# Patient Record
Sex: Male | Born: 1945 | Race: White | Hispanic: No | Smoking: Former smoker
Health system: Southern US, Community
[De-identification: ages and names within clinical notes are randomized; demographics above are authoritative.]

## PROBLEM LIST (undated history)

## (undated) DIAGNOSIS — E785 Hyperlipidemia, unspecified: Secondary | ICD-10-CM

## (undated) DIAGNOSIS — J309 Allergic rhinitis, unspecified: Secondary | ICD-10-CM

## (undated) DIAGNOSIS — Z9981 Dependence on supplemental oxygen: Secondary | ICD-10-CM

## (undated) DIAGNOSIS — M159 Polyosteoarthritis, unspecified: Secondary | ICD-10-CM

## (undated) DIAGNOSIS — I739 Peripheral vascular disease, unspecified: Secondary | ICD-10-CM

## (undated) DIAGNOSIS — J449 Chronic obstructive pulmonary disease, unspecified: Secondary | ICD-10-CM

## (undated) DIAGNOSIS — I10 Essential (primary) hypertension: Secondary | ICD-10-CM

## (undated) DIAGNOSIS — I2510 Atherosclerotic heart disease of native coronary artery without angina pectoris: Secondary | ICD-10-CM

## (undated) DIAGNOSIS — I509 Heart failure, unspecified: Secondary | ICD-10-CM

## (undated) HISTORY — DX: Chronic obstructive pulmonary disease, unspecified: J44.9

## (undated) HISTORY — DX: Polyosteoarthritis, unspecified: M15.9

## (undated) HISTORY — DX: Hyperlipidemia, unspecified: E78.5

## (undated) HISTORY — DX: Atherosclerotic heart disease of native coronary artery without angina pectoris: I25.10

## (undated) HISTORY — DX: Peripheral vascular disease, unspecified: I73.9

## (undated) HISTORY — PX: TONSILLECTOMY AND ADENOIDECTOMY: SUR1326

## (undated) HISTORY — DX: Allergic rhinitis, unspecified: J30.9

## (undated) HISTORY — DX: Essential (primary) hypertension: I10

---

## 1998-04-02 ENCOUNTER — Other Ambulatory Visit: Admission: RE | Admit: 1998-04-02 | Discharge: 1998-04-02 | Payer: Self-pay | Admitting: Internal Medicine

## 1998-07-09 ENCOUNTER — Other Ambulatory Visit: Admission: RE | Admit: 1998-07-09 | Discharge: 1998-07-09 | Payer: Self-pay | Admitting: Family Medicine

## 1998-08-06 ENCOUNTER — Ambulatory Visit (HOSPITAL_COMMUNITY): Admission: RE | Admit: 1998-08-06 | Discharge: 1998-08-06 | Payer: Self-pay | Admitting: Family Medicine

## 2002-04-08 ENCOUNTER — Emergency Department (HOSPITAL_COMMUNITY): Admission: EM | Admit: 2002-04-08 | Discharge: 2002-04-08 | Payer: Self-pay | Admitting: Emergency Medicine

## 2002-05-07 ENCOUNTER — Ambulatory Visit (HOSPITAL_COMMUNITY): Admission: RE | Admit: 2002-05-07 | Discharge: 2002-05-07 | Payer: Self-pay | Admitting: Family Medicine

## 2004-09-07 ENCOUNTER — Ambulatory Visit: Payer: Self-pay | Admitting: *Deleted

## 2004-09-09 ENCOUNTER — Ambulatory Visit: Payer: Self-pay | Admitting: Family Medicine

## 2004-09-09 ENCOUNTER — Ambulatory Visit: Payer: Self-pay | Admitting: *Deleted

## 2004-10-05 ENCOUNTER — Ambulatory Visit: Payer: Self-pay | Admitting: Family Medicine

## 2004-11-03 ENCOUNTER — Ambulatory Visit: Payer: Self-pay | Admitting: Family Medicine

## 2009-10-01 ENCOUNTER — Ambulatory Visit: Payer: Self-pay | Admitting: Family Medicine

## 2009-10-01 DIAGNOSIS — J449 Chronic obstructive pulmonary disease, unspecified: Secondary | ICD-10-CM

## 2009-10-01 DIAGNOSIS — E785 Hyperlipidemia, unspecified: Secondary | ICD-10-CM

## 2009-10-01 DIAGNOSIS — I2510 Atherosclerotic heart disease of native coronary artery without angina pectoris: Secondary | ICD-10-CM

## 2009-10-01 DIAGNOSIS — I779 Disorder of arteries and arterioles, unspecified: Secondary | ICD-10-CM

## 2009-10-01 DIAGNOSIS — J4489 Other specified chronic obstructive pulmonary disease: Secondary | ICD-10-CM

## 2009-10-01 DIAGNOSIS — I10 Essential (primary) hypertension: Secondary | ICD-10-CM

## 2009-10-01 DIAGNOSIS — I739 Peripheral vascular disease, unspecified: Secondary | ICD-10-CM

## 2009-10-01 HISTORY — DX: Chronic obstructive pulmonary disease, unspecified: J44.9

## 2009-10-01 HISTORY — DX: Essential (primary) hypertension: I10

## 2009-10-01 HISTORY — DX: Hyperlipidemia, unspecified: E78.5

## 2009-10-01 HISTORY — DX: Peripheral vascular disease, unspecified: I73.9

## 2009-10-01 HISTORY — DX: Other specified chronic obstructive pulmonary disease: J44.89

## 2009-10-01 HISTORY — DX: Atherosclerotic heart disease of native coronary artery without angina pectoris: I25.10

## 2009-12-24 ENCOUNTER — Ambulatory Visit: Payer: Self-pay | Admitting: Family Medicine

## 2009-12-24 DIAGNOSIS — M159 Polyosteoarthritis, unspecified: Secondary | ICD-10-CM

## 2009-12-24 DIAGNOSIS — J309 Allergic rhinitis, unspecified: Secondary | ICD-10-CM

## 2009-12-24 HISTORY — DX: Polyosteoarthritis, unspecified: M15.9

## 2009-12-24 HISTORY — DX: Allergic rhinitis, unspecified: J30.9

## 2009-12-25 LAB — CONVERTED CEMR LAB
ALT: 22 units/L (ref 0–53)
BUN: 6 mg/dL (ref 6–23)
Bilirubin, Direct: 0.1 mg/dL (ref 0.0–0.3)
CO2: 29 meq/L (ref 19–32)
Chloride: 102 meq/L (ref 96–112)
GFR calc non Af Amer: 89.77 mL/min (ref >60)
Glucose, Bld: 98 mg/dL (ref 70–99)
HDL: 41.6 mg/dL (ref >39.00)
Potassium: 4.1 meq/L (ref 3.5–5.1)
Sodium: 137 meq/L (ref 135–145)
Total Bilirubin: 1 mg/dL (ref 0.3–1.2)
Total CHOL/HDL Ratio: 4
VLDL: 30.6 mg/dL (ref 0.0–40.0)

## 2010-01-28 ENCOUNTER — Ambulatory Visit: Payer: Self-pay | Admitting: Family Medicine

## 2010-01-28 DIAGNOSIS — R05 Cough: Secondary | ICD-10-CM

## 2010-01-30 ENCOUNTER — Ambulatory Visit: Payer: Self-pay | Admitting: Family Medicine

## 2010-02-20 ENCOUNTER — Ambulatory Visit: Payer: Self-pay | Admitting: Family Medicine

## 2010-08-12 ENCOUNTER — Telehealth: Payer: Self-pay | Admitting: Family Medicine

## 2010-12-20 HISTORY — PX: FRACTURE SURGERY: SHX138

## 2010-12-23 ENCOUNTER — Other Ambulatory Visit: Payer: Self-pay | Admitting: Family Medicine

## 2010-12-23 ENCOUNTER — Ambulatory Visit
Admission: RE | Admit: 2010-12-23 | Discharge: 2010-12-23 | Payer: Self-pay | Source: Home / Self Care | Attending: Family Medicine | Admitting: Family Medicine

## 2010-12-23 DIAGNOSIS — R634 Abnormal weight loss: Secondary | ICD-10-CM | POA: Insufficient documentation

## 2010-12-23 LAB — CBC WITH DIFFERENTIAL/PLATELET
Basophils Absolute: 0 10*3/uL (ref 0.0–0.1)
Basophils Relative: 0.5 % (ref 0.0–3.0)
Eosinophils Absolute: 0.3 10*3/uL (ref 0.0–0.7)
Eosinophils Relative: 3.3 % (ref 0.0–5.0)
HCT: 45.6 % (ref 39.0–52.0)
Hemoglobin: 15.7 g/dL (ref 13.0–17.0)
Lymphocytes Relative: 21.8 % (ref 12.0–46.0)
Lymphs Abs: 2.2 10*3/uL (ref 0.7–4.0)
MCHC: 34.3 g/dL (ref 30.0–36.0)
MCV: 97.3 fl (ref 78.0–100.0)
Monocytes Absolute: 1 10*3/uL (ref 0.1–1.0)
Monocytes Relative: 9.8 % (ref 3.0–12.0)
Neutro Abs: 6.5 10*3/uL (ref 1.4–7.7)
Neutrophils Relative %: 64.6 % (ref 43.0–77.0)
Platelets: 185 10*3/uL (ref 150.0–400.0)
RBC: 4.69 Mil/uL (ref 4.22–5.81)
RDW: 13.8 % (ref 11.5–14.6)
WBC: 10.1 10*3/uL (ref 4.5–10.5)

## 2010-12-23 LAB — HEPATIC FUNCTION PANEL
ALT: 16 U/L (ref 0–53)
AST: 24 U/L (ref 0–37)
Albumin: 3.9 g/dL (ref 3.5–5.2)
Alkaline Phosphatase: 75 U/L (ref 39–117)
Bilirubin, Direct: 0.1 mg/dL (ref 0.0–0.3)
Total Bilirubin: 0.9 mg/dL (ref 0.3–1.2)
Total Protein: 7.3 g/dL (ref 6.0–8.3)

## 2010-12-23 LAB — LIPID PANEL
Cholesterol: 138 mg/dL (ref 0–200)
HDL: 49.2 mg/dL (ref >39.00)
LDL Cholesterol: 75 mg/dL (ref 0–99)
Total CHOL/HDL Ratio: 3
Triglycerides: 68 mg/dL (ref 0.0–149.0)
VLDL: 13.6 mg/dL (ref 0.0–40.0)

## 2010-12-23 LAB — CONVERTED CEMR LAB
Blood in Urine, dipstick: NEGATIVE
Cholesterol, target level: 200 mg/dL
HDL goal, serum: 40 mg/dL
Protein, U semiquant: NEGATIVE
Urobilinogen, UA: 0.2
WBC Urine, dipstick: NEGATIVE

## 2010-12-23 LAB — BASIC METABOLIC PANEL
BUN: 8 mg/dL (ref 6–23)
CO2: 29 mEq/L (ref 19–32)
Calcium: 9.3 mg/dL (ref 8.4–10.5)
Chloride: 92 mEq/L — ABNORMAL LOW (ref 96–112)
Creatinine, Ser: 0.8 mg/dL (ref 0.4–1.5)
GFR: 105.57 mL/min (ref >60.00)
Glucose, Bld: 93 mg/dL (ref 70–99)
Potassium: 4.5 mEq/L (ref 3.5–5.1)
Sodium: 130 mEq/L — ABNORMAL LOW (ref 135–145)

## 2010-12-23 LAB — SEDIMENTATION RATE: Sed Rate: 8 mm/hr (ref 0–22)

## 2010-12-23 LAB — TSH: TSH: 1.77 u[IU]/mL (ref 0.35–5.50)

## 2011-01-11 ENCOUNTER — Encounter: Payer: Self-pay | Admitting: Family Medicine

## 2011-01-16 ENCOUNTER — Encounter: Payer: Self-pay | Admitting: Family Medicine

## 2011-01-21 NOTE — Assessment & Plan Note (Signed)
Summary: still coughing/PT RESCD//CCM   Vital Signs:  Patient profile:   65 year old male Temp:     98.6 degrees F oral BP sitting:   138 / 80  (left arm) Cuff size:   regular  Vitals Entered By: Sid Falcon LPN (February 20, 1609 1:38 PM) CC: Bil ears ringing, ongoing cough   History of Present Illness: Patient seen with some persistent cough though moderately improved since last visit. Recent chest x-ray showed COPD changes but no acute changes. There is some clear sinus drainage not improved with Zyrtec. Occasional sneezing. No eye symptoms. No recent nose bleeds. Has used Flonase in the past without improvement. Still smokes. Takes Advair regularly. Has some persistent wheezing and dyspnea with activity which is chronic.  Allergies (verified): No Known Drug Allergies  Past History:  Past Medical History: Last updated: 12/24/2009 Heart disease COPD Hyperlipidemia Hypertension Osteoarthritis multiple joints PMH reviewed for relevance  Review of Systems  The patient denies anorexia, fever, weight loss, chest pain, syncope, peripheral edema, hemoptysis, melena, hematochezia, and severe indigestion/heartburn.    Physical Exam  General:  Well-developed,well-nourished,in no acute distress; alert,appropriate and cooperative throughout examination Ears:  External ear exam shows no significant lesions or deformities.  Otoscopic examination reveals clear canals, tympanic membranes are intact bilaterally without bulging, retraction, inflammation or discharge. Hearing is grossly normal bilaterally. Nose:  clear rhinorrhea noted Mouth:  Oral mucosa and oropharynx without lesions or exudates.  Teeth in good repair. Neck:  No deformities, masses, or tenderness noted. Lungs:  diminished breath sounds throughout. He has some diffuse wheezes. No rales. No retractions. Heart:  normal rate and regular rhythm.   Extremities:  no edema   Impression & Recommendations:  Problem # 1:  COPD  (ICD-496) add Spiriva one inhalation daily-samples given and instructed in use. Again discussed discontinuation of lisinopril but patient is convinced cough is significantly improved. Continue Advair. Reassess one month. His updated medication list for this problem includes:    Ventolin Hfa 108 (90 Base) Mcg/act Aers (Albuterol sulfate) .Marland Kitchen... 1-2 puffs every 4 hours as needed    Advair Diskus 500-50 Mcg/dose Aepb (Fluticasone-salmeterol) ..... One dose by mouth every 12 hours  Complete Medication List: 1)  Ventolin Hfa 108 (90 Base) Mcg/act Aers (Albuterol sulfate) .Marland Kitchen.. 1-2 puffs every 4 hours as needed 2)  Advair Diskus 500-50 Mcg/dose Aepb (Fluticasone-salmeterol) .... One dose by mouth every 12 hours 3)  Simvastatin 80 Mg Tabs (Simvastatin) .... Once daily 4)  Prazosin Hcl 1 Mg Caps (Prazosin hcl) .... Two times a day 5)  Atenolol 50 Mg Tabs (Atenolol) .... Two times a day 6)  Hydrocodone-acetaminophen 7.5-500 Mg Tabs (Hydrocodone-acetaminophen) .... One by mouth q 6 hours as needed severe pain 7)  Fish Oil 300 Mg Caps (Omega-3 fatty acids) .... 2 caps daily 8)  Aspirin 81 Mg Tabs (Aspirin) .... Once daily 9)  Lisinopril-hydrochlorothiazide 10-12.5 Mg Tabs (Lisinopril-hydrochlorothiazide) .... One by mouth once daily 10)  Zyrtec Allergy 10 Mg Tabs (Cetirizine hcl) .... One by mouth at bedtime  Patient Instructions: 1)  takes Spiriva one inhalation daily 2)  Please schedule a follow-up appointment in 1 month.

## 2011-01-21 NOTE — Assessment & Plan Note (Signed)
Summary: FU ON MEDS/NJR   Vital Signs:  Patient profile:   65 year old male Weight:      133 pounds BMI:     21.22 Temp:     98.2 degrees F oral BP sitting:   120 / 70  (left arm) Cuff size:   regular  Vitals Entered By: Sid Falcon LPN (December 23, 2010 11:16 AM)  History of Present Illness: here for medical follow up.  Some weight loss with loss of appetite since last visit. Chronic cough.  No hempoptysis.  Known COPD and chronic dyspnea with exertion.   No pain.  Low grade nausea.  No change in bowel habits. Denies depression. Compliant with all meds.    New problem of rash on penis.  Present for several weeks   Nontender.  Hypertension History:      He denies headache, chest pain, palpitations, orthopnea, PND, peripheral edema, neurologic problems, syncope, and side effects from treatment.  He notes no problems with any antihypertensive medication side effects.        Positive major cardiovascular risk factors include male age 66 years old or older, hyperlipidemia, hypertension, and current tobacco user.  Negative major cardiovascular risk factors include no history of diabetes.        Positive history for target organ damage include ASHD (either angina/prior MI/prior CABG) and peripheral vascular disease.  Further assessment for target organ damage reveals no history of stroke/TIA.    Lipid Management History:      Positive NCEP/ATP III risk factors include male age 40 years old or older, current tobacco user, hypertension, ASHD (either angina/prior MI/prior CABG), and peripheral vascular disease.  Negative NCEP/ATP III risk factors include non-diabetic, no prior stroke/TIA, and no history of aortic aneurysm.      Allergies (verified): No Known Drug Allergies  Past History:  Past Medical History: Last updated: 12/24/2009 Heart disease COPD Hyperlipidemia Hypertension Osteoarthritis multiple joints  Family History: Last updated: 10/01/2009 Family history heart  disease  Social History: Last updated: 10/01/2009 Retired Divorced Current Smoker, one pk/day Alcohol use-yes  Risk Factors: Smoking Status: current (01/28/2010) PMH-FH-SH reviewed for relevance  Review of Systems       The patient complains of anorexia and weight loss.  The patient denies fever, hoarseness, chest pain, syncope, dyspnea on exertion, peripheral edema, prolonged cough, headaches, hemoptysis, abdominal pain, melena, hematochezia, severe indigestion/heartburn, hematuria, depression, and enlarged lymph nodes.    Physical Exam  General:  alert thin cooperatve Head:  normocephalic and atraumatic.   Eyes:  pupils equal, pupils round, and pupils reactive to light.   Ears:  External ear exam shows no significant lesions or deformities.  Otoscopic examination reveals clear canals, tympanic membranes are intact bilaterally without bulging, retraction, inflammation or discharge. Hearing is grossly normal bilaterally. Mouth:  Oral mucosa and oropharynx without lesions or exudates.  Teeth in good repair. Neck:  No deformities, masses, or tenderness noted. Lungs:  decreased breath sounds but no asymmetry.  No rales. Heart:  normal rate and regular rhythm.   Abdomen:  soft, non-tender, normal bowel sounds, no distention, no masses, no hepatomegaly, and no splenomegaly.   Extremities:  no edema. Neurologic:  alert & oriented X3 and cranial nerves II-XII intact.   Cervical Nodes:  No lymphadenopathy noted Psych:  normally interactive, good eye contact, not anxious appearing, and not depressed appearing.     Impression & Recommendations:  Problem # 1:  WEIGHT LOSS (ICD-783.21) ?related to his COPD.  Needs further eval  with labs and CXR. Orders: UA Dipstick w/o Micro (automated)  (81003) T-2 View CXR (71020TC) TLB-BMP (Basic Metabolic Panel-BMET) (80048-METABOL) TLB-CBC Platelet - w/Differential (85025-CBCD) TLB-TSH (Thyroid Stimulating Hormone) (84443-TSH) TLB-Sedimentation  Rate (ESR) (85652-ESR)  Problem # 2:  CAD (ICD-414.00)  His updated medication list for this problem includes:    Prazosin Hcl 1 Mg Caps (Prazosin hcl) .Marland Kitchen..Marland Kitchen Two times a day    Atenolol 50 Mg Tabs (Atenolol) .Marland Kitchen..Marland Kitchen Two times a day    Aspirin 81 Mg Tabs (Aspirin) ..... Once daily    Lisinopril-hydrochlorothiazide 10-12.5 Mg Tabs (Lisinopril-hydrochlorothiazide) ..... One by mouth once daily  Problem # 3:  HYPERTENSION (ICD-401.9)  His updated medication list for this problem includes:    Prazosin Hcl 1 Mg Caps (Prazosin hcl) .Marland Kitchen..Marland Kitchen Two times a day    Atenolol 50 Mg Tabs (Atenolol) .Marland Kitchen..Marland Kitchen Two times a day    Lisinopril-hydrochlorothiazide 10-12.5 Mg Tabs (Lisinopril-hydrochlorothiazide) ..... One by mouth once daily  Problem # 4:  HYPERLIPIDEMIA (ICD-272.4)  His updated medication list for this problem includes:    Simvastatin 80 Mg Tabs (Simvastatin) ..... Once daily  Orders: TLB-Lipid Panel (80061-LIPID) TLB-Hepatic/Liver Function Pnl (80076-HEPATIC)  Problem # 5:  COPD (ICD-496)  His updated medication list for this problem includes:    Proair Hfa 108 (90 Base) Mcg/act Aers (Albuterol sulfate) .Marland Kitchen... 2 puffs q 4 hours as needed cough and wheeze    Advair Diskus 500-50 Mcg/dose Aepb (Fluticasone-salmeterol) ..... One dose by mouth every 12 hours  Complete Medication List: 1)  Proair Hfa 108 (90 Base) Mcg/act Aers (Albuterol sulfate) .... 2 puffs q 4 hours as needed cough and wheeze 2)  Advair Diskus 500-50 Mcg/dose Aepb (Fluticasone-salmeterol) .... One dose by mouth every 12 hours 3)  Simvastatin 80 Mg Tabs (Simvastatin) .... Once daily 4)  Prazosin Hcl 1 Mg Caps (Prazosin hcl) .... Two times a day 5)  Atenolol 50 Mg Tabs (Atenolol) .... Two times a day 6)  Hydrocodone-acetaminophen 7.5-500 Mg Tabs (Hydrocodone-acetaminophen) .... One by mouth q 6 hours as needed severe pain 7)  Fish Oil 300 Mg Caps (Omega-3 fatty acids) .... 2 caps daily 8)  Aspirin 81 Mg Tabs (Aspirin) .... Once  daily 9)  Lisinopril-hydrochlorothiazide 10-12.5 Mg Tabs (Lisinopril-hydrochlorothiazide) .... One by mouth once daily 10)  Zyrtec Allergy 10 Mg Tabs (Cetirizine hcl) .... One by mouth at bedtime 11)  Triamcinolone Acetonide 0.1 % Crea (Triamcinolone acetonide) .... Apply to affected rash two times a day for 2 weeks.  Hypertension Assessment/Plan:      The patient's hypertensive risk group is category C: Target organ damage and/or diabetes.  Today's blood pressure is 120/70.    Lipid Assessment/Plan:      Based on NCEP/ATP III, the patient's risk factor category is "history of coronary disease, peripheral vascular disease, cerebrovascular disease, or aortic aneurysm along with either diabetes, current smoker, or LDL > 130 plus HDL < 40 plus triglycerides > 200".  The patient's lipid goals are as follows: Total cholesterol goal is 200; LDL cholesterol goal is 70; HDL cholesterol goal is 40; Triglyceride goal is 150.    Patient Instructions: 1)  Please schedule a follow-up appointment in 1 month.  Prescriptions: LISINOPRIL-HYDROCHLOROTHIAZIDE 10-12.5 MG TABS (LISINOPRIL-HYDROCHLOROTHIAZIDE) one by mouth once daily  #90 x 3   Entered and Authorized by:   Evelena Peat MD   Signed by:   Evelena Peat MD on 12/23/2010   Method used:   Electronically to        [facility]  [address] [phone]* (retail)       [phone]       [place], [place]  [address]       Ph: [phone]       Fax: [phone]   RxID:   [id number] ATENOLOL 50 MG TABS (ATENOLOL) two times a day  #180 x 3   Entered and Authorized by:   Evelena Peat MD   Signed by:   Evelena Peat MD on 12/23/2010   Method used:   Electronically to        [facility]. [phone]* (retail)       [phone] [address]       [place], [place]  [address]       Ph: [phone]       Fax: [phone]   RxID:   [id number] PRAZOSIN HCL 1 MG CAPS (PRAZOSIN HCL) two times a  day  #180 x 3   Entered and Authorized by:   Evelena Peat MD   Signed by:   Evelena Peat MD on 12/23/2010   Method used:   Electronically to        [facility]. [phone]* (retail)       [phone] [address]       [place], [place]  [address]       Ph: [phone]       Fax: [phone]   RxID:   [id number] SIMVASTATIN 80 MG TABS (SIMVASTATIN) once daily  #90 x 3   Entered and Authorized by:   Evelena Peat MD   Signed by:   Evelena Peat MD on 12/23/2010   Method used:   Electronically to        [facility]. [phone]* (retail)       [phone] [address]       [place], [place]  [address]       Ph: [phone]       Fax: [phone]   RxID:   [id number] ADVAIR DISKUS 500-50 MCG/DOSE AEPB (FLUTICASONE-SALMETEROL) one dose by mouth every 12 hours  #3 x 3   Entered and Authorized by:   Evelena Peat MD   Signed by:   Evelena Peat MD on 12/23/2010   Method used:   Electronically to        [facility]. [phone]* (retail)       [phone]       [place], [place]  [address]       Ph: [phone]       Fax: [phone]   RxID:   [id number] PROAIR HFA 108 (90 BASE) MCG/ACT AERS (ALBUTEROL SULFATE) 2 puffs q 4 hours as needed cough and wheeze  #1 x 11   Entered and Authorized by:   Evelena Peat MD   Signed by:   Evelena Peat MD on 12/23/2010   Method used:   Electronically to        [facility]. [phone]* (retail)       [phone] [address]       [place], [place]  [address]       Ph: [phone]       Fax: [phone]   RxID:   [id number] HYDROCODONE-ACETAMINOPHEN 7.5-500  MG TABS (HYDROCODONE-ACETAMINOPHEN) one by mouth q 6 hours as needed severe pain  #60 x 0   Entered and Authorized by:   Evelena Peat MD   Signed by:   Evelena Peat MD on 12/23/2010   Method used:   Print then Give to Patient   RxID:    [id number] TRIAMCINOLONE ACETONIDE 0.1 % CREA (TRIAMCINOLONE ACETONIDE) apply to affected rash two times a day for 2 weeks.  #15 gm x 1   Entered and Authorized by:   Evelena Peat MD   Signed by:   Evelena Peat MD on 12/23/2010   Method used:   Print then Give to Patient   RxID:   [id number]    Orders Added: 1)  UA Dipstick w/o Micro (automated)  [81003] 2)  T-2 View CXR [71020TC] 3)  TLB-Lipid Panel [80061-LIPID] 4)  TLB-Hepatic/Liver Function Pnl [80076-HEPATIC] 5)  TLB-BMP (Basic Metabolic Panel-BMET) [80048-METABOL] 6)  TLB-CBC Platelet - w/Differential [85025-CBCD] 7)  TLB-TSH (Thyroid Stimulating Hormone) [84443-TSH] 8)  TLB-Sedimentation Rate (ESR) [85652-ESR] 9)  Est. Patient Level IV [95[address]]    Laboratory Results   Urine Tests    Routine Urinalysis   Color: yellow Appearance: Clear Glucose: negative   (Normal Range: Negative) Bilirubin: negative   (Normal Range: Negative) Ketone: negative   (Normal Range: Negative) Spec. Gravity: 1.015   (Normal Range: 1.003-1.035) Blood: negative   (Normal Range: Negative) pH: 7.5   (Normal Range: 5.0-8.0) Protein: negative   (Normal Range: Negative) Urobilinogen: 0.2   (Normal Range: 0-1) Nitrite: negative   (Normal Range: Negative) Leukocyte Esterace: negative   (Normal Range: Negative)    Comments: Rita Ohara  December 23, 2010 3:35 PM

## 2011-01-21 NOTE — Assessment & Plan Note (Signed)
Summary: ROA/FUP/RCD   Vital Signs:  Patient profile:   65 year old male Weight:      149 pounds O2 Sat:      94 % on Room air Temp:     98.7 degrees F oral Pulse rate:   73 / minute Pulse rhythm:   regular BP sitting:   150 / 88  (left arm) Cuff size:   regular  Vitals Entered By: Sid Falcon LPN (January 28, 2010 1:13 PM)  O2 Flow:  Room air CC: ROV follow-up, coughing   History of Present Illness: Patient seen today for the following items.  Follow up hypertension. Recent poor control. Add low-dose lisinopril. Patient has a chronic cough which is unchanged. Was coughing well before lisinopril added. No orthostatic symptoms. Not checking blood pressure at home.  Hyperlipidemia. Recent lipids reveal fairly good control. These are reviewed with patient today. No side effect from simvastatin.  Persistent cough for several months. History of COPD. Cough productive of clear sputum. Denies any pleuritic pain, hemoptysis, appetite changes, or weight changes. Chronic dyspnea on exertion unchanged. Is aware of some wheezing off and on. Using Advair regularly  Preventive Screening-Counseling & Management  Alcohol-Tobacco     Smoking Status: current  Allergies (verified): No Known Drug Allergies  Past History:  Past Medical History: Last updated: 12/24/2009 Heart disease COPD Hyperlipidemia Hypertension Osteoarthritis multiple joints  Social History: Last updated: 10/01/2009 Retired Divorced Current Smoker, one pk/day Alcohol use-yes PMH-FH-SH reviewed for relevance  Review of Systems       The patient complains of dyspnea on exertion and prolonged cough.  The patient denies anorexia, fever, weight loss, hoarseness, chest pain, syncope, peripheral edema, headaches, hemoptysis, abdominal pain, melena, hematochezia, and severe indigestion/heartburn.    Physical Exam  General:  Well-developed,well-nourished,in no acute distress; alert,appropriate and cooperative  throughout examination Ears:  External ear exam shows no significant lesions or deformities.  Otoscopic examination reveals clear canals, tympanic membranes are intact bilaterally without bulging, retraction, inflammation or discharge. Hearing is grossly normal bilaterally. Mouth:  Oral mucosa and oropharynx without lesions or exudates.  Teeth in good repair. Neck:  No deformities, masses, or tenderness noted. Lungs:  somewhat diminishedd breath sounds throughout. Faint wheezes. No rales. Symmetric breath sounds. Heart:  normal rate and regular rhythm.   Extremities:  no edema   Impression & Recommendations:  Problem # 1:  HYPERTENSION (ICD-401.9) Assessment Improved improved but not quite to goal. Change to lisinopril HCTZ His updated medication list for this problem includes:    Prazosin Hcl 1 Mg Caps (Prazosin hcl) .Marland Kitchen..Marland Kitchen Two times a day    Atenolol 50 Mg Tabs (Atenolol) .Marland Kitchen..Marland Kitchen Two times a day    Lisinopril-hydrochlorothiazide 10-12.5 Mg Tabs (Lisinopril-hydrochlorothiazide) ..... One by mouth once daily  Problem # 2:  COUGH, CHRONIC (ICD-786.2) Assessment: Deteriorated  Probably related to chronic bronchitis. Obtain chest x-ray with his smoking status.start Avelox 400 mg daily for 7 days.  Consider d/c ACE if cough worsens/persists.  Orders: T-2 View CXR (71020TC)  Problem # 3:  HYPERLIPIDEMIA (ICD-272.4) Assessment: Unchanged  His updated medication list for this problem includes:    Simvastatin 80 Mg Tabs (Simvastatin) ..... Once daily  Complete Medication List: 1)  Ventolin Hfa 108 (90 Base) Mcg/act Aers (Albuterol sulfate) .Marland Kitchen.. 1-2 puffs every 4 hours as needed 2)  Advair Diskus 500-50 Mcg/dose Aepb (Fluticasone-salmeterol) .... One dose by mouth every 12 hours 3)  Simvastatin 80 Mg Tabs (Simvastatin) .... Once daily 4)  Prazosin Hcl 1  Mg Caps (Prazosin hcl) .... Two times a day 5)  Atenolol 50 Mg Tabs (Atenolol) .... Two times a day 6)  Hydrocodone-acetaminophen 7.5-500  Mg Tabs (Hydrocodone-acetaminophen) .... One by mouth q 6 hours as needed severe pain 7)  Fish Oil 300 Mg Caps (Omega-3 fatty acids) .... 2 caps daily 8)  Aspirin 81 Mg Tabs (Aspirin) .... Once daily 9)  Lisinopril-hydrochlorothiazide 10-12.5 Mg Tabs (Lisinopril-hydrochlorothiazide) .... One by mouth once daily 10)  Zyrtec Allergy 10 Mg Tabs (Cetirizine hcl) .... One by mouth at bedtime  Patient Instructions: 1)  Please schedule a follow-up appointment in 2 months.  2)  call or be in touch in 2 weeks if cough is not improving following antibiotics Prescriptions: LISINOPRIL-HYDROCHLOROTHIAZIDE 10-12.5 MG TABS (LISINOPRIL-HYDROCHLOROTHIAZIDE) one by mouth once daily  #30 x 11   Entered and Authorized by:   Evelena Peat MD   Signed by:   Evelena Peat MD on 01/28/2010   Method used:   Electronically to        [facility]. [phone]* (retail)       [phone]       [place], [place]  [address]       Ph: [phone]       Fax: [phone]   RxID:   [id number]

## 2011-01-21 NOTE — Assessment & Plan Note (Signed)
Summary: 3 mo rov/mm   Vital Signs:  Patient profile:   65 year old male Weight:      150 pounds Temp:     98.7 degrees F oral BP sitting:   160 / 80  (left arm) Cuff size:   regular  Vitals Entered By: Sid Falcon LPN (December 24, 2009 1:21 PM)  History of Present Illness: Patient here for several following items.  He relates postnasal drip and clear nasal mucus discharge for several months. Occasional sneezing. No eye symptoms. No clear precipitants. Has not tried any antihistamines.  Ongoing cigarette use. Has almost daily cough which is unchanged. Frequent clear mucous early mornings. He uses Advair regularly. Still smoking over half-pack cigarettes per day. No interest in quitting. P.r.n. use of Ventolin. Denies any appetite or weight changes no hemoptysis.  Patient hyperlipidemia and due for lipids. Compliant with medications. Poor compliance with lifestyle. He has refused cardiac workup in the past for exertional shortness of breath and occasional chest symptoms.  Patient request increase milligrams a pain medication which takes infrequently for flareups of severe osteoarthritis.  Allergies (verified): No Known Drug Allergies  Past History:  Social History: Last updated: 10/01/2009 Retired Divorced Current Smoker, one pk/day Alcohol use-yes  Past Medical History: Heart disease COPD Hyperlipidemia Hypertension Osteoarthritis multiple joints  Review of Systems       The patient complains of dyspnea on exertion.  The patient denies anorexia, fever, weight loss, chest pain, syncope, peripheral edema, headaches, hemoptysis, abdominal pain, melena, and hematochezia.    Physical Exam  General:  Well-developed,well-nourished,in no acute distress; alert,appropriate and cooperative throughout examination Eyes:  No corneal or conjunctival inflammation noted. EOMI. Perrla. Funduscopic exam benign, without hemorrhages, exudates or papilledema. Vision grossly  normal. Ears:  External ear exam shows no significant lesions or deformities.  Otoscopic examination reveals clear canals, tympanic membranes are intact bilaterally without bulging, retraction, inflammation or discharge. Hearing is grossly normal bilaterally. Nose:  clear mucus otherwise clear Mouth:  Oral mucosa and oropharynx without lesions or exudates.  Teeth in good repair. Neck:  No deformities, masses, or tenderness noted. Lungs:  diminished breath sounds throughout with some scattered wheezes. No rales Heart:  normal rate, regular rhythm, and no murmur.   Extremities:  No clubbing, cyanosis, edema, or deformity noted with normal full range of motion of all joints.     Impression & Recommendations:  Problem # 1:  ALLERGIC RHINITIS (ICD-477.9) try over-the-counter Claritin or Zyrtec.  Problem # 2:  COPD (ICD-496) Assessment: New low motivation to quit smoking. Encouraged to quit. May benefit from spiRiva addition to medications if cost not an issue. His updated medication list for this problem includes:    Ventolin Hfa 108 (90 Base) Mcg/act Aers (Albuterol sulfate) .Marland Kitchen... 1-2 puffs every 4 hours as needed    Advair Diskus 500-50 Mcg/dose Aepb (Fluticasone-salmeterol) ..... One dose by mouth every 12 hours  Problem # 3:  HYPERTENSION (ICD-401.9)  poorly controlled. Add low-dose ACE inhibitor His updated medication list for this problem includes:    Prazosin Hcl 1 Mg Caps (Prazosin hcl) .Marland Kitchen..Marland Kitchen Two times a day    Atenolol 50 Mg Tabs (Atenolol) .Marland Kitchen..Marland Kitchen Two times a day    Lisinopril 10 Mg Tabs (Lisinopril) ..... One by mouth once daily  Orders: Venipuncture (16109) TLB-BMP (Basic Metabolic Panel-BMET) (80048-METABOL)  Problem # 4:  HYPERLIPIDEMIA (ICD-272.4)  His updated medication list for this problem includes:    Simvastatin 80 Mg Tabs (Simvastatin) ..... 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 One by mouth q 6 hours as needed severe pain    Aspirin 81 Mg Tabs (Aspirin) ..... Once daily  Complete Medication List: 1)  Ventolin Hfa 108 (90 Base) Mcg/act Aers (Albuterol sulfate) .Marland Kitchen.. 1-2 puffs every 4 hours as needed 2)  Advair Diskus 500-50 Mcg/dose Aepb (Fluticasone-salmeterol) .... One dose by mouth every 12 hours 3)  Simvastatin 80 Mg Tabs (Simvastatin) .... Once daily 4)  Prazosin Hcl 1 Mg Caps (Prazosin hcl) .... Two times a day 5)  Atenolol 50 Mg Tabs (Atenolol) .... Two times a day 6)  Hydrocodone-acetaminophen 7.5-500 Mg Tabs (Hydrocodone-acetaminophen) .... One by mouth q 6 hours as needed severe pain 7)  Fish Oil 300 Mg Caps (Omega-3 fatty acids) .... 2 caps daily 8)  Aspirin 81 Mg Tabs (Aspirin) .... Once daily 9)  Lisinopril 10 Mg Tabs (Lisinopril) .... One by mouth once daily  Patient Instructions: 1)  Try over-the-counter Claritin or Zyrtec for symptomatic relief of nasal congestion 2)  Please schedule a follow-up appointment in 1 month.  Prescriptions: VENTOLIN HFA 108 (90 BASE) MCG/ACT AERS (ALBUTEROL SULFATE) 1-2 puffs every 4 hours as needed  #1 x 3   Entered and Authorized by:   Evelena Peat MD   Signed by:   Evelena Peat MD on 12/24/2009   Method used:   Electronically to        [facility]. [phone]* (retail)       [phone]       [place], [place]  [address]       Ph: [phone]       Fax: [phone]   RxID:   [id number] ATENOLOL 50 MG TABS (ATENOLOL) two times a day  #180 x 3   Entered and Authorized by:   Evelena Peat MD   Signed by:   Evelena Peat MD on 12/24/2009   Method used:   Electronically to        [facility]. [phone]* (retail)       [phone] [address]       [place], [place]  [address]       Ph: [phone]       Fax: [phone]   RxID:   [id number] PRAZOSIN HCL 1 MG CAPS (PRAZOSIN HCL) two times a day  #180 x 3   Entered and Authorized by:   Evelena Peat MD   Signed by:   Evelena Peat MD on 12/24/2009   Method used:   Electronically to        [facility]. [phone]* (retail)       [phone] [address]       [place], [place]  [address]       Ph: [phone]       Fax: [phone]   RxID:   [id number] SIMVASTATIN 80 MG TABS (SIMVASTATIN) once daily  #90 x 3   Entered and Authorized by:   Evelena Peat MD   Signed by:   Evelena Peat MD on 12/24/2009   Method used:   Electronically to  [facility]. [phone]* (retail)       [phone] [address]       [place], [place]  [address]       Ph: [phone]       Fax: [phone]   RxID:   [id number] ADVAIR DISKUS 500-50 MCG/DOSE AEPB (FLUTICASONE-SALMETEROL) one dose by mouth every 12 hours  #3 x 3   Entered and Authorized by:   Evelena Peat MD   Signed by:   Evelena Peat MD on 12/24/2009   Method used:   Electronically to        [facility]. [phone]* (retail)       [phone] [address]       [place], [place]  [address]       Ph: [phone]       Fax: [phone]   RxID:   [id number] HYDROCODONE-ACETAMINOPHEN 7.5-500 MG TABS (HYDROCODONE-ACETAMINOPHEN) one by mouth q 6 hours as needed severe pain  #60 x 0   Entered and Authorized by:   Evelena Peat MD   Signed by:   Evelena Peat MD on 12/24/2009   Method used:   Print then Give to Patient   RxID:   [id number] LISINOPRIL 10 MG TABS (LISINOPRIL) one by mouth once daily  #30 x 5   Entered and Authorized by:   Evelena Peat MD   Signed by:   Evelena Peat MD on 12/24/2009   Method used:   Electronically to         [facility]. [phone]* (retail)       [phone] [address]       [place], [place]  [address]       Ph: [phone]       Fax: [phone]   RxID:   [id number]

## 2011-01-21 NOTE — Progress Notes (Signed)
Summary: rx  [employer] patient assistance program  Phone Note Call from Patient Call back at Home Phone [phone]   Caller: Patient Call For: Douglas Peat MD Summary of Call: pt needs needs advair 500-50 micrograms #3 with 3 refills pt will pick up rx please call ot. Initial call taken by: Heron Sabins,  August 12, 2010 2:48 PM  Follow-up for Phone Call        doesn't he want [place] to send in to pharmacy? Follow-up by: Douglas Peat MD,  August 12, 2010 4:08 PM  Additional Follow-up for Phone Call Additional follow up Details #1::        No he will pick up rx to seen to pt assistant program       Additional Follow-up by: Heron Sabins,  August 12, 2010 4:34 PM    Additional Follow-up for Phone Call Additional follow up Details #2::    will refill Follow-up by: Douglas Peat MD,  August 13, 2010 8:09 AM  Additional Follow-up for Phone Call Additional follow up Details #3:: Details for Additional Follow-up Action Taken: Pt informed ready for pick-up on VM Additional Follow-up by: Sid Falcon LPN,  August 13, 2010 8:51 AM  Prescriptions: ADVAIR DISKUS 500-50 MCG/DOSE AEPB (FLUTICASONE-SALMETEROL) one dose by mouth every 12 hours  #3 x 3   Entered and Authorized by:   Douglas Peat MD   Signed by:   Douglas Peat MD on 08/13/2010   Method used:   Print then Give to Patient   RxID:   [id number]

## 2011-01-25 ENCOUNTER — Encounter: Payer: Self-pay | Admitting: Family Medicine

## 2011-01-25 ENCOUNTER — Ambulatory Visit (INDEPENDENT_AMBULATORY_CARE_PROVIDER_SITE_OTHER): Payer: PRIVATE HEALTH INSURANCE | Admitting: Family Medicine

## 2011-01-25 DIAGNOSIS — E871 Hypo-osmolality and hyponatremia: Secondary | ICD-10-CM

## 2011-01-25 DIAGNOSIS — R634 Abnormal weight loss: Secondary | ICD-10-CM

## 2011-01-25 DIAGNOSIS — I10 Essential (primary) hypertension: Secondary | ICD-10-CM

## 2011-01-25 LAB — BASIC METABOLIC PANEL
CO2: 32 mEq/L (ref 19–32)
GFR: 110.43 mL/min (ref >60.00)
Glucose, Bld: 95 mg/dL (ref 70–99)
Potassium: 4.1 mEq/L (ref 3.5–5.1)
Sodium: 129 mEq/L — ABNORMAL LOW (ref 135–145)

## 2011-01-25 NOTE — Progress Notes (Signed)
  Subjective:    Patient ID: Douglas Mann, male    DOB: 1946-01-11, 64 y.o.   MRN: [id number]  HPI Patient seen for medical followup. Refer to prior note which was reviewed in detail. Patient had presented with weight loss. Recent studies included chest x-ray, sedimentation rate, thyroid studies, chemistries, Hepatic panel, CBC, and lipid panel. Labs were significant for sodium 130. Patient takes combination ACE inhibitor diuretic for hypertension.  Patient's weight has stabilized. Appetite is slightly improved since last visit. He is reluctant to do further studies at this time. Denies any depressive symptoms.  Hypertension treated with atenolol and lisinopril hydrochlorothiazide. Also takes Minipress 1 mg daily Compliant with all blood pressure medications.  Ongoing nicotine use. No motivation to quit smoking. Denies hemoptysis. COPD treated with Advair and albuterol as needed. He was previously on Spiriva but did not see improvement stopped this on his own   Review of Systems Patient denies a headaches, dysphagia, change in pulmonary status, hemoptysis, chest pain, abdominal pain, skin rash, change in stool habits, or , Change in urine habits.     Objective:   Physical Exam Patient is alert in no distress. Oropharynx no lesions  Eardrums normal  Pupils equal reactive to light  Neck no mass  Chest diminished breath sounds throughout. No rales or wheezes  Heart regular rhythm rate  Abdomen soft nontender without mass. No organomegaly  Extremities no edema  Neuro alert oriented x3. Cranial nerves all intact. Gait normal        Assessment & Plan:  #1  weight loss. This appears to stabilized. Recent studies unremarkable. Possibly related to advanced COPD. Patient reluctant to engage in further studies #2  recent hyponatremia probably related to diuretic use. Reassess basic metabolic panel #3 hypertension. Stable. No change in meds at this time

## 2011-01-25 NOTE — Patient Instructions (Signed)
Keep an eye on your weight. Follow up if still losing any weight over the next few weeks or months.

## 2011-01-26 ENCOUNTER — Telehealth: Payer: Self-pay | Admitting: *Deleted

## 2011-01-26 DIAGNOSIS — I10 Essential (primary) hypertension: Secondary | ICD-10-CM

## 2011-01-26 NOTE — Telephone Encounter (Signed)
Message copied by Sid Falcon on Tue Jan 26, 2011  4:23 PM ------      Message from: Trinna Post      Created: Tue Jan 26, 2011 12:39 PM       Sodium is low but stable. I would stop his lisinopril HCTZ and start lisinopril 20 mg daily and reassess BMP in 2 months.

## 2011-01-26 NOTE — Telephone Encounter (Signed)
LMTCB, needs med change and return labs

## 2011-01-27 MED ORDER — LISINOPRIL 20 MG PO TABS: 20.0000 mg | ORAL_TABLET | Freq: Every day | ORAL | Status: DC

## 2011-01-27 NOTE — Telephone Encounter (Signed)
Pt informed, he will D/C Lisinopril - HCTZ, new Lisinopril 20 sent to [facility]

## 2011-01-28 ENCOUNTER — Telehealth: Payer: Self-pay | Admitting: *Deleted

## 2011-01-28 NOTE — Telephone Encounter (Signed)
Pt informed, new Rx sent to pt pharmacy, labs scheduled

## 2011-01-28 NOTE — Telephone Encounter (Signed)
Message copied by Sid Falcon on Thu Jan 28, 2011  6:12 PM ------      Message from: Trinna Post      Created: Tue Jan 26, 2011 12:39 PM       Sodium is low but stable. I would stop his lisinopril HCTZ and start lisinopril 20 mg daily and reassess BMP in 2 months.

## 2011-07-26 ENCOUNTER — Ambulatory Visit (INDEPENDENT_AMBULATORY_CARE_PROVIDER_SITE_OTHER): Payer: PRIVATE HEALTH INSURANCE | Admitting: Family Medicine

## 2011-07-26 ENCOUNTER — Encounter: Payer: Self-pay | Admitting: Family Medicine

## 2011-07-26 VITALS — BP 150/80 | Temp 98.7°F | Wt 127.0 lb

## 2011-07-26 DIAGNOSIS — J449 Chronic obstructive pulmonary disease, unspecified: Secondary | ICD-10-CM

## 2011-07-26 DIAGNOSIS — I10 Essential (primary) hypertension: Secondary | ICD-10-CM

## 2011-07-26 MED ORDER — PREDNISONE 10 MG PO TABS: ORAL_TABLET | ORAL | Status: AC

## 2011-07-26 MED ORDER — ALBUTEROL SULFATE HFA 108 (90 BASE) MCG/ACT IN AERS: 2.0000 | INHALATION_SPRAY | Freq: Four times a day (QID) | RESPIRATORY_TRACT | Status: DC | PRN

## 2011-07-26 NOTE — Progress Notes (Signed)
  Subjective:    Patient ID: Douglas Mann, male    DOB: [birth year], 65 y.o.   MRN: [id number]  HPI Patient seen with progressive dyspnea over many months. Ongoing nicotine use. Known emphysema. No dyspnea at rest. More with ambulation. Takes Advair twice daily and Ventolin as needed. Previously used Spiriva but did not think this helped. He's had some gradual weight loss for several years.  Denies any productive cough, hemoptysis, abdominal pain, dysuria, or any stool changes.  chest x-ray last January showed emphysema changes. Several labs including hepatic function, chemistries, CBC, sedimentation rate, TSH which were normal. Pneumovax 2010.  Continue to smoke one pack cigarettes per day.  Hypertension and hyperlipidemia.  Compliant with all meds.  Past Medical History  Diagnosis Date  . HYPERLIPIDEMIA 10/01/2009  . HYPERTENSION 10/01/2009  . CAD 10/01/2009  . PVD 10/01/2009  . ALLERGIC RHINITIS 12/24/2009  . COPD 10/01/2009  . 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 COUGH, CHRONIC 01/28/2010   No past surgical history on file.  reports that he has been smoking Cigarettes.  He has a 1 pack-year smoking history. He does not have any smokeless tobacco history on file. He reports that he drinks alcohol. His drug history not on file. family history includes Heart disease in his other. No Known Allergies    Review of Systems  Constitutional: Positive for appetite change, fatigue and unexpected weight change. Negative for fever and chills.  HENT: Negative for sore throat and trouble swallowing.   Respiratory: Positive for cough, shortness of breath and wheezing. Negative for chest tightness.   Cardiovascular: Negative for chest pain, palpitations and leg swelling.  Gastrointestinal: Negative for vomiting, abdominal pain and diarrhea.  Genitourinary: Negative for dysuria.  Neurological: Negative for dizziness.  Hematological: Negative for adenopathy.       Objective:   Physical Exam  Constitutional: He is oriented to person, place, and time.  HENT:  Head: Normocephalic and atraumatic.  Right Ear: External ear normal.  Left Ear: External ear normal.  Mouth/Throat: Oropharynx is clear and moist.  Eyes: Pupils are equal, round, and reactive to light.  Neck: Neck supple. No thyromegaly present.  Cardiovascular: Normal rate and regular rhythm.   Pulmonary/Chest:       Somewhat diminished breath sounds throughout. He had diffuse wheezes. No rales  Musculoskeletal: He exhibits no edema.  Neurological: He is alert and oriented to person, place, and time.          Assessment & Plan:  #1 chronic obstructive pulmonary disease. Patient is again encouraged to stop smoking. He has scaled back but refuses to quit. Check home overnight pulse oximetry. Suspect he'll qualify for home oxygen. Refill Ventolin. Continue Advair. Discussed Spiriva which he did not see help with previously.  Recommend pulmonary referral and he wishes to wait at this time. Prednisone taper #2 weight loss. Possibly related to #1. Recent labs as above unremarkable.

## 2011-09-02 ENCOUNTER — Other Ambulatory Visit: Payer: Self-pay | Admitting: Family Medicine

## 2011-09-06 ENCOUNTER — Inpatient Hospital Stay (HOSPITAL_COMMUNITY)
Admission: EM | Admit: 2011-09-06 | Discharge: 2011-09-15 | DRG: 492 | Disposition: A | Discharge status: Home Health | Payer: No Typology Code available for payment source | Attending: General Surgery | Admitting: General Surgery

## 2011-09-06 ENCOUNTER — Emergency Department (HOSPITAL_COMMUNITY): Payer: No Typology Code available for payment source

## 2011-09-06 ENCOUNTER — Inpatient Hospital Stay (HOSPITAL_COMMUNITY): Payer: No Typology Code available for payment source

## 2011-09-06 DIAGNOSIS — F17200 Nicotine dependence, unspecified, uncomplicated: Secondary | ICD-10-CM | POA: Diagnosis present

## 2011-09-06 DIAGNOSIS — F1010 Alcohol abuse, uncomplicated: Secondary | ICD-10-CM | POA: Diagnosis present

## 2011-09-06 DIAGNOSIS — J4489 Other specified chronic obstructive pulmonary disease: Secondary | ICD-10-CM | POA: Diagnosis present

## 2011-09-06 DIAGNOSIS — I2510 Atherosclerotic heart disease of native coronary artery without angina pectoris: Secondary | ICD-10-CM | POA: Diagnosis present

## 2011-09-06 DIAGNOSIS — S82209B Unspecified fracture of shaft of unspecified tibia, initial encounter for open fracture type I or II: Secondary | ICD-10-CM

## 2011-09-06 DIAGNOSIS — I252 Old myocardial infarction: Secondary | ICD-10-CM

## 2011-09-06 DIAGNOSIS — Y92410 Unspecified street and highway as the place of occurrence of the external cause: Secondary | ICD-10-CM

## 2011-09-06 DIAGNOSIS — S82839B Other fracture of upper and lower end of unspecified fibula, initial encounter for open fracture type I or II: Secondary | ICD-10-CM | POA: Diagnosis present

## 2011-09-06 DIAGNOSIS — D62 Acute posthemorrhagic anemia: Secondary | ICD-10-CM | POA: Diagnosis not present

## 2011-09-06 DIAGNOSIS — Z9861 Coronary angioplasty status: Secondary | ICD-10-CM

## 2011-09-06 DIAGNOSIS — S82409B Unspecified fracture of shaft of unspecified fibula, initial encounter for open fracture type I or II: Secondary | ICD-10-CM

## 2011-09-06 DIAGNOSIS — Z7982 Long term (current) use of aspirin: Secondary | ICD-10-CM

## 2011-09-06 DIAGNOSIS — J449 Chronic obstructive pulmonary disease, unspecified: Secondary | ICD-10-CM | POA: Diagnosis present

## 2011-09-06 DIAGNOSIS — Z711 Person with feared health complaint in whom no diagnosis is made: Secondary | ICD-10-CM

## 2011-09-06 DIAGNOSIS — T07XXXA Unspecified multiple injuries, initial encounter: Secondary | ICD-10-CM

## 2011-09-06 DIAGNOSIS — Z79899 Other long term (current) drug therapy: Secondary | ICD-10-CM

## 2011-09-06 DIAGNOSIS — K219 Gastro-esophageal reflux disease without esophagitis: Secondary | ICD-10-CM | POA: Diagnosis present

## 2011-09-06 DIAGNOSIS — E785 Hyperlipidemia, unspecified: Secondary | ICD-10-CM | POA: Diagnosis present

## 2011-09-06 DIAGNOSIS — IMO0002 Reserved for concepts with insufficient information to code with codable children: Secondary | ICD-10-CM | POA: Diagnosis present

## 2011-09-06 DIAGNOSIS — Z7902 Long term (current) use of antithrombotics/antiplatelets: Secondary | ICD-10-CM

## 2011-09-06 DIAGNOSIS — I10 Essential (primary) hypertension: Secondary | ICD-10-CM | POA: Diagnosis present

## 2011-09-06 DIAGNOSIS — E871 Hypo-osmolality and hyponatremia: Secondary | ICD-10-CM | POA: Diagnosis not present

## 2011-09-06 DIAGNOSIS — I214 Non-ST elevation (NSTEMI) myocardial infarction: Secondary | ICD-10-CM | POA: Diagnosis not present

## 2011-09-06 DIAGNOSIS — Z96649 Presence of unspecified artificial hip joint: Secondary | ICD-10-CM

## 2011-09-06 LAB — COMPREHENSIVE METABOLIC PANEL
ALT: 13 U/L (ref 0–53)
AST: 18 U/L (ref 0–37)
Albumin: 3.5 g/dL (ref 3.5–5.2)
Alkaline Phosphatase: 86 U/L (ref 39–117)
CO2: 30 mEq/L (ref 19–32)
Chloride: 93 mEq/L — ABNORMAL LOW (ref 96–112)
GFR calc non Af Amer: >60 mL/min (ref >60)
Potassium: 4.3 mEq/L (ref 3.5–5.1)
Sodium: 130 mEq/L — ABNORMAL LOW (ref 135–145)
Total Bilirubin: 0.3 mg/dL (ref 0.3–1.2)

## 2011-09-06 LAB — DIFFERENTIAL
Basophils Absolute: 0 10*3/uL (ref 0.0–0.1)
Eosinophils Relative: 3 % (ref 0–5)
Lymphocytes Relative: 25 % (ref 12–46)
Lymphs Abs: 2.3 10*3/uL (ref 0.7–4.0)
Neutro Abs: 6 10*3/uL (ref 1.7–7.7)

## 2011-09-06 LAB — CBC
HCT: 41.3 % (ref 39.0–52.0)
Hemoglobin: 14.5 g/dL (ref 13.0–17.0)
MCV: 92.4 fL (ref 78.0–100.0)
RBC: 4.47 MIL/uL (ref 4.22–5.81)
RDW: 13.1 % (ref 11.5–15.5)
WBC: 9.5 10*3/uL (ref 4.0–10.5)

## 2011-09-06 LAB — TYPE AND SCREEN: Antibody Screen: NEGATIVE

## 2011-09-06 LAB — PROTIME-INR: INR: 0.91 (ref 0.00–1.49)

## 2011-09-06 LAB — APTT: aPTT: 29 seconds (ref 24–37)

## 2011-09-07 LAB — CBC
HCT: 32.8 % — ABNORMAL LOW (ref 39.0–52.0)
Hemoglobin: 11.6 g/dL — ABNORMAL LOW (ref 13.0–17.0)
MCH: 32.3 pg (ref 26.0–34.0)
MCH: 32.3 pg (ref 26.0–34.0)
MCV: 92.8 fL (ref 78.0–100.0)
MCV: 93.7 fL (ref 78.0–100.0)
Platelets: 148 10*3/uL — ABNORMAL LOW (ref 150–400)
RBC: 3.59 MIL/uL — ABNORMAL LOW (ref 4.22–5.81)
RBC: 3.5 MIL/uL — ABNORMAL LOW (ref 4.22–5.81)
RDW: 13.4 % (ref 11.5–15.5)
WBC: 15.3 10*3/uL — ABNORMAL HIGH (ref 4.0–10.5)
WBC: 16.1 10*3/uL — ABNORMAL HIGH (ref 4.0–10.5)

## 2011-09-07 LAB — BASIC METABOLIC PANEL
CO2: 24 mEq/L (ref 19–32)
Calcium: 8.4 mg/dL (ref 8.4–10.5)
Chloride: 98 mEq/L (ref 96–112)
Creatinine, Ser: 0.62 mg/dL (ref 0.50–1.35)
Glucose, Bld: 123 mg/dL — ABNORMAL HIGH (ref 70–99)

## 2011-09-08 LAB — CBC
HCT: 31 % — ABNORMAL LOW (ref 39.0–52.0)
Hemoglobin: 10.6 g/dL — ABNORMAL LOW (ref 13.0–17.0)
MCH: 31.7 pg (ref 26.0–34.0)
MCHC: 34.2 g/dL (ref 30.0–36.0)
RDW: 13 % (ref 11.5–15.5)

## 2011-09-09 LAB — CBC
HCT: 26.8 % — ABNORMAL LOW (ref 39.0–52.0)
Hemoglobin: 9.3 g/dL — ABNORMAL LOW (ref 13.0–17.0)
MCH: 31.8 pg (ref 26.0–34.0)
MCHC: 34.7 g/dL (ref 30.0–36.0)
MCV: 91.8 fL (ref 78.0–100.0)

## 2011-09-09 LAB — DIFFERENTIAL
Lymphocytes Relative: 7 % — ABNORMAL LOW (ref 12–46)
Lymphs Abs: 1 10*3/uL (ref 0.7–4.0)
Monocytes Absolute: 1.8 10*3/uL — ABNORMAL HIGH (ref 0.1–1.0)
Monocytes Relative: 13 % — ABNORMAL HIGH (ref 3–12)
Neutro Abs: 11.7 10*3/uL — ABNORMAL HIGH (ref 1.7–7.7)

## 2011-09-10 LAB — COMPREHENSIVE METABOLIC PANEL
ALT: 17 U/L (ref 0–53)
AST: 44 U/L — ABNORMAL HIGH (ref 0–37)
CO2: 31 mEq/L (ref 19–32)
Calcium: 9.4 mg/dL (ref 8.4–10.5)
GFR calc non Af Amer: >60 mL/min (ref >60)
Sodium: 129 mEq/L — ABNORMAL LOW (ref 135–145)
Total Protein: 6.1 g/dL (ref 6.0–8.3)

## 2011-09-10 LAB — CBC
Hemoglobin: 9.3 g/dL — ABNORMAL LOW (ref 13.0–17.0)
MCH: 31.4 pg (ref 26.0–34.0)
MCHC: 34.1 g/dL (ref 30.0–36.0)
MCHC: 35.1 g/dL (ref 30.0–36.0)
MCV: 92 fL (ref 78.0–100.0)
Platelets: 147 10*3/uL — ABNORMAL LOW (ref 150–400)
RBC: 2.74 MIL/uL — ABNORMAL LOW (ref 4.22–5.81)
RDW: 12.8 % (ref 11.5–15.5)
WBC: 11.7 10*3/uL — ABNORMAL HIGH (ref 4.0–10.5)

## 2011-09-10 LAB — CARDIAC PANEL(CRET KIN+CKTOT+MB+TROPI)
Relative Index: 1.6 (ref 0.0–2.5)
Relative Index: 2.5 (ref 0.0–2.5)
Relative Index: 2.7 — ABNORMAL HIGH (ref 0.0–2.5)
Troponin I: 1.75 ng/mL (ref <0.30)
Troponin I: 2.47 ng/mL (ref <0.30)

## 2011-09-10 LAB — D-DIMER, QUANTITATIVE: D-Dimer, Quant: 3 ug/mL-FEU — ABNORMAL HIGH (ref 0.00–0.48)

## 2011-09-11 LAB — HEPARIN LEVEL (UNFRACTIONATED): Heparin Unfractionated: 0.17 IU/mL — ABNORMAL LOW (ref 0.30–0.70)

## 2011-09-11 LAB — CBC
HCT: 23.3 % — ABNORMAL LOW (ref 39.0–52.0)
MCHC: 35.6 g/dL (ref 30.0–36.0)
MCV: 91.4 fL (ref 78.0–100.0)
RDW: 12.8 % (ref 11.5–15.5)

## 2011-09-11 LAB — BASIC METABOLIC PANEL
BUN: 11 mg/dL (ref 6–23)
Creatinine, Ser: 0.71 mg/dL (ref 0.50–1.35)
GFR calc Af Amer: >60 mL/min (ref >60)
GFR calc non Af Amer: >60 mL/min (ref >60)

## 2011-09-11 LAB — IRON AND TIBC: Iron: <10 ug/dL — ABNORMAL LOW (ref 42–135)

## 2011-09-11 LAB — VITAMIN B12: Vitamin B-12: 654 pg/mL (ref 211–911)

## 2011-09-11 LAB — CARDIAC PANEL(CRET KIN+CKTOT+MB+TROPI)
CK, MB: 7 ng/mL (ref 0.3–4.0)
Total CK: 257 U/L — ABNORMAL HIGH (ref 7–232)

## 2011-09-12 LAB — HEPARIN LEVEL (UNFRACTIONATED)
Heparin Unfractionated: 0.18 IU/mL — ABNORMAL LOW (ref 0.30–0.70)
Heparin Unfractionated: 0.41 IU/mL (ref 0.30–0.70)

## 2011-09-12 LAB — CROSSMATCH
Antibody Screen: NEGATIVE
Unit division: 0

## 2011-09-12 LAB — CBC
HCT: 31.9 % — ABNORMAL LOW (ref 39.0–52.0)
Hemoglobin: 11.1 g/dL — ABNORMAL LOW (ref 13.0–17.0)
MCHC: 34.8 g/dL (ref 30.0–36.0)
RBC: 3.53 MIL/uL — ABNORMAL LOW (ref 4.22–5.81)

## 2011-09-13 LAB — BASIC METABOLIC PANEL
BUN: 10 mg/dL (ref 6–23)
Chloride: 94 mEq/L — ABNORMAL LOW (ref 96–112)
GFR calc Af Amer: >60 mL/min (ref >60)
GFR calc non Af Amer: >60 mL/min (ref >60)
Potassium: 4.3 mEq/L (ref 3.5–5.1)
Sodium: 131 mEq/L — ABNORMAL LOW (ref 135–145)

## 2011-09-13 LAB — CBC
HCT: 30.6 % — ABNORMAL LOW (ref 39.0–52.0)
Hemoglobin: 10.6 g/dL — ABNORMAL LOW (ref 13.0–17.0)
MCHC: 34.6 g/dL (ref 30.0–36.0)
RDW: 13.6 % (ref 11.5–15.5)
WBC: 10.3 10*3/uL (ref 4.0–10.5)

## 2011-09-14 LAB — CBC
HCT: 33.8 % — ABNORMAL LOW (ref 39.0–52.0)
Hemoglobin: 11.7 g/dL — ABNORMAL LOW (ref 13.0–17.0)
RBC: 3.68 MIL/uL — ABNORMAL LOW (ref 4.22–5.81)

## 2011-09-15 LAB — CBC
HCT: 32.2 % — ABNORMAL LOW (ref 39.0–52.0)
Hemoglobin: 11 g/dL — ABNORMAL LOW (ref 13.0–17.0)
MCH: 31.3 pg (ref 26.0–34.0)
MCV: 91.7 fL (ref 78.0–100.0)
RBC: 3.51 MIL/uL — ABNORMAL LOW (ref 4.22–5.81)
WBC: 12 10*3/uL — ABNORMAL HIGH (ref 4.0–10.5)
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
## 2011-09-17 ENCOUNTER — Other Ambulatory Visit: Payer: Self-pay | Admitting: *Deleted

## 2011-09-17 MED ORDER — ALBUTEROL SULFATE HFA 108 (90 BASE) MCG/ACT IN AERS: 2.0000 | INHALATION_SPRAY | Freq: Four times a day (QID) | RESPIRATORY_TRACT | Status: DC | PRN

## 2011-09-17 MED ORDER — FLUTICASONE-SALMETEROL 500-50 MCG/DOSE IN AEPB: 1.0000 | INHALATION_SPRAY | Freq: Two times a day (BID) | RESPIRATORY_TRACT | Status: DC

## 2011-09-17 NOTE — Telephone Encounter (Signed)
Daughter called requesting refill of 2 meds, will print, sign and fax to [phone]

## 2011-09-23 ENCOUNTER — Telehealth (INDEPENDENT_AMBULATORY_CARE_PROVIDER_SITE_OTHER): Payer: Self-pay | Admitting: Orthopedic Surgery

## 2011-09-23 ENCOUNTER — Telehealth: Payer: Self-pay | Admitting: Family Medicine

## 2011-09-23 NOTE — Telephone Encounter (Signed)
Pt should be getting from orthopedics (I assume he is still under their care). If not, needs office follow up.
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
## 2011-09-23 NOTE — Telephone Encounter (Signed)
Pt informed, he is not aware of an ortho physician, he gave all his D/C instructions to his lawyer, so is not sure what the instructions said.

## 2011-09-23 NOTE — Telephone Encounter (Signed)
Patient called for refill on pain medication. I redirected him to Dr. Laverta [place] office and provided the telephone number.
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
## 2011-09-28 ENCOUNTER — Other Ambulatory Visit: Payer: Self-pay | Admitting: Family Medicine
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
## 2011-10-25 ENCOUNTER — Ambulatory Visit (INDEPENDENT_AMBULATORY_CARE_PROVIDER_SITE_OTHER): Payer: PRIVATE HEALTH INSURANCE | Admitting: Family Medicine

## 2011-10-25 ENCOUNTER — Encounter: Payer: Self-pay | Admitting: Family Medicine

## 2011-10-25 DIAGNOSIS — J449 Chronic obstructive pulmonary disease, unspecified: Secondary | ICD-10-CM

## 2011-10-25 DIAGNOSIS — I10 Essential (primary) hypertension: Secondary | ICD-10-CM

## 2011-10-25 MED ORDER — LISINOPRIL 20 MG PO TABS: 20.0000 mg | ORAL_TABLET | Freq: Every day | ORAL | Status: DC
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
## 2011-12-08 ENCOUNTER — Ambulatory Visit (INDEPENDENT_AMBULATORY_CARE_PROVIDER_SITE_OTHER): Payer: No Typology Code available for payment source | Admitting: Family Medicine

## 2011-12-08 ENCOUNTER — Encounter: Payer: Self-pay | Admitting: Family Medicine

## 2011-12-08 DIAGNOSIS — I10 Essential (primary) hypertension: Secondary | ICD-10-CM

## 2011-12-08 DIAGNOSIS — M159 Polyosteoarthritis, unspecified: Secondary | ICD-10-CM

## 2011-12-08 DIAGNOSIS — J449 Chronic obstructive pulmonary disease, unspecified: Secondary | ICD-10-CM

## 2011-12-08 DIAGNOSIS — E785 Hyperlipidemia, unspecified: Secondary | ICD-10-CM

## 2011-12-08 MED ORDER — HYDROCODONE-ACETAMINOPHEN 7.5-500 MG PO TABS: 1.0000 | ORAL_TABLET | Freq: Four times a day (QID) | ORAL | Status: DC | PRN

## 2011-12-08 NOTE — Progress Notes (Signed)
  Subjective:    Patient ID: Douglas Mann, male    DOB: [birth year], 65 y.o.   MRN: [id number]  HPI  Patient continues recuperation from complicated lower extremity fracture. No significant pain at this time. Very limited ambulation. He has COPD on oxygen. COPD symptoms stable. Remains on Advair and uses Ventolin as needed. No recent respiratory infections.  Hypertension treated with lisinopril, prazosin, and atenolol. Blood pressure stable. No orthostasis. Hyperlipidemia treated with simvastatin. Needs repeat labs but not fasting today. No myalgias. Does have history of CAD and peripheral vascular disease. Unfortunately still smokes occasionally. He knows not to smoke with oxygen.  Past Medical History  Diagnosis Date  . HYPERLIPIDEMIA 10/01/2009  . HYPERTENSION 10/01/2009  . CAD 10/01/2009  . PVD 10/01/2009  . ALLERGIC RHINITIS 12/24/2009  . COPD 10/01/2009  . 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 COUGH, CHRONIC 01/28/2010   No past surgical history on file.  reports that he has been smoking Cigarettes.  He has a 1 pack-year smoking history. He does not have any smokeless tobacco history on file. He reports that he drinks alcohol. His drug history not on file. family history includes Heart disease in his other. No Known Allergies     Review of Systems  Constitutional: Negative for fever and chills.  HENT: Negative for sore throat and trouble swallowing.   Respiratory: Positive for shortness of breath. Negative for wheezing.   Cardiovascular: Negative for chest pain, palpitations and leg swelling.  Genitourinary: Negative for dysuria.  Neurological: Negative for dizziness and headaches.       Objective:   Physical Exam  Constitutional: He is oriented to person, place, and time. He appears well-developed and well-nourished.  HENT:       Minimal posterior pharynx erythema. No thrush or exudate.  Neck: Neck supple. No thyromegaly present.  Cardiovascular: Normal  rate and regular rhythm.   Pulmonary/Chest:       Patient has diminished breath sounds throughout. Faint diffuse wheezes. No rales  Musculoskeletal: He exhibits no edema.  Lymphadenopathy:    He has no cervical adenopathy.  Neurological: He is alert and oriented to person, place, and time.          Assessment & Plan:  #1 COPD. Flu vaccine up to date. Pneumovax up-to-date. Continue Advair and Ventolin as needed. Patient again encouraged to stop smoking. #2 hypertension stable.  continue current medications #3 hyperlipidemia. Schedule fasting labs in 2 months and continue simvastatin #4 osteoarthritis pains. Has used very limited the hydrocodone in past and one refill given today.

## 2011-12-30 ENCOUNTER — Encounter: Payer: Self-pay | Admitting: Family Medicine

## 2011-12-30 ENCOUNTER — Ambulatory Visit (INDEPENDENT_AMBULATORY_CARE_PROVIDER_SITE_OTHER): Payer: No Typology Code available for payment source | Admitting: Family Medicine

## 2011-12-30 DIAGNOSIS — K649 Unspecified hemorrhoids: Secondary | ICD-10-CM

## 2011-12-30 DIAGNOSIS — E785 Hyperlipidemia, unspecified: Secondary | ICD-10-CM

## 2011-12-30 DIAGNOSIS — I10 Essential (primary) hypertension: Secondary | ICD-10-CM

## 2011-12-30 DIAGNOSIS — I2510 Atherosclerotic heart disease of native coronary artery without angina pectoris: Secondary | ICD-10-CM

## 2011-12-30 DIAGNOSIS — J449 Chronic obstructive pulmonary disease, unspecified: Secondary | ICD-10-CM

## 2011-12-30 LAB — HEPATIC FUNCTION PANEL
ALT: 13 U/L (ref 0–53)
Bilirubin, Direct: 0 mg/dL (ref 0.0–0.3)
Total Bilirubin: 0.4 mg/dL (ref 0.3–1.2)

## 2011-12-30 LAB — BASIC METABOLIC PANEL
CO2: 33 mEq/L — ABNORMAL HIGH (ref 19–32)
Chloride: 96 mEq/L (ref 96–112)
GFR: 99.35 mL/min (ref >60.00)
Glucose, Bld: 107 mg/dL — ABNORMAL HIGH (ref 70–99)
Potassium: 5.4 mEq/L — ABNORMAL HIGH (ref 3.5–5.1)
Sodium: 134 mEq/L — ABNORMAL LOW (ref 135–145)

## 2011-12-30 LAB — LIPID PANEL
HDL: 46 mg/dL (ref >39.00)
LDL Cholesterol: 100 mg/dL — ABNORMAL HIGH (ref 0–99)
Total CHOL/HDL Ratio: 3
VLDL: 13.4 mg/dL (ref 0.0–40.0)

## 2011-12-30 MED ORDER — CLOPIDOGREL BISULFATE 75 MG PO TABS: 75.0000 mg | ORAL_TABLET | Freq: Every day | ORAL | Status: DC

## 2011-12-30 MED ORDER — ATENOLOL 50 MG PO TABS: 50.0000 mg | ORAL_TABLET | Freq: Two times a day (BID) | ORAL | Status: DC

## 2011-12-30 MED ORDER — PRAZOSIN HCL 1 MG PO CAPS: 1.0000 mg | ORAL_CAPSULE | Freq: Two times a day (BID) | ORAL | Status: DC

## 2011-12-30 MED ORDER — SIMVASTATIN 80 MG PO TABS: 80.0000 mg | ORAL_TABLET | Freq: Every day | ORAL | Status: DC

## 2011-12-30 MED ORDER — HYDROCORTISONE 2.5 % RE CREA: TOPICAL_CREAM | Freq: Two times a day (BID) | RECTAL | Status: DC

## 2011-12-30 MED ORDER — FLUTICASONE-SALMETEROL 500-50 MCG/DOSE IN AEPB: 1.0000 | INHALATION_SPRAY | Freq: Two times a day (BID) | RESPIRATORY_TRACT | Status: DC

## 2011-12-30 MED ORDER — LISINOPRIL 20 MG PO TABS: 20.0000 mg | ORAL_TABLET | Freq: Every day | ORAL | Status: DC

## 2011-12-30 NOTE — Progress Notes (Signed)
  Subjective:    Patient ID: Douglas Mann, male    DOB: 12/02/1946, 66 y.o.   MRN: [id number]  HPI  Medical followup. Patient has COPD. Oxygen dependent.  Can only stay off for very brief periods without oxygen. Using Advair regularly. Symptoms are stable. No recent fever or chills.  Recent hemorrhoids. Prescribed Anusol HC cream which is helping. No recent active bleeding. He held his aspirin one week ago. No pain with stools. Occasional bright red blood.  Intermittent numbness right great toe. No back pain. Recent complicated fracture right lower extremity with surgery. No color changes of foot and toes been warm to touch.  Patient requesting home health aide. Very immobile since his surgery.  With combination of orthopedic issues and COPD has been very difficult to care for himself.   Review of Systems  Constitutional: Positive for fatigue. Negative for fever and chills.  Respiratory: Positive for shortness of breath. Negative for cough and wheezing.   Cardiovascular: Negative for chest pain, palpitations and leg swelling.  Gastrointestinal: Negative for abdominal pain.  Genitourinary: Negative for dysuria.  Neurological: Positive for weakness. Negative for dizziness, syncope and headaches.  Hematological: Negative for adenopathy. Does not bruise/bleed easily.  Psychiatric/Behavioral: Negative for agitation.       Objective:   Physical Exam  HENT:  Right Ear: External ear normal.  Left Ear: External ear normal.  Mouth/Throat: Oropharynx is clear and moist.  Neck: Neck supple. No thyromegaly present.  Cardiovascular: Normal rate and regular rhythm.   Pulmonary/Chest:       Diminished breath sounds throughout. Faint diffuse wheezes. No rales. No retractions.  Musculoskeletal:       Mild edema right lower extremity from recent surgery. Feet are warm but with good capillary refill          Assessment & Plan:  #1 oxygen-dependent COPD. Refill Advair. #2 intermittent  paresthesias right great toe possibly related to recent surgery. Symptoms are mild and recommended observation  #3 history of hemorrhoids. Refill Anusol-HC cream  #4 hyperlipidemia. Recheck lipids and hepatic panel. Refill simvastatin for one year  #5 hypertension stable refill current medications for one year

## 2012-01-03 ENCOUNTER — Telehealth: Payer: Self-pay | Admitting: *Deleted

## 2012-01-03 NOTE — Telephone Encounter (Signed)
6 months if all stable.

## 2012-01-03 NOTE — Progress Notes (Signed)
Quick Note:  Pt aware of results. ______ 

## 2012-01-03 NOTE — Telephone Encounter (Signed)
Pt is wanting to know when you want to see him again?

## 2012-01-04 NOTE — Telephone Encounter (Signed)
Pt aware of this. 

## 2012-01-19 ENCOUNTER — Ambulatory Visit: Payer: No Typology Code available for payment source | Admitting: Family Medicine

## 2012-03-01 ENCOUNTER — Telehealth: Payer: Self-pay | Admitting: Family Medicine

## 2012-03-01 NOTE — Telephone Encounter (Signed)
[facility] called req order to get mobile oxygen delivered to pts home.   pls fax to fax# [phone]

## 2012-03-01 NOTE — Telephone Encounter (Signed)
OK to order 

## 2012-03-01 NOTE — Telephone Encounter (Signed)
Please advise 

## 2012-03-02 NOTE — Telephone Encounter (Signed)
Rx faxed for mobile O2, confirmation received

## 2012-03-27 ENCOUNTER — Other Ambulatory Visit: Payer: Self-pay | Admitting: Family Medicine

## 2012-05-05 ENCOUNTER — Telehealth: Payer: Self-pay | Admitting: Family Medicine

## 2012-05-05 NOTE — Telephone Encounter (Addendum)
Pt called and is req to get an order for [employer], pt also needs a form to be able to [place] [employer] [place] with a wheelchair.

## 2012-05-05 NOTE — Telephone Encounter (Signed)
Pt will need OV, please schedule with pt to discuss.  Thank you

## 2012-05-05 NOTE — Telephone Encounter (Signed)
Called pt and schd for ov with Dr Caryl Never on 05/09/12 as noted.

## 2012-05-09 ENCOUNTER — Encounter: Payer: Self-pay | Admitting: Family Medicine

## 2012-05-09 ENCOUNTER — Ambulatory Visit (INDEPENDENT_AMBULATORY_CARE_PROVIDER_SITE_OTHER): Payer: PRIVATE HEALTH INSURANCE | Admitting: Family Medicine

## 2012-05-09 VITALS — BP 130/74 | Temp 97.8°F | Wt 140.0 lb

## 2012-05-09 DIAGNOSIS — J449 Chronic obstructive pulmonary disease, unspecified: Secondary | ICD-10-CM

## 2012-05-09 DIAGNOSIS — M159 Polyosteoarthritis, unspecified: Secondary | ICD-10-CM

## 2012-05-09 DIAGNOSIS — I2510 Atherosclerotic heart disease of native coronary artery without angina pectoris: Secondary | ICD-10-CM

## 2012-05-09 DIAGNOSIS — I739 Peripheral vascular disease, unspecified: Secondary | ICD-10-CM

## 2012-05-09 NOTE — Progress Notes (Signed)
Subjective:    Patient ID: Douglas Mann, male    DOB: [birth year], 66 y.o.   MRN: [id number]  HPI  Patient seen for evaluation for power mobility device. He has history of CAD, peripheral vascular disease, hypertension, hyperlipidemia, and severe COPD. He is oxygen dependent. Patient is requesting a power wheelchair. He currently ambulates with a cane and walker but is very limited mostly because of his COPD also some chronic left shoulder pain. He had a serious fall several months ago with severe injury right leg (requiring surgery) and left shoulder injury as well. He is oxygen dependent and very limited in activities of daily living. He has COPD for several years now. He had progressive disease. He can only ambulate about 5 feet without having severe dyspnea. He is also limited ability to use walker because of his progressive left shoulder pain. He had previous physical therapy. The limited mobility is limiting ability to prepare food for himself and transfer within his current place of residence. Patient is also having increasing balance problems which interferes with his ability to ambulate.  Patient does not feel he can operate a regular manual wheelchair because of limited range of motion and strength left upper extremity. He has greatly reduced range of motion left shoulder with abduction. He does not have any cognitive impairment or motor skill impairment which would interfere with the operation of mobility device.  Patient would have difficulty transferring in and out of scooter because of his balance issues and general debility and lack of strength left shoulder.  Patient is O2 dependent and requesting portable battery-operated oxygen. [facility]  Past Medical History  Diagnosis Date  . HYPERLIPIDEMIA 10/01/2009  . HYPERTENSION 10/01/2009  . CAD 10/01/2009  . PVD 10/01/2009  . ALLERGIC RHINITIS 12/24/2009  . COPD 10/01/2009  . OSTEOARTHRITIS,  GENERALIZED, MULTIPLE JOINTS 12/24/2009  . COUGH, CHRONIC 01/28/2010   No past surgical history on file.  reports that he has been smoking Cigarettes.  He has a 1 pack-year smoking history. He does not have any smokeless tobacco history on file. He reports that he drinks alcohol. His drug history not on file. family history includes Heart disease in his other. No Known Allergies     Review of Systems  Constitutional: Negative for fever, chills and appetite change.  Respiratory: Positive for shortness of breath and wheezing. Negative for cough.   Cardiovascular: Negative for chest pain, palpitations and leg swelling.  Gastrointestinal: Negative for abdominal pain.  Genitourinary: Positive for frequency. Negative for hematuria.  Neurological: Positive for dizziness and weakness. Negative for syncope.  Hematological: Negative for adenopathy. Does not bruise/bleed easily.  Psychiatric/Behavioral: Negative for confusion.       Objective:   Physical Exam  Constitutional: He is oriented to person, place, and time. He appears well-developed and well-nourished.  HENT:  Mouth/Throat: Oropharynx is clear and moist.  Eyes: Pupils are equal, round, and reactive to light.  Neck: Neck supple. No thyromegaly present.  Cardiovascular: Normal rate and regular rhythm.   Pulmonary/Chest: He has wheezes.       Diminished breath sounds throughout  Abdominal: Soft. There is no tenderness.  Musculoskeletal: He exhibits no edema.       Patient has limited range of motion left shoulder with abduction of 80-90. He is limited mostly by pain but appears to have some weakness of abduction as well.  Lymphadenopathy:    He has no cervical adenopathy.  Neurological: He is alert and oriented to person,  place, and time. No cranial nerve deficit.  Skin: No rash noted.  Psychiatric: He has a normal mood and affect. His behavior is normal.          Assessment & Plan:  #1 severe endstage COPD. We'll initiate  prescription for power mobility device #2 chronic left shoulder pain. Cannot rule out rotator cuff tear. Patient would not be a candidate for surgery-secondary to severe chronic lung disease.

## 2012-06-29 ENCOUNTER — Telehealth: Payer: Self-pay | Admitting: Family Medicine

## 2012-06-29 MED ORDER — FLUTICASONE-SALMETEROL 500-50 MCG/DOSE IN AEPB: 1.0000 | INHALATION_SPRAY | Freq: Two times a day (BID) | RESPIRATORY_TRACT | Status: DC

## 2012-06-29 NOTE — Telephone Encounter (Signed)
Please call Douglas Mann or daughter Douglas Mann back. They need a RX for pick up on his Advair so he can send it to [name] [employer]. Per pt, [facility] will then pay for it and fill the rx.

## 2012-06-29 NOTE — Telephone Encounter (Signed)
Rx printed and mailed to pt home address.  I tried to inform pt, no answer on home phone, no message machine.

## 2012-12-07 ENCOUNTER — Encounter: Payer: Self-pay | Admitting: Family Medicine

## 2012-12-07 ENCOUNTER — Ambulatory Visit (INDEPENDENT_AMBULATORY_CARE_PROVIDER_SITE_OTHER): Payer: PRIVATE HEALTH INSURANCE | Admitting: Family Medicine

## 2012-12-07 VITALS — BP 140/70 | Temp 97.9°F | Wt 147.0 lb

## 2012-12-07 DIAGNOSIS — J449 Chronic obstructive pulmonary disease, unspecified: Secondary | ICD-10-CM

## 2012-12-07 DIAGNOSIS — E785 Hyperlipidemia, unspecified: Secondary | ICD-10-CM

## 2012-12-07 DIAGNOSIS — I10 Essential (primary) hypertension: Secondary | ICD-10-CM

## 2012-12-07 DIAGNOSIS — I2510 Atherosclerotic heart disease of native coronary artery without angina pectoris: Secondary | ICD-10-CM

## 2012-12-07 DIAGNOSIS — J4489 Other specified chronic obstructive pulmonary disease: Secondary | ICD-10-CM

## 2012-12-07 MED ORDER — DOXYCYCLINE HYCLATE 100 MG PO TABS: 100.0000 mg | ORAL_TABLET | Freq: Two times a day (BID) | ORAL | Status: DC

## 2012-12-07 MED ORDER — HYDROCODONE-ACETAMINOPHEN 7.5-500 MG PO TABS: 1.0000 | ORAL_TABLET | Freq: Four times a day (QID) | ORAL | Status: DC | PRN

## 2012-12-07 NOTE — Progress Notes (Signed)
  Subjective:    Patient ID: Douglas Mann, male    DOB: May 10, 1946, 66 y.o.   MRN: [id number]  HPI  Routine medical followup. Patient has history of hypertension, hyperlipidemia, CAD, peripheral vascular disease, COPD, and osteoarthritis. Unfortunately, he is still smoking -usually about 5 cigarettes per day. Uses oxygen 2 L nasal cannula and he knows not to smoke while on oxygen therapy. He's had progressive loss of lung function over the past several years. Very inactive secondary to his chronic lung disease. Medications reviewed. Remains on Advair. Previously tried Spiriva but did not see much improvement and stopped. Had flu vaccine in October. Previous Pneumovax.  No orthostasis. No chest pains. No claudication symptoms. No recent bleeding complications. Requesting refill of Lortab. Uses very rarely for severe osteoarthritis pains. Only took about 60 all last year.  Past Medical History  Diagnosis Date  . HYPERLIPIDEMIA 10/01/2009  . HYPERTENSION 10/01/2009  . CAD 10/01/2009  . PVD 10/01/2009  . ALLERGIC RHINITIS 12/24/2009  . COPD 10/01/2009  . 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 COUGH, CHRONIC 01/28/2010   Past Surgical History  Procedure Date  . Fracture surgery 2012    ORIF r tibia fracture    reports that he has been smoking Cigarettes.  He has a 1 pack-year smoking history. He does not have any smokeless tobacco history on file. He reports that he drinks alcohol. His drug history not on file. family history includes Heart disease in his other. No Known Allergies    Review of Systems  Constitutional: Negative for fever, chills, appetite change and unexpected weight change.  Respiratory: Positive for cough and shortness of breath. Negative for wheezing.   Cardiovascular: Negative for chest pain, palpitations and leg swelling.  Gastrointestinal: Negative for abdominal pain.  Genitourinary: Negative for dysuria.  Neurological: Negative for dizziness and  syncope.       Objective:   Physical Exam  Constitutional: He is oriented to person, place, and time. He appears well-developed.  HENT:  Right Ear: External ear normal.  Left Ear: External ear normal.  Mouth/Throat: Oropharynx is clear and moist.  Neck: Neck supple. No thyromegaly present.  Cardiovascular: Normal rate and regular rhythm.   Pulmonary/Chest:       Diminished breath sounds throughout. Faint expiratory wheezes. No rales  Musculoskeletal: He exhibits no edema.  Neurological: He is alert and oriented to person, place, and time.  Psychiatric: He has a normal mood and affect. His behavior is normal.          Assessment & Plan:  #1 advanced COPD. Patient has low motivation to quit smoking. Continue Advair. Continue home oxygen  #2 hypertension. Stable. Continue current medications  #3 hyperlipidemia. Check lipid and hepatic panel

## 2012-12-08 LAB — BASIC METABOLIC PANEL
CO2: 31 mEq/L (ref 19–32)
Chloride: 94 mEq/L — ABNORMAL LOW (ref 96–112)
Creatinine, Ser: 1 mg/dL (ref 0.4–1.5)
Glucose, Bld: 94 mg/dL (ref 70–99)

## 2012-12-08 LAB — HEPATIC FUNCTION PANEL
Albumin: 4.3 g/dL (ref 3.5–5.2)
Alkaline Phosphatase: 68 U/L (ref 39–117)
Total Bilirubin: 0.3 mg/dL (ref 0.3–1.2)

## 2012-12-08 LAB — LIPID PANEL
LDL Cholesterol: 76 mg/dL (ref 0–99)
Total CHOL/HDL Ratio: 4
Triglycerides: 147 mg/dL (ref 0.0–149.0)

## 2012-12-11 NOTE — Progress Notes (Signed)
Quick Note:  Pt informed ______ 

## 2012-12-26 ENCOUNTER — Other Ambulatory Visit: Payer: Self-pay | Admitting: Family Medicine

## 2012-12-27 ENCOUNTER — Other Ambulatory Visit: Payer: Self-pay | Admitting: *Deleted

## 2012-12-27 MED ORDER — FLUTICASONE-SALMETEROL 500-50 MCG/DOSE IN AEPB: INHALATION_SPRAY | RESPIRATORY_TRACT | Status: DC

## 2013-04-03 ENCOUNTER — Other Ambulatory Visit: Payer: Self-pay | Admitting: *Deleted

## 2013-04-03 MED ORDER — FLUTICASONE-SALMETEROL 500-50 MCG/DOSE IN AEPB: 1.0000 | INHALATION_SPRAY | Freq: Two times a day (BID) | RESPIRATORY_TRACT | Status: DC

## 2013-08-01 ENCOUNTER — Telehealth: Payer: Self-pay | Admitting: Family Medicine

## 2013-08-01 NOTE — Telephone Encounter (Signed)
Pt dropped off paper work for [facility] to get Fluticasone-Salmeterol (ADVAIR DISKUS) 500-50 MCG/DOSE (4) pt states [facility] called him and ssid his rx is ready to pick up (the advair) Pt would like to confirm his paperwork was not sent to [employer]

## 2013-08-13 NOTE — Telephone Encounter (Signed)
PT daughter is calling to confirm that we did complete the pt's [facility] paperwork. She states that she is the one that dropped it off, and it contained income requirements, and everything else. Please call daughter back.

## 2013-08-13 NOTE — Telephone Encounter (Signed)
Informed patient that paperwork was sent 08/10/13

## 2013-08-21 NOTE — Telephone Encounter (Signed)
Print off copy of Advair Rx and I will sign and we can have them pick up.

## 2013-08-21 NOTE — Telephone Encounter (Signed)
Pt has not heard from this company concerning advair rx.  Pt thinks all they need to do is get md to write the RX so the can pick up/ Pls advise.

## 2013-08-22 MED ORDER — FLUTICASONE-SALMETEROL 500-50 MCG/DOSE IN AEPB: INHALATION_SPRAY | RESPIRATORY_TRACT | Status: DC

## 2013-08-22 NOTE — Addendum Note (Signed)
Addended by: Thomasena Edis on: 08/22/2013 08:27 AM   Modules accepted: Orders

## 2013-08-29 ENCOUNTER — Telehealth: Payer: Self-pay | Admitting: Family Medicine

## 2013-09-14 ENCOUNTER — Encounter: Payer: Self-pay | Admitting: Family Medicine

## 2013-09-14 ENCOUNTER — Ambulatory Visit (INDEPENDENT_AMBULATORY_CARE_PROVIDER_SITE_OTHER): Payer: PRIVATE HEALTH INSURANCE | Admitting: Family Medicine

## 2013-09-14 VITALS — BP 120/76 | HR 87 | Temp 97.9°F | Wt 153.0 lb

## 2013-09-14 DIAGNOSIS — I10 Essential (primary) hypertension: Secondary | ICD-10-CM

## 2013-09-14 DIAGNOSIS — R5381 Other malaise: Secondary | ICD-10-CM

## 2013-09-14 DIAGNOSIS — J449 Chronic obstructive pulmonary disease, unspecified: Secondary | ICD-10-CM

## 2013-09-14 DIAGNOSIS — M159 Polyosteoarthritis, unspecified: Secondary | ICD-10-CM

## 2013-09-14 DIAGNOSIS — Z23 Encounter for immunization: Secondary | ICD-10-CM

## 2013-09-14 DIAGNOSIS — J4489 Other specified chronic obstructive pulmonary disease: Secondary | ICD-10-CM

## 2013-09-14 DIAGNOSIS — E785 Hyperlipidemia, unspecified: Secondary | ICD-10-CM

## 2013-09-14 LAB — LIPID PANEL
Cholesterol: 165 mg/dL (ref 0–200)
HDL: 34.4 mg/dL — ABNORMAL LOW (ref >39.00)
Total CHOL/HDL Ratio: 5
Triglycerides: 258 mg/dL — ABNORMAL HIGH (ref 0.0–149.0)
VLDL: 51.6 mg/dL — ABNORMAL HIGH (ref 0.0–40.0)

## 2013-09-14 LAB — BASIC METABOLIC PANEL
BUN: 15 mg/dL (ref 6–23)
Calcium: 9.9 mg/dL (ref 8.4–10.5)
Chloride: 98 mEq/L (ref 96–112)
GFR: 75.13 mL/min (ref >60.00)
Glucose, Bld: 90 mg/dL (ref 70–99)
Potassium: 5.2 mEq/L — ABNORMAL HIGH (ref 3.5–5.1)
Sodium: 137 mEq/L (ref 135–145)

## 2013-09-14 LAB — HEPATIC FUNCTION PANEL
AST: 23 U/L (ref 0–37)
Albumin: 4.2 g/dL (ref 3.5–5.2)
Alkaline Phosphatase: 68 U/L (ref 39–117)
Total Protein: 7.7 g/dL (ref 6.0–8.3)

## 2013-09-14 LAB — TSH: TSH: 1.7 u[IU]/mL (ref 0.35–5.50)

## 2013-09-14 LAB — LDL CHOLESTEROL, DIRECT: Direct LDL: 96.1 mg/dL

## 2013-09-14 MED ORDER — HYDROCODONE-ACETAMINOPHEN 7.5-500 MG PO TABS: 1.0000 | ORAL_TABLET | Freq: Four times a day (QID) | ORAL | Status: DC | PRN

## 2013-09-14 NOTE — Progress Notes (Signed)
  Subjective:    Patient ID: Douglas Mann, male    DOB: June 02, 1946, 67 y.o.   MRN: [id number]  HPI Followup history of CAD, peripheral vascular disease , hypertension, hyperlipidemia, COPD, generalized osteoarthritis. Medications reviewed and compliant with all. He is on oxygen 24 /7. COPD relatively stable. He has significant dyspnea with any activity. Still needs flu vaccine. He had previous Pneumovax. No recent chest pain.  Very sedentary and limited in activity secondary to his COPD.    He complains of some intermittent chills but had no documented fever. Regarding his COPD is on Advair. Has been intolerant of Spiriva in the past. No recent falls.  Complains of excessive fatigue off and on during the past year.  Complains of intermittent hemorrhoid issues. Has used topical steroid cream in the past with some improvement. Is requesting home health aide to assist with ADLs.  Past Medical History  Diagnosis Date  . HYPERLIPIDEMIA 10/01/2009  . HYPERTENSION 10/01/2009  . CAD 10/01/2009  . PVD 10/01/2009  . ALLERGIC RHINITIS 12/24/2009  . COPD 10/01/2009  . 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 COUGH, CHRONIC 01/28/2010   Past Surgical History  Procedure Laterality Date  . Fracture surgery  2012    ORIF r tibia fracture    reports that he has been smoking Cigarettes.  He has a 1 pack-year smoking history. He does not have any smokeless tobacco history on file. He reports that  drinks alcohol. His drug history is not on file. family history includes Heart disease in his other. No Known Allergies    Review of Systems  Constitutional: Positive for chills and fatigue.  Eyes: Negative for visual disturbance.  Respiratory: Negative for cough, chest tightness and shortness of breath.   Cardiovascular: Negative for chest pain, palpitations and leg swelling.  Genitourinary: Negative for dysuria.  Musculoskeletal: Positive for back pain.  Neurological: Negative for  dizziness, syncope, weakness, light-headedness and headaches.  Psychiatric/Behavioral: Negative for confusion.       Objective:   Physical Exam  Constitutional: He appears well-developed and well-nourished.  HENT:  Right Ear: External ear normal.  Left Ear: External ear normal.  Mouth/Throat: Oropharynx is clear and moist.  Neck: Neck supple.  Cardiovascular: Normal rate.   Pulmonary/Chest:  Reduced breath sounds throughout. Some diffuse wheezes. No rales. No retractions  Musculoskeletal: He exhibits no edema.  Neurological: He is alert.          Assessment and plan  #1 COPD- patient has advanced COPD, oxygen dependent. Continue Advair. Previous intolerance to Spiriva. Flu vaccine given. #2 hypertension. Well controlled #3 hyperlipidemia. Check lipid and hepatic panel #4 intermittent chills and progressive fatigue. Check labs including TSH

## 2013-09-17 NOTE — Addendum Note (Signed)
Addended by: Kristian Covey on: 09/17/2013 08:13 AM   Modules accepted: Orders

## 2013-12-24 ENCOUNTER — Other Ambulatory Visit: Payer: Self-pay | Admitting: Family Medicine

## 2014-03-25 ENCOUNTER — Other Ambulatory Visit: Payer: Self-pay | Admitting: Family Medicine

## 2014-06-20 ENCOUNTER — Other Ambulatory Visit: Payer: Self-pay | Admitting: Family Medicine

## 2014-08-07 ENCOUNTER — Telehealth: Payer: Self-pay

## 2014-08-07 ENCOUNTER — Encounter: Payer: Self-pay | Admitting: Family Medicine

## 2014-08-07 ENCOUNTER — Telehealth: Payer: Self-pay | Admitting: Family Medicine

## 2014-08-07 ENCOUNTER — Ambulatory Visit (INDEPENDENT_AMBULATORY_CARE_PROVIDER_SITE_OTHER): Payer: Medicare Other | Admitting: Family Medicine

## 2014-08-07 VITALS — BP 104/74 | HR 86 | Temp 97.9°F | Wt 161.0 lb

## 2014-08-07 DIAGNOSIS — E785 Hyperlipidemia, unspecified: Secondary | ICD-10-CM

## 2014-08-07 DIAGNOSIS — M159 Polyosteoarthritis, unspecified: Secondary | ICD-10-CM

## 2014-08-07 DIAGNOSIS — R5383 Other fatigue: Secondary | ICD-10-CM

## 2014-08-07 DIAGNOSIS — R141 Gas pain: Secondary | ICD-10-CM

## 2014-08-07 DIAGNOSIS — R5381 Other malaise: Secondary | ICD-10-CM

## 2014-08-07 DIAGNOSIS — Z23 Encounter for immunization: Secondary | ICD-10-CM

## 2014-08-07 DIAGNOSIS — R140 Abdominal distension (gaseous): Secondary | ICD-10-CM

## 2014-08-07 DIAGNOSIS — I10 Essential (primary) hypertension: Secondary | ICD-10-CM

## 2014-08-07 DIAGNOSIS — R143 Flatulence: Secondary | ICD-10-CM

## 2014-08-07 DIAGNOSIS — J4489 Other specified chronic obstructive pulmonary disease: Secondary | ICD-10-CM

## 2014-08-07 DIAGNOSIS — J449 Chronic obstructive pulmonary disease, unspecified: Secondary | ICD-10-CM

## 2014-08-07 DIAGNOSIS — R142 Eructation: Secondary | ICD-10-CM

## 2014-08-07 LAB — HEPATIC FUNCTION PANEL
ALBUMIN: 4.2 g/dL (ref 3.5–5.2)
ALT: 13 U/L (ref 0–53)
AST: 19 U/L (ref 0–37)
Alkaline Phosphatase: 76 U/L (ref 39–117)
BILIRUBIN TOTAL: 0.8 mg/dL (ref 0.2–1.2)
Bilirubin, Direct: 0 mg/dL (ref 0.0–0.3)
TOTAL PROTEIN: 8.1 g/dL (ref 6.0–8.3)

## 2014-08-07 LAB — BASIC METABOLIC PANEL
BUN: 12 mg/dL (ref 6–23)
CHLORIDE: 98 meq/L (ref 96–112)
CO2: 31 mEq/L (ref 19–32)
Calcium: 9.7 mg/dL (ref 8.4–10.5)
Creatinine, Ser: 1.1 mg/dL (ref 0.4–1.5)
GFR: 67.41 mL/min (ref >60.00)
Glucose, Bld: 101 mg/dL — ABNORMAL HIGH (ref 70–99)
POTASSIUM: 5.4 meq/L — AB (ref 3.5–5.1)
SODIUM: 136 meq/L (ref 135–145)

## 2014-08-07 LAB — CBC WITH DIFFERENTIAL/PLATELET
BASOS ABS: 0 10*3/uL (ref 0.0–0.1)
Basophils Relative: 0.3 % (ref 0.0–3.0)
EOS PCT: 3.2 % (ref 0.0–5.0)
Eosinophils Absolute: 0.3 10*3/uL (ref 0.0–0.7)
HEMATOCRIT: 37 % — AB (ref 39.0–52.0)
Hemoglobin: 12.1 g/dL — ABNORMAL LOW (ref 13.0–17.0)
LYMPHS ABS: 2.1 10*3/uL (ref 0.7–4.0)
LYMPHS PCT: 20.5 % (ref 12.0–46.0)
MCHC: 32.7 g/dL (ref 30.0–36.0)
MCV: 91.3 fl (ref 78.0–100.0)
MONOS PCT: 10 % (ref 3.0–12.0)
Monocytes Absolute: 1 10*3/uL (ref 0.1–1.0)
NEUTROS PCT: 66 % (ref 43.0–77.0)
Neutro Abs: 6.9 10*3/uL (ref 1.4–7.7)
Platelets: 213 10*3/uL (ref 150.0–400.0)
RBC: 4.05 Mil/uL — ABNORMAL LOW (ref 4.22–5.81)
RDW: 14.4 % (ref 11.5–15.5)
WBC: 10.4 10*3/uL (ref 4.0–10.5)

## 2014-08-07 LAB — LIPID PANEL
CHOLESTEROL: 154 mg/dL (ref 0–200)
HDL: 34.7 mg/dL — AB (ref >39.00)
LDL CALC: 83 mg/dL (ref 0–99)
NonHDL: 119.3
TRIGLYCERIDES: 182 mg/dL — AB (ref 0.0–149.0)
Total CHOL/HDL Ratio: 4
VLDL: 36.4 mg/dL (ref 0.0–40.0)

## 2014-08-07 LAB — TSH: TSH: 2.32 u[IU]/mL (ref 0.35–4.50)

## 2014-08-07 MED ORDER — HYDROCODONE-ACETAMINOPHEN 7.5-500 MG PO TABS: 1.0000 | ORAL_TABLET | Freq: Four times a day (QID) | ORAL | Status: DC | PRN

## 2014-08-07 MED ORDER — TRIAMCINOLONE ACETONIDE 0.1 % EX CREA: TOPICAL_CREAM | Freq: Two times a day (BID) | CUTANEOUS | Status: DC

## 2014-08-07 MED ORDER — HYDROCODONE-ACETAMINOPHEN 7.5-325 MG PO TABS: 1.0000 | ORAL_TABLET | Freq: Four times a day (QID) | ORAL | Status: DC | PRN

## 2014-08-07 NOTE — Telephone Encounter (Signed)
Pharmacist stated they no longer make 7.5/500. They have 5/325, 7.5/325, 7.5/300. Which one would you like to change to for the patient. And the directions. #120

## 2014-08-07 NOTE — Telephone Encounter (Signed)
Change to Lortab 7.5/325 mg one po q 6 hours prn #60 with no refill.

## 2014-08-07 NOTE — Progress Notes (Signed)
   Subjective:    Patient ID: Douglas Mann, male    DOB: Aug 11, 1946, 68 y.o.   MRN: [id number]  Sinus Problem Associated symptoms include shortness of breath. Pertinent negatives include no chills or coughing.   Patient seen for medical followup and for new issue of abdominal distention over the past month or so. He has no history of liver issues or ascites. He states his appetite is slightly declined but his weight is up. He denies any peripheral edema issues. He's noticed some diffuse abdominal distention but without any associated abdominal pain. No clear provoking factors. He states he generally only eats about one meal per day. He has severe advanced COPD with chronic dyspnea which is unchanged. No orthopnea.  Chronic left hip pain with history of known severe degenerative arthritis. He takes very infrequent hydrocodone and requesting refill. No constipation issues.  Hypertension treated with lisinopril, atenolol, and Minipress. Blood pressure stable. No dizziness.  Hyperlipidemia on simvastatin. Denies any myalgias.  Past Medical History  Diagnosis Date  . HYPERLIPIDEMIA 10/01/2009  . HYPERTENSION 10/01/2009  . CAD 10/01/2009  . PVD 10/01/2009  . ALLERGIC RHINITIS 12/24/2009  . COPD 10/01/2009  . 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 COUGH, CHRONIC 01/28/2010   Past Surgical History  Procedure Laterality Date  . Fracture surgery  2012    ORIF r tibia fracture    reports that he has been smoking Cigarettes.  He has a 1 pack-year smoking history. He does not have any smokeless tobacco history on file. He reports that he drinks alcohol. His drug history is not on file. family history includes Heart disease in his other. No Known Allergies    Review of Systems  Constitutional: Positive for appetite change, fatigue and unexpected weight change. Negative for fever and chills.  Respiratory: Positive for shortness of breath. Negative for cough.   Cardiovascular:  Negative for chest pain, palpitations and leg swelling.  Gastrointestinal: Positive for abdominal distention. Negative for nausea, vomiting, abdominal pain, diarrhea, constipation and blood in stool.  Genitourinary: Negative for dysuria.  Neurological: Negative for dizziness and syncope.  Hematological: Negative for adenopathy. Does not bruise/bleed easily.       Objective:   Physical Exam  Constitutional: He is oriented to person, place, and time. He appears well-developed and well-nourished.  HENT:  Mouth/Throat: Oropharynx is clear and moist.  Neck: Neck supple. No thyromegaly present.  Cardiovascular: Normal rate and regular rhythm.   Pulmonary/Chest:  Patient has diminished breath sounds throughout. Few faint wheezes. No rales. No retractions.  Abdominal: Soft. Bowel sounds are normal. He exhibits distension. He exhibits no mass. There is no tenderness. There is no rebound and no guarding.  Diffuse abdominal distention. No increased tympany with percussion  Musculoskeletal: He exhibits no edema.  Neurological: He is alert and oriented to person, place, and time.          Assessment & Plan:  #1 abdominal distention. Clinically, doubt ascites.  No associated pain. He's noticed fairly dramatic increase in symptoms over the past month. He does not have any focal mass. No evidence for bowel obstruction. Check lab work and ultrasound #2 COPD. Symptomatically stable. Flu vaccine given #3 chronic hip pain. Refill Lortab for as needed use. He uses very infrequently #4 hypertension which is stable. Continue current medications #5 hyperlipidemia. Check lipid and hepatic panel. Continue simvastatin. Has previously taken 80 milligrams and we've recommended 40 mg dosage-especially with concomitant lisinopril use

## 2014-08-07 NOTE — Addendum Note (Signed)
Addended by: Marcina Millard on: 08/07/2014 10:23 AM   Modules accepted: Orders

## 2014-08-07 NOTE — Progress Notes (Signed)
Pre visit review using our clinic review tool, if applicable. No additional management support is needed unless otherwise documented below in the visit note. 

## 2014-08-07 NOTE — Telephone Encounter (Signed)
Relevant patient education mailed to patient.  

## 2014-08-07 NOTE — Telephone Encounter (Signed)
Pt is aware to come back to the office to exchange RX.

## 2014-08-13 ENCOUNTER — Emergency Department (HOSPITAL_COMMUNITY): Payer: Medicare Other

## 2014-08-13 ENCOUNTER — Encounter (HOSPITAL_COMMUNITY): Payer: Self-pay | Admitting: Emergency Medicine

## 2014-08-13 ENCOUNTER — Inpatient Hospital Stay (HOSPITAL_COMMUNITY)
Admission: EM | Admit: 2014-08-13 | Discharge: 2014-09-03 | DRG: 237 | Disposition: A | Discharge status: Home Health | Payer: Medicare Other | Attending: Internal Medicine | Admitting: Internal Medicine

## 2014-08-13 DIAGNOSIS — R079 Chest pain, unspecified: Secondary | ICD-10-CM

## 2014-08-13 DIAGNOSIS — E8881 Metabolic syndrome: Secondary | ICD-10-CM | POA: Diagnosis present

## 2014-08-13 DIAGNOSIS — Z79899 Other long term (current) drug therapy: Secondary | ICD-10-CM

## 2014-08-13 DIAGNOSIS — J441 Chronic obstructive pulmonary disease with (acute) exacerbation: Secondary | ICD-10-CM | POA: Diagnosis present

## 2014-08-13 DIAGNOSIS — I739 Peripheral vascular disease, unspecified: Secondary | ICD-10-CM | POA: Diagnosis present

## 2014-08-13 DIAGNOSIS — I10 Essential (primary) hypertension: Secondary | ICD-10-CM | POA: Diagnosis present

## 2014-08-13 DIAGNOSIS — E876 Hypokalemia: Secondary | ICD-10-CM | POA: Diagnosis not present

## 2014-08-13 DIAGNOSIS — R059 Cough, unspecified: Secondary | ICD-10-CM

## 2014-08-13 DIAGNOSIS — R140 Abdominal distension (gaseous): Secondary | ICD-10-CM

## 2014-08-13 DIAGNOSIS — R0689 Other abnormalities of breathing: Secondary | ICD-10-CM

## 2014-08-13 DIAGNOSIS — M159 Polyosteoarthritis, unspecified: Secondary | ICD-10-CM

## 2014-08-13 DIAGNOSIS — Z515 Encounter for palliative care: Secondary | ICD-10-CM

## 2014-08-13 DIAGNOSIS — J449 Chronic obstructive pulmonary disease, unspecified: Secondary | ICD-10-CM | POA: Diagnosis present

## 2014-08-13 DIAGNOSIS — Z66 Do not resuscitate: Secondary | ICD-10-CM | POA: Diagnosis not present

## 2014-08-13 DIAGNOSIS — I2510 Atherosclerotic heart disease of native coronary artery without angina pectoris: Secondary | ICD-10-CM

## 2014-08-13 DIAGNOSIS — G9340 Encephalopathy, unspecified: Secondary | ICD-10-CM | POA: Diagnosis not present

## 2014-08-13 DIAGNOSIS — K922 Gastrointestinal hemorrhage, unspecified: Secondary | ICD-10-CM | POA: Diagnosis present

## 2014-08-13 DIAGNOSIS — J810 Acute pulmonary edema: Secondary | ICD-10-CM | POA: Diagnosis present

## 2014-08-13 DIAGNOSIS — R579 Shock, unspecified: Secondary | ICD-10-CM

## 2014-08-13 DIAGNOSIS — I129 Hypertensive chronic kidney disease with stage 1 through stage 4 chronic kidney disease, or unspecified chronic kidney disease: Secondary | ICD-10-CM | POA: Diagnosis present

## 2014-08-13 DIAGNOSIS — J189 Pneumonia, unspecified organism: Secondary | ICD-10-CM

## 2014-08-13 DIAGNOSIS — F411 Generalized anxiety disorder: Secondary | ICD-10-CM | POA: Diagnosis present

## 2014-08-13 DIAGNOSIS — R570 Cardiogenic shock: Secondary | ICD-10-CM | POA: Diagnosis not present

## 2014-08-13 DIAGNOSIS — Z7982 Long term (current) use of aspirin: Secondary | ICD-10-CM

## 2014-08-13 DIAGNOSIS — I779 Disorder of arteries and arterioles, unspecified: Secondary | ICD-10-CM | POA: Clinically undetermined

## 2014-08-13 DIAGNOSIS — R0600 Dyspnea, unspecified: Secondary | ICD-10-CM

## 2014-08-13 DIAGNOSIS — J309 Allergic rhinitis, unspecified: Secondary | ICD-10-CM

## 2014-08-13 DIAGNOSIS — Z9981 Dependence on supplemental oxygen: Secondary | ICD-10-CM

## 2014-08-13 DIAGNOSIS — R634 Abnormal weight loss: Secondary | ICD-10-CM

## 2014-08-13 DIAGNOSIS — J9601 Acute respiratory failure with hypoxia: Secondary | ICD-10-CM | POA: Diagnosis not present

## 2014-08-13 DIAGNOSIS — R0789 Other chest pain: Secondary | ICD-10-CM

## 2014-08-13 DIAGNOSIS — N189 Chronic kidney disease, unspecified: Secondary | ICD-10-CM | POA: Diagnosis present

## 2014-08-13 DIAGNOSIS — F17200 Nicotine dependence, unspecified, uncomplicated: Secondary | ICD-10-CM | POA: Diagnosis present

## 2014-08-13 DIAGNOSIS — E871 Hypo-osmolality and hyponatremia: Secondary | ICD-10-CM | POA: Diagnosis not present

## 2014-08-13 DIAGNOSIS — I255 Ischemic cardiomyopathy: Secondary | ICD-10-CM | POA: Diagnosis not present

## 2014-08-13 DIAGNOSIS — R05 Cough: Secondary | ICD-10-CM

## 2014-08-13 DIAGNOSIS — D649 Anemia, unspecified: Secondary | ICD-10-CM | POA: Diagnosis present

## 2014-08-13 DIAGNOSIS — I25110 Atherosclerotic heart disease of native coronary artery with unstable angina pectoris: Secondary | ICD-10-CM | POA: Clinically undetermined

## 2014-08-13 DIAGNOSIS — I5041 Acute combined systolic (congestive) and diastolic (congestive) heart failure: Secondary | ICD-10-CM | POA: Diagnosis not present

## 2014-08-13 DIAGNOSIS — I472 Ventricular tachycardia, unspecified: Secondary | ICD-10-CM

## 2014-08-13 DIAGNOSIS — J9600 Acute respiratory failure, unspecified whether with hypoxia or hypercapnia: Secondary | ICD-10-CM | POA: Diagnosis not present

## 2014-08-13 DIAGNOSIS — I4729 Other ventricular tachycardia: Secondary | ICD-10-CM | POA: Diagnosis not present

## 2014-08-13 DIAGNOSIS — I5043 Acute on chronic combined systolic (congestive) and diastolic (congestive) heart failure: Secondary | ICD-10-CM | POA: Diagnosis present

## 2014-08-13 DIAGNOSIS — N179 Acute kidney failure, unspecified: Secondary | ICD-10-CM

## 2014-08-13 DIAGNOSIS — I214 Non-ST elevation (NSTEMI) myocardial infarction: Secondary | ICD-10-CM | POA: Diagnosis not present

## 2014-08-13 DIAGNOSIS — E785 Hyperlipidemia, unspecified: Secondary | ICD-10-CM | POA: Diagnosis present

## 2014-08-13 DIAGNOSIS — I2582 Chronic total occlusion of coronary artery: Secondary | ICD-10-CM | POA: Diagnosis present

## 2014-08-13 DIAGNOSIS — K625 Hemorrhage of anus and rectum: Secondary | ICD-10-CM

## 2014-08-13 DIAGNOSIS — Z951 Presence of aortocoronary bypass graft: Secondary | ICD-10-CM

## 2014-08-13 LAB — COMPREHENSIVE METABOLIC PANEL
ALBUMIN: 4.1 g/dL (ref 3.5–5.2)
ALK PHOS: 86 U/L (ref 39–117)
ALT: 12 U/L (ref 0–53)
ANION GAP: 14 (ref 5–15)
AST: 17 U/L (ref 0–37)
BUN: 18 mg/dL (ref 6–23)
CO2: 25 mEq/L (ref 19–32)
Calcium: 9.9 mg/dL (ref 8.4–10.5)
Chloride: 99 mEq/L (ref 96–112)
Creatinine, Ser: 1.14 mg/dL (ref 0.50–1.35)
GFR calc Af Amer: 73 mL/min — ABNORMAL LOW (ref >90)
GFR calc non Af Amer: 63 mL/min — ABNORMAL LOW (ref >90)
GLUCOSE: 97 mg/dL (ref 70–99)
POTASSIUM: 4.7 meq/L (ref 3.7–5.3)
Sodium: 138 mEq/L (ref 137–147)
Total Bilirubin: 0.4 mg/dL (ref 0.3–1.2)
Total Protein: 8.2 g/dL (ref 6.0–8.3)

## 2014-08-13 LAB — CBC WITH DIFFERENTIAL/PLATELET
Basophils Absolute: 0 10*3/uL (ref 0.0–0.1)
Basophils Relative: 0 % (ref 0–1)
Eosinophils Absolute: 0.2 10*3/uL (ref 0.0–0.7)
Eosinophils Relative: 3 % (ref 0–5)
HCT: 35 % — ABNORMAL LOW (ref 39.0–52.0)
HEMOGLOBIN: 11.5 g/dL — AB (ref 13.0–17.0)
LYMPHS PCT: 23 % (ref 12–46)
Lymphs Abs: 1.9 10*3/uL (ref 0.7–4.0)
MCH: 29.2 pg (ref 26.0–34.0)
MCHC: 32.9 g/dL (ref 30.0–36.0)
MCV: 88.8 fL (ref 78.0–100.0)
MONO ABS: 0.6 10*3/uL (ref 0.1–1.0)
MONOS PCT: 7 % (ref 3–12)
Neutro Abs: 5.5 10*3/uL (ref 1.7–7.7)
Neutrophils Relative %: 67 % (ref 43–77)
Platelets: 185 10*3/uL (ref 150–400)
RBC: 3.94 MIL/uL — ABNORMAL LOW (ref 4.22–5.81)
RDW: 13.1 % (ref 11.5–15.5)
WBC: 8.3 10*3/uL (ref 4.0–10.5)

## 2014-08-13 LAB — I-STAT CG4 LACTIC ACID, ED: Lactic Acid, Venous: 0.91 mmol/L (ref 0.5–2.2)

## 2014-08-13 LAB — PROTIME-INR
INR: 0.97 (ref 0.00–1.49)
Prothrombin Time: 12.9 seconds (ref 11.6–15.2)

## 2014-08-13 LAB — ABO/RH: ABO/RH(D): O POS

## 2014-08-13 LAB — TROPONIN I
Troponin I: <0.3 ng/mL (ref <0.30)
Troponin I: <0.3 ng/mL (ref <0.30)

## 2014-08-13 LAB — POC OCCULT BLOOD, ED: Fecal Occult Bld: POSITIVE — AB

## 2014-08-13 LAB — LIPASE, BLOOD: Lipase: 52 U/L (ref 11–59)

## 2014-08-13 MED ORDER — CLOPIDOGREL BISULFATE 75 MG PO TABS
75.0000 mg | ORAL_TABLET | Freq: Every day | ORAL | Status: DC
  Administered 2014-08-13 – 2014-08-15 (×3): 75 mg via ORAL
  Filled 2014-08-13 (×5): qty 1

## 2014-08-13 MED ORDER — SODIUM CHLORIDE 0.9 % IV SOLN
1000.0000 mL | Freq: Once | INTRAVENOUS | Status: AC
  Administered 2014-08-13: 1000 mL via INTRAVENOUS

## 2014-08-13 MED ORDER — SODIUM CHLORIDE 0.9 % IJ SOLN
3.0000 mL | Freq: Two times a day (BID) | INTRAMUSCULAR | Status: DC
  Administered 2014-08-13 – 2014-08-20 (×7): 3 mL via INTRAVENOUS
  Administered 2014-08-21: 10 mL via INTRAVENOUS
  Administered 2014-08-25 – 2014-08-29 (×3): 3 mL via INTRAVENOUS

## 2014-08-13 MED ORDER — ATENOLOL 50 MG PO TABS
50.0000 mg | ORAL_TABLET | Freq: Two times a day (BID) | ORAL | Status: DC
  Administered 2014-08-13: 50 mg via ORAL
  Filled 2014-08-13 (×3): qty 1

## 2014-08-13 MED ORDER — DOCUSATE SODIUM 100 MG PO CAPS
100.0000 mg | ORAL_CAPSULE | Freq: Two times a day (BID) | ORAL | Status: DC
  Administered 2014-08-13 – 2014-08-15 (×4): 100 mg via ORAL
  Filled 2014-08-13 (×5): qty 1

## 2014-08-13 MED ORDER — IOHEXOL 300 MG/ML  SOLN
50.0000 mL | Freq: Once | INTRAMUSCULAR | Status: AC | PRN
  Administered 2014-08-13: 50 mL via ORAL

## 2014-08-13 MED ORDER — IOHEXOL 300 MG/ML  SOLN
100.0000 mL | Freq: Once | INTRAMUSCULAR | Status: AC | PRN
  Administered 2014-08-13: 100 mL via INTRAVENOUS

## 2014-08-13 MED ORDER — ONDANSETRON HCL 4 MG PO TABS: 4.0000 mg | ORAL_TABLET | Freq: Four times a day (QID) | ORAL | Status: DC | PRN

## 2014-08-13 MED ORDER — ALBUTEROL SULFATE (2.5 MG/3ML) 0.083% IN NEBU
3.0000 mL | INHALATION_SOLUTION | Freq: Four times a day (QID) | RESPIRATORY_TRACT | Status: DC | PRN
  Administered 2014-08-14: 3 mL via RESPIRATORY_TRACT
  Filled 2014-08-13: qty 3

## 2014-08-13 MED ORDER — MOMETASONE FURO-FORMOTEROL FUM 200-5 MCG/ACT IN AERO
2.0000 | INHALATION_SPRAY | Freq: Two times a day (BID) | RESPIRATORY_TRACT | Status: DC
  Administered 2014-08-13 – 2014-08-15 (×4): 2 via RESPIRATORY_TRACT
  Filled 2014-08-13: qty 8.8

## 2014-08-13 MED ORDER — SODIUM CHLORIDE 0.9 % IV SOLN: 250.0000 mL | INTRAVENOUS | Status: DC | PRN

## 2014-08-13 MED ORDER — POLYETHYLENE GLYCOL 3350 17 G PO PACK
17.0000 g | PACK | Freq: Every day | ORAL | Status: DC | PRN
  Filled 2014-08-13: qty 1

## 2014-08-13 MED ORDER — HYDROCORTISONE 2.5 % RE CREA
TOPICAL_CREAM | Freq: Two times a day (BID) | RECTAL | Status: DC
  Administered 2014-08-17 – 2014-08-25 (×3): via RECTAL
  Administered 2014-08-28: 1 via RECTAL
  Administered 2014-08-29 – 2014-09-03 (×5): via RECTAL
  Filled 2014-08-13 (×2): qty 28.35

## 2014-08-13 MED ORDER — PRAZOSIN HCL 1 MG PO CAPS
1.0000 mg | ORAL_CAPSULE | Freq: Two times a day (BID) | ORAL | Status: DC
  Administered 2014-08-13: 1 mg via ORAL
  Filled 2014-08-13 (×3): qty 1

## 2014-08-13 MED ORDER — SODIUM CHLORIDE 0.9 % IJ SOLN: 3.0000 mL | INTRAMUSCULAR | Status: DC | PRN

## 2014-08-13 MED ORDER — VITAMIN B-1 100 MG PO TABS
100.0000 mg | ORAL_TABLET | Freq: Every day | ORAL | Status: DC
  Administered 2014-08-13 – 2014-08-15 (×3): 100 mg via ORAL
  Filled 2014-08-13 (×3): qty 1

## 2014-08-13 MED ORDER — ONDANSETRON HCL 4 MG/2ML IJ SOLN: 4.0000 mg | Freq: Four times a day (QID) | INTRAMUSCULAR | Status: DC | PRN

## 2014-08-13 MED ORDER — PANTOPRAZOLE SODIUM 40 MG IV SOLR
40.0000 mg | Freq: Once | INTRAVENOUS | Status: AC
  Administered 2014-08-13: 40 mg via INTRAVENOUS
  Filled 2014-08-13: qty 40

## 2014-08-13 MED ORDER — ONDANSETRON HCL 4 MG/2ML IJ SOLN
4.0000 mg | Freq: Once | INTRAMUSCULAR | Status: AC
  Administered 2014-08-13: 4 mg via INTRAVENOUS
  Filled 2014-08-13: qty 2

## 2014-08-13 MED ORDER — ATORVASTATIN CALCIUM 40 MG PO TABS
40.0000 mg | ORAL_TABLET | Freq: Every day | ORAL | Status: DC
  Administered 2014-08-14 – 2014-08-15 (×2): 40 mg via ORAL
  Filled 2014-08-13 (×2): qty 1

## 2014-08-13 MED ORDER — LISINOPRIL 20 MG PO TABS
20.0000 mg | ORAL_TABLET | Freq: Every day | ORAL | Status: DC
  Administered 2014-08-13: 20 mg via ORAL
  Filled 2014-08-13 (×2): qty 1

## 2014-08-13 MED ORDER — ACETAMINOPHEN 650 MG RE SUPP: 650.0000 mg | Freq: Four times a day (QID) | RECTAL | Status: DC | PRN

## 2014-08-13 MED ORDER — SODIUM CHLORIDE 0.9 % IV SOLN: INTRAVENOUS | Status: DC

## 2014-08-13 MED ORDER — SODIUM CHLORIDE 0.9 % IJ SOLN
3.0000 mL | Freq: Two times a day (BID) | INTRAMUSCULAR | Status: DC
  Administered 2014-08-16 – 2014-08-20 (×5): 3 mL via INTRAVENOUS
  Administered 2014-08-21: 10 mL via INTRAVENOUS
  Administered 2014-08-21 – 2014-09-02 (×19): 3 mL via INTRAVENOUS

## 2014-08-13 MED ORDER — TRIAMCINOLONE ACETONIDE 0.1 % EX CREA
TOPICAL_CREAM | Freq: Two times a day (BID) | CUTANEOUS | Status: DC
  Administered 2014-08-16 – 2014-08-18 (×5): via TOPICAL
  Administered 2014-08-23: 1 via TOPICAL
  Administered 2014-08-25: 09:00:00 via TOPICAL
  Administered 2014-08-26 – 2014-08-28 (×5): 1 via TOPICAL
  Administered 2014-08-29 – 2014-09-03 (×5): via TOPICAL
  Filled 2014-08-13 (×3): qty 15

## 2014-08-13 MED ORDER — MORPHINE SULFATE 2 MG/ML IJ SOLN: 1.0000 mg | INTRAMUSCULAR | Status: DC | PRN

## 2014-08-13 MED ORDER — ACETAMINOPHEN 325 MG PO TABS: 650.0000 mg | ORAL_TABLET | Freq: Four times a day (QID) | ORAL | Status: DC | PRN

## 2014-08-13 MED ORDER — SODIUM CHLORIDE 0.9 % IV SOLN
1000.0000 mL | INTRAVENOUS | Status: DC
  Administered 2014-08-13 – 2014-08-14 (×3): 1000 mL via INTRAVENOUS

## 2014-08-13 MED ORDER — ADULT MULTIVITAMIN W/MINERALS CH
1.0000 | ORAL_TABLET | Freq: Every day | ORAL | Status: DC
  Administered 2014-08-13 – 2014-08-15 (×3): 1 via ORAL
  Filled 2014-08-13 (×3): qty 1

## 2014-08-13 MED ORDER — OXYCODONE HCL 5 MG PO TABS
5.0000 mg | ORAL_TABLET | ORAL | Status: DC | PRN
  Administered 2014-08-13 – 2014-08-14 (×2): 5 mg via ORAL
  Filled 2014-08-13 (×2): qty 1

## 2014-08-13 MED ORDER — FOLIC ACID 1 MG PO TABS
1.0000 mg | ORAL_TABLET | Freq: Every day | ORAL | Status: DC
  Administered 2014-08-13 – 2014-08-15 (×3): 1 mg via ORAL
  Filled 2014-08-13 (×3): qty 1

## 2014-08-13 NOTE — H&P (Signed)
[facility] History and Physical  Douglas Mann XNA:[id number] DOB: February 01, 1946 DOA: 08/13/2014  Referring physician: Ezequiel Essex, MD PCP: [name], MD   Chief Complaint: Chest Pain  HPI: Douglas Mann is a 68 y.o. male presents with pain in his chest. He originally came in for lower GI bleeding symptoms. He states that he has noted BRBPR and this has actually been going on for 2 years. Patient was planned on being discharged but he than started complaining about chest pain. Patient states that pain is in the lower chest upper abdomen. Patient states he has no nausea or vomiting. He has no fevers or chills. Patient does not have any radiation of the pain. Patient states that he has no edema noted. He has had no syncope noted. 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 He continues to smoke.   Review of Systems:  Complete 12 point ROS done and is unremarkable other than what is noted in HPI  Past Medical History  Diagnosis Date  . HYPERLIPIDEMIA 10/01/2009  . HYPERTENSION 10/01/2009  . CAD 10/01/2009  . PVD 10/01/2009  . ALLERGIC RHINITIS 12/24/2009  . COPD 10/01/2009  . 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 COUGH, CHRONIC 01/28/2010   Past Surgical History  Procedure Laterality Date  . Fracture surgery  2012    ORIF r tibia fracture   Social History:  reports that he has been smoking Cigarettes.  He has a 1 pack-year smoking history. He has never used smokeless tobacco. He reports that he does not drink alcohol or use illicit drugs.  No Known Allergies  Family History  Problem Relation Age of Onset  . Heart disease Other      Prior to Admission medications   Medication Sig Start Date End Date Taking? Authorizing Provider  albuterol (VENTOLIN HFA) 108 (90 BASE) MCG/ACT inhaler Inhale 2 puffs into the lungs every 6 (six) hours as needed. 09/17/11  Yes [name], MD  aspirin 81 MG tablet Take 81 mg by mouth daily.     Yes  Historical Provider, MD  atenolol (TENORMIN) 50 MG tablet take 1 tablet by mouth twice a day 12/24/13  Yes [name], MD  clopidogrel (PLAVIX) 75 MG tablet take 1 tablet by mouth once daily 12/24/13  Yes [name], MD  fish oil-omega-3 fatty acids 1000 MG capsule Take 2 g by mouth daily.     Yes Historical Provider, MD  Fluticasone-Salmeterol (ADVAIR) 500-50 MCG/DOSE AEPB Inhale 1 puff into the lungs 2 (two) times daily.   Yes Historical Provider, MD  lisinopril (PRINIVIL,ZESTRIL) 20 MG tablet take 1 tablet by mouth once daily 12/24/13  Yes [name], MD  NON FORMULARY O2 per nasal canula continuous @@ 2 liters   Yes Historical Provider, MD  prazosin (MINIPRESS) 1 MG capsule take 1 capsule by mouth twice a day 12/24/13  Yes [name], MD  PROCTOSOL HC 2.5 % rectal cream apply rectally twice a day 03/27/12  Yes [name], MD  simvastatin (ZOCOR) 80 MG tablet take one half tablet daily 06/20/14  Yes [name], MD  triamcinolone cream (KENALOG) 0.1 % Apply topically 2 (two) times daily. 08/07/14  Yes [name], MD   Physical Exam: Filed Vitals:   08/13/14 1715 08/13/14 1800 08/13/14 1830 08/13/14 1900  BP: 147/76 123/66 128/67 123/58  Pulse:  91 93 87  Temp:      TempSrc:      Resp: 19  14 19   SpO2:  100% 100% 100%    Wt Readings from Last 3 Encounters:  08/07/14 73.029 kg (161 lb)  09/14/13 69.4 kg (153 lb)  12/07/12 66.679 kg (147 lb)    General:  Appears calm and comfortable Eyes: PERRL, normal lids, irises & conjunctiva ENT: grossly normal hearing, lips & tongue Neck: no LAD, masses or thyromegaly Cardiovascular: RRR, no m/r/g. No LE edema. Telemetry: SR Respiratory: CTA bilaterally, no w/r/r. Normal respiratory effort. Abdomen: soft, ++epigastric tenderness no rebound Skin: no rash or induration seen on limited exam Musculoskeletal: grossly normal tone Psychiatric: grossly normal mood and affect, speech fluent and  appropriate Neurologic: grossly non-focal.          Labs on Admission:  Basic Metabolic Panel:  Recent Labs Lab 08/07/14 1018 08/13/14 1620  NA 136 138  K 5.4* 4.7  CL 98 99  CO2 31 25  GLUCOSE 101* 97  BUN 12 18  CREATININE 1.1 1.14  CALCIUM 9.7 9.9   Liver Function Tests:  Recent Labs Lab 08/07/14 1018 08/13/14 1620  AST 19 17  ALT 13 12  ALKPHOS 76 86  BILITOT 0.8 0.4  PROT 8.1 8.2  ALBUMIN 4.2 4.1    Recent Labs Lab 08/13/14 1620  LIPASE 52   No results found for this basename: AMMONIA,  in the last 168 hours CBC:  Recent Labs Lab 08/07/14 1018 08/13/14 1620  WBC 10.4 8.3  NEUTROABS 6.9 5.5  HGB 12.1* 11.5*  HCT 37.0* 35.0*  MCV 91.3 88.8  PLT 213.0 185   Cardiac Enzymes:  Recent Labs Lab 08/13/14 1620  TROPONINI <0.30    BNP (last 3 results) No results found for this basename: PROBNP,  in the last 8760 hours CBG: No results found for this basename: GLUCAP,  in the last 168 hours  Radiological Exams on Admission: Ct Abdomen Pelvis W Contrast  08/13/2014   CLINICAL DATA:  GI bleeding.  EXAM: CT ABDOMEN AND PELVIS WITH CONTRAST  TECHNIQUE: Multidetector CT imaging of the abdomen and pelvis was performed using the standard protocol following bolus administration of intravenous contrast.  CONTRAST:  18mL OMNIPAQUE IOHEXOL 300 MG/ML SOLN, 167mL OMNIPAQUE IOHEXOL 300 MG/ML SOLN  COMPARISON:  None.  FINDINGS: The lung bases demonstrate a focal calcification over the post to medial right duct matter border. There is minimal linear scarring over the right base.  Abdominal images demonstrate several calcified splenic granulomas. There is mild cholelithiasis. The liver, pancreas and adrenal glands are within normal. Kidneys normal in size without hydronephrosis or focal mass. There are a couple small calcifications adjacent the left renal hilum likely vascular. Ureters are normal. The appendix is normal. There is diverticulosis throughout the colon  including the cecum. There is calcified plaque involving the abdominal aorta and iliac vessels. There is minimal dilatation of the infrarenal abdominal aorta measuring 2.7 cm in AP diameter.  Pelvic images demonstrate the bladder, prostate and rectum to be within normal. There is no free fluid or inflammatory change noted. Hardware is present over the left proximal femur. There are mild degenerative changes of the spine and hips.  IMPRESSION: No acute findings in the abdomen/pelvis.  Moderate diverticulosis throughout the colon without active inflammation.  Mild cholelithiasis.   Electronically Signed   By: Marin Olp M.D.   On: 08/13/2014 17:52     Assessment/Plan Principal Problem:   Chest pain Active Problems:   HYPERLIPIDEMIA   HYPERTENSION   COPD   GI bleed   1.  Chest Pain -will admit for observation -check serial enzymes -pain is atypical appears to be more GI related  2. Lower GI bleed -likely hemmorhoidal bleeding by his history -will monitor his hgb -may consider workup as an outpatient  3. Hyperlipidemia -will continue with his statins  4. Hypertension -will continue with home medications  5. COPD -continue with inhalers  Code Status: Full Code (must indicate code status--if unknown or must be presumed, indicate so) DVT Prophylaxis:SCDs Family Communication: Daughter (indicate person spoken with, if applicable, with phone number if by telephone) Disposition Plan: Home (indicate anticipated LOS)  Time spent: 65min  KHAN,SAADAT A [facility] Pager [phone]  **Disclaimer: This note may have been dictated with voice recognition software. Similar sounding words can inadvertently be transcribed and this note may contain transcription errors which may not have been corrected upon publication of note.**

## 2014-08-13 NOTE — ED Notes (Signed)
MD at bedside. 

## 2014-08-13 NOTE — ED Notes (Signed)
Patient's daughter states that the patient began having the dark stools with blood about 2 weeks ago and actually had an appointment for tomorrow to have a CT of his abdomen, but he called and cancelled because he was coming here.

## 2014-08-13 NOTE — ED Provider Notes (Signed)
CSN: [id number]     Arrival date & time 08/13/14  1543 History   First MD Initiated Contact with Patient 08/13/14 1551     Chief Complaint  Patient presents with  . GI Bleeding     (Consider location/radiation/quality/duration/timing/severity/associated sxs/prior Treatment) HPI Comments: Patient presents with intermittent rectal bleeding for the past 2 weeks. He states his stools are brown and associated with bright red blood on toilet bowl and bright red blood with wiping. She denies any black stools. He states he strains in the toilet occasionally and has a history of hemorrhoids. He is on aspirin and Plavix. He endorses discomfort in his abdomen but not pain. He said his PCP last week and was scheduled for an outpatient ultrasound. He decided to come to the hospital today because he had worsening bleeding this morning. Denies any dizziness, lightheadedness, chest pain or shortness of breath. He takes aspirin and Plavix. Not Coumadin. He denies any excessive alcohol abuse or anti-inflammatory medication abuse.  The history is provided by the patient and the EMS personnel.    Past Medical History  Diagnosis Date  . HYPERLIPIDEMIA 10/01/2009  . HYPERTENSION 10/01/2009  . CAD 10/01/2009  . PVD 10/01/2009  . ALLERGIC RHINITIS 12/24/2009  . COPD 10/01/2009  . 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 COUGH, CHRONIC 01/28/2010   Past Surgical History  Procedure Laterality Date  . Fracture surgery  2012    ORIF r tibia fracture   Family History  Problem Relation Age of Onset  . Heart disease Other    History  Substance Use Topics  . Smoking status: Current Every Day Smoker -- 1.00 packs/day for 1 years    Types: Cigarettes  . Smokeless tobacco: Never Used  . Alcohol Use: No    Review of Systems  Constitutional: Positive for activity change and appetite change. Negative for fever.  HENT: Negative for congestion and rhinorrhea.   Respiratory: Negative for cough,  chest tightness and shortness of breath.   Cardiovascular: Negative for chest pain.  Gastrointestinal: Positive for nausea, abdominal pain, blood in stool and anal bleeding. Negative for vomiting.  Genitourinary: Positive for testicular pain. Negative for dysuria and hematuria.  Musculoskeletal: Negative for arthralgias and myalgias.  Skin: Negative for rash.  Neurological: Negative for dizziness, weakness and headaches.  A complete 10 system review of systems was obtained and all systems are negative except as noted in the HPI and PMH.      Allergies  Review of patient's allergies indicates no known allergies.  Home Medications   Prior to Admission medications   Medication Sig Start Date End Date Taking? Authorizing Provider  albuterol (VENTOLIN HFA) 108 (90 BASE) MCG/ACT inhaler Inhale 2 puffs into the lungs every 6 (six) hours as needed. 09/17/11  Yes [name], MD  aspirin 81 MG tablet Take 81 mg by mouth daily.     Yes Historical Provider, MD  atenolol (TENORMIN) 50 MG tablet take 1 tablet by mouth twice a day 12/24/13  Yes [name], MD  clopidogrel (PLAVIX) 75 MG tablet take 1 tablet by mouth once daily 12/24/13  Yes [name], MD  fish oil-omega-3 fatty acids 1000 MG capsule Take 2 g by mouth daily.     Yes Historical Provider, MD  Fluticasone-Salmeterol (ADVAIR) 500-50 MCG/DOSE AEPB Inhale 1 puff into the lungs 2 (two) times daily.   Yes Historical Provider, MD  lisinopril (PRINIVIL,ZESTRIL) 20 MG tablet take 1 tablet by mouth once daily 12/24/13  Yes  [name], MD  NON FORMULARY O2 per nasal canula continuous @@ 2 liters   Yes Historical Provider, MD  prazosin (MINIPRESS) 1 MG capsule take 1 capsule by mouth twice a day 12/24/13  Yes [name], MD  PROCTOSOL HC 2.5 % rectal cream apply rectally twice a day 03/27/12  Yes [name], MD  simvastatin (ZOCOR) 80 MG tablet take one half tablet daily 06/20/14  Yes [name], MD   triamcinolone cream (KENALOG) 0.1 % Apply topically 2 (two) times daily. 08/07/14  Yes [name], MD   BP 118/61  Pulse 90  Temp(Src) 99 F (37.2 C) (Oral)  Resp 18  Ht 5\' 7"  (1.702 m)  Wt 163 lb 4.8 oz (74.072 kg)  BMI 25.57 kg/m2  SpO2 98% Physical Exam  Nursing note and vitals reviewed. Constitutional: He is oriented to person, place, and time. He appears well-developed and well-nourished. No distress.  HENT:  Head: Normocephalic and atraumatic.  Mouth/Throat: Oropharynx is clear and moist. No oropharyngeal exudate.  Eyes: Conjunctivae and EOM are normal. Pupils are equal, round, and reactive to light.  Neck: Normal range of motion. Neck supple.  No meningismus.  Cardiovascular: Normal rate, regular rhythm, normal heart sounds and intact distal pulses.   No murmur heard. Pulmonary/Chest: Effort normal. No respiratory distress. He has wheezes.  Diminished air exchange with scattered wheezing  Abdominal: Soft. There is tenderness. There is no rebound and no guarding.  Genitourinary: Guaiac positive stool.  Small external hemorrhoid, no fissue. Dark stool with pink streaks.  Musculoskeletal: Normal range of motion. He exhibits no edema and no tenderness.  Neurological: He is alert and oriented to person, place, and time. No cranial nerve deficit. He exhibits normal muscle tone. Coordination normal.  No ataxia on finger to nose bilaterally. No pronator drift. 5/5 strength throughout. CN 2-12 intact. Negative Romberg. Equal grip strength. Sensation intact. Gait is normal.   Skin: Skin is warm.  Psychiatric: He has a normal mood and affect. His behavior is normal.    ED Course  Procedures (including critical care time) Labs Review Labs Reviewed  CBC WITH DIFFERENTIAL - Abnormal; Notable for the following:    RBC 3.94 (*)    Hemoglobin 11.5 (*)    HCT 35.0 (*)    All other components within normal limits  COMPREHENSIVE METABOLIC PANEL - Abnormal; Notable for the  following:    GFR calc non Af Amer 63 (*)    GFR calc Af Amer 73 (*)    All other components within normal limits  POC OCCULT BLOOD, ED - Abnormal; Notable for the following:    Fecal Occult Bld POSITIVE (*)    All other components within normal limits  LIPASE, BLOOD  PROTIME-INR  TROPONIN I  TROPONIN I  TSH  HEMOGLOBIN A1C  COMPREHENSIVE METABOLIC PANEL  CBC  TROPONIN I  TROPONIN I  I-STAT CG4 LACTIC ACID, ED  TYPE AND SCREEN  ABO/RH    Imaging Review Ct Abdomen Pelvis W Contrast  08/13/2014   CLINICAL DATA:  GI bleeding.  EXAM: CT ABDOMEN AND PELVIS WITH CONTRAST  TECHNIQUE: Multidetector CT imaging of the abdomen and pelvis was performed using the standard protocol following bolus administration of intravenous contrast.  CONTRAST:  90mL OMNIPAQUE IOHEXOL 300 MG/ML SOLN, 175mL OMNIPAQUE IOHEXOL 300 MG/ML SOLN  COMPARISON:  None.  FINDINGS: The lung bases demonstrate a focal calcification over the post to medial right duct matter border. There is minimal linear scarring over  the right base.  Abdominal images demonstrate several calcified splenic granulomas. There is mild cholelithiasis. The liver, pancreas and adrenal glands are within normal. Kidneys normal in size without hydronephrosis or focal mass. There are a couple small calcifications adjacent the left renal hilum likely vascular. Ureters are normal. The appendix is normal. There is diverticulosis throughout the colon including the cecum. There is calcified plaque involving the abdominal aorta and iliac vessels. There is minimal dilatation of the infrarenal abdominal aorta measuring 2.7 cm in AP diameter.  Pelvic images demonstrate the bladder, prostate and rectum to be within normal. There is no free fluid or inflammatory change noted. Hardware is present over the left proximal femur. There are mild degenerative changes of the spine and hips.  IMPRESSION: No acute findings in the abdomen/pelvis.  Moderate diverticulosis throughout  the colon without active inflammation.  Mild cholelithiasis.   Electronically Signed   By: Marin Olp M.D.   On: 08/13/2014 17:52     EKG Interpretation   Date/Time:  Tuesday August 13 2014 15:51:17 EDT Ventricular Rate:  84 PR Interval:  167 QRS Duration: 102 QT Interval:  368 QTC Calculation: 435 R Axis:   82 Text Interpretation:  Sinus rhythm Inferior infarct, old Baseline wander  in lead(s) V3 ST depressions resolved. Confirmed by Wyvonnia Dusky  MD, Annie Main  [id number]) on 08/13/2014 6:00:04 PM      MDM   Final diagnoses:  Rectal bleeding  Chest pain, unspecified chest pain type   Bloody stools for the past 2 weeks with abdominal distention and pain. Vitals stable. Abdomen is soft  Patient given IV fluids and PPI. Hemoccult is positive. Vitals stable.  Hemoglobin stable. CT scan shows evidence of diverticulosis. Rectal bleeding is likely hemorrhoidal or diverticular in nature. He is in no distress. Vitals are stable.  Patient developed chest pain while sitting in the ED. This lasted about 10-15 minutes and is now resolved. EKG shows Q waves inferiorly, previous ST depressions have resolved. Troponin is negative.  From a GI bleed standpoint, patient appears stable. With his chest pain and cardiac history he'll be admitted for observation. D/w Dr. Humphrey Rolls.    Ezequiel Essex, MD 08/14/14 0010

## 2014-08-13 NOTE — ED Notes (Signed)
Bed: WA09 Expected date:  Expected time:  Means of arrival:  Comments: ems- GI bleed

## 2014-08-14 ENCOUNTER — Other Ambulatory Visit: Payer: Medicare Other

## 2014-08-14 DIAGNOSIS — I10 Essential (primary) hypertension: Secondary | ICD-10-CM

## 2014-08-14 DIAGNOSIS — R079 Chest pain, unspecified: Secondary | ICD-10-CM

## 2014-08-14 LAB — COMPREHENSIVE METABOLIC PANEL
ALT: 9 U/L (ref 0–53)
AST: 16 U/L (ref 0–37)
Albumin: 3.2 g/dL — ABNORMAL LOW (ref 3.5–5.2)
Alkaline Phosphatase: 64 U/L (ref 39–117)
Anion gap: 11 (ref 5–15)
BUN: 12 mg/dL (ref 6–23)
CO2: 24 mEq/L (ref 19–32)
Calcium: 8.7 mg/dL (ref 8.4–10.5)
Chloride: 102 mEq/L (ref 96–112)
Creatinine, Ser: 1.03 mg/dL (ref 0.50–1.35)
GFR calc Af Amer: 83 mL/min — ABNORMAL LOW (ref >90)
GFR calc non Af Amer: 72 mL/min — ABNORMAL LOW (ref >90)
Glucose, Bld: 111 mg/dL — ABNORMAL HIGH (ref 70–99)
Potassium: 4.2 mEq/L (ref 3.7–5.3)
Sodium: 137 mEq/L (ref 137–147)
Total Bilirubin: 0.3 mg/dL (ref 0.3–1.2)
Total Protein: 6.1 g/dL (ref 6.0–8.3)

## 2014-08-14 LAB — CBC
HCT: 28.6 % — ABNORMAL LOW (ref 39.0–52.0)
Hemoglobin: 9.3 g/dL — ABNORMAL LOW (ref 13.0–17.0)
MCH: 29.2 pg (ref 26.0–34.0)
MCHC: 32.5 g/dL (ref 30.0–36.0)
MCV: 89.7 fL (ref 78.0–100.0)
Platelets: 150 10*3/uL (ref 150–400)
RBC: 3.19 MIL/uL — ABNORMAL LOW (ref 4.22–5.81)
RDW: 13.2 % (ref 11.5–15.5)
WBC: 8.1 10*3/uL (ref 4.0–10.5)

## 2014-08-14 LAB — HEMOGLOBIN A1C
HEMOGLOBIN A1C: 5.8 % — AB (ref <5.7)
Mean Plasma Glucose: 120 mg/dL — ABNORMAL HIGH (ref <117)

## 2014-08-14 LAB — HEMOGLOBIN AND HEMATOCRIT, BLOOD
HCT: 29.8 % — ABNORMAL LOW (ref 39.0–52.0)
HEMOGLOBIN: 9.8 g/dL — AB (ref 13.0–17.0)

## 2014-08-14 LAB — TYPE AND SCREEN
ABO/RH(D): O POS
Antibody Screen: NEGATIVE

## 2014-08-14 LAB — TSH: TSH: 1.71 u[IU]/mL (ref 0.350–4.500)

## 2014-08-14 LAB — TROPONIN I: Troponin I: <0.3 ng/mL (ref <0.30)

## 2014-08-14 MED ORDER — NICOTINE 21 MG/24HR TD PT24
21.0000 mg | MEDICATED_PATCH | Freq: Every day | TRANSDERMAL | Status: DC
  Filled 2014-08-14 (×2): qty 1

## 2014-08-14 MED ORDER — CAPSAICIN 0.025 % EX CREA
TOPICAL_CREAM | Freq: Two times a day (BID) | CUTANEOUS | Status: DC
  Administered 2014-08-17 – 2014-08-18 (×3): via TOPICAL
  Filled 2014-08-14: qty 113.2
  Filled 2014-08-14: qty 60

## 2014-08-14 MED ORDER — CETYLPYRIDINIUM CHLORIDE 0.05 % MT LIQD
7.0000 mL | Freq: Two times a day (BID) | OROMUCOSAL | Status: DC
  Administered 2014-08-14 (×3): 7 mL via OROMUCOSAL

## 2014-08-14 MED ORDER — ENSURE COMPLETE PO LIQD
237.0000 mL | Freq: Two times a day (BID) | ORAL | Status: DC
  Administered 2014-08-14: 237 mL via ORAL

## 2014-08-14 MED ORDER — SUCRALFATE 1 GM/10ML PO SUSP
1.0000 g | Freq: Three times a day (TID) | ORAL | Status: DC
  Administered 2014-08-14 – 2014-08-19 (×15): 1 g via ORAL
  Filled 2014-08-14 (×24): qty 10

## 2014-08-14 MED ORDER — IPRATROPIUM-ALBUTEROL 0.5-2.5 (3) MG/3ML IN SOLN
3.0000 mL | Freq: Three times a day (TID) | RESPIRATORY_TRACT | Status: DC
  Administered 2014-08-14 – 2014-08-15 (×2): 3 mL via RESPIRATORY_TRACT
  Filled 2014-08-14 (×3): qty 3

## 2014-08-14 MED ORDER — PANTOPRAZOLE SODIUM 40 MG PO TBEC
40.0000 mg | DELAYED_RELEASE_TABLET | Freq: Two times a day (BID) | ORAL | Status: DC
  Administered 2014-08-14 – 2014-08-15 (×2): 40 mg via ORAL
  Filled 2014-08-14 (×5): qty 1

## 2014-08-14 MED ORDER — OXYCODONE HCL 5 MG PO TABS
5.0000 mg | ORAL_TABLET | ORAL | Status: DC | PRN
  Administered 2014-08-14 – 2014-08-15 (×3): 5 mg via ORAL
  Filled 2014-08-14 (×3): qty 1

## 2014-08-14 MED ORDER — SODIUM CHLORIDE 0.9 % IV SOLN
INTRAVENOUS | Status: DC
  Administered 2014-08-14 – 2014-08-15 (×3): via INTRAVENOUS

## 2014-08-14 NOTE — Evaluation (Signed)
Physical Therapy Evaluation Patient Details Name: Douglas Mann MRN: [id number] DOB: 15-Apr-1946 Today's Date: 08/14/2014   History of Present Illness  68 yo male admitted with chest pain, GI bleed. Hx of HTN, COPD-O2 dep. Pt is from home alone.   Clinical Impression  On eval, pt required Min guard assist for mobility-able to ambulate ~100 feet without assistive device. Tolerated fairly well-did note some dyspnea and chest pain (2/10) with ambulation. Assisted pt back to bed. Recommend daily mobility with nursing supervision during hospital stay.     Follow Up Recommendations No PT follow up    Equipment Recommendations   (pt states he has all DME at home)    Recommendations for Other Services OT consult     Precautions / Restrictions Precautions Precautions: Fall Precaution Comments: O2 dep Restrictions Weight Bearing Restrictions: No      Mobility  Bed Mobility Overal bed mobility: Modified Independent                Transfers Overall transfer level: Modified independent                  Ambulation/Gait Ambulation/Gait assistance: Min guard Ambulation Distance (Feet): 100 Feet Assistive device: None Gait Pattern/deviations: Step-through pattern;Decreased stride length     General Gait Details: slow gait speed. slightly unsteady but no LOB. Ambulated on 4L O2 at pts request-usually does this at home. 1 brief standing rest break. Dyspnea 2/4.   Stairs            Wheelchair Mobility    Modified Rankin (Stroke Patients Only)       Balance Overall balance assessment: Needs assistance         Standing balance support: No upper extremity supported;During functional activity Standing balance-Leahy Scale: Good                               Pertinent Vitals/Pain Pain Assessment: 0-10 Pain Score: 2  Pain Location: chest with activity Pain Intervention(s): Limited activity within patient's tolerance    Home Living Family/patient  expects to be discharged to:: Private residence Living Arrangements: Alone   Type of Home: Apartment Home Access: Stairs to enter   [employer] of Steps: 1 Home Layout: One level Home Equipment: Walker - 2 wheels;Cane - single point;Crutches      Prior Function Level of Independence: Independent               Hand Dominance        Extremity/Trunk Assessment   Upper Extremity Assessment: Overall WFL for tasks assessed           Lower Extremity Assessment: Generalized weakness      Cervical / Trunk Assessment: Normal  Communication   Communication: No difficulties  Cognition Arousal/Alertness: Awake/alert Behavior During Therapy: WFL for tasks assessed/performed Overall Cognitive Status: Within Functional Limits for tasks assessed                      General Comments      Exercises        Assessment/Plan    PT Assessment Patient needs continued PT services  PT Diagnosis Difficulty walking;Generalized weakness;Acute pain   PT Problem List Decreased strength;Decreased activity tolerance;Decreased balance;Decreased mobility;Pain  PT Treatment Interventions Gait training;Functional mobility training;Therapeutic activities;Patient/family education;Balance training   PT Goals (Current goals can be found in the Care Plan section) Acute Rehab PT Goals Patient Stated Goal: less pain. home  soon PT Goal Formulation: With patient Time For Goal Achievement: 08/21/14 Potential to Achieve Goals: Good    Frequency Min 3X/week   Barriers to discharge        Co-evaluation               End of Session Equipment Utilized During Treatment: Oxygen Activity Tolerance: Patient limited by fatigue;Patient limited by pain Patient left: in bed;with call bell/phone within reach;with bed alarm set      Functional Assessment Tool Used: clinical observation Functional Limitation: Mobility: Walking and moving around Mobility: Walking and  Moving Around Current Status (B6384): At least 1 percent but less than 20 percent impaired, limited or restricted Mobility: Walking and Moving Around Goal Status [phone]): 0 percent impaired, limited or restricted    Time: 1426-1449 PT Time Calculation (min): 23 min   Charges:   PT Evaluation $Initial PT Evaluation Tier I: 1 Procedure PT Treatments $Gait Training: 8-22 mins $Therapeutic Activity: 8-22 mins   PT G Codes:   Functional Assessment Tool Used: clinical observation Functional Limitation: Mobility: Walking and moving around    [employer], MPT Pager: [phone]

## 2014-08-14 NOTE — Care Management Note (Signed)
CARE MANAGEMENT NOTE 08/14/2014  Patient:  KHAMRON, GELLERT   Account Number:  [id number]  Date Initiated:  08/14/2014  Documentation initiated by:  Leafy Kindle  Subjective/Objective Assessment:   68 yo pt admitted with chest pain and rectal bleed.     Action/Plan:   From home alone.  Has home O2 from [facility]   Anticipated DC Date:  08/17/2014   Anticipated DC Plan:  [place]  CM consult      PAC Choice  DURABLE MEDICAL EQUIPMENT   Choice offered to / List presented to:             Status of service:  In process, will continue to follow Medicare Important Message given?   (If response is "NO", the following Medicare IM given date fields will be blank) Date Medicare IM given:   Medicare IM given by:   Date Additional Medicare IM given:   Additional Medicare IM given by:    Discharge Disposition:    Per UR Regulation:  Reviewed for med. necessity/level of care/duration of stay  If discussed at [facility] of Stay Meetings, dates discussed:    Comments:  08/14/14 Pt from home alone.  Has home O2 from [facility].  [employer]-DME rep called to inform of pt services.  Pt states that he has a travel O2 tank that his daughter can bring to the hospital for him to use on the way home.  Will await PT eval and recommendations and assist with DC needs as needed.

## 2014-08-14 NOTE — Progress Notes (Signed)
INITIAL NUTRITION ASSESSMENT  DOCUMENTATION CODES Per approved criteria  -Not Applicable   INTERVENTION: -  Ensure Complete po BID, each supplement provides 350 kcal and 13 grams of protein.  Coupons also provided. -  Discussed need to improve nutrition quality of diet and eat more regularly.   -  RD to follow.    NUTRITION DIAGNOSIS: Inadequate oral intake related to altered GI function as evidenced by patient report of pain with eating..   Goal: Patient to meet >90% estimated needs with meals and supplements.  Monitor:  Intake, labs, weight trend  Reason for Assessment: MST  68 y.o. male  Admitting Dx: Chest pain  ASSESSMENT: Patient presented with chest pain and lower GI bleed.  Reports that his appetite is poor, hasn't been able to taste anything for the past 2-3 years since his last MI.  Patient states that he was 120 lbs 1 year ago. Per e-chart, patient was 127 lbs 3 years ago.  Eats only 1 meal per day and drinks 1 Ensure daily.  Cannot cook secondary to gas stove and use of oxygen.  Receives meals on wheels.  States that if he eats more than once daily, he has severe pain at stomach between ribs.  States that he has gained weight by sucking on butterfingers.    Height: Ht Readings from Last 1 Encounters:  08/13/14 5\' 7"  (1.702 m)    Weight: Wt Readings from Last 1 Encounters:  08/13/14 163 lb 4.8 oz (74.072 kg)    Ideal Body Weight: 148 lbs  % Ideal Body Weight: 110  Wt Readings from Last 10 Encounters:  08/13/14 163 lb 4.8 oz (74.072 kg)  08/07/14 161 lb (73.029 kg)  09/14/13 153 lb (69.4 kg)  12/07/12 147 lb (66.679 kg)  05/09/12 140 lb (63.504 kg)  07/26/11 127 lb (57.607 kg)  01/25/11 132 lb (59.875 kg)  12/23/10 133 lb (60.328 kg)  01/28/10 149 lb (67.586 kg)  12/24/09 150 lb (68.04 kg)    Usual Body Weight: 153 lbs 1 year ago  % Usual Body Weight: 107  BMI:  Body mass index is 25.57 kg/(m^2).  Estimated Nutritional Needs: Kcal:  1800-1900 Protein: 70-80 gm Fluid: 1.8L daily  Skin: intact  Diet Order:  Heart Health/CHO MOD  EDUCATION NEEDS: -Education needs addressed   Intake/Output Summary (Last 24 hours) at 08/14/14 1326 Last data filed at 08/14/14 1227  Gross per 24 hour  Intake 2497.91 ml  Output   2100 ml  Net 397.91 ml     Labs:   Recent Labs Lab 08/13/14 1620 08/14/14 0220  NA 138 137  K 4.7 4.2  CL 99 102  CO2 25 24  BUN 18 12  CREATININE 1.14 1.03  CALCIUM 9.9 8.7  GLUCOSE 97 111*    CBG (last 3)  No results found for this basename: GLUCAP,  in the last 72 hours  Scheduled Meds: . antiseptic oral rinse  7 mL Mouth Rinse BID  . atorvastatin  40 mg Oral q1800  . clopidogrel  75 mg Oral Daily  . docusate sodium  100 mg Oral BID  . folic acid  1 mg Oral Daily  . hydrocortisone   Rectal BID  . mometasone-formoterol  2 puff Inhalation BID  . multivitamin with minerals  1 tablet Oral Daily  . sodium chloride  3 mL Intravenous Q12H  . sodium chloride  3 mL Intravenous Q12H  . thiamine  100 mg Oral Daily  . triamcinolone cream   Topical  BID    Continuous Infusions:   Past Medical History  Diagnosis Date  . HYPERLIPIDEMIA 10/01/2009  . HYPERTENSION 10/01/2009  . CAD 10/01/2009  . PVD 10/01/2009  . ALLERGIC RHINITIS 12/24/2009  . COPD 10/01/2009  . 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 COUGH, CHRONIC 01/28/2010    Past Surgical History  Procedure Laterality Date  . Fracture surgery  2012    ORIF r tibia fracture    Antonieta Iba, RD, LDN Clinical Inpatient Dietitian Pager:  [phone] Weekend and after hours pager:  [phone]

## 2014-08-14 NOTE — Progress Notes (Signed)
[facility] PROGRESS NOTE  Douglas Mann UYQ:[id number] DOB: 10-27-1946 DOA: 08/13/2014 PCP: [name], MD  Assessment/Plan  Chest Pain, occurs primarily after eating, atypical -  troponins neg -  Tele:  NSR  -  Esophagram -  Start bid Protonix -  Start carafate   Lower GI bleed, decreasing blood in stools, last BM yesterday AM, however, hgb decreased this am -  likely hemmorhoidal bleeding by his history  -  After hgb:  Improved -  Repeat hgb in AM  Hyperlipidemia, stable, continue statin  Low normal BP in setting of normal Hypertension despite holding home BP medications -  IVF -  Check orthostatics in AM -  Cortisol level in AM -  Monitor for bleeding  COPD with wheezing but without acute exacerbation -  Continue dulera -  Start scheduled duonebs  Tobacco abuse -  Nicotine patch -  Smoking cessation counseling  Shoulder pain, likely arthritis (bilateral) -  Capsaicin cream  Diet:  Healthy heart Access:  PIV IVF:  yes Proph:  scd  Code Status: full Family Communication: patient alone Disposition Plan: pending further eval of low BP and chest pain   Consultants:  None  Procedures:  CT abd/pelvis  Esophagram  Antibiotics:  none   HPI/Subjective:  Able to eat breakfast without difficulty, but in the afternoon and evening, develops substernal and epigastric pain mostly with solid foods that occurs about 5-10 minutes after eating.  Denies painful swallowing or sensation of food getting stuck.  Feels mildly SOB and wheezy currently.  Last BM yesterday and decreasing blood.  Felt LH with PT today.    Objective: Filed Vitals:   08/14/14 0435 08/14/14 0951 08/14/14 1054 08/14/14 1427  BP: 111/57  100/43 105/51  Pulse: 73  75 64  Temp: 97.7 F (36.5 C)   98.7 F (37.1 C)  TempSrc: Oral   Oral  Resp: 18   19  Height:      Weight:      SpO2: 99% 97%  100%    Intake/Output Summary (Last 24 hours) at 08/14/14 1435 Last data filed at  08/14/14 1427  Gross per 24 hour  Intake 2497.91 ml  Output   2500 ml  Net  -2.09 ml   Filed Weights   08/13/14 2020  Weight: 74.072 kg (163 lb 4.8 oz)    Exam:   General:  WM, No acute distress  HEENT:  NCAT, MMM  Cardiovascular:  RRR, nl S1, S2 no mrg, 2+ pulses, warm extremities  Respiratory:  Diminished bilateral BS with high pitched wheezes throughout, no rales or rhonchi, no increased WOB  Abdomen:   NABS, soft, NT/ND  MSK:   Normal tone and bulk, no LEE  Neuro:  Grossly intact  Data Reviewed: Basic Metabolic Panel:  Recent Labs Lab 08/13/14 1620 08/14/14 0220  NA 138 137  K 4.7 4.2  CL 99 102  CO2 25 24  GLUCOSE 97 111*  BUN 18 12  CREATININE 1.14 1.03  CALCIUM 9.9 8.7   Liver Function Tests:  Recent Labs Lab 08/13/14 1620 08/14/14 0220  AST 17 16  ALT 12 9  ALKPHOS 86 64  BILITOT 0.4 0.3  PROT 8.2 6.1  ALBUMIN 4.1 3.2*    Recent Labs Lab 08/13/14 1620  LIPASE 52   No results found for this basename: AMMONIA,  in the last 168 hours CBC:  Recent Labs Lab 08/13/14 1620 08/14/14 0220 08/14/14 1356  WBC 8.3 8.1  --   NEUTROABS 5.5  --   --  HGB 11.5* 9.3* 9.8*  HCT 35.0* 28.6* 29.8*  MCV 88.8 89.7  --   PLT 185 150  --    Cardiac Enzymes:  Recent Labs Lab 08/13/14 1620 08/13/14 2049 08/14/14 0220  TROPONINI <0.30 <0.30 <0.30   BNP (last 3 results) No results found for this basename: PROBNP,  in the last 8760 hours CBG: No results found for this basename: GLUCAP,  in the last 168 hours  No results found for this or any previous visit (from the past 240 hour(s)).   Studies: Ct Abdomen Pelvis W Contrast  08/13/2014   CLINICAL DATA:  GI bleeding.  EXAM: CT ABDOMEN AND PELVIS WITH CONTRAST  TECHNIQUE: Multidetector CT imaging of the abdomen and pelvis was performed using the standard protocol following bolus administration of intravenous contrast.  CONTRAST:  89mL OMNIPAQUE IOHEXOL 300 MG/ML SOLN, 155mL OMNIPAQUE IOHEXOL  300 MG/ML SOLN  COMPARISON:  None.  FINDINGS: The lung bases demonstrate a focal calcification over the post to medial right duct matter border. There is minimal linear scarring over the right base.  Abdominal images demonstrate several calcified splenic granulomas. There is mild cholelithiasis. The liver, pancreas and adrenal glands are within normal. Kidneys normal in size without hydronephrosis or focal mass. There are a couple small calcifications adjacent the left renal hilum likely vascular. Ureters are normal. The appendix is normal. There is diverticulosis throughout the colon including the cecum. There is calcified plaque involving the abdominal aorta and iliac vessels. There is minimal dilatation of the infrarenal abdominal aorta measuring 2.7 cm in AP diameter.  Pelvic images demonstrate the bladder, prostate and rectum to be within normal. There is no free fluid or inflammatory change noted. Hardware is present over the left proximal femur. There are mild degenerative changes of the spine and hips.  IMPRESSION: No acute findings in the abdomen/pelvis.  Moderate diverticulosis throughout the colon without active inflammation.  Mild cholelithiasis.   Electronically Signed   By: Marin Olp M.D.   On: 08/13/2014 17:52    Scheduled Meds: . antiseptic oral rinse  7 mL Mouth Rinse BID  . atorvastatin  40 mg Oral q1800  . clopidogrel  75 mg Oral Daily  . docusate sodium  100 mg Oral BID  . feeding supplement (ENSURE COMPLETE)  237 mL Oral BID BM  . folic acid  1 mg Oral Daily  . hydrocortisone   Rectal BID  . mometasone-formoterol  2 puff Inhalation BID  . multivitamin with minerals  1 tablet Oral Daily  . sodium chloride  3 mL Intravenous Q12H  . sodium chloride  3 mL Intravenous Q12H  . thiamine  100 mg Oral Daily  . triamcinolone cream   Topical BID   Continuous Infusions:   Principal Problem:   Chest pain Active Problems:   HYPERLIPIDEMIA   HYPERTENSION   COPD   GI  bleed    Time spent: 30 min    Caisley Baxendale, [facility] Pager [phone]. If 7PM-7AM, please contact night-coverage at [email or web address], password [facility] 08/14/2014, 2:35 PM  LOS: 1 day

## 2014-08-15 ENCOUNTER — Encounter (HOSPITAL_COMMUNITY): Payer: Self-pay | Admitting: Cardiology

## 2014-08-15 ENCOUNTER — Other Ambulatory Visit: Payer: Self-pay

## 2014-08-15 ENCOUNTER — Inpatient Hospital Stay (HOSPITAL_COMMUNITY): Payer: Medicare Other

## 2014-08-15 ENCOUNTER — Observation Stay (HOSPITAL_COMMUNITY): Payer: Medicare Other

## 2014-08-15 ENCOUNTER — Encounter (HOSPITAL_COMMUNITY)
Admission: EM | Disposition: A | Discharge status: Home Health | Payer: Self-pay | Source: Home / Self Care | Attending: Internal Medicine

## 2014-08-15 DIAGNOSIS — I517 Cardiomegaly: Secondary | ICD-10-CM

## 2014-08-15 DIAGNOSIS — I5041 Acute combined systolic (congestive) and diastolic (congestive) heart failure: Secondary | ICD-10-CM | POA: Diagnosis not present

## 2014-08-15 DIAGNOSIS — J441 Chronic obstructive pulmonary disease with (acute) exacerbation: Secondary | ICD-10-CM | POA: Diagnosis present

## 2014-08-15 DIAGNOSIS — J810 Acute pulmonary edema: Secondary | ICD-10-CM | POA: Diagnosis present

## 2014-08-15 DIAGNOSIS — J9601 Acute respiratory failure with hypoxia: Secondary | ICD-10-CM | POA: Diagnosis not present

## 2014-08-15 DIAGNOSIS — Z66 Do not resuscitate: Secondary | ICD-10-CM | POA: Diagnosis not present

## 2014-08-15 DIAGNOSIS — I739 Peripheral vascular disease, unspecified: Secondary | ICD-10-CM | POA: Diagnosis present

## 2014-08-15 DIAGNOSIS — I214 Non-ST elevation (NSTEMI) myocardial infarction: Secondary | ICD-10-CM | POA: Diagnosis not present

## 2014-08-15 DIAGNOSIS — I2510 Atherosclerotic heart disease of native coronary artery without angina pectoris: Secondary | ICD-10-CM

## 2014-08-15 DIAGNOSIS — R072 Precordial pain: Secondary | ICD-10-CM

## 2014-08-15 DIAGNOSIS — Z9981 Dependence on supplemental oxygen: Secondary | ICD-10-CM | POA: Diagnosis not present

## 2014-08-15 DIAGNOSIS — R0789 Other chest pain: Secondary | ICD-10-CM

## 2014-08-15 DIAGNOSIS — I701 Atherosclerosis of renal artery: Secondary | ICD-10-CM

## 2014-08-15 DIAGNOSIS — N179 Acute kidney failure, unspecified: Secondary | ICD-10-CM | POA: Diagnosis not present

## 2014-08-15 DIAGNOSIS — E8881 Metabolic syndrome: Secondary | ICD-10-CM | POA: Diagnosis present

## 2014-08-15 DIAGNOSIS — E871 Hypo-osmolality and hyponatremia: Secondary | ICD-10-CM | POA: Diagnosis not present

## 2014-08-15 DIAGNOSIS — Z79899 Other long term (current) drug therapy: Secondary | ICD-10-CM | POA: Diagnosis not present

## 2014-08-15 DIAGNOSIS — I472 Ventricular tachycardia: Secondary | ICD-10-CM | POA: Diagnosis not present

## 2014-08-15 DIAGNOSIS — J9600 Acute respiratory failure, unspecified whether with hypoxia or hypercapnia: Secondary | ICD-10-CM | POA: Diagnosis not present

## 2014-08-15 DIAGNOSIS — R079 Chest pain, unspecified: Secondary | ICD-10-CM | POA: Diagnosis present

## 2014-08-15 DIAGNOSIS — Z951 Presence of aortocoronary bypass graft: Secondary | ICD-10-CM | POA: Diagnosis not present

## 2014-08-15 DIAGNOSIS — E785 Hyperlipidemia, unspecified: Secondary | ICD-10-CM | POA: Diagnosis present

## 2014-08-15 DIAGNOSIS — I4729 Other ventricular tachycardia: Secondary | ICD-10-CM | POA: Diagnosis not present

## 2014-08-15 DIAGNOSIS — R579 Shock, unspecified: Secondary | ICD-10-CM

## 2014-08-15 DIAGNOSIS — F411 Generalized anxiety disorder: Secondary | ICD-10-CM | POA: Diagnosis present

## 2014-08-15 DIAGNOSIS — G9340 Encephalopathy, unspecified: Secondary | ICD-10-CM | POA: Diagnosis not present

## 2014-08-15 DIAGNOSIS — Z7982 Long term (current) use of aspirin: Secondary | ICD-10-CM | POA: Diagnosis not present

## 2014-08-15 DIAGNOSIS — N189 Chronic kidney disease, unspecified: Secondary | ICD-10-CM | POA: Diagnosis present

## 2014-08-15 DIAGNOSIS — R570 Cardiogenic shock: Secondary | ICD-10-CM | POA: Diagnosis not present

## 2014-08-15 DIAGNOSIS — I5043 Acute on chronic combined systolic (congestive) and diastolic (congestive) heart failure: Secondary | ICD-10-CM | POA: Diagnosis present

## 2014-08-15 DIAGNOSIS — E876 Hypokalemia: Secondary | ICD-10-CM | POA: Diagnosis not present

## 2014-08-15 DIAGNOSIS — I129 Hypertensive chronic kidney disease with stage 1 through stage 4 chronic kidney disease, or unspecified chronic kidney disease: Secondary | ICD-10-CM | POA: Diagnosis present

## 2014-08-15 DIAGNOSIS — I2582 Chronic total occlusion of coronary artery: Secondary | ICD-10-CM | POA: Diagnosis present

## 2014-08-15 DIAGNOSIS — K922 Gastrointestinal hemorrhage, unspecified: Secondary | ICD-10-CM | POA: Diagnosis present

## 2014-08-15 DIAGNOSIS — F17200 Nicotine dependence, unspecified, uncomplicated: Secondary | ICD-10-CM | POA: Diagnosis present

## 2014-08-15 HISTORY — PX: LEFT HEART CATH: SHX5478

## 2014-08-15 LAB — CBC WITH DIFFERENTIAL/PLATELET
Basophils Absolute: 0 10*3/uL (ref 0.0–0.1)
Basophils Relative: 0 % (ref 0–1)
Eosinophils Absolute: 0.3 10*3/uL (ref 0.0–0.7)
Eosinophils Relative: 2 % (ref 0–5)
HCT: 33.7 % — ABNORMAL LOW (ref 39.0–52.0)
Hemoglobin: 11.1 g/dL — ABNORMAL LOW (ref 13.0–17.0)
Lymphocytes Relative: 29 % (ref 12–46)
Lymphs Abs: 4.4 10*3/uL — ABNORMAL HIGH (ref 0.7–4.0)
MCH: 29.8 pg (ref 26.0–34.0)
MCHC: 32.9 g/dL (ref 30.0–36.0)
MCV: 90.3 fL (ref 78.0–100.0)
Monocytes Absolute: 1 10*3/uL (ref 0.1–1.0)
Monocytes Relative: 7 % (ref 3–12)
Neutro Abs: 9.3 10*3/uL — ABNORMAL HIGH (ref 1.7–7.7)
Neutrophils Relative %: 62 % (ref 43–77)
Platelets: 223 10*3/uL (ref 150–400)
RBC: 3.73 MIL/uL — ABNORMAL LOW (ref 4.22–5.81)
RDW: 13.4 % (ref 11.5–15.5)
WBC: 15.1 10*3/uL — ABNORMAL HIGH (ref 4.0–10.5)

## 2014-08-15 LAB — CBC
HCT: 29.2 % — ABNORMAL LOW (ref 39.0–52.0)
Hemoglobin: 9.6 g/dL — ABNORMAL LOW (ref 13.0–17.0)
MCH: 30.1 pg (ref 26.0–34.0)
MCHC: 32.9 g/dL (ref 30.0–36.0)
MCV: 91.5 fL (ref 78.0–100.0)
PLATELETS: 148 10*3/uL — AB (ref 150–400)
RBC: 3.19 MIL/uL — ABNORMAL LOW (ref 4.22–5.81)
RDW: 13.4 % (ref 11.5–15.5)
WBC: 11.7 10*3/uL — ABNORMAL HIGH (ref 4.0–10.5)

## 2014-08-15 LAB — COMPREHENSIVE METABOLIC PANEL
ALT: 11 U/L (ref 0–53)
AST: 26 U/L (ref 0–37)
Albumin: 3.5 g/dL (ref 3.5–5.2)
Alkaline Phosphatase: 83 U/L (ref 39–117)
Anion gap: 17 — ABNORMAL HIGH (ref 5–15)
BUN: 10 mg/dL (ref 6–23)
CO2: 21 mEq/L (ref 19–32)
Calcium: 9.1 mg/dL (ref 8.4–10.5)
Chloride: 98 mEq/L (ref 96–112)
Creatinine, Ser: 1.18 mg/dL (ref 0.50–1.35)
GFR calc Af Amer: 70 mL/min — ABNORMAL LOW (ref >90)
GFR calc non Af Amer: 61 mL/min — ABNORMAL LOW (ref >90)
Glucose, Bld: 254 mg/dL — ABNORMAL HIGH (ref 70–99)
Potassium: 4.3 mEq/L (ref 3.7–5.3)
Sodium: 136 mEq/L — ABNORMAL LOW (ref 137–147)
Total Bilirubin: 0.5 mg/dL (ref 0.3–1.2)
Total Protein: 7.5 g/dL (ref 6.0–8.3)

## 2014-08-15 LAB — CORTISOL-AM, BLOOD: Cortisol - AM: 10.2 ug/dL (ref 4.3–22.4)

## 2014-08-15 LAB — BASIC METABOLIC PANEL
Anion gap: 11 (ref 5–15)
BUN: 8 mg/dL (ref 6–23)
CALCIUM: 9 mg/dL (ref 8.4–10.5)
CO2: 24 mEq/L (ref 19–32)
Chloride: 101 mEq/L (ref 96–112)
Creatinine, Ser: 1.03 mg/dL (ref 0.50–1.35)
GFR calc Af Amer: 83 mL/min — ABNORMAL LOW (ref >90)
GFR, EST NON AFRICAN AMERICAN: 72 mL/min — AB (ref >90)
GLUCOSE: 113 mg/dL — AB (ref 70–99)
Potassium: 4.7 mEq/L (ref 3.7–5.3)
SODIUM: 136 meq/L — AB (ref 137–147)

## 2014-08-15 LAB — PHOSPHORUS: Phosphorus: 4.8 mg/dL — ABNORMAL HIGH (ref 2.3–4.6)

## 2014-08-15 LAB — MAGNESIUM: Magnesium: 1.9 mg/dL (ref 1.5–2.5)

## 2014-08-15 LAB — TROPONIN I: Troponin I: 1.62 ng/mL (ref <0.30)

## 2014-08-15 LAB — PRO B NATRIURETIC PEPTIDE: Pro B Natriuretic peptide (BNP): 2319 pg/mL — ABNORMAL HIGH (ref 0–125)

## 2014-08-15 SURGERY — LEFT HEART CATH
Anesthesia: LOCAL | Laterality: Bilateral

## 2014-08-15 MED ORDER — FUROSEMIDE 10 MG/ML IJ SOLN
40.0000 mg | Freq: Once | INTRAMUSCULAR | Status: AC
  Administered 2014-08-15: 40 mg via INTRAVENOUS

## 2014-08-15 MED ORDER — MORPHINE SULFATE 2 MG/ML IJ SOLN
INTRAMUSCULAR | Status: AC
  Administered 2014-08-15: 2 mg via INTRAVENOUS
  Filled 2014-08-15: qty 1

## 2014-08-15 MED ORDER — DEXTROSE 5 % IV SOLN
30.0000 ug/min | INTRAVENOUS | Status: DC
  Filled 2014-08-15: qty 1

## 2014-08-15 MED ORDER — FENTANYL CITRATE 0.05 MG/ML IJ SOLN: 50.0000 ug | Freq: Once | INTRAMUSCULAR | Status: DC

## 2014-08-15 MED ORDER — AMIODARONE HCL IN DEXTROSE 360-4.14 MG/200ML-% IV SOLN
60.0000 mg/h | INTRAVENOUS | Status: AC
  Administered 2014-08-15: 60 mg/h via INTRAVENOUS
  Filled 2014-08-15: qty 200

## 2014-08-15 MED ORDER — SODIUM CHLORIDE 0.9 % IV SOLN
0.0000 ug/h | INTRAVENOUS | Status: DC
  Administered 2014-08-15: 50 ug/h via INTRAVENOUS
  Administered 2014-08-16: 200 ug/h via INTRAVENOUS
  Administered 2014-08-17: 250 ug/h via INTRAVENOUS
  Filled 2014-08-15 (×2): qty 50

## 2014-08-15 MED ORDER — DEXTROSE 5 % IV SOLN
2.0000 ug/min | INTRAVENOUS | Status: DC
  Administered 2014-08-15: 5 ug/min via INTRAVENOUS
  Filled 2014-08-15: qty 4

## 2014-08-15 MED ORDER — MAGNESIUM SULFATE 40 MG/ML IJ SOLN
2.0000 g | Freq: Once | INTRAMUSCULAR | Status: AC
  Administered 2014-08-16: 2 g via INTRAVENOUS
  Filled 2014-08-15: qty 50

## 2014-08-15 MED ORDER — ASPIRIN EC 81 MG PO TBEC
81.0000 mg | DELAYED_RELEASE_TABLET | Freq: Every day | ORAL | Status: DC
Start: 2014-08-15 — End: 2014-08-15

## 2014-08-15 MED ORDER — FUROSEMIDE 10 MG/ML IJ SOLN
INTRAMUSCULAR | Status: AC
  Administered 2014-08-15: 40 mg via INTRAVENOUS
  Filled 2014-08-15: qty 4

## 2014-08-15 MED ORDER — AMIODARONE IV BOLUS ONLY 150 MG/100ML
INTRAVENOUS | Status: AC
  Filled 2014-08-15: qty 100

## 2014-08-15 MED ORDER — LEVALBUTEROL HCL 1.25 MG/0.5ML IN NEBU
1.2500 mg | INHALATION_SOLUTION | Freq: Once | RESPIRATORY_TRACT | Status: AC
  Administered 2014-08-15: 1.25 mg via RESPIRATORY_TRACT
  Filled 2014-08-15: qty 0.5

## 2014-08-15 MED ORDER — PANTOPRAZOLE SODIUM 40 MG IV SOLR
40.0000 mg | Freq: Two times a day (BID) | INTRAVENOUS | Status: DC
  Administered 2014-08-16 – 2014-08-19 (×7): 40 mg via INTRAVENOUS
  Filled 2014-08-15 (×9): qty 40

## 2014-08-15 MED ORDER — DOCUSATE SODIUM 50 MG/5ML PO LIQD
100.0000 mg | Freq: Two times a day (BID) | ORAL | Status: DC | PRN
  Filled 2014-08-15: qty 10

## 2014-08-15 MED ORDER — FENTANYL BOLUS VIA INFUSION
25.0000 ug | INTRAVENOUS | Status: DC | PRN
  Administered 2014-08-16: 50 ug via INTRAVENOUS
  Administered 2014-08-17: 25 ug via INTRAVENOUS
  Filled 2014-08-15: qty 50

## 2014-08-15 MED ORDER — DEXTROSE 5 % IV SOLN
2.0000 ug/min | INTRAVENOUS | Status: DC
  Filled 2014-08-15: qty 4

## 2014-08-15 MED ORDER — NITROGLYCERIN 0.4 MG SL SUBL
SUBLINGUAL_TABLET | SUBLINGUAL | Status: AC
  Administered 2014-08-15 (×3)
  Filled 2014-08-15: qty 1

## 2014-08-15 MED ORDER — FUROSEMIDE 10 MG/ML IJ SOLN
INTRAMUSCULAR | Status: AC
  Filled 2014-08-15: qty 4

## 2014-08-15 MED ORDER — INSULIN ASPART 100 UNIT/ML ~~LOC~~ SOLN
2.0000 [IU] | SUBCUTANEOUS | Status: DC
  Administered 2014-08-16 (×2): 4 [IU] via SUBCUTANEOUS
  Administered 2014-08-16: 6 [IU] via SUBCUTANEOUS
  Administered 2014-08-17: 4 [IU] via SUBCUTANEOUS
  Administered 2014-08-17 (×5): 2 [IU] via SUBCUTANEOUS
  Administered 2014-08-17 – 2014-08-18 (×2): 4 [IU] via SUBCUTANEOUS
  Administered 2014-08-18 (×2): 2 [IU] via SUBCUTANEOUS
  Administered 2014-08-18: 4 [IU] via SUBCUTANEOUS
  Administered 2014-08-18 – 2014-08-19 (×2): 2 [IU] via SUBCUTANEOUS
  Administered 2014-08-19: 4 [IU] via SUBCUTANEOUS
  Administered 2014-08-19: 2 [IU] via SUBCUTANEOUS

## 2014-08-15 MED ORDER — BISACODYL 5 MG PO TBEC: 5.0000 mg | DELAYED_RELEASE_TABLET | Freq: Every day | ORAL | Status: DC | PRN

## 2014-08-15 MED ORDER — ASPIRIN 81 MG PO CHEW
324.0000 mg | CHEWABLE_TABLET | Freq: Once | ORAL | Status: AC
  Administered 2014-08-15: 324 mg via ORAL
  Filled 2014-08-15: qty 4

## 2014-08-15 MED ORDER — BISACODYL 10 MG RE SUPP
10.0000 mg | Freq: Every day | RECTAL | Status: DC | PRN
  Administered 2014-08-17: 10 mg via RECTAL
  Filled 2014-08-15: qty 1

## 2014-08-15 MED ORDER — IPRATROPIUM-ALBUTEROL 0.5-2.5 (3) MG/3ML IN SOLN
3.0000 mL | RESPIRATORY_TRACT | Status: DC
  Administered 2014-08-16 – 2014-08-18 (×18): 3 mL via RESPIRATORY_TRACT
  Filled 2014-08-15 (×18): qty 3

## 2014-08-15 MED ORDER — FENTANYL CITRATE 0.05 MG/ML IJ SOLN
INTRAMUSCULAR | Status: AC
  Filled 2014-08-15: qty 2

## 2014-08-15 MED ORDER — AMIODARONE HCL IN DEXTROSE 360-4.14 MG/200ML-% IV SOLN
30.0000 mg/h | INTRAVENOUS | Status: DC
  Administered 2014-08-16 – 2014-08-21 (×11): 30 mg/h via INTRAVENOUS
  Filled 2014-08-15 (×24): qty 200

## 2014-08-15 MED ORDER — FENTANYL CITRATE 0.05 MG/ML IJ SOLN
INTRAMUSCULAR | Status: AC
  Administered 2014-08-15: 100 ug
  Filled 2014-08-15: qty 2

## 2014-08-15 MED FILL — Medication: Qty: 1 | Status: AC

## 2014-08-15 NOTE — Progress Notes (Signed)
Called for RRT to room 1405. Nurse at bedside with pt. Pt alert oriented states CP of 8 on scale none radiating. EKG done Resp giving neb treatment. NTG and ASA given. Sat had improved from 77 on [place] to 95 on NRB after treatment took approx 3 to 5 minutes. Contacted [facility]ist orders for 40 of Lasix then repeated. Meds given no change in pain levels however breathing "some easier" per patient. Morphine given and pain decreased to 4 for short period of time. NP talking with Cardiology when pt went into [facility] lasted approx 2 mins. Placed pt on code cart monitor and called code. ED at bedside pt converted to sinus tach rates 130's conscious and alert. Doctors assessing and pt back into VTach orders for amiodarone ordered and given 150mg . Pt converted to sinus tach. Discission made to move to ICU while deciding if transport to [facility] cath lab. Moved pt and on arrival, ED doctor decided to intubate due to the small movement of air in lungs. Contacted [facility] for vent orders. Cardiology at bedside. Decision made to transfer pt to [facility]. First placed Central line and OG tube and Foley cath. [facility] at bedside. Report given. Assisted with loading pt for transport. BP dropped after intubation and meds, Levophed started then off after BP improved, on short time. Sent with Fentanyl gtt not started with [facility].

## 2014-08-15 NOTE — Procedures (Signed)
Staff note   - supervised procedure at bedside. Real time 2D ultrasound used for vein site selection, patency assessment, and needle entry. / A record of image was made but could not be submitted for filing due to malfunction of printing device   Dr. Brand Males, M.D., [facility].C.P Pulmonary and Critical Care Medicine Staff Physician [place] [facility] Pager: [phone], If no answer or between  15:00h - 7:00h: call [phone]  08/15/2014 10:07 PM

## 2014-08-15 NOTE — ED Provider Notes (Signed)
Called to floor in response to "Code Blue." Not familiar with pt, but clearly in distress.  Apparently admitted with GI bleed but had been havng CP. Before I arrived apparently he he developed fairly sudden diaphoresis, tachypnea, hypoxia and chest pain. I was told CXR showed pulmonary edema and given lasix. Shortly later developed VTach.   When I arrived pt appeared ill, pale, eyes closed but would respond verbally to some basic questions, increased WOB on NRB. Endorsed CP and felt "not good." Monitor with regular wide complex tachycardia @~180 with different morphology than recent EKG I was handed. BP ok. Amiodarone 150 ordered. Converted to sinus tach shortly later. Called by lab just after this and troponin 1.6. More than likely dysrhythmia precipitated by MI. WOB remained significantly increased and again developing hypoxemia despite NRB. Made decision to intubate. Cardiology at bedside. Pt moved to ICU and intubated. Further care per CCM/cardiology.    INTUBATION Performed by: Virgel Manifold  Required items: required blood products, implants, devices, and special equipment available Patient identity confirmed: provided demographic data and hospital-assigned identification number Time out: Immediately prior to procedure a "time out" was called to verify the correct patient, procedure, equipment, support staff and site/side marked as required.  Indications: Respiratory failure/Airway protection  Intubation method: Glidescope Laryngoscopy   Preoxygenation: NRB/BVM  Sedatives: Etomidate Paralytic: Rocuronium  Tube Size: 8.0 cuffed  Post-procedure assessment: chest rise and ETCO2 monitor Breath sounds: equal and absent over the epigastrium Tube secured with: ETT holder. 22 at lips.   Verbally asked for PCXR and OGT and meds for continued sedation.  Informed by ICU staff that [facility] could manage these though. I was clinically satisfied with ETT position at this point and returned to ED.    Patient tolerated the procedure well with no immediate complications.     Virgel Manifold, MD 08/16/14 [phone]

## 2014-08-15 NOTE — Consult Note (Addendum)
PULMONARY / CRITICAL CARE MEDICINE   Name: Douglas Mann MRN: [id number] DOB: 04/01/1946    ADMISSION DATE:  08/13/2014 CONSULTATION DATE:  08/15/2014   REFERRING MD :   [facility]  CHIEF COMPLAINT:  NSTEMI - > V tach with BP -> Acute pulm edema -> VDRF -> shock  INITIAL PRESENTATION: see hpi   EVENTS  08/13/2014 - admit 8/27/1 5 - intubated and to cath lab   HISTORY OF PRESENT ILLNESS:  68 year old male75 pack active smoker,  full code with known CAD and s/p stent at [place] > 10 years ago and copd with o2 dependency Admitted 08/13/14 25 with chest a chief complaint of bleeding in his bowels and a swollen abdomen along with a story that sounded like mesentric angina (post prandial abd discmfrt and weight  Loss and ability to handle only small amounts of food at at time). At admission he ruled out for MI. Then on evening of 08/15/14 he develped sudden onset chest pain, with sinus tachycardia with EKG change to duffise ST depression in inferior and lateral leads and respiratory distress with CXR suggestive of flash pulmonary edema. Despite lasix he developed 2 x episodes of V Tach with normal BP and needing IV amio (never needed cpr). Patient emergently intubated on [place] floor and moved to ICU. Post arrival in ICU he was in shock and levophed started. PCCM asked to admit. PAtient moving to [facility] cath lab   Of note, there was some question of GIB due to jhgb 9/3gm% at admission but 08/15/14 the hgb was > 11gm% and had improved   PAST MEDICAL HISTORY :  Past Medical History  Diagnosis Date  . HYPERLIPIDEMIA 10/01/2009  . HYPERTENSION 10/01/2009  . CAD 10/01/2009    Stent at [facility] greater than 10 years ago  . PVD 10/01/2009  . ALLERGIC RHINITIS 12/24/2009  . COPD 10/01/2009  . OSTEOARTHRITIS, GENERALIZED, MULTIPLE JOINTS 12/24/2009   Past Surgical History  Procedure Laterality Date  . Fracture surgery  2012    ORIF r tibia fracture  . Tonsillectomy and adenoidectomy     Prior to Admission  medications   Medication Sig Start Date End Date Taking? Authorizing Provider  albuterol (VENTOLIN HFA) 108 (90 BASE) MCG/ACT inhaler Inhale 2 puffs into the lungs every 6 (six) hours as needed. 09/17/11  Yes [name], MD  aspirin 81 MG tablet Take 81 mg by mouth daily.     Yes Historical Provider, MD  atenolol (TENORMIN) 50 MG tablet take 1 tablet by mouth twice a day 12/24/13  Yes [name], MD  clopidogrel (PLAVIX) 75 MG tablet take 1 tablet by mouth once daily 12/24/13  Yes [name], MD  fish oil-omega-3 fatty acids 1000 MG capsule Take 2 g by mouth daily.     Yes Historical Provider, MD  Fluticasone-Salmeterol (ADVAIR) 500-50 MCG/DOSE AEPB Inhale 1 puff into the lungs 2 (two) times daily.   Yes Historical Provider, MD  lisinopril (PRINIVIL,ZESTRIL) 20 MG tablet take 1 tablet by mouth once daily 12/24/13  Yes [name], MD  NON FORMULARY O2 per nasal canula continuous @@ 2 liters   Yes Historical Provider, MD  prazosin (MINIPRESS) 1 MG capsule take 1 capsule by mouth twice a day 12/24/13  Yes [name], MD  PROCTOSOL HC 2.5 % rectal cream apply rectally twice a day 03/27/12  Yes [name], MD  simvastatin (ZOCOR) 80 MG tablet take one half tablet daily 06/20/14  Yes Alinda Sierras  Burchette, MD  triamcinolone cream (KENALOG) 0.1 % Apply topically 2 (two) times daily. 08/07/14  Yes [name], MD   No Known Allergies  FAMILY HISTORY:  Family History  Problem Relation Age of Onset  . CAD Father     Died age 46 MI   SOCIAL HISTORY:  reports that he has been smoking Cigarettes.  He has a 75 pack-year smoking history. He has never used smokeless tobacco. He reports that he does not drink alcohol or use illicit drugs.  REVIEW OF SYSTEMS:  unelicitable due to intubated status  SUBJECTIVE:   VITAL SIGNS: Temp:  [98.1 F (36.7 C)-98.8 F (37.1 C)] 98.8 F (37.1 C) (08/27 1611) Pulse Rate:  [71-116] 111 (08/27 2120) Resp:  [13-20] 20 (08/27 2120) BP:  (56-152)/(37-96) 78/50 mmHg (08/27 2120) SpO2:  [80 %-99 %] 99 % (08/27 2120) FiO2 (%):  [100 %] 100 % (08/27 2109) HEMODYNAMICS:   VENTILATOR SETTINGS: Vent Mode:  [-] PRVC FiO2 (%):  [100 %] 100 % Set Rate:  [20 bmp] 20 bmp Vt Set:  [540 mL] 540 mL PEEP:  [5 cmH20] 5 cmH20 Plateau Pressure:  [27 cmH20] 27 cmH20 INTAKE / OUTPUT:  Intake/Output Summary (Last 24 hours) at 08/15/14 2129 Last data filed at 08/15/14 1655  Gross per 24 hour  Intake   2610 ml  Output   2400 ml  Net    210 ml    PHYSICAL EXAMINATION: General:  Critically ill looking, intubated, sedated post itnubation Neuro:  RASS -3 with effects of RSI intubation HEENT:  intubated Cardiovascular:  Hypotensive, Sinus  Lungs:  Crackles, sync with vent Abdomen:  Soft, normal bowel sounds Musculoskeletal:  No cyanosis, noclubbing, no edema Skin:  pale  LABS:  PULMONARY No results found for this basename: PHART, PCO2, PCO2ART, PO2, PO2ART, HCO3, TCO2, O2SAT,  in the last 168 hours  CBC  Recent Labs Lab 08/14/14 0220 08/14/14 1356 08/15/14 0400 08/15/14 1957  HGB 9.3* 9.8* 9.6* 11.1*  HCT 28.6* 29.8* 29.2* 33.7*  WBC 8.1  --  11.7* 15.1*  PLT 150  --  148* 223    COAGULATION  Recent Labs Lab 08/13/14 1620  INR 0.97    CARDIAC   Recent Labs Lab 08/13/14 1620 08/13/14 2049 08/14/14 0220 08/15/14 1957  TROPONINI <0.30 <0.30 <0.30 1.62*    Recent Labs Lab 08/15/14 1956  PROBNP 2319.0*     CHEMISTRY  Recent Labs Lab 08/13/14 1620 08/14/14 0220 08/15/14 0400 08/15/14 1957  NA 138 137 136* 136*  K 4.7 4.2 4.7 4.3  CL 99 102 101 98  CO2 25 24 24 21   GLUCOSE 97 111* 113* 254*  BUN 18 12 8 10   CREATININE 1.14 1.03 1.03 1.18  CALCIUM 9.9 8.7 9.0 9.1   Estimated Creatinine Clearance: 54.5 ml/min (by C-G formula based on Cr of 1.18).   LIVER  Recent Labs Lab 08/13/14 1620 08/14/14 0220 08/15/14 1957  AST 17 16 26   ALT 12 9 11   ALKPHOS 86 64 83  BILITOT 0.4 0.3 0.5   PROT 8.2 6.1 7.5  ALBUMIN 4.1 3.2* 3.5  INR 0.97  --   --      INFECTIOUS  Recent Labs Lab 08/13/14 1650  LATICACIDVEN 0.91     ENDOCRINE CBG (last 3)  No results found for this basename: GLUCAP,  in the last 72 hours       IMAGING x48h Dg Chest 1 View  08/15/2014   CLINICAL DATA:  rapid response, sob,  tachycardia  EXAM: CHEST - 1 VIEW  COMPARISON:  08/15/2014 at 0515 hr  FINDINGS: Interval development of moderate pulmonary edema with perihilar and basilar alveolar edema. Cardiopericardial silhouette partially obscured but grossly unchanged compared to prior. No pleural effusion. No pneumothorax. Old RIGHT distal clavicle fracture.  IMPRESSION: Interval development of moderate bilateral airspace disease consistent with pulmonary edema.   Electronically Signed   By: Dereck Ligas M.D.   On: 08/15/2014 19:58   Dg Chest Port 1 View  08/15/2014   CLINICAL DATA:  Chest pain  EXAM: PORTABLE CHEST - 1 VIEW  COMPARISON:  09/06/2011  FINDINGS: Small stable granuloma in the right upper lobe near the minor fissure. There is minor lung base linear/reticular opacity that is most likely atelectasis. Lungs are otherwise clear. No pleural effusion or pneumothorax.  Normal heart, mediastinum and hila.  Bony thorax is demineralized but grossly intact.  IMPRESSION: No active disease.   Electronically Signed   By: Lajean Manes M.D.   On: 08/15/2014 07:46       ASSESSMENT / PLAN:  PULMONARY OETT 08/15/14 >> A: Acute Respiratory Failure due to Acute Pulmonary Edema - intubated 08/15/14 emergently P:   Full vent support Lasix if bo will tolerate Allow for peep and ionotripy Nebs  CARDIOVASCULAR CVL 08/15/14 Left IJ A: Hx of CAD. New NSTEMI with V tach and then shock 08/15/14  P:  Start levophed Stat cath lab Mgmt per cards Aspirin and plavix per cards Holding other anticoagulants by cards due to hx of  Possible but never confirmed GI bleed this admit   RENAL A:  intact P:    Monitor Check lytes and lactate  GASTROINTESTINAL A:  Hx of chronic intermittent abd pain with weight loss - sounds like mesentric angina Possible GI bleed at admit - no one has witnessed but stool occult blood positive post admit  P:   SUP ? Mesentric angiogram in cath lab  HEMATOLOGIC A:  Anemia at admission - unclear cause P:  PRBC goal  hgb > 8gm% in setting of MI  INFECTIOUS A:  No evidence of infection P:   BCx2 - none UC none  Sputum- none Abx: none, start date*x day x/x  ENDOCRINE A:  Metabolic Syndrome without hx of DM   P:   ICU hyperglycemia part 1  NEUROLOGIC A:  RAS -3 post intubation meds P:   RASS goal: 0 to -2 Fent gtt  TODAY'S SUMMARY: no family at bedside but Dr Radford Pax from cards has updated them. Move to [facility] PCCM primar but if post cath lab primary cardiac etiology found and patient extubated, card will be primary     The patient is critically ill with multiple organ systems failure and requires high complexity decision making for assessment and support, frequent evaluation and titration of therapies, application of advanced monitoring technologies and extensive interpretation of multiple databases.   Critical Care Time devoted to patient care services described in this note is  60  Minutes.  Dr. Brand Males, M.D., [facility].C.P Pulmonary and Critical Care Medicine Staff Physician [place] [facility] Pager: [phone], If no answer or between  15:00h - 7:00h: call [phone]  08/15/2014 9:52 PM

## 2014-08-15 NOTE — Progress Notes (Signed)
UR completed 

## 2014-08-15 NOTE — Progress Notes (Addendum)
[facility] PROGRESS NOTE  Douglas Mann PNT:[id number] DOB: [birth year] DOA: 08/13/2014 PCP: [name], MD  Assessment/Plan  Chest Pain, occurs primarily after eating but at other times too.  Has history of 4 previous MIs, but has not seen a cardiologist in years.  Would be helpful to rule out ischemia as cause of symptoms so we could fully focus on other causes.   -  troponins neg -  Tele:  NSR  -  Esophagram:  Patient refused -  Continue bid Protonix -  Patient refusing carafate because he states it makes his pain worse -  Change to CLD, then NPO at [facility] -  Cardiology consult for possible stress test -  GI consult for possible EGD to eval for stricture, esophagitis, or other structural abnl.  Reached Paula  Lower GI bleed, decreasing blood in stools, last BM yesterday AM, however, hgb stable -  likely hemorrhoidal bleeding by his history   CAD with intermittent chest pressure.   -  ASA on hold due to possible GIB -  Holding BB and ACEI due to low BP -  Continue statin  Hyperlipidemia, stable, continue statin  Low normal BP in setting of normal Hypertension despite holding home BP medications -  Cortisol level 10.2 wnl. -  No further bleeding  COPD with wheezing but without acute exacerbation -  Continue dulera -  continue duonebs  Tobacco abuse -  Nicotine patch -  Smoking cessation counseling  Shoulder pain, likely arthritis (bilateral) -  Capsaicin cream:  Patient refuses to even try the cream  Diet:  CLD Access:  PIV IVF:  off Proph:  scd  Code Status: full Family Communication: patient alone Disposition Plan: pending further eval of low BP and chest pain   Consultants:  None  Procedures:  CT abd/pelvis  Esophagram  Antibiotics:  none   HPI/Subjective:  Still having intermittent substernal chest pressure which he attributes to swallowing/eating.  Has abdominal fullness and pressure. Has early satiety.    Objective: Filed Vitals:   08/15/14 0523 08/15/14 0734 08/15/14 0736 08/15/14 1300  BP: 120/58 119/56  130/67  Pulse: 84 71  85  Temp: 98.7 F (37.1 C)   98.1 F (36.7 C)  TempSrc: Oral   Oral  Resp: 20 19  19   Height:      Weight:      SpO2: 94%  99% 98%    Intake/Output Summary (Last 24 hours) at 08/15/14 1526 Last data filed at 08/15/14 1500  Gross per 24 hour  Intake   2970 ml  Output   2500 ml  Net    470 ml   Filed Weights   08/13/14 2020  Weight: 74.072 kg (163 lb 4.8 oz)    Exam:   General:  WM, No acute distress  HEENT:  NCAT, MMM  Cardiovascular:  RRR, nl S1, S2 no mrg, 2+ pulses, warm extremities  Respiratory:  Diminished bilateral BS anteriorly (getting ECHO at time of exam), but no wheezes, no rales or rhonchi, no increased WOB  Abdomen:   NABS, soft, NT/ND  MSK:   Normal tone and bulk, no LEE  Neuro:  Grossly intact  Data Reviewed: Basic Metabolic Panel:  Recent Labs Lab 08/13/14 1620 08/14/14 0220 08/15/14 0400  NA 138 137 136*  K 4.7 4.2 4.7  CL 99 102 101  CO2 25 24 24   GLUCOSE 97 111* 113*  BUN 18 12 8   CREATININE 1.14 1.03 1.03  CALCIUM 9.9 8.7 9.0  Liver Function Tests:  Recent Labs Lab 08/13/14 1620 08/14/14 0220  AST 17 16  ALT 12 9  ALKPHOS 86 64  BILITOT 0.4 0.3  PROT 8.2 6.1  ALBUMIN 4.1 3.2*    Recent Labs Lab 08/13/14 1620  LIPASE 52   No results found for this basename: AMMONIA,  in the last 168 hours CBC:  Recent Labs Lab 08/13/14 1620 08/14/14 0220 08/14/14 1356 08/15/14 0400  WBC 8.3 8.1  --  11.7*  NEUTROABS 5.5  --   --   --   HGB 11.5* 9.3* 9.8* 9.6*  HCT 35.0* 28.6* 29.8* 29.2*  MCV 88.8 89.7  --  91.5  PLT 185 150  --  148*   Cardiac Enzymes:  Recent Labs Lab 08/13/14 1620 08/13/14 2049 08/14/14 0220  TROPONINI <0.30 <0.30 <0.30   BNP (last 3 results) No results found for this basename: PROBNP,  in the last 8760 hours CBG: No results found for this basename: GLUCAP,  in the last 168 hours  No results  found for this or any previous visit (from the past 240 hour(s)).   Studies: Ct Abdomen Pelvis W Contrast  08/13/2014   CLINICAL DATA:  GI bleeding.  EXAM: CT ABDOMEN AND PELVIS WITH CONTRAST  TECHNIQUE: Multidetector CT imaging of the abdomen and pelvis was performed using the standard protocol following bolus administration of intravenous contrast.  CONTRAST:  58mL OMNIPAQUE IOHEXOL 300 MG/ML SOLN, 188mL OMNIPAQUE IOHEXOL 300 MG/ML SOLN  COMPARISON:  None.  FINDINGS: The lung bases demonstrate a focal calcification over the post to medial right duct matter border. There is minimal linear scarring over the right base.  Abdominal images demonstrate several calcified splenic granulomas. There is mild cholelithiasis. The liver, pancreas and adrenal glands are within normal. Kidneys normal in size without hydronephrosis or focal mass. There are a couple small calcifications adjacent the left renal hilum likely vascular. Ureters are normal. The appendix is normal. There is diverticulosis throughout the colon including the cecum. There is calcified plaque involving the abdominal aorta and iliac vessels. There is minimal dilatation of the infrarenal abdominal aorta measuring 2.7 cm in AP diameter.  Pelvic images demonstrate the bladder, prostate and rectum to be within normal. There is no free fluid or inflammatory change noted. Hardware is present over the left proximal femur. There are mild degenerative changes of the spine and hips.  IMPRESSION: No acute findings in the abdomen/pelvis.  Moderate diverticulosis throughout the colon without active inflammation.  Mild cholelithiasis.   Electronically Signed   By: Marin Olp M.D.   On: 08/13/2014 17:52   Dg Chest Port 1 View  08/15/2014   CLINICAL DATA:  Chest pain  EXAM: PORTABLE CHEST - 1 VIEW  COMPARISON:  09/06/2011  FINDINGS: Small stable granuloma in the right upper lobe near the minor fissure. There is minor lung base linear/reticular opacity that is most  likely atelectasis. Lungs are otherwise clear. No pleural effusion or pneumothorax.  Normal heart, mediastinum and hila.  Bony thorax is demineralized but grossly intact.  IMPRESSION: No active disease.   Electronically Signed   By: Lajean Manes M.D.   On: 08/15/2014 07:46    Scheduled Meds: . antiseptic oral rinse  7 mL Mouth Rinse BID  . atorvastatin  40 mg Oral q1800  . capsaicin   Topical BID  . clopidogrel  75 mg Oral Daily  . docusate sodium  100 mg Oral BID  . feeding supplement (ENSURE COMPLETE)  237 mL Oral BID  BM  . folic acid  1 mg Oral Daily  . hydrocortisone   Rectal BID  . ipratropium-albuterol  3 mL Nebulization TID  . mometasone-formoterol  2 puff Inhalation BID  . multivitamin with minerals  1 tablet Oral Daily  . nicotine  21 mg Transdermal Daily  . pantoprazole  40 mg Oral BID AC  . sodium chloride  3 mL Intravenous Q12H  . sodium chloride  3 mL Intravenous Q12H  . sucralfate  1 g Oral TID WC & HS  . thiamine  100 mg Oral Daily  . triamcinolone cream   Topical BID   Continuous Infusions: . sodium chloride 100 mL/hr at 08/15/14 1120    Principal Problem:   Chest pain Active Problems:   HYPERLIPIDEMIA   HYPERTENSION   COPD   GI bleed    Time spent: 30 min    Lewayne Pauley, [place] [facility] Pager [phone]. If 7PM-7AM, please contact night-coverage at [email or web address], password [facility] 08/15/2014, 3:26 PM  LOS: 2 days

## 2014-08-15 NOTE — H&P (Signed)
CARDIOLOGY ADMISSION NOTE  Patient ID: Douglas Mann MRN: [id number] DOB/AGE: 1946/11/13 68 y.o.  Admit date: 08/13/2014 Primary Physician   [name], MD Primary Cardiologist   None Chief Complaint    Chest pain  HPI:  The patient presented on 8/25 with chest a chief complaint of bleeding in his bowels and a swollen abdomen.  He also described epigastric discomfort.  He says that this has been going on for two years off and on.  He has had severe discomfort anytime he tries to eat anything.  He has had abdominal bloating.  the pain is like "something will bust out of my chest."  There is no radiation.  There is no nausea or vomiting.  He has does pass gas with this.  He is very limited by severe COPD and wears oxygen 24/7.  He walks only from room to room.  The does not describe difficulty swallowing but can only handle eating small quantites of food at a time and then he has extreme pain.  He was sent for a barium swallow which he refused because he says that he would not be able to keep it down.  He does have blood in his stools but says that this has been ongoing.  He does have a past history of stenting at [facility] a decade ago but does not see a cardiologist.  He has lost weight in the past but gained some back and he doesn't know how.  He can eat Butter Fingers minis and he does drink Ensure     Past Medical History  Diagnosis Date  . HYPERLIPIDEMIA 10/01/2009  . HYPERTENSION 10/01/2009  . CAD 10/01/2009  . PVD 10/01/2009  . ALLERGIC RHINITIS 12/24/2009  . COPD 10/01/2009  . 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 COUGH, CHRONIC 01/28/2010    Past Surgical History  Procedure Laterality Date  . Fracture surgery  2012    ORIF r tibia fracture    No Known Allergies No current facility-administered medications on file prior to encounter.   Current Outpatient Prescriptions on File Prior to Encounter  Medication Sig Dispense Refill  . albuterol (VENTOLIN HFA)  108 (90 BASE) MCG/ACT inhaler Inhale 2 puffs into the lungs every 6 (six) hours as needed.  3 Inhaler  3  . aspirin 81 MG tablet Take 81 mg by mouth daily.        Marland Kitchen atenolol (TENORMIN) 50 MG tablet take 1 tablet by mouth twice a day  180 tablet  2  . clopidogrel (PLAVIX) 75 MG tablet take 1 tablet by mouth once daily  90 tablet  2  . fish oil-omega-3 fatty acids 1000 MG capsule Take 2 g by mouth daily.        Marland Kitchen lisinopril (PRINIVIL,ZESTRIL) 20 MG tablet take 1 tablet by mouth once daily  90 tablet  2  . NON FORMULARY O2 per nasal canula continuous @@ 2 liters      . prazosin (MINIPRESS) 1 MG capsule take 1 capsule by mouth twice a day  180 capsule  2  . PROCTOSOL HC 2.5 % rectal cream apply rectally twice a day  28.35 g  0  . simvastatin (ZOCOR) 80 MG tablet take one half tablet daily      . triamcinolone cream (KENALOG) 0.1 % Apply topically 2 (two) times daily.  30 g  5   History   Social History  . Marital Status: Divorced    Spouse Name: N/A    Number of  Children: N/A  . Years of Education: N/A   Occupational History  . Not on file.   Social History Main Topics  . Smoking status: Current Every Day Smoker -- 1.00 packs/day for 1 years    Types: Cigarettes  . Smokeless tobacco: Never Used  . Alcohol Use: No  . Drug Use: No  . Sexual Activity: Not on file   Other Topics Concern  . Not on file   Social History Narrative  . No narrative on file    Family History  Problem Relation Age of Onset  . Heart disease Other      ROS:  As stated in the HPI and negative for all other systems.   Physical Exam: Blood pressure 144/59, pulse 78, temperature 98.8 F (37.1 C), temperature source Oral, resp. rate 19, height 5\' 7"  (1.702 m), weight 163 lb 4.8 oz (74.072 kg), SpO2 99.00%.  GENERAL:  Well appearing HEENT:  Pupils equal round and reactive, fundi not visualized, oral mucosa unremarkable NECK:  No jugular venous distention, waveform within normal limits, carotid upstroke  brisk and symmetric, bilateral bruits, no thyromegaly LYMPHATICS:  No cervical, inguinal adenopathy LUNGS:  Clear to auscultation bilaterally BACK:  No CVA tenderness CHEST:  Unremarkable HEART:  PMI not displaced or sustained,S1 and S2 within normal limits, no S3, no S4, no clicks, no rubs, no murmurs ABD:  Flat, positive bowel sounds normal in frequency in pitch, no bruits, no rebound, no guarding, no midline pulsatile mass, no hepatomegaly, no splenomegaly EXT:  2 plus pulses upper and absent DP/PT bilateral lower extremity, no edema, no cyanosis no clubbing SKIN:  No rashes no nodules NEURO:  Cranial nerves II through XII grossly intact, motor grossly intact throughout PSYCH:  Cognitively intact, oriented to person place and time   Labs: Lab Results  Component Value Date   BUN 8 08/15/2014   Lab Results  Component Value Date   CREATININE 1.03 08/15/2014   Lab Results  Component Value Date   NA 136* 08/15/2014   K 4.7 08/15/2014   CL 101 08/15/2014   CO2 24 08/15/2014   Lab Results  Component Value Date   TROPONINI <0.30 08/14/2014   Lab Results  Component Value Date   WBC 11.7* 08/15/2014   HGB 9.6* 08/15/2014   HCT 29.2* 08/15/2014   MCV 91.5 08/15/2014   PLT 148* 08/15/2014    Lab Results  Component Value Date   ALT 9 08/14/2014   AST 16 08/14/2014   ALKPHOS 64 08/14/2014   BILITOT 0.3 08/14/2014      Radiology:  CXR:  Small stable granuloma in the right upper lobe near the minor  fissure. There is minor lung base linear/reticular opacity that is  most likely atelectasis. Lungs are otherwise clear. No pleural  effusion or pneumothorax.  Normal heart, mediastinum and hila.  Bony thorax is demineralized but grossly intact.   EKG:  Rate 84, axis WNL, intervals WNL, no acute ST T wave changes.  08/15/2014  ASSESSMENT AND PLAN:    CHEST PAIN:  This pain is not anginal.  While he does have vascular disease and might even have an ischemic Lexiscan Myoview if we were to  order it this would be very unlikely, from the story and presentation, to be related to the presenting symptoms.  I would not suggest an ischemic work up.  I would suggest GI consultation.  He is on an excellent medical regimen.   BRUITS:  He should have outpatient carotid Dopplers .  HTN:  The blood pressure is at target. No change in medications is indicated. We will continue with therapeutic lifestyle changes (TLC).  ANEMIA:  Lower GI bleed.  Further work up per GI.    SignedMinus Breeding 08/15/2014, 4:57 PM

## 2014-08-15 NOTE — Progress Notes (Signed)
CTSP secondary to acute respiratory distress.  He developed sudden diaphoresis, tachypnea, hypoxia and chest pain.  A chest xray showed acute pulmonary edema. He was given Lasix 40mg  IV and this was repeated.  He was given SL NTG x 3 with no improvement in chest pain.  CP was 10/10 initially.  He was given morphine and breathing and CP improved.  He was wheezing and was given Xopenex.  He then went into [facility] which broke after about 2 minutes.  CPR was never started and patient never lost consciousness.  He then had another episode of VTACH and was given IV Amio which terminated it.  He was transferred to ICU.  BNP came back at 2319 and troponin was 1.62.  EKG showed sinus tachycardia at 122bpm with diffuse ST depression in the inferior and lateral leads.  He was intubated.  Shortly after intubation his SBP dropped to 1mmHg.  He was started on Levophed.  Discussed with Dr. Ellyn Hack who is on call for Interventional Cardiology. Given chest pain with flash pulmonary edema, VTACH and now what appears to be cardiogenic shock, will take emergently to the cath lab.  Of note, the patient was admitted with question of GI bleed with Hgb as low as 9.3 but today back up to 11.3.  Dr. Ellyn Hack is aware.

## 2014-08-15 NOTE — Progress Notes (Signed)
Writer was getting report on pt and entered pt's room to find pt diaphoretic and SOB. Pt stated he could not catch his breath. Crackles and wheezing heard in lungs.O2 sats 77%, HR 140's at this time. On Call NP notified. Respiratory called for a prn breathing treatment.  Pt continued to have difficulty breathing. Rapid Response Nurse, Becky called to assess pt. EKG performed. 1930-Pt c/o chest pain at this time. Nitro given per Rapid Response protocol. On call NP K. Schorr gave orders to give 40 mb IV Lasix. Chest x-ray ordered stat. Transfer orders for StepDown pending. Pt's daughter, Vanita Ingles, updated on status of pt. Writer will update daughter with room once pt is transferred. Carnella Gu[place] I

## 2014-08-15 NOTE — Progress Notes (Signed)
*  PRELIMINARY RESULTS* Echocardiogram 2D Echocardiogram has been performed.  Leavy Cella 08/15/2014, 3:30 PM

## 2014-08-15 NOTE — Care Management Note (Addendum)
Page 1 of 2   09/02/2014     5:02:52 PM CARE MANAGEMENT NOTE 09/02/2014  Patient:  Douglas Mann, Douglas Mann   Account Number:  [id number]  Date Initiated:  08/14/2014  Documentation initiated by:  Leafy Kindle  Subjective/Objective Assessment:   68 yo pt admitted with chest pain and rectal bleed.     Action/Plan:   From home alone.  Has home O2 from [facility]   Anticipated DC Date:  09/02/2014   Anticipated DC Plan:  [place]  In-house referral  Clinical Social Worker  Hospice / [place]  CM consult      [facility] Choice  HOME HEALTH   Choice offered to / List presented to:  C-4 Adult Children   DME arranged  NEBULIZER MACHINE      DME agency  [facility] arranged  HH-1 RN  [place]      [place].   Status of service:  Completed, signed off Medicare Important Message given?  YES (If response is "NO", the following Medicare IM given date fields will be blank) Date Medicare IM given:  08/26/2014 Medicare IM given by:  Elissa Hefty Date Additional Medicare IM given:  09/02/2014 Additional Medicare IM given by:  Ellan Lambert  Discharge Disposition:  [place]  Per UR Regulation:  Reviewed for med. necessity/level of care/duration of stay  If discussed at [place] of Stay Meetings, dates discussed:   08/20/2014  08/22/2014  08/27/2014  08/29/2014    Comments:  09/02/14 Ellan Lambert, RN, BSN [phone] Pt for dc home today with daughters and friend to assist. Pt to follow up at [facility] at dc.Marland KitchenMarland KitchenSpoke with Dr B, as pt has orders in for [facility] CHF follow up, and will be following with [facility].  Dr B states pt will need to follow with [facility], as they are part of the [facility]. Physician called and spoke with daughter, and she is agreeable with change in [facility] agency.  Notified Edwina with [facility] of change.  Start of care for  [facility] 24-48h post dc date.  Pt will need nebulizer machine for home, but states he has all needed other DME at home.  [facility] contacted for DME needs.  9/11 1048a debbie dowell rn,bsn spoke w pt who referred me to da. spoke w da. she would like hhrn and hhpt w [facility] to ck on her dad whenever he gets to go home. we are not sure when he will be dc but cm will cont to follow. pt refuses snf. pt's neighbor will ck on him and da chks on him. pt does live alone. spoke w edwinna at [facility] to alert her of hhc orders. they will cont to follow and if dc over weekend just need call.  9/9  1549 debbie dowell rn,bsn if pt goes home on ranexa it has 45.00 per month copay and no prior auth req.  09.04.15 [place] [phone] patient was identified was a high-risk for readmission. Alvis Lemmings is in the wings to admit patient from either the hospital or from SNF. Note clinical decline and transfer back to SDU level for bipap.  08/22/14 Ellan Lambert, RN, BSN [phone] Pt adm on 08/13/14 with GIB and abd pain; pt developed chest pain, pos troponins (NSTEMI) and pulmonary edema requiring intubation.  He is now extubated on telemetry floor, but on NRB mask.  Pt currently on milrinone drip and IV lasix.  Pt will need PT/OT consults when able to tolerate, but is limited by respiratory issues at this time.  Pt lives alone; has daughter.  Will follow progress.  08/15/14 KATHY MAHABIR RN,BSN NCM [phone] ESOPHAGRAM-NOTED PATIENTS REFUSAL FOR PROCEDURE.CXR TODAY.NO ANTICIPATED D/C NEEDS.  08/14/14 Marney Doctor RN CM Pt from home alone.  Has home O2 from [facility].  [facility]-DME rep called to inform of pt services.  Pt states that he has a travel O2 tank that his daughter can bring to the hospital for him to use on the way home.  Will await PT eval and recommendations and assist with DC needs as needed.

## 2014-08-15 NOTE — Procedures (Signed)
Central Venous Catheter Insertion Procedure Note Douglas Mann [id number] 1946-09-01  Procedure: Insertion of Central Venous Catheter Indications: Assessment of intravascular volume, Drug and/or fluid administration and Frequent blood sampling  Procedure Details Consent: Unable to obtain consent because of emergent medical necessity. Time Out: Verified patient identification, verified procedure, site/side was marked, verified correct patient position, special equipment/implants available, medications/allergies/relevent history reviewed, required imaging and test results available.  Performed  Maximum sterile technique was used including antiseptics, cap, gloves, gown, hand hygiene, mask and sheet. Skin prep: Chlorhexidine; local anesthetic administered A antimicrobial bonded/coated triple lumen catheter was placed in the left internal jugular vein using the Seldinger technique. Ultrasound guidance used.Yes.   Catheter placed to 20 cm. Blood aspirated via all 3 ports and then flushed x 3. Line sutured x 2 and dressing applied.  Evaluation Blood flow good Complications: No apparent complications Patient did tolerate procedure well. Chest X-ray ordered to verify placement.  CXR: pending.  Georgann Housekeeper, ACNP [facility] Pulmonology/Critical Care Pager [phone] or [phone]

## 2014-08-15 NOTE — Consult Note (Signed)
  Mr. Barret was transported to Radiology for an esophagram.  I explained the purpose of the exam and the procedure. He said he couldn't stand or lie down, although he walked to the exam table. He was placed in a slightly reclined upright position which he accepted. However, when I handed him the cup of barium to sip through a straw he stated he could not swallow it, even though he had not tried. He explained that Ensure had caused him severe chest pain when he tried some earlier. I explained that was the reason we were trying to do this exam, so we could tell what was happening that caused the severe pain. He said it would "kill him" to try to swallow the barium. I again explained that this was the best way to determine what was causing the pain so we could give him appropriate treatment to solve the problem and that without this exam we wouldn't know how to best treat him. Mr. Vonbehren still refused to proceed and told us to get him "out of here". We complied. He was polite but firm in his refusal and thanked me politely.  Nikki Dom. Zigmund Daniel, [place] Radiologist

## 2014-08-16 ENCOUNTER — Encounter (HOSPITAL_COMMUNITY)
Admission: EM | Disposition: A | Discharge status: Home Health | Payer: Medicare Other | Source: Home / Self Care | Attending: Internal Medicine

## 2014-08-16 ENCOUNTER — Inpatient Hospital Stay (HOSPITAL_COMMUNITY): Payer: Medicare Other

## 2014-08-16 DIAGNOSIS — I2510 Atherosclerotic heart disease of native coronary artery without angina pectoris: Secondary | ICD-10-CM

## 2014-08-16 DIAGNOSIS — I255 Ischemic cardiomyopathy: Secondary | ICD-10-CM | POA: Diagnosis not present

## 2014-08-16 DIAGNOSIS — I25110 Atherosclerotic heart disease of native coronary artery with unstable angina pectoris: Secondary | ICD-10-CM | POA: Clinically undetermined

## 2014-08-16 DIAGNOSIS — I517 Cardiomegaly: Secondary | ICD-10-CM

## 2014-08-16 DIAGNOSIS — I5041 Acute combined systolic (congestive) and diastolic (congestive) heart failure: Secondary | ICD-10-CM | POA: Diagnosis not present

## 2014-08-16 DIAGNOSIS — R570 Cardiogenic shock: Secondary | ICD-10-CM

## 2014-08-16 HISTORY — PX: CARDIAC CATHETERIZATION: SHX172

## 2014-08-16 HISTORY — PX: PERCUTANEOUS CORONARY STENT INTERVENTION (PCI-S): SHX5485

## 2014-08-16 LAB — LACTIC ACID, PLASMA
Lactic Acid, Venous: 3 mmol/L — ABNORMAL HIGH (ref 0.5–2.2)
Lactic Acid, Venous: 3 mmol/L — ABNORMAL HIGH (ref 0.5–2.2)

## 2014-08-16 LAB — GLUCOSE, CAPILLARY
Glucose-Capillary: 209 mg/dL — ABNORMAL HIGH (ref 70–99)
Glucose-Capillary: 165 mg/dL — ABNORMAL HIGH (ref 70–99)
Glucose-Capillary: 62 mg/dL — ABNORMAL LOW (ref 70–99)
Glucose-Capillary: 164 mg/dL — ABNORMAL HIGH (ref 70–99)
Glucose-Capillary: 212 mg/dL — ABNORMAL HIGH (ref 70–99)
Glucose-Capillary: 81 mg/dL (ref 70–99)
Glucose-Capillary: 172 mg/dL — ABNORMAL HIGH (ref 70–99)
Glucose-Capillary: 163 mg/dL — ABNORMAL HIGH (ref 70–99)

## 2014-08-16 LAB — MRSA PCR SCREENING: MRSA by PCR: NEGATIVE

## 2014-08-16 LAB — BASIC METABOLIC PANEL
ANION GAP: 17 — AB (ref 5–15)
BUN: 13 mg/dL (ref 6–23)
CALCIUM: 8.1 mg/dL — AB (ref 8.4–10.5)
CO2: 19 mEq/L (ref 19–32)
Chloride: 99 mEq/L (ref 96–112)
Creatinine, Ser: 1.24 mg/dL (ref 0.50–1.35)
GFR calc Af Amer: 66 mL/min — ABNORMAL LOW (ref >90)
GFR calc non Af Amer: 57 mL/min — ABNORMAL LOW (ref >90)
Glucose, Bld: 188 mg/dL — ABNORMAL HIGH (ref 70–99)
Potassium: 4.4 mEq/L (ref 3.7–5.3)
Sodium: 135 mEq/L — ABNORMAL LOW (ref 137–147)

## 2014-08-16 LAB — POCT I-STAT 3, ART BLOOD GAS (G3+)
ACID-BASE DEFICIT: 7 mmol/L — AB (ref 0.0–2.0)
Acid-base deficit: 6 mmol/L — ABNORMAL HIGH (ref 0.0–2.0)
BICARBONATE: 21.6 meq/L (ref 20.0–24.0)
BICARBONATE: 19.2 meq/L — AB (ref 20.0–24.0)
O2 Saturation: 100 %
O2 Saturation: 90 %
PCO2 ART: 37.1 mmHg (ref 35.0–45.0)
PO2 ART: 314 mmHg — AB (ref 80.0–100.0)
PO2 ART: 59 mmHg — AB (ref 80.0–100.0)
Patient temperature: 96.4
TCO2: 23 mmol/L (ref 0–100)
TCO2: 20 mmol/L (ref 0–100)
pCO2 arterial: 49.9 mmHg — ABNORMAL HIGH (ref 35.0–45.0)
pH, Arterial: 7.238 — ABNORMAL LOW (ref 7.350–7.450)
pH, Arterial: 7.316 — ABNORMAL LOW (ref 7.350–7.450)

## 2014-08-16 LAB — PHOSPHORUS: PHOSPHORUS: 3.9 mg/dL (ref 2.3–4.6)

## 2014-08-16 LAB — CBC WITH DIFFERENTIAL/PLATELET
BASOS ABS: 0 10*3/uL (ref 0.0–0.1)
Basophils Relative: 0 % (ref 0–1)
Eosinophils Absolute: 0 10*3/uL (ref 0.0–0.7)
Eosinophils Relative: 0 % (ref 0–5)
HEMATOCRIT: 30.1 % — AB (ref 39.0–52.0)
Hemoglobin: 10 g/dL — ABNORMAL LOW (ref 13.0–17.0)
Lymphocytes Relative: 6 % — ABNORMAL LOW (ref 12–46)
Lymphs Abs: 0.9 10*3/uL (ref 0.7–4.0)
MCH: 30.5 pg (ref 26.0–34.0)
MCHC: 33.2 g/dL (ref 30.0–36.0)
MCV: 91.8 fL (ref 78.0–100.0)
MONO ABS: 0.8 10*3/uL (ref 0.1–1.0)
Monocytes Relative: 5 % (ref 3–12)
NEUTROS ABS: 14.9 10*3/uL — AB (ref 1.7–7.7)
NEUTROS PCT: 89 % — AB (ref 43–77)
Platelets: 195 10*3/uL (ref 150–400)
RBC: 3.28 MIL/uL — ABNORMAL LOW (ref 4.22–5.81)
RDW: 13.8 % (ref 11.5–15.5)
WBC: 16.6 10*3/uL — AB (ref 4.0–10.5)

## 2014-08-16 LAB — TROPONIN I: Troponin I: >20 ng/mL (ref <0.30)

## 2014-08-16 LAB — MAGNESIUM: Magnesium: 4.9 mg/dL — ABNORMAL HIGH (ref 1.5–2.5)

## 2014-08-16 LAB — HEPARIN LEVEL (UNFRACTIONATED): Heparin Unfractionated: 0.3 IU/mL (ref 0.30–0.70)

## 2014-08-16 LAB — POCT ACTIVATED CLOTTING TIME: ACTIVATED CLOTTING TIME: 360 s

## 2014-08-16 SURGERY — PERCUTANEOUS CORONARY STENT INTERVENTION (PCI-S)
Anesthesia: LOCAL

## 2014-08-16 MED ORDER — CLOPIDOGREL BISULFATE 75 MG PO TABS
75.0000 mg | ORAL_TABLET | Freq: Every day | ORAL | Status: DC
  Administered 2014-08-16 – 2014-08-20 (×5): 75 mg
  Filled 2014-08-16 (×7): qty 1

## 2014-08-16 MED ORDER — SODIUM CHLORIDE 0.9 % IJ SOLN
3.0000 mL | Freq: Two times a day (BID) | INTRAMUSCULAR | Status: DC
  Administered 2014-08-17 – 2014-08-18 (×2): 3 mL via INTRAVENOUS

## 2014-08-16 MED ORDER — NOREPINEPHRINE BITARTRATE 1 MG/ML IV SOLN
2.0000 ug/min | INTRAVENOUS | Status: DC
  Administered 2014-08-16: 30 ug/min via INTRAVENOUS
  Administered 2014-08-16: 10 ug/min via INTRAVENOUS
  Administered 2014-08-17: 15 ug/min via INTRAVENOUS
  Administered 2014-08-19: 22 ug/min via INTRAVENOUS
  Administered 2014-08-20: 24 ug/min via INTRAVENOUS
  Filled 2014-08-16 (×6): qty 16

## 2014-08-16 MED ORDER — SODIUM CHLORIDE 0.9 % IV SOLN
INTRAVENOUS | Status: DC
  Administered 2014-08-16: 01:00:00 via INTRAVENOUS

## 2014-08-16 MED ORDER — CHLORHEXIDINE GLUCONATE 0.12 % MT SOLN
15.0000 mL | Freq: Two times a day (BID) | OROMUCOSAL | Status: DC
  Administered 2014-08-16 – 2014-08-20 (×9): 15 mL via OROMUCOSAL
  Filled 2014-08-16 (×9): qty 15

## 2014-08-16 MED ORDER — ATORVASTATIN CALCIUM 40 MG PO TABS
40.0000 mg | ORAL_TABLET | Freq: Every day | ORAL | Status: DC
Start: 2014-08-16 — End: 2014-08-18
  Administered 2014-08-16 – 2014-08-17 (×2): 40 mg
  Filled 2014-08-16 (×3): qty 1

## 2014-08-16 MED ORDER — VERAPAMIL HCL 2.5 MG/ML IV SOLN
INTRAVENOUS | Status: AC
  Filled 2014-08-16: qty 2

## 2014-08-16 MED ORDER — VITAL AF 1.2 CAL PO LIQD
1000.0000 mL | ORAL | Status: DC
  Filled 2014-08-16 (×2): qty 1000

## 2014-08-16 MED ORDER — MIDAZOLAM HCL 2 MG/2ML IJ SOLN
1.0000 mg | INTRAMUSCULAR | Status: DC | PRN
  Administered 2014-08-16 – 2014-08-19 (×3): 2 mg via INTRAVENOUS
  Filled 2014-08-16 (×2): qty 2

## 2014-08-16 MED ORDER — BIVALIRUDIN 250 MG IV SOLR
INTRAVENOUS | Status: AC
  Filled 2014-08-16: qty 250

## 2014-08-16 MED ORDER — ASPIRIN 81 MG PO CHEW
81.0000 mg | CHEWABLE_TABLET | Freq: Every day | ORAL | Status: DC
  Administered 2014-08-16 – 2014-08-19 (×4): 81 mg via ORAL
  Filled 2014-08-16 (×4): qty 1

## 2014-08-16 MED ORDER — VITAL AF 1.2 CAL PO LIQD
1000.0000 mL | ORAL | Status: DC
  Administered 2014-08-17 – 2014-08-20 (×3): 1000 mL
  Filled 2014-08-16 (×7): qty 1000

## 2014-08-16 MED ORDER — MIDAZOLAM HCL 2 MG/2ML IJ SOLN: 1.0000 mg | INTRAMUSCULAR | Status: DC | PRN

## 2014-08-16 MED ORDER — CETYLPYRIDINIUM CHLORIDE 0.05 % MT LIQD
7.0000 mL | Freq: Four times a day (QID) | OROMUCOSAL | Status: DC
  Administered 2014-08-16 – 2014-08-20 (×17): 7 mL via OROMUCOSAL

## 2014-08-16 MED ORDER — PIPERACILLIN-TAZOBACTAM 3.375 G IVPB
3.3750 g | Freq: Three times a day (TID) | INTRAVENOUS | Status: DC
  Administered 2014-08-17 – 2014-08-19 (×8): 3.375 g via INTRAVENOUS
  Filled 2014-08-16 (×12): qty 50

## 2014-08-16 MED ORDER — DEXTROSE 50 % IV SOLN
INTRAVENOUS | Status: AC
  Administered 2014-08-16: 25 mL
  Filled 2014-08-16: qty 50

## 2014-08-16 MED ORDER — MIDAZOLAM HCL 2 MG/2ML IJ SOLN
INTRAMUSCULAR | Status: AC
  Filled 2014-08-16: qty 2

## 2014-08-16 MED ORDER — ACETAMINOPHEN 160 MG/5ML PO SOLN
650.0000 mg | Freq: Four times a day (QID) | ORAL | Status: DC | PRN
  Administered 2014-08-16 – 2014-08-19 (×2): 650 mg
  Filled 2014-08-16 (×2): qty 20.3

## 2014-08-16 MED ORDER — VITAMIN B-1 100 MG PO TABS
100.0000 mg | ORAL_TABLET | Freq: Every day | ORAL | Status: DC
  Administered 2014-08-16 – 2014-08-19 (×4): 100 mg
  Filled 2014-08-16 (×4): qty 1

## 2014-08-16 MED ORDER — NITROGLYCERIN 1 MG/10 ML FOR IR/CATH LAB
INTRA_ARTERIAL | Status: AC
  Filled 2014-08-16: qty 10

## 2014-08-16 MED ORDER — FUROSEMIDE 10 MG/ML IJ SOLN
20.0000 mg | Freq: Two times a day (BID) | INTRAMUSCULAR | Status: DC
  Administered 2014-08-16: 20 mg via INTRAVENOUS
  Filled 2014-08-16 (×2): qty 2

## 2014-08-16 MED ORDER — VANCOMYCIN HCL 10 G IV SOLR
1250.0000 mg | Freq: Once | INTRAVENOUS | Status: AC
  Administered 2014-08-16: 1250 mg via INTRAVENOUS
  Filled 2014-08-16: qty 1250

## 2014-08-16 MED ORDER — HEPARIN (PORCINE) IN NACL 100-0.45 UNIT/ML-% IJ SOLN
INTRAMUSCULAR | Status: AC
  Filled 2014-08-16: qty 250

## 2014-08-16 MED ORDER — FOLIC ACID 1 MG PO TABS
1.0000 mg | ORAL_TABLET | Freq: Every day | ORAL | Status: DC
Start: 2014-08-16 — End: 2014-08-19
  Administered 2014-08-16 – 2014-08-19 (×4): 1 mg
  Filled 2014-08-16 (×4): qty 1

## 2014-08-16 MED ORDER — SODIUM CHLORIDE 0.9 % IV SOLN
250.0000 mL | INTRAVENOUS | Status: DC | PRN
Start: 2014-08-16 — End: 2014-08-20

## 2014-08-16 MED ORDER — CLOPIDOGREL BISULFATE 300 MG PO TABS
300.0000 mg | ORAL_TABLET | Freq: Once | ORAL | Status: AC
  Administered 2014-08-16: 300 mg via ORAL
  Filled 2014-08-16: qty 1

## 2014-08-16 MED ORDER — LIDOCAINE HCL (PF) 1 % IJ SOLN
INTRAMUSCULAR | Status: AC
  Filled 2014-08-16: qty 30

## 2014-08-16 MED ORDER — DEXTROSE 10 % IV SOLN
INTRAVENOUS | Status: DC
  Administered 2014-08-16: 10:00:00 via INTRAVENOUS

## 2014-08-16 MED ORDER — PIPERACILLIN-TAZOBACTAM 3.375 G IVPB 30 MIN
3.3750 g | Freq: Once | INTRAVENOUS | Status: AC
  Administered 2014-08-16: 3.375 g via INTRAVENOUS
  Filled 2014-08-16: qty 50

## 2014-08-16 MED ORDER — SODIUM CHLORIDE 0.9 % IJ SOLN: 3.0000 mL | INTRAMUSCULAR | Status: DC | PRN

## 2014-08-16 MED ORDER — FUROSEMIDE 10 MG/ML IJ SOLN
20.0000 mg | Freq: Once | INTRAMUSCULAR | Status: AC
  Administered 2014-08-16: 20 mg via INTRAVENOUS

## 2014-08-16 MED ORDER — SODIUM CHLORIDE 0.9 % IV SOLN
INTRAVENOUS | Status: AC
  Administered 2014-08-16: 23:00:00 via INTRAVENOUS

## 2014-08-16 MED ORDER — HEPARIN (PORCINE) IN NACL 2-0.9 UNIT/ML-% IJ SOLN
INTRAMUSCULAR | Status: AC
  Filled 2014-08-16: qty 1000

## 2014-08-16 MED ORDER — HEPARIN (PORCINE) IN NACL 100-0.45 UNIT/ML-% IJ SOLN
850.0000 [IU]/h | INTRAMUSCULAR | Status: DC
  Filled 2014-08-16: qty 250

## 2014-08-16 MED ORDER — PRO-STAT SUGAR FREE PO LIQD
30.0000 mL | Freq: Two times a day (BID) | ORAL | Status: DC
  Administered 2014-08-16 – 2014-08-19 (×6): 30 mL
  Filled 2014-08-16 (×10): qty 30

## 2014-08-16 MED ORDER — HEPARIN (PORCINE) IN NACL 100-0.45 UNIT/ML-% IJ SOLN
750.0000 [IU]/h | INTRAMUSCULAR | Status: DC
  Administered 2014-08-16: 750 [IU]/h via INTRAVENOUS
  Filled 2014-08-16: qty 250

## 2014-08-16 MED ORDER — VANCOMYCIN HCL IN DEXTROSE 750-5 MG/150ML-% IV SOLN
750.0000 mg | Freq: Two times a day (BID) | INTRAVENOUS | Status: DC
  Administered 2014-08-17: 750 mg via INTRAVENOUS
  Filled 2014-08-16 (×2): qty 150

## 2014-08-16 MED ORDER — DEXTROSE 50 % IV SOLN: 25.0000 mL | Freq: Once | INTRAVENOUS | Status: AC | PRN

## 2014-08-16 MED ORDER — VITAL HIGH PROTEIN PO LIQD: 1000.0000 mL | ORAL | Status: DC

## 2014-08-16 MED ORDER — "THROMBI-PAD 3""X3"" EX PADS"
1.0000 | MEDICATED_PAD | Freq: Once | CUTANEOUS | Status: AC
  Administered 2014-08-16: 1 via TOPICAL
  Filled 2014-08-16: qty 1

## 2014-08-16 MED ORDER — FENTANYL CITRATE 0.05 MG/ML IJ SOLN
0.0000 ug/h | INTRAMUSCULAR | Status: DC
  Filled 2014-08-16: qty 50

## 2014-08-16 MED ORDER — BUDESONIDE 0.25 MG/2ML IN SUSP
0.2500 mg | Freq: Two times a day (BID) | RESPIRATORY_TRACT | Status: DC
  Administered 2014-08-16 – 2014-08-18 (×5): 0.25 mg via RESPIRATORY_TRACT
  Filled 2014-08-16 (×7): qty 2

## 2014-08-16 MED ORDER — HEPARIN (PORCINE) IN NACL 100-0.45 UNIT/ML-% IJ SOLN
1050.0000 [IU]/h | INTRAMUSCULAR | Status: DC
  Administered 2014-08-17: 850 [IU]/h via INTRAVENOUS
  Filled 2014-08-16 (×3): qty 250

## 2014-08-16 MED ORDER — ONDANSETRON HCL 4 MG/2ML IJ SOLN: 4.0000 mg | Freq: Four times a day (QID) | INTRAMUSCULAR | Status: DC | PRN

## 2014-08-16 MED ORDER — FUROSEMIDE 10 MG/ML IJ SOLN
40.0000 mg | Freq: Two times a day (BID) | INTRAMUSCULAR | Status: DC
  Administered 2014-08-16 – 2014-08-17 (×2): 40 mg via INTRAVENOUS
  Filled 2014-08-16 (×4): qty 4

## 2014-08-16 NOTE — Progress Notes (Signed)
Patient ID:  Douglas Mann, male   DOB: 05/28/1946, 68 y.o.   MRN: [id number]  68 year old gentleman long-standing COPD, previously known CAD (unsure of details), PhD as well as hypertension and hyperlipidemia admitted to [facility] with GI bleeding and abdominal pain/postprandial epigastric pain he.  He also noted intermittent chest pain. On the evening of 8/27, he had an episode of severe substernal chest pain 10 out of 10 with acute onset dyspnea and diaphoresis. He was found to be hypoxic and in flash pulmonary edema on chest x-ray. He did not note improvement after nitroglycerin. He received 2 doses of IV Lasix 40 mg. During this episode he had 2 runs of sustained ventricular tachycardia. He never received CPR in case and converted. He was given amiodarone bolus. Troponin levels were 1.62 an EKG showed diffuse ST depressions with sinus tachycardia.  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  Cardiac catheterization revealed an occluded RCA, 90% proximal circumflex, severely reduced ejection fraction of 5-10% with LVEDP of 45 mmHg. Also severe PAD with occluded right common iliac and severe stenosis of the left common iliac making mechanical support impossible.  He was transferred to the CCU and started on Levothroid keep pressures with map greater than 61-95% and systolic pressures roughly 90 or greater.  Subjective:  Events of last PM & early AM noted (I was present)  Objective:  Vital Signs in the last 24 hours: Temp:  [96.3 F (35.7 C)-98.9 F (37.2 C)] 96.4 F (35.8 C) (08/28 0145) Pulse Rate:  [71-122] 99 (08/28 0040) Resp:  [13-20] 20 (08/28 0145) BP: (56-178)/(37-122) 92/61 mmHg (08/28 0145) SpO2:  [80 %-100 %] 100 % (08/28 0040) Arterial Line BP: (89-102)/(62-72) 89/62 mmHg (08/28 0145) FiO2 (%):  [40 %-100 %] 40 % (08/28 0147)  Intake/Output from  previous day: 08/27 0701 - 08/28 0700 In: 1352.4 [P.O.:240; I.V.:1112.4] Out: 1300 [Urine:1300] Intake/Output from this shift: Total I/O In: 212.4 [I.V.:212.4] Out: -   Physical Exam: General appearance: ~sedated, but arousable. Neck: no adenopathy, no carotid bruit and L IJ line with mild heme; + JVD Lungs: coarse rales /rhonchi bilateral Heart: tachy with RR, no M/R + S4 Abdomen: soft, non-tender; bowel sounds normal; no masses,  no organomegaly Extremities: extremities normal, atraumatic, no cyanosis or edema and cold Pulses: faint, Neurologic: Mental status: sedated  Lab Results:  Recent Labs  08/15/14 0400 08/15/14 1957  WBC 11.7* 15.1*  HGB 9.6* 11.1*  PLT 148* 223    Recent Labs  08/15/14 0400 08/15/14 1957  NA 136* 136*  K 4.7 4.3  CL 101 98  CO2 24 21  GLUCOSE 113* 254*  BUN 8 10  CREATININE 1.03 1.18    Recent Labs  08/14/14 0220 08/15/14 1957  TROPONINI <0.30 1.62*   Hepatic Function Panel  Recent Labs  08/15/14 1957  PROT 7.5  ALBUMIN 3.5  AST 26  ALT 11  ALKPHOS 83  BILITOT 0.5   No results found for this basename: CHOL,  in the last 72 hours No results found for this basename: PROTIME,  in the last 72 hours  Imaging: Reviewed in Epic  Cardiac Studies: See Report summarized above  EKG this AM - S Tachy with Inf Q waves c/w prior Inf MI ( -- correlates with RCA CTO)  Assessment/Plan:  Principal Problem:   MI, acute, non ST segment elevation Active Problems:   Peripheral arterial occlusive disease:  100% occluded right common iliac; focal 90 and diffuse 60-70% left common and external iliac   COPD, 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 EF 5 -10% by LV gram - following non-STEMI and ventricular tachycardia   HYPERLIPIDEMIA   GI bleed   HYPERTENSION   Acute pulmonary edema  Echo done this AM - prelim look - EF ~15% severe anterior HK to AK, mod-severe Inf-Post HK.    After reviewing films with Dr. Burt Knack this AM - he agrees that the LAD lesion is critical & has an acute unstable appearance.  He agrees that for the patient to have a chance of successful extubation & continued diuresis as well as potential for partial LV recovery, this will need to be addressed.   Mechanical support may be possible via IABP (is sheath is able to be advanced) vs. Potential 5L Impella done in hybrid OR.  Will d/w Adv. HF service MD & Dr. Prescott Gum re: this possibility.  ? Benefit of RHC.  Will continue Heparin IV, ASA & Plavix.  If IABP use is possible, I have quoted a ~20% mortality (spoke with daughter) with attempted PCI, but notably higher if it is not done.  He remains no CPR/Shock, but ok with pressors.  Continue IV Amiodarone give VT last PM.  Follow Troponin trend.  (ProBNP was elevated - & may well be higher)    For now - continue Levophed for pressure support & to allow for diuresis. ? Potential of adding nitrate for afterload reduction.  Hypoglycemic this AM - will use D10 for KVO fluid.  ON SSI - but no coverage given.  On Statin  As for GI issues -- once more stable hemodynamically & potentially no longer ischemic, would consider CTA Abdomen to look for Celiac Artery disease for potential of mesenteric ischemia.  SMA actually looked relatively normal.  Will defer Vent/Sedation & COPD Rx to PCCM -- convert to steroid nebulizer & DuoNeb.   The patient is critically ill with multisystem organ failure requiring extensive monitoring & medication management.    Critical Care Time ~45 min to 1 hr total    LOS: 3 days    Douglas Mann  W 08/16/2014, 2:37 AM

## 2014-08-16 NOTE — CV Procedure (Signed)
CARDIAC CATH NOTE  Name: Douglas Mann MRN: [id number] DOB: June 20, 1946  Procedure: PTCA and stenting of the proximal LAD, IABP placement under fluoroscopic guidance  Indication: Acute MI, shock. This is a 68 year old gentleman with critical illness. He initially presented with atypical symptoms, then developed severe 10/10 chest pain and cardiac arrest. He was brought emergently to the cardiac catheterization laboratory last night and found to have critical stenosis of the proximal LAD, total occlusion of the RCA, and a very severe LV dysfunction with an ejection fraction of 10%. The patient was treated supportively with mechanical ventilation and vasopressor therapy. Extensive discussion has occurred today with the family. The case is been discussed amongst multiple interventionalists as well as cardiac surgery. We determine the best course of treatment for this patient is high risk PCI with intra-aortic balloon pump placement. He has extensive peripheral arterial disease but we felt an attempt at intra-aortic balloon pump placement from the left femoral artery might be successful. There is an indwelling left femoral artery sheath in place.  Procedural Details: The right wrist was prepped, draped, and anesthetized with 1% lidocaine. Using the modified Seldinger technique, a 6 Fr sheath was introduced into the radial artery. 3 mg verapamil was administered through the radial sheath. Weight-based bivalirudin was given for anticoagulation. Once a therapeutic ACT was achieved, a 6 [place] XB LAD guide catheter was inserted.    At that point, a JR 4 catheter was used to direct a Woolley wire into the central aorta and the indwelling 6 French sheath in the left femoral artery was changed out for a 7.5 French intra-aortic balloon pump sheath. A JR 4 catheter again was used to access the central aorta with a Woolley wire and this wire was changed out for 0.025 inch wire. The intra-aortic balloon was advanced  over that wire into the central aorta without difficulty. The balloon pump augmented at 1:1. Attention was then turned back to the PCI procedure. A cougar coronary guidewire was used to cross the lesion.  The lesion was predilated with a 2.5 mm balloon.  The balloon had difficulty crossing the lesion but eventually was able to force across with extra guide support. Multiple inflations were done to 12 atmospheres.The lesion was then stented with a 3.5 x 16 mm Promus DES stent.  The stent was postdilated with a 3.75 noncompliant balloon.  Following PCI, there was 0% residual stenosis and TIMI-3 flow. Final angiography confirmed an excellent result. There was mild hypodense appearing stenosis in the distal left mainstem and mild to moderate stenosis beyond the stented segment, but I did not feel like either of these areas required treatment. The patient tolerated the procedure well. There were no immediate procedural complications. A TR band was used for radial hemostasis. The intra-aortic balloon pump was sutured in place. The patient was transferred back to the CCU at the completion of the procedure. He remains in critical condition.   Lesion Data: Vessel: LAD/proximal Percent stenosis (pre): 90 TIMI-flow (pre):  3 Stent:  3.5 x 16 mm Promus DES Percent stenosis (post): 0 TIMI-flow (post): 3  Contrast: 95 cc Omnipaque  Radiation dose/Fluoro time: 7.8 minutes  Estimated Blood Loss: Minimal  Conclusions: Successful intra-aortic balloon pump placement and PCI of the proximal LAD  Recommendations: Angiomax will be continued until the current bag is completed. The patient will be given a one-time dose of Plavix 300 mg. He has been chronically treated with Plavix 75 mg daily. 4 hours after Angiomax was off, IV  heparin will be started and continued while the intra-aortic balloon is in place. I would anticipate keeping the intra-aortic balloon has been placed for about 48 hours to allow for myocardial  recovery as long as the patient tolerates this.  Sherren Mocha MD, [facility] 08/16/2014, 9:16 PM

## 2014-08-16 NOTE — Progress Notes (Signed)
[place] for heparin Indication: for IABP, to start 4 hrs after bivalirudin turned off  No Known Allergies  Patient Measurements: Height: 5\' 7"  (170.2 cm) Weight: 169 lb 8.5 oz (76.9 kg) IBW/kg (Calculated) : 66.1   Vital Signs: Temp: 100.2 F (37.9 C) (08/28 1909) Temp src: Oral (08/28 1909) BP: 96/66 mmHg (08/28 1556) Pulse Rate: 101 (08/28 2006) Intake/Output from previous day: 08/27 0701 - 08/28 0700 In: 2046.9 [P.O.:240; I.V.:1756.9; IV Piggyback:50] Out: 0623 [Urine:1550] Intake/Output from this shift: Total I/O In: 64.8 [I.V.:64.8] Out: 30 [Urine:30]  Labs:  Recent Labs  08/15/14 0400 08/15/14 1957 08/16/14 0400  WBC 11.7* 15.1* 16.6*  HGB 9.6* 11.1* 10.0*  PLT 148* 223 195  CREATININE 1.03 1.18 1.24   Estimated Creatinine Clearance: 51.8 ml/min (by C-G formula based on Cr of 1.24). No results found for this basename: VANCOTROUGH, Corlis Leak, VANCORANDOM, [place], GENTPEAK, [place]wa Gentry, [place], TOBRAPEAK, TOBRARND, AMIKACINPEAK, AMIKACINTROU, AMIKACIN,  in the last 72 hours   Microbiology: Recent Results (from the past 720 hour(s))  MRSA PCR SCREENING     Status: None   Collection Time    08/16/14  1:59 AM      Result Value Ref Range Status   MRSA by PCR NEGATIVE  NEGATIVE Final   Comment:            The GeneXpert MRSA Assay (FDA     approved for NASAL specimens     only), is one component of a     comprehensive MRSA colonization     surveillance program. It is not     intended to diagnose MRSA     infection nor to guide or     monitor treatment for     MRSA infections.     Assessment: 68 yo male with VDRF s/p VT and NSTEMI. He is also s/p cath with multivessel CAD on heparin at 750 units/hr and initial heparin level was= 0.3 He was also admitted to [facility] 8/25 with concern of GIB (also with history of rectal bleeding) and currently Hg stable. GI has seen and concern for mesenteric ischemia. Now he is s/p PTCA and stenting  of proximal LAD and IABP placement.  Pharmacy consulted to start heparin drip for the IABP 4 hours after the bivalirudin drip stopped.  Per RN bival drip should be off ~ 2140.  No bleeding reported.   Goal of Therapy: Heparin level 0.2 -0.5 for IABP  Plan:  -start heparin drip 4 hours after bivalirudrin drip turned off (at 0145am) at rate of 750 units/hr and check 8 hr HL at 10am -daily HL and CBC  Eudelia Bunch, Pharm.D. [phone] 08/16/2014 9:46 PM

## 2014-08-16 NOTE — Progress Notes (Addendum)
CONSULT NOTE - INITIAL  Pharmacy Consult for vancomycin, zosyn; heparin Indication: PNA; NSTEMI with severe multi-vessel CAD   No Known Allergies  Patient Measurements: Height: 5\' 7"  (170.2 cm) Weight: 163 lb 4.8 oz (74.072 kg) IBW/kg (Calculated) : 66.1   Vital Signs: Temp: 101.3 F (38.5 C) (08/28 1000) Temp src: Oral (08/28 0300) BP: 117/84 mmHg (08/28 0800) Pulse Rate: 103 (08/28 1000) Intake/Output from previous day: 08/27 0701 - 08/28 0700 In: 2046.9 [P.O.:240; I.V.:1756.9; IV Piggyback:50] Out: 3329 [Urine:1550] Intake/Output from this shift: Total I/O In: 116.8 [I.V.:116.8] Out: 410 [Urine:410]  Labs:  Recent Labs  08/15/14 0400 08/15/14 1957 08/16/14 0400  WBC 11.7* 15.1* 16.6*  HGB 9.6* 11.1* 10.0*  PLT 148* 223 195  CREATININE 1.03 1.18 1.24   Estimated Creatinine Clearance: 51.8 ml/min (by C-G formula based on Cr of 1.24). No results found for this basename: VANCOTROUGH, Corlis Leak, VANCORANDOM, [place], GENTPEAK, [place], [place], TOBRAPEAK, TOBRARND, AMIKACINPEAK, AMIKACINTROU, AMIKACIN,  in the last 72 hours   Microbiology: Recent Results (from the past 720 hour(s))  MRSA PCR SCREENING     Status: None   Collection Time    08/16/14  1:59 AM      Result Value Ref Range Status   MRSA by PCR NEGATIVE  NEGATIVE Final   Comment:            The GeneXpert MRSA Assay (FDA     approved for NASAL specimens     only), is one component of a     comprehensive MRSA colonization     surveillance program. It is not     intended to diagnose MRSA     infection nor to guide or     monitor treatment for     MRSA infections.    Medical History: Past Medical History  Diagnosis Date  . HYPERLIPIDEMIA 10/01/2009  . HYPERTENSION 10/01/2009  . CAD 10/01/2009    Stent at [facility] greater than 10 years ago  . PVD 10/01/2009  . ALLERGIC RHINITIS 12/24/2009  . COPD 10/01/2009  . 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 antiseptic oral rinse  7 mL Mouth Rinse QID  . aspirin  81 mg Oral Daily  . atorvastatin  40 mg Per Tube q1800  . budesonide (PULMICORT) nebulizer solution  0.25 mg Nebulization BID  . capsaicin   Topical BID  . chlorhexidine  15 mL Mouth Rinse BID  . clopidogrel  75 mg Per Tube Daily  . fentaNYL  50 mcg Intravenous Once  . folic acid  1 mg Per Tube Daily  . furosemide  40 mg Intravenous Q12H  . hydrocortisone   Rectal BID  . insulin aspart  2-6 Units Subcutaneous 6 times per day  . ipratropium-albuterol  3 mL Nebulization Q4H  . pantoprazole (PROTONIX) IV  40 mg Intravenous Q12H  . sodium chloride  3 mL Intravenous Q12H  . sodium chloride  3 mL Intravenous Q12H  . sucralfate  1 g Oral TID WC & HS  . thiamine  100 mg Per Tube Daily  . triamcinolone cream   Topical BID    Assessment: 68 yo male with VDRF s/p VT and NSTEMI noted with WBC= 16.6, tmax= 101.3 and CXR with infiltrate to begin zosyn and vancomycin for possible HCAP. SCr= 1.24 and CrCl ~50  He is also s/p cath with multivessel CAD on heparin at 750 units/hr and initial heparin level is on the low end of goal (HL= 0.3). He  was also admitted to [facility] 8/25 with concern of GIB (also with history of rectal bleeding) and currently Hg stable. GI has seen and concern for mesenteric ischemia. Noted plans cath today.  8/28 vanc 8/28 zosyn  8/28 blood x2 8/28 resp  Goal of Therapy:  Vancomycin trough level 15-20 mcg/ml  Plan:  -Zosyn 3.375gm IV q8h -Vancomycin 1250mg  IV x1 followed by 750mg  IV q12h -Will follow renal function, cultures and clinical progress -Increase heparin to 850 units/hr to keep in goal -Will follow post cath  Hildred Laser, Pharm D 08/16/2014 11:24 AM

## 2014-08-16 NOTE — Interval H&P Note (Signed)
History and Physical Interval Note:  08/16/2014 8:05 PM  Douglas Mann  has presented today for surgery, with the diagnosis of chest pain  The various methods of treatment have been discussed with the patient and family. After consideration of risks, benefits and other options for treatment, the patient has consented to  Procedure(s): LEFT HEART CATH (Bilateral) as a surgical intervention .  The patient's history has been reviewed, patient examined, no change in status, stable for surgery.  I have reviewed the patient's chart and labs.  Questions were answered to the patient's satisfaction.    Extensive discussion throughout the day. Reviewed films and clinical scenario with Dr Ellyn Hack. Pt with acute MI and cardiogenic shock. Critical stenosis of the prox LAD noted. Reviewed plans for PCI with the patient's daughter who understands the high-risk nature of the procedure. Will attempt to place an IABP but will likely be technically challenging with his PAD.  Cath Lab Visit (complete for each Cath Lab visit)  Clinical Evaluation Leading to the Procedure:   ACS: Yes.    Non-ACS:    Anginal Classification: CCS IV  Anti-ischemic medical therapy: No Therapy  Non-Invasive Test Results: No non-invasive testing performed  Prior CABG: No previous CABG       Sherren Mocha

## 2014-08-16 NOTE — Progress Notes (Addendum)
PULMONARY / CRITICAL CARE MEDICINE   Name: Douglas Mann MRN: [id number] DOB: 08/01/1946    ADMISSION DATE:  08/13/2014 CONSULTATION DATE:  08/15/2014   REFERRING MD :   [facility]  CHIEF COMPLAINT:  NSTEMI - > V tach with BP -> Acute pulm edema -> VDRF -> shock  INITIAL PRESENTATION:  68 y/o male admitted on 8/25 for lower GI bleeding and abdominal pain who developed chest pain, NSTEMI, VTACH, on 8/27.  He was intubated and went to the cath lab where he was found to have a markedly elevated LVEDP.   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, LAD 90% stenosed, severe ischemic cardiomyopathy LVEF 5-10%  SUBJECTIVE: currently awake on vent, wants tube out  VITAL SIGNS: Temp:  [96.3 F (35.7 C)-101.3 F (38.5 C)] 101.3 F (38.5 C) (08/28 1000) Pulse Rate:  [78-122] 103 (08/28 1000) Resp:  [13-30] 17 (08/28 1000) BP: (56-178)/(37-122) 117/84 mmHg (08/28 0800) SpO2:  [80 %-100 %] 94 % (08/28 1000) Arterial Line BP: (87-113)/(62-72) 101/62 mmHg (08/28 1000) FiO2 (%):  [30 %-100 %] 30 % (08/28 1000) HEMODYNAMICS: CVP:  [12 mmHg] 12 mmHg VENTILATOR SETTINGS: Vent Mode:  [-] PSV;CPAP FiO2 (%):  [30 %-100 %] 30 % Set Rate:  [16 bmp-30 bmp] 30 bmp Vt Set:  [540 mL-600 mL] 600 mL PEEP:  [5 cmH20] 5 cmH20 Pressure Support:  [5 cmH20] 5 cmH20 Plateau Pressure:  [23 cmH20-34 cmH20] 34 cmH20 INTAKE / OUTPUT:  Intake/Output Summary (Last 24 hours) at 08/16/14 1052 Last data filed at 08/16/14 1000  Gross per 24 hour  Intake 1923.68 ml  Output   1560 ml  Net 363.68 ml    PHYSICAL EXAMINATION: General:  Awake on vent HEENT: NCAT, ETT PULM: rhonchi bilaterally CV: distant heart sounds, RRR AB: BS+, soft Ext: cool, no edema Neuro: awake, alert, comfortable  LABS:  PULMONARY  Recent Labs Lab 08/16/14 0142 08/16/14 0253  PHART 7.238* 7.316*  PCO2ART 49.9* 37.1  PO2ART 314.0* 59.0*  HCO3 21.6 19.2*  TCO2 23 20   O2SAT 100.0 90.0    CBC  Recent Labs Lab 08/15/14 0400 08/15/14 1957 08/16/14 0400  HGB 9.6* 11.1* 10.0*  HCT 29.2* 33.7* 30.1*  WBC 11.7* 15.1* 16.6*  PLT 148* 223 195    COAGULATION  Recent Labs Lab 08/13/14 1620  INR 0.97    CARDIAC    Recent Labs Lab 08/13/14 1620 08/13/14 2049 08/14/14 0220 08/15/14 1957 08/16/14 0900  TROPONINI <0.30 <0.30 <0.30 1.62* >20.00*    Recent Labs Lab 08/15/14 1956  PROBNP 2319.0*     CHEMISTRY  Recent Labs Lab 08/13/14 1620 08/14/14 0220 08/15/14 0400 08/15/14 1957 08/15/14 2144 08/16/14 0400  NA 138 137 136* 136*  --  135*  K 4.7 4.2 4.7 4.3  --  4.4  CL 99 102 101 98  --  99  CO2 25 24 24 21   --  19  GLUCOSE 97 111* 113* 254*  --  188*  BUN 18 12 8 10   --  13  CREATININE 1.14 1.03 1.03 1.18  --  1.24  CALCIUM 9.9 8.7 9.0 9.1  --  8.1*  MG  --   --   --   --  1.9 4.9*  PHOS  --   --   --   --  4.8* 3.9   Estimated Creatinine Clearance: 51.8 ml/min (by C-G formula based on Cr of 1.24).  LIVER  Recent Labs Lab 08/13/14 1620 08/14/14 0220 08/15/14 1957  AST 17 16 26   ALT 12 9 11   ALKPHOS 86 64 83  BILITOT 0.4 0.3 0.5  PROT 8.2 6.1 7.5  ALBUMIN 4.1 3.2* 3.5  INR 0.97  --   --      INFECTIOUS  Recent Labs Lab 08/13/14 1650 08/16/14 0900  LATICACIDVEN 0.91 3.0*     ENDOCRINE CBG (last 3)   Recent Labs  08/16/14 0222 08/16/14 0422 08/16/14 0929  GLUCAP 209* 165* 62*         IMAGING x48h Dg Chest 1 View  08/15/2014   CLINICAL DATA:  rapid response, sob, tachycardia  EXAM: CHEST - 1 VIEW  COMPARISON:  08/15/2014 at 0515 hr  FINDINGS: Interval development of moderate pulmonary edema with perihilar and basilar alveolar edema. Cardiopericardial silhouette partially obscured but grossly unchanged compared to prior. No pleural effusion. No pneumothorax. Old RIGHT distal clavicle fracture.  IMPRESSION: Interval development of moderate bilateral airspace disease consistent with  pulmonary edema.   Electronically Signed   By: Dereck Ligas M.D.   On: 08/15/2014 19:58   Dg Chest Port 1 View  08/16/2014   CLINICAL DATA:  Evaluate endotracheal tube  EXAM: PORTABLE CHEST - 1 VIEW  COMPARISON:  08/15/2014  FINDINGS: Endotracheal tube tip about 5.8 cm above the carina. Left central line in unchanged position. NG tube again crosses the gastroesophageal junction. Extensive infiltrate right middle and lower lobe stable. No significant findings on the left.  IMPRESSION: Endotracheal tube as described.  Persistent right-sided infiltrate.   Electronically Signed   By: Skipper Cliche M.D.   On: 08/16/2014 07:37   Dg Chest Port 1 View  08/15/2014   CLINICAL DATA:  Line and tube placement.  EXAM: PORTABLE CHEST - 1 VIEW  COMPARISON:  08/15/2014.  FINDINGS: Interval left jugular catheter with its tip in the proximal superior vena cava. No pneumothorax. Interval endotracheal tube in satisfactory position. Interval nasogastric tube with its tip in the proximal stomach and side hole in the distal esophagus. Normal sized heart. No significant change in diffuse bilateral interstitial prominence and patchy airspace opacity in the right mid and lower lung zones and medial left lung base. Diffuse osteopenia.  IMPRESSION: 1. Tubes and catheter, as described above. 2. Stable changes of congestive heart failure.   Electronically Signed   By: Enrique Sack M.D.   On: 08/15/2014 22:04   Dg Chest Port 1 View  08/15/2014   CLINICAL DATA:  Chest pain  EXAM: PORTABLE CHEST - 1 VIEW  COMPARISON:  09/06/2011  FINDINGS: Small stable granuloma in the right upper lobe near the minor fissure. There is minor lung base linear/reticular opacity that is most likely atelectasis. Lungs are otherwise clear. No pleural effusion or pneumothorax.  Normal heart, mediastinum and hila.  Bony thorax is demineralized but grossly intact.  IMPRESSION: No active disease.   Electronically Signed   By: Lajean Manes M.D.   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  RENAL A:  Hyponatremia, mild P:   Monitor UOP, BMET  GASTROINTESTINAL A:  Mesenteric ischemia? Possible lower GI bleed on admission, no brisk bleeding now P:   SUP CT angio abdomen at some point this hospitalization reconsult GI after cardiac issues resolved Monitor lactic acid, if rising get CT abdomen sooner  HEMATOLOGIC A:  Normocytic anemia, Lower gi bleeding? P:  PRBC goal  hgb > 8gm% in setting of MI  INFECTIOUS A:  HCAP? Fever, infiltrate P:   BCx2 - 8/28 > UC none  Sputum- 8/28 > Vanc 8/28 > Zosyn 8/28 >  ENDOCRINE A:  No acute issues P:   Monitor glucose  NEUROLOGIC A:  RAS -3 post intubation meds, severe Anxiety P:   RASS goal: 0 to -2 Fent gtt PRN versed  TODAY'S SUMMARY: 68 y/o male with severe ischemic cardiomyopathy leading to cardiogenic shock and pulmonary edema requiring mechanical ventilation for respiratory failure.  Cath lab today.  Picture also worrisome for pneumonia given fever, infiltrate.  Add antibiotics.  Patient requests that we extubate as soon as possible but would want to be reintubated if needed  Daughter updated at bedside  CC time 50 minutes  Roselie Awkward, MD [facility] Pager: [phone] Cell: [phone] If no response, call [phone]  08/16/2014 10:52 AM

## 2014-08-16 NOTE — CV Procedure (Signed)
CARDIAC CATHETERIZATION REPORT  NAME:  Douglas Mann   MRN: [id number] DOB:  05-Jul-1946   ADMIT DATE: 08/13/2014 Procedure Date: 08/16/2014  INTERVENTIONAL CARDIOLOGIST: Leonie Man, M.D., MS PRIMARY CARE PROVIDER: [name], MD PRIMARY CARDIOLOGIST: New to [place] by Dr. Percival Spanish  PATIENT:  Douglas Mann is a 68 y.o. male a long-standing history of severe oxygen requiring COPD, known CAD status post PCI at least once in the past, but the daughter wished for MIs in the past. He also has hypertension and hyperlipidemia peripheral vascular disease .  He presented to [facility] on August 25 with chief complaint of bleeding from his bowels and swollen abdomen with significant epigastric discomfort. This has been intermittently off and on for about 2 years made worse with eating. He also has a sensation of something "busted out of his chest." He was initially anemic upon admission but his hemoglobin is back up to go to a normal level. He was seen by Dr. Percival Spanish in consultation on August 27 and felt that his symptoms were more GI than cardiac in nature simply based on the atypical nature of the symptoms. Several hours after that consultation was completed, the patient's suddenly developed respiratory distress with diaphoresis tachypnea and was hypoxic. He complained of severe 10 out of 10 chest pain. Chest x-ray showed acute pulmonary edema. He was given sublingual nitroglycerin with no improvement in 40 mg of IV Lasix twice. He eventually was intubated after he suffered ventricular tachycardia which spontaneously converted. He never did have CPR or receive defibrillation. He had an IV amiodarone bolus but never started the trip as he became hypotensive. Troponin levels came back at 1.62. After intubation he became height consistent with sinus tachycardia diffuse ST depressions in the inferior lateral leads. Dr. Fransico Him rapidly responded to [facility] ICU to  evaluate the patient and felt his presentation was probably consistent with acute coronary syndrome especially based on the positive troponins. Urgent non-STEMI was called and the patient was brought to the cardiac catheterization lab the [facility] after central line was placed for allowing access to treat with vasopressors for his hypotension.  Transfer was delayed while the central line was placed and chest x-ray was performed to confirm placement of the central line prior to initiating pressors and central line. The [facility] that pressors were stopped and his blood pressures were in the 110-120 mmHg range systolic.  Dr. Fransico Him talked with the patient's daughter and obtained verbal consent via telephone. With his hypotension during transport, the decision was made to use a femoral access in order to potentially place a mechanical support.  PRE-OPERATIVE DIAGNOSIS:    NonSTEMI  Flash pulmonary edema  Hypoxic respiratory failure  Ventricular Tachycardia  PROCEDURES PERFORMED:    Left Heart Catheterization with Native Coronary Angiography  via left common femoral Artery   Left Ventriculography  Abdominal Aortic Angiography with Bilateral Iliac Run off  PROCEDURE: The patient was brought to the [facility]nd [facility] Catheter[facility] Lab urgently.  He was prepped and draped in the usual sterile fashion for right or left femoral artery access.  Sterile technique was used including antiseptics, cap, gloves, gown, hand hygiene, mask and sheet. Skin prep: Chlorhexidine.   Consent: Risks of procedure as well as the alternatives and risks of each were explained to the (patient/caregiver). Consent for procedure obtained.   Time Out: Verified patient identification, verified procedure, site/side was marked, verified correct patient position, special equipment/implants available, medications/allergies/relevent  history reviewed, required imaging and test results available.  Performed.  Access:   After multiple times or may both with the standard access needle and micropuncture needle to obtain fluoroscopically guided access of the right common femoral artery, the decision was made to switch to left common femoral as the wire would not advance in the standard direction suggestive of possible solution.  Left Common Femoral Artery: 6 Fr Sheath -   Fluoroscopically & Direct Ultrasound Guided modified Seldinger Technique  Left Heart Catheterization: 6Fr Catheters advanced or exchanged over a standard J-wire; JR4 catheter advanced first.  Left Coronary Artery Cineangiography: JR4 Catheter  Right Coronary Artery Cineangiography: JL4 Catheter   LV Hemodynamics; LV Gram - hand-injection, Abdominal Aortic Angiogram and Bifurcation Iliac Angiogram: Angled Pigtail  Sheath sutured into place to be used for Arterial Pressure monitoring & for potential re-access given the difficulty with access).   FINDINGS:  Hemodynamics:   Central Aortic Pressure / Mean: 120/82/99 mmHg  Left Ventricular Pressure / LVEDP: 115/29/45 mmHg  Left Ventriculography:  EF: 10 %  Wall Motion: Severe Global Hypokinesis  Coronary Anatomy:  Dominance: Right  Left Main: Normal caliber vessel that bifurcates into the LAD and Circumflex. Mild calcification but no real stenosis. LAD: Normal caliber vessel with mild ectatic dilation prior to an eccentric 90% stenosis followed by another ectatic segment at the takeoff of a diagonal branch the vessel then normalizes and a relatively small caliber vessel to reach down around the apex. Multiple septal perforators provide collaterals to the right posterior descending artery retrograde perfuses to what appears to be the distal occlusion of the RCA.  D1: Appears to be a small caliber vessel comes off the ectatic post stenotic segment. This is diffusely diseased but does not actually appear to be involved in the lesion itself.  Left Circumflex: Normal  caliber vessel with several small proximal marginal branches with a 40% stenosis prior to the branch point of the first major lateral OM2 in the AV groove branch. The AV groove vessel terminates as a small caliber posterolateral branch. There are collaterals to the right posterolateral system.  OM1: Very small caliber diffusely diseased vessel prior to the 40% mid circumflex lesion  OM 2: Moderate caliber vessel that bifurcates proximally into [place] but not to be OM 2 and OM 3. Minimal irregularities.   RCA: Percent aorto ostial occlusion with minimal flow in the SA nodal artery.  The distal vessel prior to the bifurcation of the PDA and PL has a ectatic segment with 100% occlusion followed by another ectatic segment at the bifurcation.  RPDA: Fills via retrograde flow in the apical LAD and septal perforator collaterals  RPL Sysytem:The RPAV filled via the circumflex 2 by mouth collaterals as well as retrograde flow up the PDA.  Abdominal Aortic and Iliac Angiogram:  The abdominal aorta below the celiac begins as a relatively normal caliber vessel and tapers distally to the bifurcation where the right common iliac artery is flush occluded.  The left renal artery has a 80% eccentric stenosis of the right is widely patent  The SMA appears widely patent the large arcade, however there is no IMA visualized  The celiac axis was not visualized.  With the catheter pulled back to the expected aortic bifurcation site a second angiogram was performed to visualize the iliac arteries.  The right common iliac artery was 100% flush occluded as previously described. There is collateral flow via the left pudendal artery into the right internal iliac artery. At the  completion of the angiogram there was no reconstitution seen in the right femoral arterial system.  The left common iliac artery begins as a relatively normal caliber vessel that tapers to a nearly 90% stenosis just at the branch point of the  internal and external iliac artery. The vessel then continues as the the external iliac is a small-caliber vessel over the femoral head. Used to diseased the entire segment is 60-70% stenosed from the comparison vessel of the proximal common iliac.  The severity of disease in the left iliac system would preclude the placement of an internal aortic balloon pump for an Impella for mechanical support   After reviewing the initial angiography, the culprit lesion was thought to be 90% proximal LAD lesion.  Based on the patient's severe cardiomyopathy with severely elevated LVEDP, it was felt to be too high risk to proceed with ad hoc PCI of this lesion without the potential for mechanical support in the acute setting.  MEDICATIONS:  Anesthesia:  Local Lidocaine 14 ml both groins  Sedation:  He came over on fentanyl and Versed drips  Omnipaque Contrast: 110 ml  Anticoagulation:  IV Heparin 4000 Units given post procedure  Anti-Platelet Agent:  He is on aspirin Plavix, with concern of recent GI bleeding and oozing from the left IJ central line, Aggrastat was not started  PATIENT DISPOSITION:    The patient was transferred to the PACU holding area in a hemodynamicaly stable, chest pain free condition.  The patient tolerated the procedure well, and there were no complications.  EBL:   < 10 ml  The patient was stable before, during, and after the procedure.  POST-OPERATIVE DIAGNOSIS:    Severe 2 vessel CAD with 100% occluded RCA filled via collaterals and the culprit lesion being the proximal LAD 90% stenosis with pre-and post stenosis ectasia.  Extremely severe ischemic cardiomyopathy with reduction in EF from recent echocardiogram showing 55% down to any of the 5-10% with severe global hypokinesis.  Severely elevated LVEDP of 45 mmHg  Known severe COPD making very poor CABG candidate  PLAN OF CARE: After reviewing the images with Dr. Fransico Him, we called the on-call CT surgeon, Dr.  Cyndia Bent to discuss the patient's case. Based on his history of severe COPD and extremely poor arterial access, he did not feel comfortable with the potential for subclavian access 5 L Impella. He also did not feel that the patient would survive CABG based on the extent of his COPD. At this point the decision was made to terminate the procedure sutured line in place for potential re-access if needed. As he was currently hemodynamically stable off of vasopressors and not apparently in cardiogenic shock, the decision was made not to proceed with acute PCI on the proximal LAD at this time.   Transferred to CCU -  PCCM to assist with vent management and sedation.  Initiate IV heparin drip after bolus given in the Cath Lab. Continue aspirin plus Plavix.  Maintain mean arterial pressures between 60-65 mmHg with for systolic blood pressures greater than 90.  I had a long discussion with the patient's daughter. Based on the severity of his CAD and now cardiomyopathy compounded by his COPD, he is highly unlikely to survive cardiac arrest it were to occur. He is currently limited code as he is intubated and on pressors. Pressors and antiarrhythmics are acceptable, but the daughter agrees that chest compressions and defibrillation would be against the patient's wishes.  The plan will be to review the films  with interventional colleagues in the morning to determine feasibility and safety of proceeding with unsupported proximal LAD PCI in a patient with severe cardiomyopathy and severely elevated EDP  Will maintain serial measurements of CVP  Gentle diuresis overnight to avoid hypotension   Arabella Revelle W, M.D., M.S. [facility] [address] [place], [place]  [address]  [phone]  08/16/2014 1:09 AM

## 2014-08-16 NOTE — Progress Notes (Signed)
Note/chart reviewed. Agree with note.   Vishaal Strollo RD, LDN, CNSC [phone] Pager [phone] After Hours Pager   

## 2014-08-16 NOTE — H&P (View-Only) (Signed)
Patient ID:  Douglas Mann, male   DOB: 05/05/1946, 68 y.o.   MRN: [id number]  68 year old gentleman long-standing COPD, previously known CAD (unsure of details), PhD as well as hypertension and hyperlipidemia admitted to [facility] with GI bleeding and abdominal pain/postprandial epigastric pain he.  He also noted intermittent chest pain. On the evening of 8/27, he had an episode of severe substernal chest pain 10 out of 10 with acute onset dyspnea and diaphoresis. He was found to be hypoxic and in flash pulmonary edema on chest x-ray. He did not note improvement after nitroglycerin. He received 2 doses of IV Lasix 40 mg. During this episode he had 2 runs of sustained ventricular tachycardia. He never received CPR in case and converted. He was given amiodarone bolus. Troponin levels were 1.62 an EKG showed diffuse ST depressions with sinus tachycardia.  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  Cardiac catheterization revealed an occluded RCA, 90% proximal circumflex, severely reduced ejection fraction of 5-10% with LVEDP of 45 mmHg. Also severe PAD with occluded right common iliac and severe stenosis of the left common iliac making mechanical support impossible.  He was transferred to the CCU and started on Levothroid keep pressures with map greater than 43-32% and systolic pressures roughly 90 or greater.  Subjective:  Events of last PM & early AM noted (I was present)  Objective:  Vital Signs in the last 24 hours: Temp:  [96.3 F (35.7 C)-98.9 F (37.2 C)] 96.4 F (35.8 C) (08/28 0145) Pulse Rate:  [71-122] 99 (08/28 0040) Resp:  [13-20] 20 (08/28 0145) BP: (56-178)/(37-122) 92/61 mmHg (08/28 0145) SpO2:  [80 %-100 %] 100 % (08/28 0040) Arterial Line BP: (89-102)/(62-72) 89/62 mmHg (08/28 0145) FiO2 (%):  [40 %-100 %] 40 % (08/28 0147)  Intake/Output from  previous day: 08/27 0701 - 08/28 0700 In: 1352.4 [P.O.:240; I.V.:1112.4] Out: 1300 [Urine:1300] Intake/Output from this shift: Total I/O In: 212.4 [I.V.:212.4] Out: -   Physical Exam: General appearance: ~sedated, but arousable. Neck: no adenopathy, no carotid bruit and L IJ line with mild heme; + JVD Lungs: coarse rales /rhonchi bilateral Heart: tachy with RR, no M/R + S4 Abdomen: soft, non-tender; bowel sounds normal; no masses,  no organomegaly Extremities: extremities normal, atraumatic, no cyanosis or edema and cold Pulses: faint, Neurologic: Mental status: sedated  Lab Results:  Recent Labs  08/15/14 0400 08/15/14 1957  WBC 11.7* 15.1*  HGB 9.6* 11.1*  PLT 148* 223    Recent Labs  08/15/14 0400 08/15/14 1957  NA 136* 136*  K 4.7 4.3  CL 101 98  CO2 24 21  GLUCOSE 113* 254*  BUN 8 10  CREATININE 1.03 1.18    Recent Labs  08/14/14 0220 08/15/14 1957  TROPONINI <0.30 1.62*   Hepatic Function Panel  Recent Labs  08/15/14 1957  PROT 7.5  ALBUMIN 3.5  AST 26  ALT 11  ALKPHOS 83  BILITOT 0.5   No results found for this basename: CHOL,  in the last 72 hours No results found for this basename: PROTIME,  in the last 72 hours  Imaging: Reviewed in Epic  Cardiac Studies: See Report summarized above  EKG this AM - S Tachy with Inf Q waves c/w prior Inf MI ( -- correlates with RCA CTO)  Assessment/Plan:  Principal Problem:   MI, acute, non ST segment elevation Active Problems:   Peripheral arterial occlusive disease:  100% occluded right common iliac; focal 90 and diffuse 60-70% left common and external iliac   COPD, 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 EF 5 -10% by LV gram - following non-STEMI and ventricular tachycardia   HYPERLIPIDEMIA   GI bleed   HYPERTENSION   Acute pulmonary edema  Echo done this AM - prelim look - EF ~15% severe anterior HK to AK, mod-severe Inf-Post HK.    After reviewing films with Dr. Burt Knack this AM - he agrees that the LAD lesion is critical & has an acute unstable appearance.  He agrees that for the patient to have a chance of successful extubation & continued diuresis as well as potential for partial LV recovery, this will need to be addressed.   Mechanical support may be possible via IABP (is sheath is able to be advanced) vs. Potential 5L Impella done in hybrid OR.  Will d/w Adv. HF service MD & Dr. Prescott Gum re: this possibility.  ? Benefit of RHC.  Will continue Heparin IV, ASA & Plavix.  If IABP use is possible, I have quoted a ~20% mortality (spoke with daughter) with attempted PCI, but notably higher if it is not done.  He remains no CPR/Shock, but ok with pressors.  Continue IV Amiodarone give VT last PM.  Follow Troponin trend.  (ProBNP was elevated - & may well be higher)    For now - continue Levophed for pressure support & to allow for diuresis. ? Potential of adding nitrate for afterload reduction.  Hypoglycemic this AM - will use D10 for KVO fluid.  ON SSI - but no coverage given.  On Statin  As for GI issues -- once more stable hemodynamically & potentially no longer ischemic, would consider CTA Abdomen to look for Celiac Artery disease for potential of mesenteric ischemia.  SMA actually looked relatively normal.  Will defer Vent/Sedation & COPD Rx to PCCM -- convert to steroid nebulizer & DuoNeb.   The patient is critically ill with multisystem organ failure requiring extensive monitoring & medication management.    Critical Care Time ~45 min to 1 hr total    LOS: 3 days    HARDING,DAVID  W 08/16/2014, 2:37 AM

## 2014-08-16 NOTE — Progress Notes (Signed)
Angiomax infusion finished at this time, will begin to deflate TR band  in 2 hrs, no complications at this time. Patient is due to have lasix 40mg  IV, called on-call MD-dr. Oleta Mouse to confirm that lasix is to be administered after cath-OK to administer.

## 2014-08-16 NOTE — Progress Notes (Signed)
ANTICOAGULATION CONSULT NOTE - Initial Consult  Pharmacy Consult for heparin Indication: NSTEMI with severe multi-vessel CAD  No Known Allergies  Patient Measurements: Height: 5\' 7"  (170.2 cm) Weight: 163 lb 4.8 oz (74.072 kg) IBW/kg (Calculated) : 66.1  Vital Signs: Temp: 98.9 F (37.2 C) (08/27 2110) Temp src: Oral (08/27 2110) BP: 177/122 mmHg (08/27 2205) Pulse Rate: 99 (08/28 0040)  Labs:  Recent Labs  08/13/14 1620 08/13/14 2049 08/14/14 0220 08/14/14 1356 08/15/14 0400 08/15/14 1957  HGB 11.5*  --  9.3* 9.8* 9.6* 11.1*  HCT 35.0*  --  28.6* 29.8* 29.2* 33.7*  PLT 185  --  150  --  148* 223  LABPROT 12.9  --   --   --   --   --   INR 0.97  --   --   --   --   --   CREATININE 1.14  --  1.03  --  1.03 1.18  TROPONINI <0.30 <0.30 <0.30  --   --  1.62*    Estimated Creatinine Clearance: 54.5 ml/min (by C-G formula based on Cr of 1.18).   Medical History: Past Medical History  Diagnosis Date  . HYPERLIPIDEMIA 10/01/2009  . HYPERTENSION 10/01/2009  . CAD 10/01/2009    Stent at [place] greater than 10 years ago  . PVD 10/01/2009  . ALLERGIC RHINITIS 12/24/2009  . COPD 10/01/2009  . OSTEOARTHRITIS, GENERALIZED, MULTIPLE JOINTS 12/24/2009    Medications:  Prescriptions prior to admission  Medication Sig Dispense Refill  . albuterol (VENTOLIN HFA) 108 (90 BASE) MCG/ACT inhaler Inhale 2 puffs into the lungs every 6 (six) hours as needed.  3 Inhaler  3  . aspirin 81 MG tablet Take 81 mg by mouth daily.        Marland Kitchen atenolol (TENORMIN) 50 MG tablet take 1 tablet by mouth twice a day  180 tablet  2  . clopidogrel (PLAVIX) 75 MG tablet take 1 tablet by mouth once daily  90 tablet  2  . fish oil-omega-3 fatty acids 1000 MG capsule Take 2 g by mouth daily.        . Fluticasone-Salmeterol (ADVAIR) 500-50 MCG/DOSE AEPB Inhale 1 puff into the lungs 2 (two) times daily.      Marland Kitchen lisinopril (PRINIVIL,ZESTRIL) 20 MG tablet take 1 tablet by mouth once daily  90 tablet  2  . NON  FORMULARY O2 per nasal canula continuous @@ 2 liters      . prazosin (MINIPRESS) 1 MG capsule take 1 capsule by mouth twice a day  180 capsule  2  . PROCTOSOL HC 2.5 % rectal cream apply rectally twice a day  28.35 g  0  . simvastatin (ZOCOR) 80 MG tablet take one half tablet daily      . triamcinolone cream (KENALOG) 0.1 % Apply topically 2 (two) times daily.  30 g  5   Scheduled:  . antiseptic oral rinse  7 mL Mouth Rinse BID  . atorvastatin  40 mg Per Tube q1800  . capsaicin   Topical BID  . clopidogrel  75 mg Per Tube Daily  . docusate sodium  100 mg Oral BID  . fentaNYL      . fentaNYL  50 mcg Intravenous Once  . folic acid  1 mg Per Tube Daily  . furosemide      . furosemide  20 mg Intravenous Q12H  . heparin      . hydrocortisone   Rectal BID  . insulin aspart  2-6  Units Subcutaneous 6 times per day  . ipratropium-albuterol  3 mL Nebulization Q4H  . magnesium sulfate 1 - 4 g bolus IVPB  2 g Intravenous Once  . mometasone-formoterol  2 puff Inhalation BID  . pantoprazole (PROTONIX) IV  40 mg Intravenous Q12H  . sodium chloride  3 mL Intravenous Q12H  . sodium chloride  3 mL Intravenous Q12H  . sucralfate  1 g Oral TID WC & HS  . thiamine  100 mg Per Tube Daily  . THROMBI-PAD  1 each Topical Once  . triamcinolone cream   Topical BID   Infusions:  . sodium chloride    . amiodarone    . amiodarone    . fentaNYL infusion INTRAVENOUS 50 mcg/hr (08/15/14 2200)  . norepinephrine (LEVOPHED) Adult infusion      Assessment: 68yo male was admitted to [facility] for GIB, he was eventually intubated for acute respiratory distress that was related to acute pulmonary edema, also w/ Vtach x2, after tx'ing to [facility] ICU troponin was elevated, taken emergently to [facility] cath lab, now s/p cath w/ findings of severe multi-vessel CAD, unable to place IABP at this time, to begin heparin; of note Hgb has increased but will proceed cautiously given recent GIB (pt states this has been chronic).  Goal of  Therapy:  Heparin level 0.3-0.5 units/ml Monitor platelets by anticoagulation protocol: Yes   Plan:  Will begin heparin gtt at 750 units/hr and monitor heparin levels and CBC.  Wynona Neat, PharmD, BCPS  08/16/2014,1:14 AM

## 2014-08-16 NOTE — Progress Notes (Signed)
NUTRITION FOLLOW-UP  INTERVENTION:  Initiate Vital AF 1.2 @ 25 ml/hr via OGT and increase by 10 ml every 4 hours to goal rate of 55 ml/hr.    Prostat 30 ml BID.   Tube feeding regimen provides 1784  kcal (103% of needs), 129 grams of protein, and 1070 ml of H2O.    Will continue to monitor  NUTRITION DIAGNOSIS: Inadequate oral intake now related to inability to eat as evidenced by NPO status.  Goal: Patient to meet >90% estimated needs with meals and supplements.  Monitor:  TF initiation and tolerance, respiratory status, labs, weight trends   ASSESSMENT: Patient presented with chest pain and lower GI bleed. Pt has severe COPD.  Reports that his appetite is poor, hasn't been able to taste anything for the past 2-3 years since his last MI.   Patient is currently intubated on ventilator support d/t urgent non-STEMI called, pt not a candidate for CABG. Code status being discussed with daughter. MV: 14.1L/min Temp (24hrs), Avg:97.7 F (36.5 C), Min:96.3 F (35.7 C), Max:101.3 F (38.5 C)     Height: Ht Readings from Last 1 Encounters:  08/15/14 5\' 7"  (1.702 m)    Weight: Wt Readings from Last 1 Encounters:  08/13/14 163 lb 4.8 oz (74.072 kg)    BMI:  Body mass index is 25.57 kg/(m^2).  Estimated Nutritional Needs: Kcal: 1724 Protein: 120-130g Fluid: >1.7L/day  Skin: intact  Diet Order: NPO   Intake/Output Summary (Last 24 hours) at 08/16/14 1026 Last data filed at 08/16/14 1000  Gross per 24 hour  Intake 1923.68 ml  Output   1560 ml  Net 363.68 ml    Last BM: 8/25  Labs:   Recent Labs Lab 08/15/14 0400 08/15/14 1957 08/15/14 2144 08/16/14 0400  NA 136* 136*  --  135*  K 4.7 4.3  --  4.4  CL 101 98  --  99  CO2 24 21  --  19  BUN 8 10  --  13  CREATININE 1.03 1.18  --  1.24  CALCIUM 9.0 9.1  --  8.1*  MG  --   --  1.9 4.9*  PHOS  --   --  4.8* 3.9  GLUCOSE 113* 254*  --  188*    CBG (last 3)   Recent Labs  08/16/14 0222  08/16/14 0422 08/16/14 0929  GLUCAP 209* 165* 62*    Scheduled Meds: . antiseptic oral rinse  7 mL Mouth Rinse QID  . aspirin  81 mg Oral Daily  . atorvastatin  40 mg Per Tube q1800  . budesonide (PULMICORT) nebulizer solution  0.25 mg Nebulization BID  . capsaicin   Topical BID  . chlorhexidine  15 mL Mouth Rinse BID  . clopidogrel  75 mg Per Tube Daily  . fentaNYL  50 mcg Intravenous Once  . folic acid  1 mg Per Tube Daily  . furosemide  40 mg Intravenous Q12H  . hydrocortisone   Rectal BID  . insulin aspart  2-6 Units Subcutaneous 6 times per day  . ipratropium-albuterol  3 mL Nebulization Q4H  . pantoprazole (PROTONIX) IV  40 mg Intravenous Q12H  . sodium chloride  3 mL Intravenous Q12H  . sodium chloride  3 mL Intravenous Q12H  . sucralfate  1 g Oral TID WC & HS  . thiamine  100 mg Per Tube Daily  . triamcinolone cream   Topical BID    Continuous Infusions: . amiodarone 30 mg/hr (08/16/14 0700)  . dextrose  20 mL/hr at 08/16/14 0952  . fentaNYL infusion INTRAVENOUS Stopped (08/16/14 0815)  . heparin 750 Units/hr (08/16/14 0700)  . norepinephrine (LEVOPHED) Adult infusion 12 mcg/min (08/16/14 0815)    Clayton Bibles, [place], [place] Provisionally Licensed Dietitian Nutritionist Pager: [phone]

## 2014-08-16 NOTE — Progress Notes (Signed)
  Echocardiogram 2D Echocardiogram has been performed.  Douglas Mann 08/16/2014, 9:33 AM

## 2014-08-16 NOTE — Progress Notes (Signed)
Event:   Notified by RN at approx 1925 that pt w/ increased WOB, 02 sats in the high 70's on 2L [place], diaphoresis and c/o CP 8-10/10. 12-lead EKG obtained and revealed ST w/ rate of 120's and ST changes. Pt had been in NSR.  Assessed by RRT and noted to have bil wheezes and crackles. Pt received PRN neb and WOB minimally improved w/ persistent c/o CP. Pt had not received any relief of CP w/ SL NTG x 3. Orders given for Lasix 40 mg IV, Morphine 2 mg IV and stat PCXR. Orders placed by RR RN for CBC, C-MET, Troponin and BNP. NP to bedside. Subjective: Pt reported sudden onset of CP and inability to catch his breath. He reported that the CP was pressure-like in nature, non-radiating and similar to CP he has had in the past w/ his previous "heart attacks". Pt reported CP improved somewhat w/ IV Morphine but was beginning to return.  Objective: Douglas Mann is a 68 y/o gentleman w/ past medical h/o MI x 4 and COPD (continues to smoke) who was admitted 08/13/14 after presenting to ED w/ c/o lower GI bleeding symptoms. He stated that he had noted BRBPR and this has actually been going on for 2 years. Patient was being prepared for d/c home when he started c/o non-radiating CP. At that time he described pain that was in his lower chest/upper abdomen. He denied N/V, chills or fever. There had been no syncope noted. His CE remained negative and cardiology saw him today and felt the pain was not cardiac in nature and did not recommend ischemic work-up at this time. At bedside pt noted diaphoretic, pale w/ increased WOB including significant accessory muscle use. He is awake and alert and can answer simple questions. EKG reveals ST w/ rate 128 w/ diffuse ST depressions. Cardiology paged. BBS very diminished w/ faint crackles at bil bases and expiratory wheezes.  Xopenex neb given after which pt appeared to have some improvement in WOB, sats now 95% on NRB on 15L. PCXR reveals moderate pulmonary edema. Lasix 40 mg IV repeated. As I  was speaking w/ Dr Radford Pax regarding pt's change in status pt went into sustained V-Tach but did not loose pulse or have LOC. A code was called. Dr Hal Hope notified and responded to bedside.  Pt spontaneously converted to ST after approx 2 minutes. Dr Wilson Singer (EDP) and anesthesia responded to bedside. Pt converted back to sustained V-Tach in 180's and became more SOB and diaphoretic. Amiodarone bolus given after which pt converted back to ST. Discussed pt w/ Dr Stevenson Clinch via [place] Lacy who agreed to have pt evaluated by CCM provider. Troponin returned elevated at 1.62. BNP 2319.0. Pt transferred to ICU where he was immediately intubated. Assessment/Plan: 1. Acute respiratory failure in setting of pulmonary edema: Pt is intubated in ICU. Jaclynn Guarneri, NP at bedside in ICU. Preparing pt for CVL placement.  2. NSTEMI w/ V-Tach: No shock. Dr Radford Pax at bedside in ICU and plans for transfer to [facility] for cath. Appreciate cardiology and CCM input. CCM primary service while in intubated. RN has notified family of pt's change in status.   Douglas [place], NP-C [facility] Pager [phone]

## 2014-08-16 NOTE — Consult Note (Signed)
Referring Provider: No ref. provider found Primary Care Physician:  [name], MD Primary Gastroenterologist:  None, unassigned  Reason for Consultation:  Rectal bleeding; chest pain/odynophagia  HPI: Douglas Mann is a 68 y.o. male who presented to [facility] on 8/25 with complaints of rectal bleeding.  The bleeding was reportedly bright red blood occurring with brown stool and had actually been occuring for 2 years.  It was thought to be most likely hemorrhoidal and he was going to be discharged from the hospital when he started complaining of chest pain.  He has a history of CAD with CABG, but pain was thought to be atypical and possibly GI related, which is why GI was consulted initially.  On August 18, 2023 he went into [facility] and coded.  Was brought to [facility] and taken to cath lab emergently.  Cardiac cath revealed an occluded RCA, 90% proximal circumflex, severely reduced EF of 5-10%.  He also has severe PAD with occluded right common iliac and severe stenosis of the left common iliac.  He has been intubated and is on pressors.  GI is now being asked to see him regarding the rectal bleeding and complaints of post-prandial abdominal pain and distention.  There is apparently concern for mesenteric ischemia to account for his abdominal complaints.  I spoke with his daughter who reports that he has only been able to eat small amounts at a time without getting abdominal pain and distention for the past 2-3 years.  The rectal bleeding has not been occuring quite as long.  She is very reasonable and understands that his heart needs to be better before other studies can be performed.  CT abdomen and pelvis with contrast showed not acute findings with only moderate diverticulosis throughout the colon and mild cholelithiasis.  The rectal bleeding was thought to be hemorrhoidal or diverticular.  Patient returning to cath lab later this afternoon.  Past Medical History  Diagnosis Date  . HYPERLIPIDEMIA  10/01/2009  . HYPERTENSION 10/01/2009  . CAD 10/01/2009    Stent at [place] greater than 10 years ago  . PVD 10/01/2009  . ALLERGIC RHINITIS 12/24/2009  . COPD 10/01/2009  . OSTEOARTHRITIS, GENERALIZED, MULTIPLE JOINTS 12/24/2009    Past Surgical History  Procedure Laterality Date  . Fracture surgery  2012    ORIF r tibia fracture  . Tonsillectomy and adenoidectomy      Prior to Admission medications   Medication Sig Start Date End Date Taking? Authorizing Provider  albuterol (VENTOLIN HFA) 108 (90 BASE) MCG/ACT inhaler Inhale 2 puffs into the lungs every 6 (six) hours as needed. 09/17/11  Yes [name], MD  aspirin 81 MG tablet Take 81 mg by mouth daily.     Yes Historical Provider, MD  atenolol (TENORMIN) 50 MG tablet take 1 tablet by mouth twice a day 12/24/13  Yes [name], MD  clopidogrel (PLAVIX) 75 MG tablet take 1 tablet by mouth once daily 12/24/13  Yes [name], MD  fish oil-omega-3 fatty acids 1000 MG capsule Take 2 g by mouth daily.     Yes Historical Provider, MD  Fluticasone-Salmeterol (ADVAIR) 500-50 MCG/DOSE AEPB Inhale 1 puff into the lungs 2 (two) times daily.   Yes Historical Provider, MD  lisinopril (PRINIVIL,ZESTRIL) 20 MG tablet take 1 tablet by mouth once daily 12/24/13  Yes [name], MD  NON FORMULARY O2 per nasal canula continuous @@ 2 liters   Yes Historical Provider, MD  prazosin (MINIPRESS) 1 MG capsule take 1  capsule by mouth twice a day 12/24/13  Yes [name], MD  PROCTOSOL HC 2.5 % rectal cream apply rectally twice a day 03/27/12  Yes [name], MD  simvastatin (ZOCOR) 80 MG tablet take one half tablet daily 06/20/14  Yes [name], MD  triamcinolone cream (KENALOG) 0.1 % Apply topically 2 (two) times daily. 08/07/14  Yes [name], MD    Current Facility-Administered Medications  Medication Dose Route Frequency Provider Last Rate Last Dose  . 0.9 %  sodium chloride infusion  250 mL Intravenous PRN Allyne Gee, MD      . 0.9 %  sodium chloride infusion   Intravenous Continuous Leonie Man, MD 10 mL/hr at 08/16/14 0815    . amiodarone (NEXTERONE PREMIX) 360 MG/200ML (1.8 mg/mL) IV infusion  30 mg/hr Intravenous Continuous Leonie Man, MD 16.7 mL/hr at 08/16/14 0700 30 mg/hr at 08/16/14 0700  . antiseptic oral rinse (CPC / CETYLPYRIDINIUM CHLORIDE 0.05%) solution 7 mL  7 mL Mouth Rinse QID Doree Fudge, MD   7 mL at 08/16/14 0340  . atorvastatin (LIPITOR) tablet 40 mg  40 mg Per Tube q1800 Leonie Man, MD      . bisacodyl (DULCOLAX) EC tablet 5 mg  5 mg Oral Daily PRN Brand Males, MD       Or  . bisacodyl (DULCOLAX) suppository 10 mg  10 mg Rectal Daily PRN Brand Males, MD      . capsaicin (ZOSTRIX) 0.025 % cream   Topical BID Janece Canterbury, MD      . chlorhexidine (PERIDEX) 0.12 % solution 15 mL  15 mL Mouth Rinse BID Doree Fudge, MD      . clopidogrel (PLAVIX) tablet 75 mg  75 mg Per Tube Daily Leonie Man, MD      . docusate (COLACE) 50 MG/5ML liquid 100 mg  100 mg Oral BID PRN Brand Males, MD       Or  . docusate (COLACE) 50 MG/5ML liquid 100 mg  100 mg Per Tube BID PRN Brand Males, MD      . fentaNYL (SUBLIMAZE) 0.05 MG/ML injection           . fentaNYL (SUBLIMAZE) 2,500 mcg in sodium chloride 0.9 % 250 mL (10 mcg/mL) infusion  0-400 mcg/hr Intravenous Continuous Brand Males, MD   35 mcg/hr at 08/16/14 0700  . fentaNYL (SUBLIMAZE) bolus via infusion 25-50 mcg  25-50 mcg Intravenous Q1H PRN Brand Males, MD      . fentaNYL (SUBLIMAZE) injection 50 mcg  50 mcg Intravenous Once Brand Males, MD      . folic acid (FOLVITE) tablet 1 mg  1 mg Per Tube Daily Leonie Man, MD      . furosemide (LASIX) injection 20 mg  20 mg Intravenous Q12H Leonie Man, MD      . heparin ADULT infusion 100 units/mL (25000 units/250 mL)  750 Units/hr Intravenous Continuous Rogue Bussing, RPH 7.5 mL/hr at 08/16/14 0700 750  Units/hr at 08/16/14 0700  . hydrocortisone (ANUSOL-HC) 2.5 % rectal cream   Rectal BID Allyne Gee, MD      . insulin aspart (novoLOG) injection 2-6 Units  2-6 Units Subcutaneous 6 times per day Brand Males, MD   4 Units at 08/16/14 0430  . ipratropium-albuterol (DUONEB) 0.5-2.5 (3) MG/3ML nebulizer solution 3 mL  3 mL Nebulization Q4H Brand Males, MD   3 mL at 08/16/14 0758  . midazolam (VERSED) injection  1 mg  1 mg Intravenous Q2H PRN Corey Harold, NP      . mometasone-formoterol (DULERA) 200-5 MCG/ACT inhaler 2 puff  2 puff Inhalation BID Allyne Gee, MD   2 puff at 08/15/14 [phone]  . norepinephrine (LEVOPHED) 16 mg in dextrose 5 % 250 mL infusion  2-50 mcg/min Intravenous Continuous Leonie Man, MD 11.3 mL/hr at 08/16/14 0815 12 mcg/min at 08/16/14 0815  . pantoprazole (PROTONIX) injection 40 mg  40 mg Intravenous Q12H Brand Males, MD      . sodium chloride 0.9 % injection 3 mL  3 mL Intravenous Q12H Allyne Gee, MD   3 mL at 08/14/14 2207  . sodium chloride 0.9 % injection 3 mL  3 mL Intravenous Q12H Allyne Gee, MD      . sodium chloride 0.9 % injection 3 mL  3 mL Intravenous PRN Allyne Gee, MD      . sucralfate (CARAFATE) 1 GM/10ML suspension 1 g  1 g Oral TID WC & HS Janece Canterbury, MD   1 g at 08/14/14 2206  . thiamine (VITAMIN B-1) tablet 100 mg  100 mg Per Tube Daily Leonie Man, MD      . triamcinolone cream (KENALOG) 0.1 %   Topical BID Allyne Gee, MD        Allergies as of 08/13/2014  . (No Known Allergies)    Family History  Problem Relation Age of Onset  . CAD Father     Died age 75 MI    History   Social History  . Marital Status: Divorced    Spouse Name: N/A    Number of Children: 2  . Years of Education: N/A   Occupational History  . Retired    Social History Main Topics  . Smoking status: Current Some Day Smoker -- 1.50 packs/day for 50 years    Types: Cigarettes  . Smokeless tobacco: Never Used     Comment: Smokes  rarely now  . Alcohol Use: No  . Drug Use: No  . Sexual Activity: Not on file   Other Topics Concern  . Not on file   Social History Narrative   Lives alone.      Review of Systems: Unable to be obtained due to patient being intubated.  Physical Exam: Vital signs in last 24 hours: Temp:  [96.3 F (35.7 C)-99.5 F (37.5 C)] 99.5 F (37.5 C) (08/28 0800) Pulse Rate:  [78-122] 94 (08/28 0758) Resp:  [13-30] 30 (08/28 0800) BP: (56-178)/(37-122) 117/84 mmHg (08/28 0800) SpO2:  [80 %-100 %] 100 % (08/28 0758) Arterial Line BP: (87-112)/(62-72) 112/72 mmHg (08/28 0800) FiO2 (%):  [30 %-100 %] 30 % (08/28 0800) Last BM Date: 08/13/14 General:  Alert, critically ill-appearing, cooperative in NAD Head:  Normocephalic and atraumatic. Eyes:  Sclera clear, no icterus.  Conjunctiva pink. Ears:  Normal auditory acuity. Mouth:  No deformity or lesions.  ET tube in place. Lungs:  Coarse BS noted. Heart:  Regular rate and rhythm; no murmurs Abdomen:  Soft, non-distended.  BS present but very quiet.  Non-tender.   Rectal:  Deferred  Msk:  Symmetrical without gross deformities. Pulses:  LE pulses faint. Extremities:  Without clubbing or edema, but cold. Neurologic:  Alert.  No neurologic deficits. Skin:  Intact without significant lesions or rashes.  Intake/Output from previous day: 08/27 0701 - 08/28 0700 In: 2046.9 [P.O.:240; I.V.:1756.9; IV Piggyback:50] Out: 4270 [Urine:1550] Intake/Output this shift: Total I/O In: 116.8 [  I.V.:116.8] Out: 60 [Urine:60]  Lab Results:  Recent Labs  08/15/14 0400 08/15/14 1957 08/16/14 0400  WBC 11.7* 15.1* 16.6*  HGB 9.6* 11.1* 10.0*  HCT 29.2* 33.7* 30.1*  PLT 148* 223 195   BMET  Recent Labs  08/15/14 0400 08/15/14 1957 08/16/14 0400  NA 136* 136* 135*  K 4.7 4.3 4.4  CL 101 98 99  CO2 24 21 19   GLUCOSE 113* 254* 188*  BUN 8 10 13   CREATININE 1.03 1.18 1.24  CALCIUM 9.0 9.1 8.1*   LFT  Recent Labs  08/15/14 1957   PROT 7.5  ALBUMIN 3.5  AST 26  ALT 11  ALKPHOS 83  BILITOT 0.5   PT/INR  Recent Labs  08/13/14 1620  LABPROT 12.9  INR 0.97   Studies/Results: Dg Chest 1 View  08/15/2014   CLINICAL DATA:  rapid response, sob, tachycardia  EXAM: CHEST - 1 VIEW  COMPARISON:  08/15/2014 at 0515 hr  FINDINGS: Interval development of moderate pulmonary edema with perihilar and basilar alveolar edema. Cardiopericardial silhouette partially obscured but grossly unchanged compared to prior. No pleural effusion. No pneumothorax. Old RIGHT distal clavicle fracture.  IMPRESSION: Interval development of moderate bilateral airspace disease consistent with pulmonary edema.   Electronically Signed   By: Dereck Ligas M.D.   On: 08/15/2014 19:58   Dg Chest Port 1 View  08/16/2014   CLINICAL DATA:  Evaluate endotracheal tube  EXAM: PORTABLE CHEST - 1 VIEW  COMPARISON:  08/15/2014  FINDINGS: Endotracheal tube tip about 5.8 cm above the carina. Left central line in unchanged position. NG tube again crosses the gastroesophageal junction. Extensive infiltrate right middle and lower lobe stable. No significant findings on the left.  IMPRESSION: Endotracheal tube as described.  Persistent right-sided infiltrate.   Electronically Signed   By: Skipper Cliche M.D.   On: 08/16/2014 07:37   Dg Chest Port 1 View  08/15/2014   CLINICAL DATA:  Line and tube placement.  EXAM: PORTABLE CHEST - 1 VIEW  COMPARISON:  08/15/2014.  FINDINGS: Interval left jugular catheter with its tip in the proximal superior vena cava. No pneumothorax. Interval endotracheal tube in satisfactory position. Interval nasogastric tube with its tip in the proximal stomach and side hole in the distal esophagus. Normal sized heart. No significant change in diffuse bilateral interstitial prominence and patchy airspace opacity in the right mid and lower lung zones and medial left lung base. Diffuse osteopenia.  IMPRESSION: 1. Tubes and catheter, as described above.  2. Stable changes of congestive heart failure.   Electronically Signed   By: Enrique Sack M.D.   On: 08/15/2014 22:04   Dg Chest Port 1 View  08/15/2014   CLINICAL DATA:  Chest pain  EXAM: PORTABLE CHEST - 1 VIEW  COMPARISON:  09/06/2011  FINDINGS: Small stable granuloma in the right upper lobe near the minor fissure. There is minor lung base linear/reticular opacity that is most likely atelectasis. Lungs are otherwise clear. No pleural effusion or pneumothorax.  Normal heart, mediastinum and hila.  Bony thorax is demineralized but grossly intact.  IMPRESSION: No active disease.   Electronically Signed   By: Lajean Manes M.D.   On: 08/15/2014 07:46    IMPRESSION:  -Rectal bleeding, post-prandial abdominal pain, abdominal distention:  Chronic complaints for 2 years.  Concern for possible chronic mesenteric ischemia.  -Critically ill with episodes of Vtach last night and NSTEMI requiring emergent cardiac cath.  Now with EF of 5-10% and severe vascular disease.  On  heparin gtt and pressors.  PLAN: -In his critical state the only study that we would recommend is CT angio of his abdomen and pelvis when his cardiac status is stable to do so. I spoke with his daughter regarding this plan and she is very reasonable and understands that his cardiac situation dominates currently.  -Monitor Hgb, which has been fairly stable.    ZEHR, JESSICA D.  08/16/2014, 8:50 AM  Pager number [phone]  ________________________________________________________________________  Velora Heckler GI MD note:  I personally examined the patient, reviewed the data and agree with the assessment and plan described above.  The question of significant mesenteric ischemia (chronic, acute on chronic?) is interesting.  For now his cardiac situation is very serious, unstable. Recommend CT angiogram of mesenteric vessels when he stabilizes.    Owens Loffler, MD [facility] Pager [phone]

## 2014-08-16 NOTE — Progress Notes (Signed)
Chaplain followed- up with patient who was [facility] patient who was transferred from [place] for the Cath Lab procedure, upon his arrival to ED he was taken directly to the Cath Lab.  His daughter was with him at his time of arrival and at the follow-up visit.  Informed there of the Spiritual Care Department and the availably of the Chaplains to assist her with support if needed.  Also spoke to the patient and offered encouragement.  Follow-up will be provided by the assigned Chaplain to the Unit. On-Call Chaplain Yaakov Guthrie (030 mins) [phone]

## 2014-08-16 NOTE — Progress Notes (Signed)
Hypoglycemic Event  CBG:62 Treatment: D50 IV 25 mL  Symptoms: None  Follow-up CBG: Time:1055 CBG Result:164  Possible Reasons for Event: Inadequate meal intake and Other: Pt npo, intubated  Comments/MD notified:Dr. Ellyn Hack at bedside     [name]  Remember to initiate Hypoglycemia Order Set & complete

## 2014-08-17 ENCOUNTER — Inpatient Hospital Stay (HOSPITAL_COMMUNITY): Payer: Medicare Other

## 2014-08-17 DIAGNOSIS — I200 Unstable angina: Secondary | ICD-10-CM

## 2014-08-17 DIAGNOSIS — J189 Pneumonia, unspecified organism: Secondary | ICD-10-CM

## 2014-08-17 DIAGNOSIS — I2589 Other forms of chronic ischemic heart disease: Secondary | ICD-10-CM

## 2014-08-17 DIAGNOSIS — I2510 Atherosclerotic heart disease of native coronary artery without angina pectoris: Secondary | ICD-10-CM

## 2014-08-17 DIAGNOSIS — R570 Cardiogenic shock: Secondary | ICD-10-CM

## 2014-08-17 DIAGNOSIS — I5041 Acute combined systolic (congestive) and diastolic (congestive) heart failure: Secondary | ICD-10-CM

## 2014-08-17 DIAGNOSIS — J449 Chronic obstructive pulmonary disease, unspecified: Secondary | ICD-10-CM

## 2014-08-17 LAB — GLUCOSE, CAPILLARY
GLUCOSE-CAPILLARY: 132 mg/dL — AB (ref 70–99)
GLUCOSE-CAPILLARY: 135 mg/dL — AB (ref 70–99)
GLUCOSE-CAPILLARY: 136 mg/dL — AB (ref 70–99)
GLUCOSE-CAPILLARY: 156 mg/dL — AB (ref 70–99)
Glucose-Capillary: 139 mg/dL — ABNORMAL HIGH (ref 70–99)
Glucose-Capillary: 171 mg/dL — ABNORMAL HIGH (ref 70–99)
Glucose-Capillary: 149 mg/dL — ABNORMAL HIGH (ref 70–99)

## 2014-08-17 LAB — POCT I-STAT 3, ART BLOOD GAS (G3+)
ACID-BASE DEFICIT: 2 mmol/L (ref 0.0–2.0)
ACID-BASE DEFICIT: 3 mmol/L — AB (ref 0.0–2.0)
Bicarbonate: 20.3 mEq/L (ref 20.0–24.0)
Bicarbonate: 21.1 mEq/L (ref 20.0–24.0)
O2 SAT: 96 %
O2 Saturation: 99 %
TCO2: 21 mmol/L (ref 0–100)
TCO2: 22 mmol/L (ref 0–100)
pCO2 arterial: 25.6 mmHg — ABNORMAL LOW (ref 35.0–45.0)
pCO2 arterial: 33.7 mmHg — ABNORMAL LOW (ref 35.0–45.0)
pH, Arterial: 7.508 — ABNORMAL HIGH (ref 7.350–7.450)
pH, Arterial: 7.405 (ref 7.350–7.450)
pO2, Arterial: 101 mmHg — ABNORMAL HIGH (ref 80.0–100.0)
pO2, Arterial: 80 mmHg (ref 80.0–100.0)

## 2014-08-17 LAB — CBC WITH DIFFERENTIAL/PLATELET
Basophils Absolute: 0 10*3/uL (ref 0.0–0.1)
Basophils Relative: 0 % (ref 0–1)
EOS ABS: 0 10*3/uL (ref 0.0–0.7)
Eosinophils Relative: 0 % (ref 0–5)
HEMATOCRIT: 27.2 % — AB (ref 39.0–52.0)
HEMOGLOBIN: 9.4 g/dL — AB (ref 13.0–17.0)
LYMPHS ABS: 1.6 10*3/uL (ref 0.7–4.0)
Lymphocytes Relative: 11 % — ABNORMAL LOW (ref 12–46)
MCH: 29.6 pg (ref 26.0–34.0)
MCHC: 34.6 g/dL (ref 30.0–36.0)
MCV: 85.5 fL (ref 78.0–100.0)
MONO ABS: 1.3 10*3/uL — AB (ref 0.1–1.0)
MONOS PCT: 9 % (ref 3–12)
NEUTROS PCT: 80 % — AB (ref 43–77)
Neutro Abs: 11.8 10*3/uL — ABNORMAL HIGH (ref 1.7–7.7)
Platelets: 170 10*3/uL (ref 150–400)
RBC: 3.18 MIL/uL — AB (ref 4.22–5.81)
RDW: 13.8 % (ref 11.5–15.5)
WBC: 14.8 10*3/uL — ABNORMAL HIGH (ref 4.0–10.5)

## 2014-08-17 LAB — MAGNESIUM: MAGNESIUM: 2.2 mg/dL (ref 1.5–2.5)

## 2014-08-17 LAB — BASIC METABOLIC PANEL
Anion gap: 13 (ref 5–15)
BUN: 20 mg/dL (ref 6–23)
CALCIUM: 8 mg/dL — AB (ref 8.4–10.5)
CO2: 22 meq/L (ref 19–32)
Chloride: 99 mEq/L (ref 96–112)
Creatinine, Ser: 2.12 mg/dL — ABNORMAL HIGH (ref 0.50–1.35)
GFR calc Af Amer: 35 mL/min — ABNORMAL LOW (ref >90)
GFR, EST NON AFRICAN AMERICAN: 30 mL/min — AB (ref >90)
GLUCOSE: 128 mg/dL — AB (ref 70–99)
Potassium: 3.3 mEq/L — ABNORMAL LOW (ref 3.7–5.3)
SODIUM: 134 meq/L — AB (ref 137–147)

## 2014-08-17 LAB — TROPONIN I: Troponin I: >20 ng/mL (ref <0.30)

## 2014-08-17 LAB — CARBOXYHEMOGLOBIN
Carboxyhemoglobin: 1.1 % (ref 0.5–1.5)
METHEMOGLOBIN: 0.9 % (ref 0.0–1.5)
O2 Saturation: 90.2 %
Total hemoglobin: 9.4 g/dL — ABNORMAL LOW (ref 13.5–18.0)

## 2014-08-17 LAB — HEPARIN LEVEL (UNFRACTIONATED)
Heparin Unfractionated: 0.15 IU/mL — ABNORMAL LOW (ref 0.30–0.70)
Heparin Unfractionated: 0.17 IU/mL — ABNORMAL LOW (ref 0.30–0.70)

## 2014-08-17 LAB — PHOSPHORUS: Phosphorus: 1.5 mg/dL — ABNORMAL LOW (ref 2.3–4.6)

## 2014-08-17 MED ORDER — VANCOMYCIN HCL IN DEXTROSE 750-5 MG/150ML-% IV SOLN
750.0000 mg | INTRAVENOUS | Status: DC
  Administered 2014-08-17 – 2014-08-18 (×2): 750 mg via INTRAVENOUS
  Filled 2014-08-17 (×3): qty 150

## 2014-08-17 MED ORDER — POTASSIUM PHOSPHATES 15 MMOLE/5ML IV SOLN
30.0000 mmol | Freq: Once | INTRAVENOUS | Status: AC
  Administered 2014-08-17: 30 mmol via INTRAVENOUS
  Filled 2014-08-17: qty 10

## 2014-08-17 NOTE — Progress Notes (Signed)
PULMONARY / CRITICAL CARE MEDICINE   Name: Douglas Mann MRN: [id number] DOB: [birth year]    ADMISSION DATE:  08/13/2014 CONSULTATION DATE:  08/15/2014   REFERRING MD :   [facility]  CHIEF COMPLAINT:  NSTEMI - > V tach with BP -> Acute pulm edema -> VDRF -> shock  INITIAL PRESENTATION:  68 y/o male admitted on 8/25 for lower GI bleeding and abdominal pain who developed chest pain, NSTEMI, VTACH, on 8/27.  He was intubated and went to the cath lab where he was found to have a markedly elevated LVEDP.  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, LAD 90% stenosed, severe ischemic cardiomyopathy LVEF 5-10%  SUBJECTIVE: Awake and weaning very well but remains on IABP and Levophed.  VITAL SIGNS: Temp:  [98.3 F (36.8 C)-102.6 F (39.2 C)] 98.6 F (37 C) (08/29 0745) Pulse Rate:  [69-119] 98 (08/29 0900) Resp:  [0-31] 15 (08/29 0900) BP: (81-146)/(47-100) 108/78 mmHg (08/29 0900) SpO2:  [93 %-100 %] 95 % (08/29 0900) Arterial Line BP: (80-101)/(51-67) 89/51 mmHg (08/28 1800) FiO2 (%):  [30 %] 30 % (08/29 0854) Weight:  [169 lb 8.5 oz (76.9 kg)-171 lb 11.8 oz (77.9 kg)] 171 lb 11.8 oz (77.9 kg) (08/29 0500) HEMODYNAMICS: CVP:  [9 mmHg-16 mmHg] 12 mmHg VENTILATOR SETTINGS: Vent Mode:  [-] PRVC FiO2 (%):  [30 %] 30 % Set Rate:  [20 bmp-30 bmp] 20 bmp Vt Set:  [600 mL] 600 mL PEEP:  [5 cmH20] 5 cmH20 Plateau Pressure:  [23 cmH20-35 cmH20] 23 cmH20 INTAKE / OUTPUT:  Intake/Output Summary (Last 24 hours) at 08/17/14 0936 Last data filed at 08/17/14 0900  Gross per 24 hour  Intake 2497.83 ml  Output   1405 ml  Net 1092.83 ml    PHYSICAL EXAMINATION: General:  Awake on vent, weaning HEENT: NCAT, ETT PULM: rhonchi bilaterally CV: distant heart sounds, RRR AB: BS+, soft Ext: cool, no edema Neuro: awake, alert, comfortable  LABS:  PULMONARY  Recent Labs Lab 08/16/14 0142 08/16/14 0253 08/17/14 0306  08/17/14 0425  PHART 7.238* 7.316* 7.508* 7.405  PCO2ART 49.9* 37.1 25.6* 33.7*  PO2ART 314.0* 59.0* 101.0* 80.0  HCO3 21.6 19.2* 20.3 21.1  TCO2 23 20 21 22   O2SAT 100.0 90.0 99.0 96.0   CBC  Recent Labs Lab 08/15/14 1957 08/16/14 0400 08/17/14 0400  HGB 11.1* 10.0* 9.4*  HCT 33.7* 30.1* 27.2*  WBC 15.1* 16.6* 14.8*  PLT 223 195 170   COAGULATION  Recent Labs Lab 08/13/14 1620  INR 0.97   CARDIAC   Recent Labs Lab 08/15/14 1957 08/16/14 0900 08/16/14 1456 08/16/14 2200 08/17/14 0200  TROPONINI 1.62* >20.00* >20.00* >20.00* >20.00*   Recent Labs Lab 08/15/14 1956  PROBNP 2319.0*   CHEMISTRY  Recent Labs Lab 08/14/14 0220 08/15/14 0400 08/15/14 1957 08/15/14 2144 08/16/14 0400 08/17/14 0400  NA 137 136* 136*  --  135* 134*  K 4.2 4.7 4.3  --  4.4 3.3*  CL 102 101 98  --  99 99  CO2 24 24 21   --  19 22  GLUCOSE 111* 113* 254*  --  188* 128*  BUN 12 8 10   --  13 20  CREATININE 1.03 1.03 1.18  --  1.24 2.12*  CALCIUM 8.7 9.0 9.1  --  8.1* 8.0*  MG  --   --   --  1.9 4.9* 2.2  PHOS  --   --   --  4.8* 3.9 1.5*   Estimated Creatinine Clearance: 30.3 ml/min (by C-G formula based on Cr of 2.12).  LIVER  Recent Labs Lab 08/13/14 1620 08/14/14 0220 08/15/14 1957  AST 17 16 26   ALT 12 9 11   ALKPHOS 86 64 83  BILITOT 0.4 0.3 0.5  PROT 8.2 6.1 7.5  ALBUMIN 4.1 3.2* 3.5  INR 0.97  --   --      INFECTIOUS  Recent Labs Lab 08/13/14 1650 08/16/14 0900 08/16/14 1500  LATICACIDVEN 0.91 3.0* 3.0*     ENDOCRINE CBG (last 3)   Recent Labs  08/17/14 0027 08/17/14 0423 08/17/14 0817  GLUCAP 139* 132* 136*         IMAGING x48h Dg Chest 1 View  08/15/2014   CLINICAL DATA:  rapid response, sob, tachycardia  EXAM: CHEST - 1 VIEW  COMPARISON:  08/15/2014 at 0515 hr  FINDINGS: Interval development of moderate pulmonary edema with perihilar and basilar alveolar edema. Cardiopericardial silhouette partially obscured but grossly  unchanged compared to prior. No pleural effusion. No pneumothorax. Old RIGHT distal clavicle fracture.  IMPRESSION: Interval development of moderate bilateral airspace disease consistent with pulmonary edema.   Electronically Signed   By: Dereck Ligas M.D.   On: 08/15/2014 19:58   Dg Chest Port 1 View  08/17/2014   CLINICAL DATA:  Check endotracheal tube placement  EXAM: PORTABLE CHEST - 1 VIEW  COMPARISON:  08/16/2014  FINDINGS: Cardiac shadow is stable. An endotracheal tube is again seen 5.5 cm above the carina. A nasogastric catheter is noted within the stomach as well as a left jugular line in the proximal superior vena cava. Patchy changes remain in the right lung base. Some clearing is noted in the right upper lobe. Mild increased atelectasis in the left lung base is noted. No pneumothorax is seen. No sizable effusion is noted.  IMPRESSION: Bibasilar changes right greater than left. The left-sided changes are new from the prior exam.   Electronically Signed   By: Inez Catalina M.D.   On: 08/17/2014 07:24   Dg Chest Port 1 View  08/16/2014   CLINICAL DATA:  Evaluate endotracheal tube  EXAM: PORTABLE CHEST - 1 VIEW  COMPARISON:  08/15/2014  FINDINGS: Endotracheal tube tip about 5.8 cm above the carina. Left central line in unchanged position. NG tube again crosses the gastroesophageal junction. Extensive infiltrate right middle and lower lobe stable. No significant findings on the left.  IMPRESSION: Endotracheal tube as described.  Persistent right-sided infiltrate.   Electronically Signed   By: Skipper Cliche M.D.   On: 08/16/2014 07:37   Dg Chest Port 1 View  08/15/2014   CLINICAL DATA:  Line and tube placement.  EXAM: PORTABLE CHEST - 1 VIEW  COMPARISON:  08/15/2014.  FINDINGS: Interval left jugular catheter with its tip in the proximal superior vena cava. No pneumothorax. Interval endotracheal tube in satisfactory position. Interval nasogastric tube with its tip in the proximal stomach and side  hole in the distal esophagus. Normal sized heart. No significant change in diffuse bilateral interstitial prominence and patchy airspace opacity in the right mid and lower lung zones and medial left lung base. Diffuse osteopenia.  IMPRESSION: 1. Tubes and catheter, as described above. 2. Stable changes of congestive heart failure.   Electronically Signed   By: Enrique Sack M.D.   On: 08/15/2014 22:04   ASSESSMENT / PLAN:  PULMONARY OETT 08/15/14 >> A:  Acute Respiratory Failure due to Acute Pulmonary Edema  COPD, O2 dependent, not  in exacerbation P:   Begin PS trials but no extubation until more hemodynamically stable. Hold diureses while hypotensive but will need diureses once hemodynamically more stable. Pulmicort, q4H duoneb. PRN BD.  CARDIOVASCULAR CVL 08/15/14 Left IJ A:  NSTEMI Cardiogenic shock Acute decompensated ischemic CHF Known CAD VTach P:  Per cardiology Continue levophed. Continue IABP  RENAL A:  Hyponatremia, mild P:   Monitor UOP, BMET KVO IVF Replace electrolytes as indicated  GASTROINTESTINAL A:   Mesenteric ischemia? Possible lower GI bleed on admission, no brisk bleeding now P:   SUP CT angio abdomen at some point this hospitalization but once more hemodynamically stable Reconsult GI after cardiac issues resolved Monitor lactic acid, if rising get CT abdomen sooner  HEMATOLOGIC A:  Normocytic anemia, Lower gi bleeding? P:  PRBC goal  hgb > 8gm% in setting of MI  INFECTIOUS A:  HCAP? Fever, infiltrate P:   BCx2 - 8/28 > UC none  Sputum- 8/28 > Vanc 8/28 > Zosyn 8/28 >  ENDOCRINE A:  No acute issues P:   Monitor glucose  NEUROLOGIC A:  RAS -3 post intubation meds, severe Anxiety P:   RASS goal: 0 to -2 Fent gtt PRN versed  TODAY'S SUMMARY: Weaning very well but remains hemodynamically very unstable with high dose levo and IABP in place.  Will continue weaning but no extubation until more hemodynamically stable.  CC time 35  minutes  Rush Farmer, M.D. [facility] Pulmonary/Critical Care Medicine. Pager: [phone]. After hours pager: [phone].  08/17/2014 9:36 AM

## 2014-08-17 NOTE — Progress Notes (Signed)
EKG CRITICAL VALUE     12 lead EKG performed.  Critical value noted.  Ermalene Postin, RN notified.   Davante Gerke C, CCT 08/17/2014 7:51 AM

## 2014-08-17 NOTE — Progress Notes (Signed)
[facility] Progress Note Patient Name: Douglas Mann DOB: 08-25-1946 MRN: [id number]   Date of Service  08/17/2014  HPI/Events of Note  Hypophophatemia and hypokalemia  eICU Interventions  Phos and potassium replaced     Intervention Category Intermediate Interventions: Electrolyte abnormality - evaluation and management  DETERDING,ELIZABETH 08/17/2014, 5:28 AM

## 2014-08-17 NOTE — Progress Notes (Signed)
[place] for heparin Indication: IABP  No Known Allergies  Patient Measurements: Height: 5\' 7"  (170.2 cm) Weight: 171 lb 11.8 oz (77.9 kg) IBW/kg (Calculated) : 66.1   Vital Signs: Temp: 98.6 F (37 C) (08/29 0745) Temp src: Oral (08/29 0745) BP: 92/57 mmHg (08/29 1100) Pulse Rate: 93 (08/29 1100) Intake/Output from previous day: 08/28 0701 - 08/29 0700 In: 2513.3 [I.V.:1901.8; NG/GT:111.5; IV Piggyback:500] Out: 1402 [Urine:1402] Intake/Output from this shift: Total I/O In: 305.3 [I.V.:145.3; NG/GT:110; IV Piggyback:50] Out: 238 [Urine:238]  Labs:  Recent Labs  08/15/14 1957 08/16/14 0400 08/17/14 0400  WBC 15.1* 16.6* 14.8*  HGB 11.1* 10.0* 9.4*  PLT 223 195 170  CREATININE 1.18 1.24 2.12*   Estimated Creatinine Clearance: 30.3 ml/min (by C-G formula based on Cr of 2.12). No results found for this basename: VANCOTROUGH, Corlis Leak, VANCORANDOM, [place], GENTPEAK, [place], [place], TOBRAPEAK, TOBRARND, AMIKACINPEAK, AMIKACINTROU, AMIKACIN,  in the last 72 hours   Microbiology: Recent Results (from the past 720 hour(s))  MRSA PCR SCREENING     Status: None   Collection Time    08/16/14  1:59 AM      Result Value Ref Range Status   MRSA by PCR NEGATIVE  NEGATIVE Final   Comment:            The GeneXpert MRSA Assay (FDA     approved for NASAL specimens     only), is one component of a     comprehensive MRSA colonization     surveillance program. It is not     intended to diagnose MRSA     infection nor to guide or     monitor treatment for     MRSA infections.  CULTURE, RESPIRATORY (NON-EXPECTORATED)     Status: None   Collection Time    08/16/14 11:48 AM      Result Value Ref Range Status   Specimen Description TRACHEAL ASPIRATE   Final   Special Requests NONE   Final   Gram Stain     Final   Value: FEW WBC PRESENT,BOTH PMN AND MONONUCLEAR     RARE SQUAMOUS EPITHELIAL CELLS PRESENT     FEW GRAM POSITIVE COCCI     IN  PAIRS IN CLUSTERS RARE GRAM NEGATIVE RODS     Performed at [facility]   Culture PENDING   Incomplete   Report Status PENDING   Incomplete     Assessment: 68 yo male with VDRF s/p VT and NSTEMI. 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 He was also admitted to [facility] 8/25 with concern of GIB (also with history of rectal bleeding) and currently Hg stable. GI has seen and concern for mesenteric ischemia.  Now he is s/p PTCA and stenting of proximal LAD and IABP placement.  Pharmacy consulted to restart heparin drip for the IABP. HL now SUBtherapeutic at 0.15. H/H is low and has trended down, plt wnl but have trended down, no reported s/s bleeding.   Goal of Therapy: Heparin level 0.2 -0.5 for IABP  Plan:  - Increase heparin to 850 u/hr - HL in 8 hours - Daily HL, CBC - Monitor for s/s bleeding  Harolyn Rutherford, PharmD Clinical Pharmacist - Resident Pager: [phone] Pharmacy: [phone] 08/17/2014 11:35 AM

## 2014-08-17 NOTE — Progress Notes (Signed)
Repeat LHC and IABP placement yesterday noted.  Please call, page next week if there is still a question or concern for significant GI issues, mesenteric ischemia as he recovers from cardiac acute illness.

## 2014-08-17 NOTE — Progress Notes (Signed)
SUBJECTIVE:  Intubated but awake  OBJECTIVE:   Vitals:   Filed Vitals:   08/17/14 0800 08/17/14 0830 08/17/14 0854 08/17/14 0900  BP: 83/63 95/48  108/78  Pulse: 83 97  98  Temp:      TempSrc:      Resp: 20 14  15   Height:      Weight:      SpO2: 93% 97% 96% 95%   I&O's:   Intake/Output Summary (Last 24 hours) at 08/17/14 0919 Last data filed at 08/17/14 0900  Gross per 24 hour  Intake 2497.83 ml  Output   1405 ml  Net 1092.83 ml   TELEMETRY: Reviewed telemetry pt in NSR:     PHYSICAL EXAM General: Well developed, well nourished, in no acute distress Head: Eyes PERRLA, No xanthomas.   Normal cephalic and atramatic  Lungs:   Clear bilaterally to auscultation and percussion. Heart:   HRRR S1 S2 Pulses are 2+ & equal. Abdomen: Bowel sounds are positive, abdomen soft and non-tender without masses  Extremities:   No clubbing, cyanosis or edema.  DP +1 Neuro: Alert and oriented X 3. Psych:  Good affect, responds appropriately   LABS: Basic Metabolic Panel:  Recent Labs  08/16/14 0400 08/17/14 0400  NA 135* 134*  K 4.4 3.3*  CL 99 99  CO2 19 22  GLUCOSE 188* 128*  BUN 13 20  CREATININE 1.24 2.12*  CALCIUM 8.1* 8.0*  MG 4.9* 2.2  PHOS 3.9 1.5*   Liver Function Tests:  Recent Labs  08/15/14 1957  AST 26  ALT 11  ALKPHOS 83  BILITOT 0.5  PROT 7.5  ALBUMIN 3.5   No results found for this basename: LIPASE, AMYLASE,  in the last 72 hours CBC:  Recent Labs  08/16/14 0400 08/17/14 0400  WBC 16.6* 14.8*  NEUTROABS 14.9* 11.8*  HGB 10.0* 9.4*  HCT 30.1* 27.2*  MCV 91.8 85.5  PLT 195 170   Cardiac Enzymes:  Recent Labs  08/16/14 1456 08/16/14 2200 08/17/14 0200  TROPONINI >20.00* >20.00* >20.00*   BNP: No components found with this basename: POCBNP,  D-Dimer: No results found for this basename: DDIMER,  in the last 72 hours Hemoglobin A1C: No results found for this basename: HGBA1C,  in the last 72 hours Fasting Lipid Panel: No  results found for this basename: CHOL, HDL, LDLCALC, TRIG, CHOLHDL, LDLDIRECT,  in the last 72 hours Thyroid Function Tests: No results found for this basename: TSH, T4TOTAL, FREET3, T3FREE, THYROIDAB,  in the last 72 hours Anemia Panel: No results found for this basename: VITAMINB12, FOLATE, FERRITIN, TIBC, IRON, RETICCTPCT,  in the last 72 hours Coag Panel:   Lab Results  Component Value Date   INR 0.97 08/13/2014   INR 0.91 09/06/2011    RADIOLOGY: Dg Chest 1 View  08/15/2014   CLINICAL DATA:  rapid response, sob, tachycardia  EXAM: CHEST - 1 VIEW  COMPARISON:  08/15/2014 at 0515 hr  FINDINGS: Interval development of moderate pulmonary edema with perihilar and basilar alveolar edema. Cardiopericardial silhouette partially obscured but grossly unchanged compared to prior. No pleural effusion. No pneumothorax. Old RIGHT distal clavicle fracture.  IMPRESSION: Interval development of moderate bilateral airspace disease consistent with pulmonary edema.   Electronically Signed   By: Dereck Ligas M.D.   On: 08/15/2014 19:58   Ct Abdomen Pelvis W Contrast  08/13/2014   CLINICAL DATA:  GI bleeding.  EXAM: CT ABDOMEN AND PELVIS WITH CONTRAST  TECHNIQUE: Multidetector CT imaging of the abdomen  and pelvis was performed using the standard protocol following bolus administration of intravenous contrast.  CONTRAST:  58mL OMNIPAQUE IOHEXOL 300 MG/ML SOLN, 147mL OMNIPAQUE IOHEXOL 300 MG/ML SOLN  COMPARISON:  None.  FINDINGS: The lung bases demonstrate a focal calcification over the post to medial right duct matter border. There is minimal linear scarring over the right base.  Abdominal images demonstrate several calcified splenic granulomas. There is mild cholelithiasis. The liver, pancreas and adrenal glands are within normal. Kidneys normal in size without hydronephrosis or focal mass. There are a couple small calcifications adjacent the left renal hilum likely vascular. Ureters are normal. The appendix is  normal. There is diverticulosis throughout the colon including the cecum. There is calcified plaque involving the abdominal aorta and iliac vessels. There is minimal dilatation of the infrarenal abdominal aorta measuring 2.7 cm in AP diameter.  Pelvic images demonstrate the bladder, prostate and rectum to be within normal. There is no free fluid or inflammatory change noted. Hardware is present over the left proximal femur. There are mild degenerative changes of the spine and hips.  IMPRESSION: No acute findings in the abdomen/pelvis.  Moderate diverticulosis throughout the colon without active inflammation.  Mild cholelithiasis.   Electronically Signed   By: Marin Olp M.D.   On: 08/13/2014 17:52   Dg Chest Port 1 View  08/17/2014   CLINICAL DATA:  Check endotracheal tube placement  EXAM: PORTABLE CHEST - 1 VIEW  COMPARISON:  08/16/2014  FINDINGS: Cardiac shadow is stable. An endotracheal tube is again seen 5.5 cm above the carina. A nasogastric catheter is noted within the stomach as well as a left jugular line in the proximal superior vena cava. Patchy changes remain in the right lung base. Some clearing is noted in the right upper lobe. Mild increased atelectasis in the left lung base is noted. No pneumothorax is seen. No sizable effusion is noted.  IMPRESSION: Bibasilar changes right greater than left. The left-sided changes are new from the prior exam.   Electronically Signed   By: Inez Catalina M.D.   On: 08/17/2014 07:24   Dg Chest Port 1 View  08/16/2014   CLINICAL DATA:  Evaluate endotracheal tube  EXAM: PORTABLE CHEST - 1 VIEW  COMPARISON:  08/15/2014  FINDINGS: Endotracheal tube tip about 5.8 cm above the carina. Left central line in unchanged position. NG tube again crosses the gastroesophageal junction. Extensive infiltrate right middle and lower lobe stable. No significant findings on the left.  IMPRESSION: Endotracheal tube as described.  Persistent right-sided infiltrate.   Electronically  Signed   By: Skipper Cliche M.D.   On: 08/16/2014 07:37   Dg Chest Port 1 View  08/15/2014   CLINICAL DATA:  Line and tube placement.  EXAM: PORTABLE CHEST - 1 VIEW  COMPARISON:  08/15/2014.  FINDINGS: Interval left jugular catheter with its tip in the proximal superior vena cava. No pneumothorax. Interval endotracheal tube in satisfactory position. Interval nasogastric tube with its tip in the proximal stomach and side hole in the distal esophagus. Normal sized heart. No significant change in diffuse bilateral interstitial prominence and patchy airspace opacity in the right mid and lower lung zones and medial left lung base. Diffuse osteopenia.  IMPRESSION: 1. Tubes and catheter, as described above. 2. Stable changes of congestive heart failure.   Electronically Signed   By: Enrique Sack M.D.   On: 08/15/2014 22:04   Dg Chest Port 1 View  08/15/2014   CLINICAL DATA:  Chest pain  EXAM:  PORTABLE CHEST - 1 VIEW  COMPARISON:  09/06/2011  FINDINGS: Small stable granuloma in the right upper lobe near the minor fissure. There is minor lung base linear/reticular opacity that is most likely atelectasis. Lungs are otherwise clear. No pleural effusion or pneumothorax.  Normal heart, mediastinum and hila.  Bony thorax is demineralized but grossly intact.  IMPRESSION: No active disease.   Electronically Signed   By: Lajean Manes M.D.   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 EF 5 -10% by LV gram - following non-STEMI and ventricular tachycardia  HYPERLIPIDEMIA  GI bleed  HYPERTENSION  Acute pulmonary edema   1.  Acute NSTEMI s/p PCI of LAD under IABP support -continue ASA/statin/Plavix/Heparin. - No BB or ACE I secondary to hypotension  2.  DM - on SSI - but no coverage given.  3.  Severe ischemic DCM with EF 10% at cath and by echo yesterday 30-35%.  Hemodynamically still hypotensive on IABP on Levophed.  Wean IABP off tomorrow after patient extubated as BP tolerates.  Will check co-ox today 4.  PVD of mesenteric vessels  -- once more stable hemodynamically & potentially no longer ischemic, would consider CTA Abdomen to look for Celiac Artery disease for potential of mesenteric ischemia. SMA actually looked relatively normal on aortogram at cath.   5.  VDRF - Vent/Sedation & COPD Rx to PCCM -- convert to steroid nebulizer & DuoNeb.  6.  Ventricular tachycardia in setting of NSTEMI - no further VT on IV Amio.  Will continue IV for now until taking PO 7.  Hypokalemia - replete 8.  Acute on chronic renal insuff.  Creatinine has jumped this am.  May be intravascularly dry with BP dropping into the 80's.  His I&O's are still positive.  CVP is 7-9 so will hold Lasix for now.     The patient is critically ill with multisystem organ failure requiring extensive monitoring & medication management.     Douglas Margarita, MD  08/17/2014  9:19 AM

## 2014-08-17 NOTE — Progress Notes (Signed)
[place] for heparin Indication: IABP  No Known Allergies  Patient Measurements: Height: 5\' 7"  (170.2 cm) Weight: 171 lb 11.8 oz (77.9 kg) IBW/kg (Calculated) : 66.1   Vital Signs: Temp: 99.3 F (37.4 C) (08/29 1600) Temp src: Oral (08/29 1600) BP: 103/53 mmHg (08/29 1900) Pulse Rate: 102 (08/29 2000) Intake/Output from previous day: 08/28 0701 - 08/29 0700 In: 2520.8 [I.V.:1909.3; NG/GT:111.5; IV Piggyback:500] Out: 1402 [HYIFO:2774] Intake/Output from this shift:    Labs:  Recent Labs  08/15/14 1957 08/16/14 0400 08/17/14 0400  WBC 15.1* 16.6* 14.8*  HGB 11.1* 10.0* 9.4*  PLT 223 195 170  CREATININE 1.18 1.24 2.12*   Estimated Creatinine Clearance: 30.3 ml/min (by C-G formula based on Cr of 2.12). No results found for this basename: VANCOTROUGH, Corlis Leak, VANCORANDOM, [place], GENTPEAK, [place], [place], TOBRAPEAK, TOBRARND, AMIKACINPEAK, AMIKACINTROU, AMIKACIN,  in the last 72 hours   Microbiology: Recent Results (from the past 720 hour(s))  MRSA PCR SCREENING     Status: None   Collection Time    08/16/14  1:59 AM      Result Value Ref Range Status   MRSA by PCR NEGATIVE  NEGATIVE Final   Comment:            The GeneXpert MRSA Assay (FDA     approved for NASAL specimens     only), is one component of a     comprehensive MRSA colonization     surveillance program. It is not     intended to diagnose MRSA     infection nor to guide or     monitor treatment for     MRSA infections.  CULTURE, RESPIRATORY (NON-EXPECTORATED)     Status: None   Collection Time    08/16/14 11:48 AM      Result Value Ref Range Status   Specimen Description TRACHEAL ASPIRATE   Final   Special Requests NONE   Final   Gram Stain     Final   Value: FEW WBC PRESENT,BOTH PMN AND MONONUCLEAR     RARE SQUAMOUS EPITHELIAL CELLS PRESENT     FEW GRAM POSITIVE COCCI     IN PAIRS IN CLUSTERS RARE GRAM NEGATIVE RODS     Performed at [facility]    Culture     Final   Value: Culture reincubated for better growth     Performed at [facility]   Report Status PENDING   Incomplete  CULTURE, BLOOD (ROUTINE X 2)     Status: None   Collection Time    08/16/14 12:25 PM      Result Value Ref Range Status   Specimen Description BLOOD RIGHT HAND   Final   Special Requests BOTTLES DRAWN AEROBIC AND ANAEROBIC 5CC   Final   Culture  Setup Time     Final   Value: 08/16/2014 17:16     Performed at [facility]   Culture     Final   Value:        BLOOD CULTURE RECEIVED NO GROWTH TO DATE CULTURE WILL BE HELD FOR 5 DAYS BEFORE ISSUING A FINAL NEGATIVE REPORT     Performed at [facility]   Report Status PENDING   Incomplete  CULTURE, BLOOD (ROUTINE X 2)     Status: None   Collection Time    08/16/14 12:40 PM      Result Value Ref Range Status   Specimen Description BLOOD LEFT HAND   Final  Special Requests BOTTLES DRAWN AEROBIC AND ANAEROBIC 5CC   Final   Culture  Setup Time     Final   Value: 08/16/2014 17:16     Performed at [facility]   Culture     Final   Value:        BLOOD CULTURE RECEIVED NO GROWTH TO DATE CULTURE WILL BE HELD FOR 5 DAYS BEFORE ISSUING A FINAL NEGATIVE REPORT     Performed at [facility]   Report Status PENDING   Incomplete     Assessment: 68 yo male with VDRF s/p VT and NSTEMI. 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 He was also admitted to [facility] 8/25 with concern of GIB (also with history of rectal bleeding) and currently Hg stable. GI has seen and concern for mesenteric ischemia.  Now he is s/p PTCA and stenting of proximal LAD and IABP placement.  Pharmacy consulted to restart heparin drip for the IABP.   HL remian SUBtherapeutic at 0.17. H/H is low and has trended down, plt wnl but have trended down, no reported s/s bleeding. Nurse states heparin infusing without problem through peripheral line.  Goal of Therapy: Heparin level 0.2 -0.5 for IABP  Plan:   - Increase heparin to 950 u/hr - HL in 8 hours - Daily HL, CBC - Monitor for s/s bleeding  Thank you for allowing pharmacy to be a part of this patients care team.  Rowe Robert Pharm.D., BCPS, AQ-Cardiology Clinical Pharmacist 08/17/2014 8:16 PM Pager: [phone] Phone: [phone]

## 2014-08-17 NOTE — Progress Notes (Signed)
CONSULT NOTE - INITIAL  Pharmacy Consult for vancomycin, zosyn Indication: HCAP   No Known Allergies  Patient Measurements: Height: 5\' 7"  (170.2 cm) Weight: 171 lb 11.8 oz (77.9 kg) IBW/kg (Calculated) : 66.1   Vital Signs: Temp: 98.6 F (37 C) (08/29 0745) Temp src: Oral (08/29 0745) BP: 92/57 mmHg (08/29 1100) Pulse Rate: 93 (08/29 1100) Intake/Output from previous day: 08/28 0701 - 08/29 0700 In: 2520.8 [I.V.:1909.3; NG/GT:111.5; IV Piggyback:500] Out: 1402 [Urine:1402] Intake/Output from this shift: Total I/O In: 343.8 [I.V.:183.8; NG/GT:110; IV Piggyback:50] Out: 238 [Urine:238]  Labs:  Recent Labs  08/15/14 1957 08/16/14 0400 08/17/14 0400  WBC 15.1* 16.6* 14.8*  HGB 11.1* 10.0* 9.4*  PLT 223 195 170  CREATININE 1.18 1.24 2.12*   Estimated Creatinine Clearance: 30.3 ml/min (by C-G formula based on Cr of 2.12). No results found for this basename: VANCOTROUGH, Corlis Leak, VANCORANDOM, [place], GENTPEAK, [place], [place], TOBRAPEAK, TOBRARND, AMIKACINPEAK, AMIKACINTROU, AMIKACIN,  in the last 72 hours   Microbiology: Recent Results (from the past 720 hour(s))  MRSA PCR SCREENING     Status: None   Collection Time    08/16/14  1:59 AM      Result Value Ref Range Status   MRSA by PCR NEGATIVE  NEGATIVE Final   Comment:            The GeneXpert MRSA Assay (FDA     approved for NASAL specimens     only), is one component of a     comprehensive MRSA colonization     surveillance program. It is not     intended to diagnose MRSA     infection nor to guide or     monitor treatment for     MRSA infections.  CULTURE, RESPIRATORY (NON-EXPECTORATED)     Status: None   Collection Time    08/16/14 11:48 AM      Result Value Ref Range Status   Specimen Description TRACHEAL ASPIRATE   Final   Special Requests NONE   Final   Gram Stain     Final   Value: FEW WBC PRESENT,BOTH PMN AND MONONUCLEAR     RARE SQUAMOUS EPITHELIAL CELLS PRESENT     FEW GRAM  POSITIVE COCCI     IN PAIRS IN CLUSTERS RARE GRAM NEGATIVE RODS     Performed at [facility]   Culture PENDING   Incomplete   Report Status PENDING   Incomplete    Medical History: Past Medical History  Diagnosis Date  . HYPERLIPIDEMIA 10/01/2009  . HYPERTENSION 10/01/2009  . CAD 10/01/2009    Stent at [facility] greater than 10 years ago  . PVD 10/01/2009  . ALLERGIC RHINITIS 12/24/2009  . COPD 10/01/2009  . 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 antiseptic oral rinse  7 mL Mouth Rinse QID  . aspirin  81 mg Oral Daily  . atorvastatin  40 mg Per Tube q1800  . budesonide (PULMICORT) nebulizer solution  0.25 mg Nebulization BID  . capsaicin   Topical BID  . chlorhexidine  15 mL Mouth Rinse BID  . clopidogrel  75 mg Per Tube Daily  . feeding supplement (PRO-STAT SUGAR FREE 64)  30 mL Per Tube BID  . fentaNYL  50 mcg Intravenous Once  . folic acid  1 mg Per Tube Daily  . hydrocortisone   Rectal BID  . insulin aspart  2-6 Units Subcutaneous 6 times per day  . ipratropium-albuterol  3 mL Nebulization  Q4H  . pantoprazole (PROTONIX) IV  40 mg Intravenous Q12H  . piperacillin-tazobactam (ZOSYN)  IV  3.375 g Intravenous Q8H  . potassium phosphate IVPB (mmol)  30 mmol Intravenous Once  . sodium chloride  3 mL Intravenous Q12H  . sodium chloride  3 mL Intravenous Q12H  . sodium chloride  3 mL Intravenous Q12H  . sucralfate  1 g Oral TID WC & HS  . thiamine  100 mg Per Tube Daily  . triamcinolone cream   Topical BID  . [START ON 08/18/2014] vancomycin  750 mg Intravenous Q24H    Assessment: 68 yo male with VDRF s/p VT and NSTEMI noted with WBC= 16.6, tmax= 101.3 and CXR with infiltrate to begin zosyn and vancomycin for possible HCAP. Now day #2 IV abx. Tmax 102.6, WBC trending down to 14.8, SCr significantly elevated at 2.12 from 1.24.   8/28 vanc>> 8/28 zosyn>>  8/28 blood x2>> 8/28 resp>>  Goal of Therapy:  Vancomycin trough  level 15-20 mcg/ml  Plan:  - Continue Zosyn 3.375gm IV q8h - Decrease Vancomycin to 750mg  IV q24h -Will follow renal function, cultures, VT at [facility] and clinical progress  Harolyn Rutherford, PharmD Clinical Pharmacist - Resident Pager: [phone] Pharmacy: [phone] 08/17/2014 11:40 AM

## 2014-08-17 NOTE — Progress Notes (Signed)
205 ml  Residual obtained from OGT . Dulcolax 10 mg supp given PR.

## 2014-08-18 ENCOUNTER — Inpatient Hospital Stay (HOSPITAL_COMMUNITY): Payer: Medicare Other

## 2014-08-18 LAB — BLOOD GAS, ARTERIAL
Acid-base deficit: 0.8 mmol/L (ref 0.0–2.0)
BICARBONATE: 23 meq/L (ref 20.0–24.0)
FIO2: 0.3 %
MECHVT: 600 mL
O2 Saturation: 96.1 %
PATIENT TEMPERATURE: 98.6
PEEP/CPAP: 5 cmH2O
PH ART: 7.422 (ref 7.350–7.450)
RATE: 20 resp/min
TCO2: 24.2 mmol/L (ref 0–100)
pCO2 arterial: 36 mmHg (ref 35.0–45.0)
pO2, Arterial: 83.2 mmHg (ref 80.0–100.0)

## 2014-08-18 LAB — CBC WITH DIFFERENTIAL/PLATELET
BASOS PCT: 0 % (ref 0–1)
Basophils Absolute: 0 10*3/uL (ref 0.0–0.1)
Eosinophils Absolute: 0 10*3/uL (ref 0.0–0.7)
Eosinophils Relative: 0 % (ref 0–5)
HEMATOCRIT: 24.5 % — AB (ref 39.0–52.0)
Hemoglobin: 8.7 g/dL — ABNORMAL LOW (ref 13.0–17.0)
LYMPHS PCT: 9 % — AB (ref 12–46)
Lymphs Abs: 1.1 10*3/uL (ref 0.7–4.0)
MCH: 30.7 pg (ref 26.0–34.0)
MCHC: 35.5 g/dL (ref 30.0–36.0)
MCV: 86.6 fL (ref 78.0–100.0)
MONO ABS: 1.1 10*3/uL — AB (ref 0.1–1.0)
Monocytes Relative: 8 % (ref 3–12)
Neutro Abs: 11.1 10*3/uL — ABNORMAL HIGH (ref 1.7–7.7)
Neutrophils Relative %: 83 % — ABNORMAL HIGH (ref 43–77)
Platelets: 134 10*3/uL — ABNORMAL LOW (ref 150–400)
RBC: 2.83 MIL/uL — ABNORMAL LOW (ref 4.22–5.81)
RDW: 14.2 % (ref 11.5–15.5)
WBC: 13.3 10*3/uL — ABNORMAL HIGH (ref 4.0–10.5)

## 2014-08-18 LAB — GLUCOSE, CAPILLARY
GLUCOSE-CAPILLARY: 128 mg/dL — AB (ref 70–99)
GLUCOSE-CAPILLARY: 139 mg/dL — AB (ref 70–99)
Glucose-Capillary: 134 mg/dL — ABNORMAL HIGH (ref 70–99)
Glucose-Capillary: 144 mg/dL — ABNORMAL HIGH (ref 70–99)
Glucose-Capillary: 160 mg/dL — ABNORMAL HIGH (ref 70–99)
Glucose-Capillary: 189 mg/dL — ABNORMAL HIGH (ref 70–99)

## 2014-08-18 LAB — BASIC METABOLIC PANEL
Anion gap: 14 (ref 5–15)
BUN: 32 mg/dL — AB (ref 6–23)
CO2: 22 mEq/L (ref 19–32)
CREATININE: 2.47 mg/dL — AB (ref 0.50–1.35)
Calcium: 8.2 mg/dL — ABNORMAL LOW (ref 8.4–10.5)
Chloride: 96 mEq/L (ref 96–112)
GFR, EST AFRICAN AMERICAN: 29 mL/min — AB (ref >90)
GFR, EST NON AFRICAN AMERICAN: 25 mL/min — AB (ref >90)
Glucose, Bld: 159 mg/dL — ABNORMAL HIGH (ref 70–99)
Potassium: 3.3 mEq/L — ABNORMAL LOW (ref 3.7–5.3)
Sodium: 132 mEq/L — ABNORMAL LOW (ref 137–147)

## 2014-08-18 LAB — CULTURE, RESPIRATORY W GRAM STAIN

## 2014-08-18 LAB — CULTURE, RESPIRATORY

## 2014-08-18 LAB — MAGNESIUM: Magnesium: 2.2 mg/dL (ref 1.5–2.5)

## 2014-08-18 LAB — HEPARIN LEVEL (UNFRACTIONATED)
Heparin Unfractionated: 0.24 IU/mL — ABNORMAL LOW (ref 0.30–0.70)
Heparin Unfractionated: 0.21 IU/mL — ABNORMAL LOW (ref 0.30–0.70)

## 2014-08-18 LAB — POCT ACTIVATED CLOTTING TIME: Activated Clotting Time: 135 seconds

## 2014-08-18 LAB — PHOSPHORUS: Phosphorus: 2.6 mg/dL (ref 2.3–4.6)

## 2014-08-18 MED ORDER — CETYLPYRIDINIUM CHLORIDE 0.05 % MT LIQD: 7.0000 mL | Freq: Two times a day (BID) | OROMUCOSAL | Status: DC

## 2014-08-18 MED ORDER — HYDROCODONE-ACETAMINOPHEN 5-325 MG PO TABS
1.0000 | ORAL_TABLET | Freq: Four times a day (QID) | ORAL | Status: DC | PRN
  Administered 2014-08-18: 2 via ORAL
  Filled 2014-08-18: qty 2

## 2014-08-18 MED ORDER — POTASSIUM CHLORIDE 20 MEQ/15ML (10%) PO LIQD
40.0000 meq | Freq: Once | ORAL | Status: AC
  Administered 2014-08-18: 40 meq
  Filled 2014-08-18: qty 30

## 2014-08-18 MED ORDER — ATORVASTATIN CALCIUM 80 MG PO TABS
80.0000 mg | ORAL_TABLET | Freq: Every day | ORAL | Status: DC
  Administered 2014-08-18 – 2014-08-19 (×2): 80 mg
  Filled 2014-08-18 (×3): qty 1

## 2014-08-18 NOTE — Procedures (Signed)
Extubation Procedure Note  Patient Details:   Name: Douglas Mann DOB: 10/08/1946 MRN: [id number]   Airway Documentation:     Evaluation  O2 sats: stable throughout Complications: No apparent complications Patient did tolerate procedure well. Bilateral Breath Sounds: Clear;Diminished Suctioning: Oral Yes  Pt extubated per MD order 1 hour after femoral sheath removed.  Sats 93% on 4l [place].  Pt states he wears 4l  at home.  RT will continue to monitor.  RN at bedside.  Closson, Deetta Perla 08/18/2014, 4:41 PM

## 2014-08-18 NOTE — Progress Notes (Signed)
PULMONARY / CRITICAL CARE MEDICINE   Name: Douglas Mann MRN: [id number] DOB: 1946-04-28    ADMISSION DATE:  08/13/2014 CONSULTATION DATE:  08/15/2014   REFERRING MD :   [facility]  CHIEF COMPLAINT:  NSTEMI - > V tach with BP -> Acute pulm edema -> VDRF -> shock  INITIAL PRESENTATION:  68 y/o male admitted on 8/25 for lower GI bleeding and abdominal pain who developed chest pain, NSTEMI, VTACH, on 8/27.  He was intubated and went to the cath lab where he was found to have a markedly elevated LVEDP.  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  VITAL SIGNS: Temp:  [99.1 F (37.3 C)-99.9 F (37.7 C)] 99.1 F (37.3 C) (08/30 0800) Pulse Rate:  [25-103] 101 (08/30 0920) Resp:  [9-30] 21 (08/30 0920) BP: (88-133)/(37-76) 113/68 mmHg (08/30 0920) SpO2:  [93 %-100 %] 99 % (08/30 0920) FiO2 (%):  [30 %] 30 % (08/30 0920) Weight:  [177 lb 7.5 oz (80.5 kg)] 177 lb 7.5 oz (80.5 kg) (08/30 0357)  HEMODYNAMICS: CVP:  [4 mmHg-10 mmHg] 7 mmHg  VENTILATOR SETTINGS: Vent Mode:  [-] PSV FiO2 (%):  [30 %] 30 % Set Rate:  [20 bmp] 20 bmp Vt Set:  [600 mL] 600 mL PEEP:  [5 cmH20] 5 cmH20 Pressure Support:  [5 cmH20-10 cmH20] 5 cmH20 Plateau Pressure:  [20 cmH20] 20 cmH20  INTAKE / OUTPUT:  Intake/Output Summary (Last 24 hours) at 08/18/14 0951 Last data filed at 08/18/14 0900  Gross per 24 hour  Intake 2417.93 ml  Output   1755 ml  Net 662.93 ml   PHYSICAL EXAMINATION: General:  Awake on vent, weaning HEENT: NCAT, ETT PULM: rhonchi bilaterally CV: distant heart sounds, RRR AB: BS+, soft Ext: cool, no edema Neuro: awake, alert, comfortable  LABS:  PULMONARY  Recent Labs Lab 08/16/14 0142 08/16/14 0253 08/17/14 0306 08/17/14 0425 08/17/14 1030 08/18/14 0324   PHART 7.238* 7.316* 7.508* 7.405  --  7.422  PCO2ART 49.9* 37.1 25.6* 33.7*  --  36.0  PO2ART 314.0* 59.0* 101.0* 80.0  --  83.2  HCO3 21.6 19.2* 20.3 21.1  --  23.0  TCO2 23 20 21 22   --  24.2  O2SAT 100.0 90.0 99.0 96.0 90.2 96.1   CBC  Recent Labs Lab 08/16/14 0400 08/17/14 0400 08/18/14 0329  HGB 10.0* 9.4* 8.7*  HCT 30.1* 27.2* 24.5*  WBC 16.6* 14.8* 13.3*  PLT 195 170 134*   COAGULATION  Recent Labs Lab 08/13/14 1620  INR 0.97   CARDIAC  Recent Labs Lab 08/15/14 1957 08/16/14 0900 08/16/14 1456 08/16/14 2200 08/17/14 0200  TROPONINI 1.62* >20.00* >20.00* >20.00* >20.00*   Recent Labs Lab 08/15/14 1956  PROBNP 2319.0*   CHEMISTRY  Recent Labs Lab 08/15/14 0400 08/15/14 1957 08/15/14 2144 08/16/14 0400 08/17/14 0400 08/18/14 0329  NA 136* 136*  --  135* 134* 132*  K 4.7 4.3  --  4.4 3.3* 3.3*  CL 101 98  --  99 99 96  CO2 24 21  --  19 22 22   GLUCOSE 113* 254*  --  188* 128* 159*  BUN 8 10  --  13 20 32*  CREATININE 1.03 1.18  --  1.24 2.12* 2.47*  CALCIUM 9.0 9.1  --  8.1* 8.0* 8.2*  MG  --   --  1.9 4.9* 2.2 2.2  PHOS  --   --  4.8* 3.9 1.5* 2.6   Estimated Creatinine Clearance: 28.3 ml/min (by C-G formula based on Cr of 2.47).  LIVER  Recent Labs Lab 08/13/14 1620 08/14/14 0220 08/15/14 1957  AST 17 16 26   ALT 12 9 11   ALKPHOS 86 64 83  BILITOT 0.4 0.3 0.5  PROT 8.2 6.1 7.5  ALBUMIN 4.1 3.2* 3.5  INR 0.97  --   --    INFECTIOUS  Recent Labs Lab 08/13/14 1650 08/16/14 0900 08/16/14 1500  LATICACIDVEN 0.91 3.0* 3.0*   ENDOCRINE CBG (last 3)   Recent Labs  08/17/14 2349 08/18/14 0328 08/18/14 0730  GLUCAP 135* 128* 134*   IMAGING x48h Dg Chest Port 1 View  08/18/2014   CLINICAL DATA:  68 year old male. Intubated. GI bleeding, myocardial infarction, respiratory failure. Initial encounter.  EXAM: PORTABLE CHEST - 1 VIEW  COMPARISON:  08/17/2014 and earlier.  FINDINGS: Portable AP semi upright view at 0544 hrs.  Stable endotracheal tube. Resuscitation pads in place. Enteric tube looped in the left upper quadrant. Stable left IJ central line. Stable cardiac size and mediastinal contours. No pneumothorax. Mildly regressed basilar predominant interstitial opacity. No large effusion. No definite consolidation. No areas of worsening ventilation.  IMPRESSION: 1.  Stable lines and tubes. 2. Mild regression of interstitial edema. Superimposed lung base atelectasis suspected.   Electronically Signed   By: Lars Pinks M.D.   On: 08/18/2014 07:33   Dg Chest Port 1 View  08/17/2014   CLINICAL DATA:  Check endotracheal tube placement  EXAM: PORTABLE CHEST - 1 VIEW  COMPARISON:  08/16/2014  FINDINGS: Cardiac shadow is stable. An endotracheal tube is again seen 5.5 cm above the carina. A nasogastric catheter is noted within the stomach as well as a left jugular line in the proximal superior vena cava. Patchy changes remain in the right lung base. Some clearing is noted in the right upper lobe. Mild increased atelectasis in the left lung base is noted. No pneumothorax is seen. No sizable effusion is noted.  IMPRESSION: Bibasilar changes right greater than left. The left-sided changes are new from the prior exam.   Electronically Signed   By: Inez Catalina M.D.   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 Hold diureses while hypotensive but will need diureses once hemodynamically more stable. Pulmicort, q4H duoneb. PRN BD.  CARDIOVASCULAR CVL 08/15/14 Left IJ A:  NSTEMI Cardiogenic shock Acute decompensated ischemic CHF Known CAD VTach P:  Per cardiology. Continue levophed. IABP out by noon today.  RENAL A:  Hyponatremia, mild P:   Monitor UOP, BMET. KVO IVF. Replace electrolytes as  indicated. Will need some diureses but not while hemodynamically unstable.  GASTROINTESTINAL A:   Mesenteric ischemia? Possible lower GI bleed on admission, no brisk bleeding now P:   SUP. CT angio abdomen at some point this hospitalization but once more hemodynamically stable. Reconsult GI after cardiac issues resolved. Monitor lactic acid, if rising get CT abdomen sooner.  HEMATOLOGIC A:  Normocytic anemia, Lower gi bleeding? P:  PRBC goal  hgb > 8gm% in setting of MI.  INFECTIOUS A:  HCAP? Fever, infiltrate P:   BCx2 -  8/28 > UC none  Sputum- 8/28 >  Vanc 8/28 > Zosyn 8/28 >  ENDOCRINE A:  No acute issues P:   Monitor glucose  NEUROLOGIC A:  RAS -3 post intubation meds, severe Anxiety P:   RASS goal: 0  Fent gtt PRN versed  TODAY'S SUMMARY: Weaning very well, IABP out today by noon then will likely extubate after.  CC time 35 minutes  Rush Farmer, M.D. [facility] Pulmonary/Critical Care Medicine. Pager: [phone]. After hours pager: [phone].  08/18/2014 9:51 AM

## 2014-08-18 NOTE — Progress Notes (Signed)
[facility] Progress Note Patient Name: Douglas Mann DOB: Aug 01, 1946 MRN: [id number]   Date of Service  08/18/2014  HPI/Events of Note  Mild abd pain post extubation which is not new complaint  eICU Interventions  Norco 5-325 1-2 po every 6 hrs prn     Intervention Category Intermediate Interventions: Pain - evaluation and management  Mauri Brooklyn, P 08/18/2014, 7:44 PM

## 2014-08-18 NOTE — Progress Notes (Signed)
08/18/14 2000  Step #1 Pre-Swallow Screen  History of dysphagia No  Dysphagia diet prior to admission No  Awake and alert, responding to speech? Yes  Positioned upright, with some head control? Yes  Cough on command? Yes  Maintain control of their saliva? Yes  Lick top and bottom lip? Yes  Breathe freely and maintain O2 sats? Yes  Voice quality clear (No "wet" gurgly or hoarse sounds) Yes (only slightly hoarse related to recent extubation)  R Upper  Breath Sounds Expiratory wheezes  L Upper Breath Sounds Expiratory wheezes  R Lower Breath Sounds Diminished;Expiratory wheezes  L Lower Breath Sounds Diminished;Expiratory wheezes  Temp 99.4 F (37.4 C)  Step #2 Swallow Screen  Patient was kept strictly NPO prior to this screen Yes  Water via cup No problems  Water via straw No problems  Lung sounds changed after swallow screen No  Passed swallow screen Yes, see row information

## 2014-08-18 NOTE — Progress Notes (Signed)
Patient anxious, decreased O2 sat with turning in bed for bathing after bowel movement. Assisted to supine position, patient noted to be pursed-lip breathing, shallow respirations, tachypneic. Placed on venturi mask at 405 FiO2, HOB elevated to 30 degrees as be rest ended following IABP removal. Respiratory therapy notified. Will continue to assess and monitor patient closely.

## 2014-08-18 NOTE — Progress Notes (Signed)
IABP d/c'd per protocol .Manual pressure held x 30 min . Pt tol procedure well .VSS. Quarter-sized bruise present At Lt groin sheath entry site--skin very supple. LLE  dorsalis pedis and posterior tibial pulses strong and distinctive w/doppler on low volume .Pressure drsg applied to Lt groin site.

## 2014-08-18 NOTE — Progress Notes (Signed)
Dr. Tamala Julian aware of continued epigastric pain, patient complaints of "feeling like he's not getting enough air." Aware patient anxious, shallow respirations, however patient is not tachypneic. Aware O2 sat 88-92% now on 6L nasal cannula, heart rate remains 110-113. Inspiratory and expiratory wheezes noted. Aware patient adamantly asking for Advair despite previous breathing treatments being given. Patient states "I have to have my Advair or it's not going to work." Patient's daughter states "if he doesn't have his oxygen and his Advair, he's going to crash." No evidence of acute patient distress noted, patient does state that pain is "starting to get a little bit better." Encouraged patient to rest and focus on deep breathing and anxiety reduction, educated patient and patient's daughter regarding pain medications and breathing treatments recently given. No new orders received from Dr. Tamala Julian at this time, will continue to assess and monitor patient closely.

## 2014-08-18 NOTE — Progress Notes (Addendum)
Fentanyl 80 ml wasted in sink---witnessed by 2 RNs .

## 2014-08-18 NOTE — Progress Notes (Signed)
SUBJECTIVE:  Intubated but awake  OBJECTIVE:   Vitals:   Filed Vitals:   08/18/14 0700 08/18/14 0730 08/18/14 0800 08/18/14 0830  BP: 133/51 103/42 111/52 107/52  Pulse: 88 86 47 43  Temp:   99.1 F (37.3 C)   TempSrc:   Oral   Resp: 20 20 20 20   Height:      Weight:      SpO2: 100% 98% 100% 98%   I&O's:   Intake/Output Summary (Last 24 hours) at 08/18/14 3716 Last data filed at 08/18/14 0900  Gross per 24 hour  Intake 2332.03 ml  Output   1755 ml  Net 577.03 ml   TELEMETRY: Reviewed telemetry pt in NSR:     PHYSICAL EXAM General: intubated Head: Eyes PERRLA, No xanthomas.   Normal cephalic and atramatic  Lungs:   Clear bilaterally to auscultation and percussion. Heart:   HRRR S1 S2 Pulses are 2+ & equal. Abdomen: Bowel sounds are positive, abdomen soft and non-tender without masses  Extremities:   No clubbing, cyanosis or edema.  DP +1   LABS: Basic Metabolic Panel:  Recent Labs  08/17/14 0400 08/18/14 0329  NA 134* 132*  K 3.3* 3.3*  CL 99 96  CO2 22 22  GLUCOSE 128* 159*  BUN 20 32*  CREATININE 2.12* 2.47*  CALCIUM 8.0* 8.2*  MG 2.2 2.2  PHOS 1.5* 2.6   Liver Function Tests:  Recent Labs  08/15/14 1957  AST 26  ALT 11  ALKPHOS 83  BILITOT 0.5  PROT 7.5  ALBUMIN 3.5   No results found for this basename: LIPASE, AMYLASE,  in the last 72 hours CBC:  Recent Labs  08/17/14 0400 08/18/14 0329  WBC 14.8* 13.3*  NEUTROABS 11.8* 11.1*  HGB 9.4* 8.7*  HCT 27.2* 24.5*  MCV 85.5 86.6  PLT 170 134*   Cardiac Enzymes:  Recent Labs  08/16/14 1456 08/16/14 2200 08/17/14 0200  TROPONINI >20.00* >20.00* >20.00*   BNP: No components found with this basename: POCBNP,  D-Dimer: No results found for this basename: DDIMER,  in the last 72 hours Hemoglobin A1C: No results found for this basename: HGBA1C,  in the last 72 hours Fasting Lipid Panel: No results found for this basename: CHOL, HDL, LDLCALC, TRIG, CHOLHDL, LDLDIRECT,  in the  last 72 hours Thyroid Function Tests: No results found for this basename: TSH, T4TOTAL, FREET3, T3FREE, THYROIDAB,  in the last 72 hours Anemia Panel: No results found for this basename: VITAMINB12, FOLATE, FERRITIN, TIBC, IRON, RETICCTPCT,  in the last 72 hours Coag Panel:   Lab Results  Component Value Date   INR 0.97 08/13/2014   INR 0.91 09/06/2011    RADIOLOGY: Dg Chest 1 View  08/15/2014   CLINICAL DATA:  rapid response, sob, tachycardia  EXAM: CHEST - 1 VIEW  COMPARISON:  08/15/2014 at 0515 hr  FINDINGS: Interval development of moderate pulmonary edema with perihilar and basilar alveolar edema. Cardiopericardial silhouette partially obscured but grossly unchanged compared to prior. No pleural effusion. No pneumothorax. Old RIGHT distal clavicle fracture.  IMPRESSION: Interval development of moderate bilateral airspace disease consistent with pulmonary edema.   Electronically Signed   By: Dereck Ligas M.D.   On: 08/15/2014 19:58   Ct Abdomen Pelvis W Contrast  08/13/2014   CLINICAL DATA:  GI bleeding.  EXAM: CT ABDOMEN AND PELVIS WITH CONTRAST  TECHNIQUE: Multidetector CT imaging of the abdomen and pelvis was performed using the standard protocol following bolus administration of intravenous contrast.  CONTRAST:  84mL OMNIPAQUE IOHEXOL 300 MG/ML SOLN, 15mL OMNIPAQUE IOHEXOL 300 MG/ML SOLN  COMPARISON:  None.  FINDINGS: The lung bases demonstrate a focal calcification over the post to medial right duct matter border. There is minimal linear scarring over the right base.  Abdominal images demonstrate several calcified splenic granulomas. There is mild cholelithiasis. The liver, pancreas and adrenal glands are within normal. Kidneys normal in size without hydronephrosis or focal mass. There are a couple small calcifications adjacent the left renal hilum likely vascular. Ureters are normal. The appendix is normal. There is diverticulosis throughout the colon including the cecum. There is  calcified plaque involving the abdominal aorta and iliac vessels. There is minimal dilatation of the infrarenal abdominal aorta measuring 2.7 cm in AP diameter.  Pelvic images demonstrate the bladder, prostate and rectum to be within normal. There is no free fluid or inflammatory change noted. Hardware is present over the left proximal femur. There are mild degenerative changes of the spine and hips.  IMPRESSION: No acute findings in the abdomen/pelvis.  Moderate diverticulosis throughout the colon without active inflammation.  Mild cholelithiasis.   Electronically Signed   By: Marin Olp M.D.   On: 08/13/2014 17:52   Dg Chest Port 1 View  08/18/2014   CLINICAL DATA:  68 year old male. Intubated. GI bleeding, myocardial infarction, respiratory failure. Initial encounter.  EXAM: PORTABLE CHEST - 1 VIEW  COMPARISON:  08/17/2014 and earlier.  FINDINGS: Portable AP semi upright view at 0544 hrs. Stable endotracheal tube. Resuscitation pads in place. Enteric tube looped in the left upper quadrant. Stable left IJ central line. Stable cardiac size and mediastinal contours. No pneumothorax. Mildly regressed basilar predominant interstitial opacity. No large effusion. No definite consolidation. No areas of worsening ventilation.  IMPRESSION: 1.  Stable lines and tubes. 2. Mild regression of interstitial edema. Superimposed lung base atelectasis suspected.   Electronically Signed   By: Lars Pinks M.D.   On: 08/18/2014 07:33   Dg Chest Port 1 View  08/17/2014   CLINICAL DATA:  Check endotracheal tube placement  EXAM: PORTABLE CHEST - 1 VIEW  COMPARISON:  08/16/2014  FINDINGS: Cardiac shadow is stable. An endotracheal tube is again seen 5.5 cm above the carina. A nasogastric catheter is noted within the stomach as well as a left jugular line in the proximal superior vena cava. Patchy changes remain in the right lung base. Some clearing is noted in the right upper lobe. Mild increased atelectasis in the left lung base is  noted. No pneumothorax is seen. No sizable effusion is noted.  IMPRESSION: Bibasilar changes right greater than left. The left-sided changes are new from the prior exam.   Electronically Signed   By: Inez Catalina M.D.   On: 08/17/2014 07:24   Dg Chest Port 1 View  08/16/2014   CLINICAL DATA:  Evaluate endotracheal tube  EXAM: PORTABLE CHEST - 1 VIEW  COMPARISON:  08/15/2014  FINDINGS: Endotracheal tube tip about 5.8 cm above the carina. Left central line in unchanged position. NG tube again crosses the gastroesophageal junction. Extensive infiltrate right middle and lower lobe stable. No significant findings on the left.  IMPRESSION: Endotracheal tube as described.  Persistent right-sided infiltrate.   Electronically Signed   By: Skipper Cliche M.D.   On: 08/16/2014 07:37   Dg Chest Port 1 View  08/15/2014   CLINICAL DATA:  Line and tube placement.  EXAM: PORTABLE CHEST - 1 VIEW  COMPARISON:  08/15/2014.  FINDINGS: Interval left jugular catheter with its tip  in the proximal superior vena cava. No pneumothorax. Interval endotracheal tube in satisfactory position. Interval nasogastric tube with its tip in the proximal stomach and side hole in the distal esophagus. Normal sized heart. No significant change in diffuse bilateral interstitial prominence and patchy airspace opacity in the right mid and lower lung zones and medial left lung base. Diffuse osteopenia.  IMPRESSION: 1. Tubes and catheter, as described above. 2. Stable changes of congestive heart failure.   Electronically Signed   By: Enrique Sack M.D.   On: 08/15/2014 22:04   Dg Chest Port 1 View  08/15/2014   CLINICAL DATA:  Chest pain  EXAM: PORTABLE CHEST - 1 VIEW  COMPARISON:  09/06/2011  FINDINGS: Small stable granuloma in the right upper lobe near the minor fissure. There is minor lung base linear/reticular opacity that is most likely atelectasis. Lungs are otherwise clear. No pleural effusion or pneumothorax.  Normal heart, mediastinum and hila.   Bony thorax is demineralized but grossly intact.  IMPRESSION: No active disease.   Electronically Signed   By: Lajean Manes M.D.   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  Acute combined systolic and diastolic HF (heart failure), NYHA class 4: In setting of non-STEMI; LVEDP 45 mmHg  Cardiomyopathy, ischemic: Severe. EF 5 -10% by LV gram - following non-STEMI and ventricular tachycardia  HYPERLIPIDEMIA  GI bleed  HYPERTENSION   1.  Acute pulmonary edema secondary to acute coronary ischemia and LV dysfunction 2.  Acute NSTEMI s/p PCI of LAD under IABP support  -continue ASA/statin/Plavix/Heparin.  - No BB or ACE I secondary to hypotension  3.   DM - on SSI  4. Severe ischemic DCM with EF 10% at cath and by echo yesterday 30-35%. Hemodynamically still hypotensive on IABP on low dose Levophed. IABP at 1:2 at present and will d/c later today and then wean levophed off as BP tolerates. 5. PVD of mesenteric vessels -- once more stable hemodynamically & potentially no longer ischemic, would consider CTA Abdomen to look for Celiac Artery disease for potential of mesenteric ischemia. SMA actually looked relatively normal on aortogram at cath.  6. VDRF - Vent/Sedation & COPD Rx to PCCM -- convert to steroid nebulizer & DuoNeb. Vent wean today and hopefully extubate. 7. Ventricular tachycardia in setting of NSTEMI - no further VT on IV Amio. Will continue IV for now until taking PO  8. Hypokalemia - repleting  9. Acute on  chronic renal insuff. Creatinine continues to trend upwards.  His I&O's are still positive but CVP is 4-7 so continue to hold Lasix for now.  73.  Dyslpidemia - given severe vascular disease will increase to high dose statin Liptor 80mg  daily 11.  Anemia - Hbg trending down some but most likely due to frequent blood draws.  The patient is critically ill with multisystem organ failure requiring extensive monitoring & medication management.     Douglas Margarita, MD  08/18/2014  9:04 AM

## 2014-08-18 NOTE — Progress Notes (Signed)
ANTICOAGULATION CONSULT NOTE - Follow Up Consult  Pharmacy Consult for heparin Indication: IABP  Labs:  Recent Labs  08/15/14 1957 08/16/14 0400  08/16/14 1456 08/16/14 2200 08/17/14 0200 08/17/14 0400 08/17/14 1000 08/17/14 1930 08/18/14 0329 08/18/14 0330  HGB 11.1* 10.0*  --   --   --   --  9.4*  --   --  8.7*  --   HCT 33.7* 30.1*  --   --   --   --  27.2*  --   --  24.5*  --   PLT 223 195  --   --   --   --  170  --   --  134*  --   HEPARINUNFRC  --   --   < >  --   --   --   --  0.15* 0.17*  --  0.24*  CREATININE 1.18 1.24  --   --   --   --  2.12*  --   --   --   --   TROPONINI 1.62*  --   < > >20.00* >20.00* >20.00*  --   --   --   --   --   < > = values in this interval not displayed.   Assessment/Plan:  68yo male therapeutic on heparin after rate increases. Will continue gtt at current rate and confirm stable with additional level.   Wynona Neat, PharmD, BCPS  08/18/2014,4:00 AM

## 2014-08-18 NOTE — Progress Notes (Signed)
[place] for heparin Indication: IABP  No Known Allergies  Patient Measurements: Height: 5\' 7"  (170.2 cm) Weight: 177 lb 7.5 oz (80.5 kg) IBW/kg (Calculated) : 66.1   Vital Signs: Temp: 99.1 F (37.3 C) (08/30 0800) Temp src: Oral (08/30 0800) BP: 102/51 mmHg (08/30 1000) Pulse Rate: 50 (08/30 1000) Intake/Output from previous day: 08/29 0701 - 08/30 0700 In: 2392 [I.V.:1037; NG/GT:1055; IV Piggyback:300] Out: 1593 [Urine:1593] Intake/Output from this shift: Total I/O In: 325.7 [I.V.:110.7; NG/GT:165; IV Piggyback:50] Out: 310 [Urine:310]  Labs:  Recent Labs  08/16/14 0400 08/17/14 0400 08/18/14 0329  WBC 16.6* 14.8* 13.3*  HGB 10.0* 9.4* 8.7*  PLT 195 170 134*  CREATININE 1.24 2.12* 2.47*   Estimated Creatinine Clearance: 28.3 ml/min (by C-G formula based on Cr of 2.47). No results found for this basename: VANCOTROUGH, Corlis Leak, VANCORANDOM, [place], GENTPEAK, [place], [place], TOBRAPEAK, TOBRARND, AMIKACINPEAK, AMIKACINTROU, AMIKACIN,  in the last 72 hours   Microbiology: Recent Results (from the past 720 hour(s))  MRSA PCR SCREENING     Status: None   Collection Time    08/16/14  1:59 AM      Result Value Ref Range Status   MRSA by PCR NEGATIVE  NEGATIVE Final   Comment:            The GeneXpert MRSA Assay (FDA     approved for NASAL specimens     only), is one component of a     comprehensive MRSA colonization     surveillance program. It is not     intended to diagnose MRSA     infection nor to guide or     monitor treatment for     MRSA infections.  CULTURE, RESPIRATORY (NON-EXPECTORATED)     Status: None   Collection Time    08/16/14 11:48 AM      Result Value Ref Range Status   Specimen Description TRACHEAL ASPIRATE   Final   Special Requests NONE   Final   Gram Stain     Final   Value: FEW WBC PRESENT,BOTH PMN AND MONONUCLEAR     RARE SQUAMOUS EPITHELIAL CELLS PRESENT     FEW GRAM POSITIVE COCCI     IN PAIRS  IN CLUSTERS RARE GRAM NEGATIVE RODS     Performed at [facility]   Culture     Final   Value: Culture reincubated for better growth     Performed at [facility]   Report Status PENDING   Incomplete  CULTURE, BLOOD (ROUTINE X 2)     Status: None   Collection Time    08/16/14 12:25 PM      Result Value Ref Range Status   Specimen Description BLOOD RIGHT HAND   Final   Special Requests BOTTLES DRAWN AEROBIC AND ANAEROBIC 5CC   Final   Culture  Setup Time     Final   Value: 08/16/2014 17:16     Performed at [facility]   Culture     Final   Value:        BLOOD CULTURE RECEIVED NO GROWTH TO DATE CULTURE WILL BE HELD FOR 5 DAYS BEFORE ISSUING A FINAL NEGATIVE REPORT     Performed at [facility]   Report Status PENDING   Incomplete  CULTURE, BLOOD (ROUTINE X 2)     Status: None   Collection Time    08/16/14 12:40 PM      Result Value Ref Range Status  Specimen Description BLOOD LEFT HAND   Final   Special Requests BOTTLES DRAWN AEROBIC AND ANAEROBIC 5CC   Final   Culture  Setup Time     Final   Value: 08/16/2014 17:16     Performed at [facility]   Culture     Final   Value:        BLOOD CULTURE RECEIVED NO GROWTH TO DATE CULTURE WILL BE HELD FOR 5 DAYS BEFORE ISSUING A FINAL NEGATIVE REPORT     Performed at [facility]   Report Status PENDING   Incomplete     Assessment: 68 yo male with VDRF s/p VT and NSTEMI. 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 He was also admitted to [facility] 8/25 with concern of GIB (also with history of rectal bleeding) and currently Hg stable. GI has seen and concern for mesenteric ischemia.  Now he is s/p PTCA and stenting of proximal LAD and IABP placement.  Pharmacy consulted to restart heparin drip for the IABP. HL remains therapeutic but has trended down to lower end of goal range. H/H is low and has trended down, plt have trended down. Per nurse, no interruptions in gtt and no reported s/s  bleeding.   Goal of Therapy: Heparin level 0.2 -0.5 for IABP  Plan:  - Increase heparin to 1050 u/hr - Daily HL, CBC - Monitor for s/s bleeding - F/u continuation after IABP removed  Harolyn Rutherford, PharmD Clinical Pharmacist - Resident Pager: [phone] Pharmacy: [phone] 08/18/2014 11:05 AM

## 2014-08-18 NOTE — Progress Notes (Signed)
UR Completed.  Makayla Confer Jane [phone] 08/18/2014  

## 2014-08-18 NOTE — Progress Notes (Signed)
[place] Progress Note Patient Name: Douglas Mann DOB: February 22, 1946 MRN: [id number]   Date of Service  08/18/2014  HPI/Events of Note  Hypokalemia  eICU Interventions  Potassium replaced     Intervention Category Minor Interventions: Electrolytes abnormality - evaluation and management  DETERDING,ELIZABETH 08/18/2014, 4:54 AM

## 2014-08-19 ENCOUNTER — Inpatient Hospital Stay (HOSPITAL_COMMUNITY): Payer: Medicare Other

## 2014-08-19 DIAGNOSIS — N179 Acute kidney failure, unspecified: Secondary | ICD-10-CM

## 2014-08-19 DIAGNOSIS — K625 Hemorrhage of anus and rectum: Secondary | ICD-10-CM

## 2014-08-19 DIAGNOSIS — J9600 Acute respiratory failure, unspecified whether with hypoxia or hypercapnia: Secondary | ICD-10-CM

## 2014-08-19 DIAGNOSIS — R579 Shock, unspecified: Secondary | ICD-10-CM

## 2014-08-19 LAB — CBC WITH DIFFERENTIAL/PLATELET
Basophils Absolute: 0 10*3/uL (ref 0.0–0.1)
Basophils Relative: 0 % (ref 0–1)
Eosinophils Absolute: 0 10*3/uL (ref 0.0–0.7)
Eosinophils Relative: 0 % (ref 0–5)
HCT: 28.2 % — ABNORMAL LOW (ref 39.0–52.0)
Hemoglobin: 9.5 g/dL — ABNORMAL LOW (ref 13.0–17.0)
Lymphocytes Relative: 3 % — ABNORMAL LOW (ref 12–46)
Lymphs Abs: 0.5 10*3/uL — ABNORMAL LOW (ref 0.7–4.0)
MCH: 29.3 pg (ref 26.0–34.0)
MCHC: 33.7 g/dL (ref 30.0–36.0)
MCV: 87 fL (ref 78.0–100.0)
Monocytes Absolute: 1.2 10*3/uL — ABNORMAL HIGH (ref 0.1–1.0)
Monocytes Relative: 7 % (ref 3–12)
Neutro Abs: 16.4 10*3/uL — ABNORMAL HIGH (ref 1.7–7.7)
Neutrophils Relative %: 90 % — ABNORMAL HIGH (ref 43–77)
Platelets: 181 10*3/uL (ref 150–400)
RBC: 3.24 MIL/uL — ABNORMAL LOW (ref 4.22–5.81)
RDW: 14.5 % (ref 11.5–15.5)
WBC: 18.1 10*3/uL — ABNORMAL HIGH (ref 4.0–10.5)

## 2014-08-19 LAB — BASIC METABOLIC PANEL WITH GFR
Anion gap: 14 (ref 5–15)
BUN: 31 mg/dL — ABNORMAL HIGH (ref 6–23)
CO2: 23 meq/L (ref 19–32)
Calcium: 8.5 mg/dL (ref 8.4–10.5)
Chloride: 99 meq/L (ref 96–112)
Creatinine, Ser: 2.22 mg/dL — ABNORMAL HIGH (ref 0.50–1.35)
GFR calc Af Amer: 33 mL/min — ABNORMAL LOW (ref >90)
GFR calc non Af Amer: 28 mL/min — ABNORMAL LOW (ref >90)
Glucose, Bld: 161 mg/dL — ABNORMAL HIGH (ref 70–99)
Potassium: 3.6 meq/L — ABNORMAL LOW (ref 3.7–5.3)
Sodium: 136 meq/L — ABNORMAL LOW (ref 137–147)

## 2014-08-19 LAB — GLUCOSE, CAPILLARY
GLUCOSE-CAPILLARY: 151 mg/dL — AB (ref 70–99)
Glucose-Capillary: 149 mg/dL — ABNORMAL HIGH (ref 70–99)
Glucose-Capillary: 136 mg/dL — ABNORMAL HIGH (ref 70–99)
Glucose-Capillary: 137 mg/dL — ABNORMAL HIGH (ref 70–99)
Glucose-Capillary: 117 mg/dL — ABNORMAL HIGH (ref 70–99)
Glucose-Capillary: 141 mg/dL — ABNORMAL HIGH (ref 70–99)
Glucose-Capillary: 141 mg/dL — ABNORMAL HIGH (ref 70–99)

## 2014-08-19 LAB — PHOSPHORUS: Phosphorus: 4.9 mg/dL — ABNORMAL HIGH (ref 2.3–4.6)

## 2014-08-19 LAB — MAGNESIUM: Magnesium: 2.6 mg/dL — ABNORMAL HIGH (ref 1.5–2.5)

## 2014-08-19 MED ORDER — MILRINONE IN DEXTROSE 20 MG/100ML IV SOLN
0.1250 ug/kg/min | INTRAVENOUS | Status: DC
  Administered 2014-08-19 – 2014-08-20 (×2): 0.125 ug/kg/min via INTRAVENOUS
  Filled 2014-08-19 (×2): qty 100

## 2014-08-19 MED ORDER — FENTANYL BOLUS VIA INFUSION
25.0000 ug | INTRAVENOUS | Status: DC | PRN
  Administered 2014-08-19 (×2): 50 ug via INTRAVENOUS
  Filled 2014-08-19: qty 50

## 2014-08-19 MED ORDER — SODIUM CHLORIDE 0.9 % IV SOLN
0.0000 ug/h | INTRAVENOUS | Status: DC
  Administered 2014-08-19: 150 ug/h via INTRAVENOUS
  Administered 2014-08-19: 50 ug/h via INTRAVENOUS
  Filled 2014-08-19 (×2): qty 50

## 2014-08-19 MED ORDER — FENTANYL CITRATE 0.05 MG/ML IJ SOLN
50.0000 ug | Freq: Once | INTRAMUSCULAR | Status: AC
  Administered 2014-08-19: 50 ug via INTRAVENOUS

## 2014-08-19 MED ORDER — ROCURONIUM BROMIDE 50 MG/5ML IV SOLN
INTRAVENOUS | Status: AC
  Filled 2014-08-19: qty 2

## 2014-08-19 MED ORDER — MIDAZOLAM HCL 2 MG/2ML IJ SOLN
1.0000 mg | INTRAMUSCULAR | Status: DC | PRN
  Administered 2014-08-19: 1 mg via INTRAVENOUS
  Filled 2014-08-19: qty 2

## 2014-08-19 MED ORDER — SUCRALFATE 1 GM/10ML PO SUSP
1.0000 g | Freq: Three times a day (TID) | ORAL | Status: DC
  Administered 2014-08-19 – 2014-08-20 (×3): 1 g
  Filled 2014-08-19 (×7): qty 10

## 2014-08-19 MED ORDER — PANTOPRAZOLE SODIUM 40 MG PO PACK
40.0000 mg | PACK | Freq: Every day | ORAL | Status: DC
  Filled 2014-08-19: qty 20

## 2014-08-19 MED ORDER — MIDAZOLAM HCL 2 MG/2ML IJ SOLN: 1.0000 mg | INTRAMUSCULAR | Status: DC | PRN

## 2014-08-19 MED ORDER — LIDOCAINE HCL (CARDIAC) 20 MG/ML IV SOLN
INTRAVENOUS | Status: AC
  Filled 2014-08-19: qty 5

## 2014-08-19 MED ORDER — MORPHINE SULFATE 2 MG/ML IJ SOLN
INTRAMUSCULAR | Status: AC
  Administered 2014-08-19: 2 mg via INTRAVENOUS
  Filled 2014-08-19: qty 1

## 2014-08-19 MED ORDER — POTASSIUM CHLORIDE 20 MEQ/15ML (10%) PO LIQD
40.0000 meq | Freq: Once | ORAL | Status: AC
  Administered 2014-08-19: 40 meq via ORAL
  Filled 2014-08-19: qty 30

## 2014-08-19 MED ORDER — SUCCINYLCHOLINE CHLORIDE 20 MG/ML IJ SOLN
INTRAMUSCULAR | Status: AC
  Filled 2014-08-19: qty 1

## 2014-08-19 MED ORDER — ETOMIDATE 2 MG/ML IV SOLN
INTRAVENOUS | Status: AC
  Administered 2014-08-19: 20 mg
  Filled 2014-08-19: qty 20

## 2014-08-19 MED ORDER — MORPHINE SULFATE 2 MG/ML IJ SOLN
2.0000 mg | Freq: Once | INTRAMUSCULAR | Status: AC
  Administered 2014-08-19: 2 mg via INTRAVENOUS

## 2014-08-19 MED ORDER — TIOTROPIUM BROMIDE MONOHYDRATE 18 MCG IN CAPS
18.0000 ug | ORAL_CAPSULE | Freq: Every day | RESPIRATORY_TRACT | Status: DC
  Filled 2014-08-19: qty 5

## 2014-08-19 MED ORDER — MIDAZOLAM HCL 2 MG/2ML IJ SOLN
INTRAMUSCULAR | Status: AC
  Administered 2014-08-19: 2 mg via INTRAVENOUS
  Filled 2014-08-19: qty 2

## 2014-08-19 MED ORDER — FENTANYL CITRATE 0.05 MG/ML IJ SOLN
INTRAMUSCULAR | Status: AC
  Filled 2014-08-19: qty 2

## 2014-08-19 MED ORDER — IPRATROPIUM-ALBUTEROL 0.5-2.5 (3) MG/3ML IN SOLN
3.0000 mL | RESPIRATORY_TRACT | Status: DC | PRN
Start: 2014-08-19 — End: 2014-08-19

## 2014-08-19 MED ORDER — FOLIC ACID 5 MG/ML IJ SOLN
1.0000 mg | Freq: Every day | INTRAMUSCULAR | Status: DC
  Filled 2014-08-19 (×2): qty 0.2

## 2014-08-19 MED ORDER — INSULIN ASPART 100 UNIT/ML ~~LOC~~ SOLN
0.0000 [IU] | SUBCUTANEOUS | Status: DC
  Administered 2014-08-19 (×2): 2 [IU] via SUBCUTANEOUS
  Administered 2014-08-20: 3 [IU] via SUBCUTANEOUS
  Administered 2014-08-20 – 2014-08-21 (×3): 2 [IU] via SUBCUTANEOUS
  Administered 2014-08-22: 3 [IU] via SUBCUTANEOUS
  Administered 2014-08-22: 2 [IU] via SUBCUTANEOUS
  Administered 2014-08-22 (×2): 3 [IU] via SUBCUTANEOUS
  Administered 2014-08-23: 2 [IU] via SUBCUTANEOUS
  Administered 2014-08-23: 3 [IU] via SUBCUTANEOUS

## 2014-08-19 MED ORDER — IPRATROPIUM-ALBUTEROL 0.5-2.5 (3) MG/3ML IN SOLN
3.0000 mL | Freq: Four times a day (QID) | RESPIRATORY_TRACT | Status: DC
  Administered 2014-08-19 – 2014-08-22 (×12): 3 mL via RESPIRATORY_TRACT
  Filled 2014-08-19 (×12): qty 3

## 2014-08-19 MED ORDER — ARFORMOTEROL TARTRATE 15 MCG/2ML IN NEBU
15.0000 ug | INHALATION_SOLUTION | Freq: Two times a day (BID) | RESPIRATORY_TRACT | Status: DC
  Administered 2014-08-19 (×2): 15 ug via RESPIRATORY_TRACT
  Filled 2014-08-19 (×4): qty 2

## 2014-08-19 MED ORDER — ALPRAZOLAM 0.25 MG PO TABS
0.2500 mg | ORAL_TABLET | Freq: Once | ORAL | Status: AC
  Administered 2014-08-19: 0.25 mg via ORAL
  Filled 2014-08-19: qty 1

## 2014-08-19 MED ORDER — THIAMINE HCL 100 MG/ML IJ SOLN
100.0000 mg | Freq: Every day | INTRAMUSCULAR | Status: DC
  Filled 2014-08-19 (×2): qty 1

## 2014-08-19 MED ORDER — ASPIRIN 81 MG PO CHEW: 81.0000 mg | CHEWABLE_TABLET | Freq: Every day | ORAL | Status: DC

## 2014-08-19 MED ORDER — BUDESONIDE 0.5 MG/2ML IN SUSP
0.5000 mg | Freq: Two times a day (BID) | RESPIRATORY_TRACT | Status: DC
  Administered 2014-08-19 – 2014-09-03 (×31): 0.5 mg via RESPIRATORY_TRACT
  Filled 2014-08-19 (×34): qty 2

## 2014-08-19 MED FILL — Lidocaine HCl Local Preservative Free (PF) Inj 1%: INTRAMUSCULAR | Qty: 30 | Status: AC

## 2014-08-19 MED FILL — Sodium Chloride IV Soln 0.9%: INTRAVENOUS | Qty: 50 | Status: AC

## 2014-08-19 MED FILL — Heparin Sodium (Porcine) 2 Unit/ML in Sodium Chloride 0.9%: INTRAMUSCULAR | Qty: 500 | Status: AC

## 2014-08-19 MED FILL — Heparin Sodium (Porcine) Inj 1000 Unit/ML: INTRAMUSCULAR | Qty: 10 | Status: AC

## 2014-08-19 NOTE — Progress Notes (Signed)
[place] PCCM Note  Called to evaluate Douglas Mann for evolving respiratory distress. He was extubated 8/30, had performed well on SBT post-removal of IABP. He has become more tachycardic, tachypneic, hypoxemia this shift.   Filed Vitals:   08/19/14 0115 08/19/14 0130 08/19/14 0145 08/19/14 0155  BP: 154/110 139/85 110/84   Pulse: 134 136 135 134  Temp: 99.1 F (37.3 C) 99.3 F (37.4 C) 99.3 F (37.4 C) 99.3 F (37.4 C)  TempSrc:      Resp: 24 25 26 26   Height:      Weight:      SpO2: 93% 90% 89% 85%   Gen: Ill appearing elderly man  ENT: No lesions, OP clear, edentulous  Neck: loud exp stridor  Lungs: rapid shallow breaths, accessory muscle use, B wheeze on exp  Cardiovascular: tachycardic, regular, no M, no peripheral edema  Musculoskeletal: No deformities, no cyanosis or clubbing  Neuro: awake, able to speak and answer questions but confused, poor insight into situation. Moves all ext  Skin: Warm, no lesions or rashes   Recent Labs Lab 08/16/14 0142 08/16/14 0253 08/17/14 0306 08/17/14 0425 08/17/14 1030 08/18/14 0324  PHART 7.238* 7.316* 7.508* 7.405  --  7.422  PCO2ART 49.9* 37.1 25.6* 33.7*  --  36.0  PO2ART 314.0* 59.0* 101.0* 80.0  --  83.2  HCO3 21.6 19.2* 20.3 21.1  --  23.0  TCO2 23 20 21 22   --  24.2  O2SAT 100.0 90.0 99.0 96.0 90.2 96.1    Recent Labs Lab 08/15/14 0400 08/15/14 1957 08/15/14 2144 08/16/14 0400 08/17/14 0400 08/18/14 0329  NA 136* 136*  --  135* 134* 132*  K 4.7 4.3  --  4.4 3.3* 3.3*  CL 101 98  --  99 99 96  CO2 24 21  --  19 22 22   GLUCOSE 113* 254*  --  188* 128* 159*  BUN 8 10  --  13 20 32*  CREATININE 1.03 1.18  --  1.24 2.12* 2.47*  CALCIUM 9.0 9.1  --  8.1* 8.0* 8.2*  MG  --   --  1.9 4.9* 2.2 2.2  PHOS  --   --  4.8* 3.9 1.5* 2.6    Recent Labs Lab 08/16/14 0400 08/17/14 0400 08/18/14 0329  HGB 10.0* 9.4* 8.7*  HCT 30.1* 27.2* 24.5*  WBC 16.6* 14.8* 13.3*  PLT 195 170 134*   Portable CXR 8/31 --   COMPARISON: 08/18/2014 FINDINGS: Left IJ central venous catheter tip projects over the brachiocephalic/ SVC confluence. Bilateral perihilar interstitial and hazy airspace opacities. More confluent right lung base opacity and small pleural effusions not excluded. No pneumothorax. Central vascular congestion. Heart size upper normal. Osteopenia and multilevel degenerative changes. IMPRESSION: Central vascular congestion and peri-/infrahilar interstitial and airspace opacity may reflect pulmonary edema or multifocal infection. More confluent right lung base opacity; atelectasis versus infiltrate. Small effusions not excluded.   Impression:  Recurrent respiratory failure - multifactorial, with components of decompensated cardiomyopathy and evolving pulm edema, underlying severe COPD, UA obstruction. Consider also evolving HCAP with R>L evolving infiltrates on CXR (treated).  His insight into current situation is poor. I have spoken with his daughter Vanita Ingles who understands that he will need MV support to survive. She is in favor of intubation to see if a few more days of MV support allows him an overall recovery. She understands and agrees with Douglas Buss's previous statements that he would not want extended period of MV, trach, etc.  Cardiogenic shock  Ischemic CM Acute renal failure  Plans: - ET intubation now - sedation per protocol - will need to consider diuresis, although note that he has evolving renal injury - agree with empiric vanco and pip/tazo - restart norepi and titrate  45 minutes CC time.    [name], MD, PhD 08/19/2014, 2:44 AM [facility] [phone] or if no answer [phone]

## 2014-08-19 NOTE — Procedures (Signed)
Intubation Procedure Note Douglas Mann [id number] Feb 21, 1946  Procedure: Intubation Indications: Respiratory insufficiency  Procedure Details Consent: Risks of procedure as well as the alternatives and risks of each were explained to the (patient/caregiver).  Consent for procedure obtained. Time Out: Verified patient identification, verified procedure, site/side was marked, verified correct patient position, special equipment/implants available, medications/allergies/relevent history reviewed, required imaging and test results available.  Performed  Maximum sterile technique was used including gloves, hand hygiene and mask.  MAC and 3 8.0 ETT placed without complication using Mac-3. Placement confirmed by ETCO2, direct visualization.  No secretions noted   Evaluation Hemodynamic Status: Transient hypotension treated with pressors; O2 sats: stable throughout Patient's Current Condition: stable Complications: No apparent complications Patient did tolerate procedure well. Chest X-ray ordered to verify placement.  CXR: pending.   [name], MD, PhD 08/19/2014, 2:46 AM [facility] [phone] or if no answer [phone]

## 2014-08-19 NOTE — Progress Notes (Signed)
Pharmacy Consult for Milrinone (Primacor) Initiation  Indication:   Acute Decompensated Heart Failure with volume overload and low cardiac output  No Known Allergies  Temp:  [97.7 F (36.5 C)-99.9 F (37.7 C)] 99 F (37.2 C) (08/31 1200) Pulse Rate:  [42-136] 95 (08/31 1223) Cardiac Rhythm:  [-] Normal sinus rhythm (08/31 1200) Resp:  [12-28] 23 (08/31 1223) BP: (66-154)/(48-110) 113/69 mmHg (08/31 1223) SpO2:  [83 %-100 %] 100 % (08/31 1223) FiO2 (%):  [30 %-100 %] 40 % (08/31 1223) Weight:  [174 lb 2.6 oz (79 kg)] 174 lb 2.6 oz (79 kg) (08/31 0358)  LABS    Component Value Date/Time   NA 136* 08/19/2014 0330   K 3.6* 08/19/2014 0330   CL 99 08/19/2014 0330   CO2 23 08/19/2014 0330   GLUCOSE 161* 08/19/2014 0330   BUN 31* 08/19/2014 0330   CREATININE 2.22* 08/19/2014 0330   CALCIUM 8.5 08/19/2014 0330   GFRNONAA 28* 08/19/2014 0330   GFRAA 33* 08/19/2014 0330   Last magnesium:  Lab Results  Component Value Date   MG 2.6* 08/19/2014   Estimated Creatinine Clearance: 28.9 ml/min (by C-G formula based on Cr of 2.22). CREATININE: 2.22 mg/dL ABNORMAL (08/19/14 0330) Estimated creatinine clearance - Cockcroft-Gault CrCl: 28.9 mL/min estimated creatinine clearance is 28.9 ml/min (by C-G formula based on Cr of 2.22).   Intake/Output Summary (Last 24 hours) at 08/19/14 1322 Last data filed at 08/19/14 1300  Gross per 24 hour  Intake 1530.73 ml  Output   1422 ml  Net 108.73 ml    Filed Weights   08/17/14 0500 08/18/14 0357 08/19/14 0358  Weight: 171 lb 11.8 oz (77.9 kg) 177 lb 7.5 oz (80.5 kg) 174 lb 2.6 oz (79 kg)    Assessment:  68 y.o. male admitted 08/13/2014 and NSTEMI s/p IABP now removed. Patient with VDRF and acute decompensated congestive heart failure on levophed to be initiated on milrinone. Patient with EF 30-35%. Potassium= 3.6, Magnesium= 2.6, SCr=2.22 and CrCl ~ 30.  Plan is to initiate milrinone for inotropic support.  Plan:  1. Initiate milrinone based on  renal function:  -Will start with lower dose due to pressure support with levophed (Consider starting dose of 0.125 - 0.25 for patients with SBP <125mmHg) [employer] [facility] Calculated CrCl Dose  []  > 50 ml/min 0.375 mcg/kg/min  []  20-49 ml/min 0.250 mcg/kg/min  [x]  < 20 ml/min 0.125 mcg/kg/min   2. Potassium 33meq x1 (discussed with Dr. Tamala Julian) 3. Nursing to monitor vital signs per milrinone protocol and physician parameters. 4. Pharmacy to follow peripherally, please reconsult if needed or there is further questions. 5.  Please contact MD for further dosing instructions.  Thank you for allowing [place] to be a part of this patient's care.  Hildred Laser, Pharm D 08/19/2014 1:27 PM

## 2014-08-19 NOTE — Progress Notes (Signed)
Dr. Halford Chessman notified that patient angry, agitated, yelling at staff stating "If you don't give me my Advair, I'm going to die!" Patient yelling that he has been "waiting for three hours to get his Advair." Patient educated regarding previous nebulizer treatments given, reassurance provided regarding respiratory status, O2 sats. Dr. Halford Chessman discussed Advair and breathing treatments with patient via eICU camera, patient continues to state "you need to call my daughter and tell her to bring my Advair to me. I can't breathe." Dr. Halford Chessman aware patient remains anxious, tachycardic, aware patient on venturi mask at this time. Orders received, respiratory therapy notified. Will continue to assess and monitor patient closely.

## 2014-08-19 NOTE — Progress Notes (Addendum)
Dr. Lamonte Sakai aware of hypotension post intubation and also aware of low urine output. Aware fentanyl gtt not yet started but intervals of vent asynchrony continue. Aware of levophed gtt rate. No orders received at this time, will continue to assess and monitor patient closely.

## 2014-08-19 NOTE — Progress Notes (Signed)
       Patient Name: Douglas Mann Date of Encounter: 08/19/2014    SUBJECTIVE: Intubated. He denies dyspnea. He denies chest pain.  TELEMETRY:  Rare salvos of nonsustained VT Filed Vitals:   08/19/14 1000 08/19/14 1100 08/19/14 1200 08/19/14 1223  BP: 99/64 111/70 107/65 113/69  Pulse: 85 91 86 95  Temp: 98.6 F (37 C) 98.8 F (37.1 C) 99 F (37.2 C)   TempSrc:      Resp: 13 17 12 23   Height:      Weight:      SpO2: 99% 100% 99% 100%    Intake/Output Summary (Last 24 hours) at 08/19/14 1233 Last data filed at 08/19/14 1200  Gross per 24 hour  Intake 1577.75 ml  Output   1617 ml  Net -39.25 ml   LABS: Basic Metabolic Panel:  Recent Labs  08/18/14 0329 08/19/14 0330  NA 132* 136*  K 3.3* 3.6*  CL 96 99  CO2 22 23  GLUCOSE 159* 161*  BUN 32* 31*  CREATININE 2.47* 2.22*  CALCIUM 8.2* 8.5  MG 2.2 2.6*  PHOS 2.6 4.9*   CBC:  Recent Labs  08/18/14 0329 08/19/14 0330  WBC 13.3* 18.1*  NEUTROABS 11.1* 16.4*  HGB 8.7* 9.5*  HCT 24.5* 28.2*  MCV 86.6 87.0  PLT 134* 181   Cardiac Enzymes:  Recent Labs  08/16/14 1456 08/16/14 2200 08/17/14 0200  TROPONINI >20.00* >20.00* >20.00*   BNP    Component Value Date/Time   PROBNP 2319.0* 08/15/2014 1956      Radiology/Studies:  CHEST x-ray: IMPRESSION:  Endotracheal tube tip 1.2 cm proximal to the carina. Consider  retracting 1-2 cm.  Bilateral interstitial and hazy airspace opacities may reflect  pulmonary edema and/or multifocal infection.  Small effusions and associated airspace opacities ; atelectasis  versus infiltrate.   Physical Exam: Blood pressure 113/69, pulse 95, temperature 99 F (37.2 C), temperature source Core (Comment), resp. rate 23, height 5\' 7"  (1.702 m), weight 174 lb 2.6 oz (79 kg), SpO2 100.00%. Weight change: -3 lb 4.9 oz (-1.5 kg)  Wt Readings from Last 3 Encounters:  08/19/14 174 lb 2.6 oz (79 kg)  08/19/14 174 lb 2.6 oz (79 kg)  08/19/14 174 lb 2.6 oz (79 kg)     Awake and alert. Moderate JVD with CVP of 12. Clear lungs anteriorly S3 gallop No edema  ASSESSMENT:  1. Cardiogenic shock requiring vasopressor therapy for blood pressure support. BP is relatively stable and Levophed is being weaned 2. Respiratory failure, likely multifactorial with acute on chronic systolic heart failure and COPD he has major contributors. After intra-aortic balloon pump removal the patient required reintubation. 3. CAD with occlusion of RCA and recent LAD stent performed with intraorally balloon pump support 4. Acute on chronic kidney injury  Plan:  Overall clinical condition is very poor with an extremely limited prognosis.  Short-term measures could include inotropic support (milrinone). Not really a candidate for reinsertion of intra-aortic balloon pump.  Will discuss with critical care team our goals of care. Perhaps hospice and palliative care is the most prudent and honest approach.  [name] 08/19/2014, 12:33 PM

## 2014-08-19 NOTE — Progress Notes (Addendum)
Dr. Halford Chessman made aware patient tachycardic with heart rate 135-140, tachypneic, diaphoretic. increased work of breathing, accessory muscle use noted, increased agitation. Aware Xanax 0.25mg  PO recently given, see MAR. Respiratory therapy at bedside attempting to place patient on BiPap, patient becoming increasingly anxious with bipap mask in place, pulling mask off yelling "that's not going to work, give me the other mask." Reassurance provided, encouraging patient to focus on deep breathing. Orders received for stat CXR and morphine 2mg  IV push . Patient's daughter Vanita Ingles called and notified of patient's respiratory distress. Will continue to monitor. Per Dr. Halford Chessman, Dr. Lamonte Sakai en route to 2H10 to assess patient.

## 2014-08-19 NOTE — Progress Notes (Signed)
[place] Progress Note Patient Name: Douglas Mann DOB: 30-Jan-1946 MRN: [id number]   Date of Service  08/19/2014  HPI/Events of Note  Pt with progressive respiratory distress, tachycardia, hypertension, hypoxia.  Given xanax, morphine.  Unable to tolerate BiPAP.   eICU Interventions  Stat CXR ordered.  Called in house CCM MD to assess for possible intubation.      Intervention Category Major Interventions: Other:  Kioni Stahl 08/19/2014, 1:25 AM

## 2014-08-19 NOTE — Progress Notes (Signed)
[place] Progress Note Patient Name: Douglas Mann DOB: [birth year] MRN: [id number]   Date of Service  08/19/2014  HPI/Events of Note  Pt extubated earlier in the day.  He has hx of COPD and uses advair at home.  He c/o dyspnea, and requests to use advair.  eICU Interventions  D/w pt over camera that nebulizer medication likely better option for him at this time.  Will increase dose of pulmicort, add brovana and spiriva, and change duoneb to prn.  Will also add prn BiPAP.     Intervention Category Major Interventions: Other:  Lashena Signer 08/19/2014, 12:42 AM

## 2014-08-19 NOTE — Progress Notes (Signed)
Patient is refusing to be turned or repositioned from side to side. He is able to communicate needs in writing, gestures and head nods. He is alert and appears to be oriented. I explained to him the importance of turning to prevent skin break down and pressure ulcers. He acknowledges understanding. He is shifting from side to side and he states that he will continue to do this. He has also refused the application of prescribed creams as documented on the [facility]. He also refuses a bath but did allow me to perform foley care with wipes per protocol. Will continue to offer turning and a bath.

## 2014-08-19 NOTE — Progress Notes (Signed)
PULMONARY / CRITICAL CARE MEDICINE   Name: Douglas Mann MRN: [id number] DOB: 1946/03/06    ADMISSION DATE:  08/13/2014 CONSULTATION DATE:  08/15/2014   REFERRING MD :   [facility]  INITIAL PRESENTATION:   68 y/o male admitted on 8/25 for BRBPR and abdominal pain who developed chest pain, NSTEMI, VT on 8/27.  He was intubated and went to the cath lab where he was found to have a markedly elevated LVEDP.  EVENTS  8/25 Admitted with complaints of abd pain, BRBPR 8/25 CT abs/pelvis: no acute findings 8/27 Developed CP with c/o dysphagia 8/27 Echocardiogram: The cavity size was normal. Wall thickness was increased in a pattern of mild LVH. Systolic function was normal. The estimated ejection fraction was in the range of 55% to 60%. Wall motion was normal; there were no regional wall motion abnormalities. The LA was mildly dilated 8/27 Cards consult 8/27 developed resp distress, diaphoresis, CP, pulm edema. Positive cardiac markers 8/27 PCCM consult and intubation 8/28 early AM: urgent cath: Severe 2 vessel CAD with 100% occluded RCA, LAD 90% stenosed, severe ischemic cardiomyopathy LVEF 5-10% 8/28 AM IABP placed 8/28 Echocardiogram: LVEF 30-35% 8/28 GI consult: Concern for possible chronic mesenteric ischemia. Consider CTA abdomen/pelvis when clinically stable 8/30 IABP removed and extubated 8/31 early AM: re-intubated. Edema pattern on CXR  SUBJECTIVE:  Sedated, intubated  VITAL SIGNS: Temp:  [97.7 F (36.5 C)-99.9 F (37.7 C)] 99.3 F (37.4 C) (08/31 1345) Pulse Rate:  [78-136] 84 (08/31 1345) Resp:  [12-28] 12 (08/31 1345) BP: (66-154)/(48-110) 96/62 mmHg (08/31 1345) SpO2:  [83 %-100 %] 97 % (08/31 1345) FiO2 (%):  [30 %-100 %] 40 % (08/31 1223) Weight:  [79 kg (174 lb 2.6 oz)] 79 kg (174 lb 2.6 oz) (08/31 0358)  HEMODYNAMICS: CVP:  [10 mmHg-16 mmHg] 10 mmHg  VENTILATOR SETTINGS: Vent Mode:  [-] PRVC FiO2 (%):  [30 %-100 %] 40 % Set Rate:  [14 bmp] 14 bmp Vt Set:  [10 mL-550  mL] 550 mL PEEP:  [5 cmH20-6 cmH20] 5 cmH20 Plateau Pressure:  [20 IDP82-42 cmH20] 27 cmH20  INTAKE / OUTPUT:  Intake/Output Summary (Last 24 hours) at 08/19/14 1437 Last data filed at 08/19/14 1300  Gross per 24 hour  Intake 1499.33 ml  Output   1421 ml  Net  78.33 ml   PHYSICAL EXAMINATION: General:  Sedated and intubated HEENT: NCAT, ETT PULM: rhonchi bilaterally CV: distant heart sounds, RRR AB: BS+, soft Ext: cool, no edema Neuro: awake, alert, comfortable  LABS: I have reviewed all of today's lab results. Relevant abnormalities are discussed in the A/P section   CXR: edema pattern  ASSESSMENT / PLAN:  PULMONARY ETT 8/27 >> 8/30, 8/31 >>  A:  Acute Respiratory Failure Recurrent Acute Pulmonary Edema  COPD, O2 dependent, not in exacerbation P:   Cont full vent support - settings reviewed and/or adjusted Cont vent bundle Daily SBT if/when meets criteria Cont nebulized steroids and BDs  CARDIOVASCULAR 8/27 Left IJ CVL 8/27 >>  A:  Acute MI  Cardiogenic shock Severe ischemic cardiomyopathy VT 8/27 P:  Mgmt per Cards  RENAL A:  Hyponatremia, resolved AKI, nonoliguric  P:   Monitor BMET intermittently Monitor I/Os Correct electrolytes as indicated  GASTROINTESTINAL A:   Chronic intermittent abd pain, concern for mesenteric ischemia Hematochezia, reolved P:   SUP: enteral pantoprazole + sucralfate Cont TFs Consider CTA abdomen/pelvis when clinically stable  HEMATOLOGIC A:   Acute blood loss anemia, no active bleeding currently P:  DVT px:  SCDs (due to bleeding) Monitor CBC intermittently Transfuse per usual ICU guidelines  INFECTIOUS A:   No overt infectious process identified P:   Blood 8/28 >> NEG Sputum 8/28 >> NOF  Vanc 8/28 >> 8/31 Zosyn 8/28 >> 8/31  ENDOCRINE A:   Stress assoc hyperglycemia without prior dx of DM P:   Mod scale SSI  NEUROLOGIC A:   Acute ICU assoc encephalopathy P:   Cont PAD protocol  TODAY'S  SUMMARY:    CC time 8 minutes  Merton Border, MD ; [facility] service Mobile [phone].  After 5:30 PM or weekends, call [phone]   08/19/2014 2:37 PM

## 2014-08-19 NOTE — Progress Notes (Signed)
RT Note: Tried pt on Bipap due to complaints on "he cant breathe". Placed him on NIV 12/6 40%. Pt did not tolerate and asked for the other mask back. Pt back on 35% VM SpO2 92%, HR 130's.

## 2014-08-19 NOTE — Progress Notes (Signed)
Dr. Lamonte Sakai at bedside to assess patient, aware of respiratory distress with accessory muscle use, aware of O2 sat 86-90%, heart rate 135-140, diaphoresis. CXR completed, expiratory wheezes continue. Patient continues asking for Advair. Dr. Lamonte Sakai speaking with patient's daughter via telephone at this time. Non-sustained run of vtach noted, stable BP noted. Will continue to assess and monitor patient closely.

## 2014-08-20 ENCOUNTER — Inpatient Hospital Stay (HOSPITAL_COMMUNITY): Payer: Medicare Other

## 2014-08-20 DIAGNOSIS — N179 Acute kidney failure, unspecified: Secondary | ICD-10-CM

## 2014-08-20 LAB — CBC
HCT: 25.1 % — ABNORMAL LOW (ref 39.0–52.0)
Hemoglobin: 8.5 g/dL — ABNORMAL LOW (ref 13.0–17.0)
MCH: 29.3 pg (ref 26.0–34.0)
MCHC: 33.9 g/dL (ref 30.0–36.0)
MCV: 86.6 fL (ref 78.0–100.0)
PLATELETS: 158 10*3/uL (ref 150–400)
RBC: 2.9 MIL/uL — ABNORMAL LOW (ref 4.22–5.81)
RDW: 14.6 % (ref 11.5–15.5)
WBC: 13 10*3/uL — ABNORMAL HIGH (ref 4.0–10.5)

## 2014-08-20 LAB — BASIC METABOLIC PANEL
Anion gap: 11 (ref 5–15)
BUN: 26 mg/dL — AB (ref 6–23)
CO2: 24 mEq/L (ref 19–32)
Calcium: 8.6 mg/dL (ref 8.4–10.5)
Chloride: 102 mEq/L (ref 96–112)
Creatinine, Ser: 1.86 mg/dL — ABNORMAL HIGH (ref 0.50–1.35)
GFR calc Af Amer: 41 mL/min — ABNORMAL LOW (ref >90)
GFR, EST NON AFRICAN AMERICAN: 35 mL/min — AB (ref >90)
GLUCOSE: 158 mg/dL — AB (ref 70–99)
Potassium: 3.3 mEq/L — ABNORMAL LOW (ref 3.7–5.3)
Sodium: 137 mEq/L (ref 137–147)

## 2014-08-20 LAB — PRO B NATRIURETIC PEPTIDE: Pro B Natriuretic peptide (BNP): 51736 pg/mL — ABNORMAL HIGH (ref 0–125)

## 2014-08-20 LAB — GLUCOSE, CAPILLARY
Glucose-Capillary: 162 mg/dL — ABNORMAL HIGH (ref 70–99)
Glucose-Capillary: 150 mg/dL — ABNORMAL HIGH (ref 70–99)
Glucose-Capillary: 113 mg/dL — ABNORMAL HIGH (ref 70–99)
Glucose-Capillary: 117 mg/dL — ABNORMAL HIGH (ref 70–99)
Glucose-Capillary: 113 mg/dL — ABNORMAL HIGH (ref 70–99)
Glucose-Capillary: 115 mg/dL — ABNORMAL HIGH (ref 70–99)

## 2014-08-20 LAB — TROPONIN I: Troponin I: 11.82 ng/mL (ref <0.30)

## 2014-08-20 MED ORDER — FENTANYL CITRATE 0.05 MG/ML IJ SOLN
12.5000 ug | INTRAMUSCULAR | Status: DC | PRN
  Administered 2014-08-20: 25 ug via INTRAVENOUS
  Filled 2014-08-20: qty 2

## 2014-08-20 MED ORDER — PANTOPRAZOLE SODIUM 40 MG PO TBEC: 40.0000 mg | DELAYED_RELEASE_TABLET | Freq: Every day | ORAL | Status: DC

## 2014-08-20 MED ORDER — POTASSIUM CHLORIDE 20 MEQ/15ML (10%) PO LIQD
40.0000 meq | Freq: Once | ORAL | Status: AC
  Administered 2014-08-20: 40 meq
  Filled 2014-08-20 (×3): qty 30

## 2014-08-20 MED ORDER — FUROSEMIDE 10 MG/ML IJ SOLN
40.0000 mg | Freq: Once | INTRAMUSCULAR | Status: AC
  Administered 2014-08-20: 40 mg via INTRAVENOUS
  Filled 2014-08-20: qty 4

## 2014-08-20 MED ORDER — ASPIRIN 81 MG PO CHEW
81.0000 mg | CHEWABLE_TABLET | Freq: Every day | ORAL | Status: DC
  Administered 2014-08-20 – 2014-09-03 (×15): 81 mg via ORAL
  Filled 2014-08-20 (×15): qty 1

## 2014-08-20 MED ORDER — POTASSIUM CHLORIDE 20 MEQ/15ML (10%) PO LIQD
40.0000 meq | ORAL | Status: DC
  Administered 2014-08-20: 40 meq
  Filled 2014-08-20: qty 30

## 2014-08-20 MED ORDER — CLOPIDOGREL BISULFATE 75 MG PO TABS
75.0000 mg | ORAL_TABLET | Freq: Every day | ORAL | Status: DC
  Administered 2014-08-21 – 2014-09-03 (×14): 75 mg via ORAL
  Filled 2014-08-20 (×14): qty 1

## 2014-08-20 MED ORDER — CETYLPYRIDINIUM CHLORIDE 0.05 % MT LIQD: 7.0000 mL | Freq: Two times a day (BID) | OROMUCOSAL | Status: DC

## 2014-08-20 MED ORDER — CETYLPYRIDINIUM CHLORIDE 0.05 % MT LIQD
7.0000 mL | Freq: Two times a day (BID) | OROMUCOSAL | Status: DC
  Administered 2014-08-20 – 2014-08-24 (×5): 7 mL via OROMUCOSAL

## 2014-08-20 MED ORDER — FUROSEMIDE 10 MG/ML IJ SOLN: 40.0000 mg | Freq: Once | INTRAMUSCULAR | Status: AC

## 2014-08-20 MED ORDER — ATORVASTATIN CALCIUM 80 MG PO TABS
80.0000 mg | ORAL_TABLET | Freq: Every day | ORAL | Status: DC
  Administered 2014-08-20 – 2014-09-02 (×13): 80 mg via ORAL
  Filled 2014-08-20 (×16): qty 1

## 2014-08-20 NOTE — Progress Notes (Signed)
PULMONARY / CRITICAL CARE MEDICINE   Name: Douglas Mann MRN: [id number] DOB: [birth year]    ADMISSION DATE:  08/13/2014 CONSULTATION DATE:  08/15/2014   REFERRING MD :   [facility]  INITIAL PRESENTATION:   68 y/o male admitted on 8/25 for BRBPR and abdominal pain who developed chest pain, NSTEMI, VT on 8/27.  He was intubated and went to the cath lab where he was found to have a markedly elevated LVEDP.  EVENTS  8/25 Admitted with complaints of abd pain, BRBPR 8/25 CT abs/pelvis: no acute findings 8/27 Developed CP with c/o dysphagia 8/27 Echocardiogram: The cavity size was normal. Wall thickness was increased in a pattern of mild LVH. Systolic function was normal. The estimated ejection fraction was in the range of 55% to 60%. Wall motion was normal; there were no regional wall motion abnormalities. The LA was mildly dilated 8/27 Cards consult 8/27 developed resp distress, diaphoresis, CP, pulm edema. Positive cardiac markers 8/27 PCCM consult and intubation 8/28 early AM: urgent cath: Severe 2 vessel CAD with 100% occluded RCA, LAD 90% stenosed, severe ischemic cardiomyopathy LVEF 5-10% 8/28 AM IABP placed 8/28 Echocardiogram: LVEF 30-35% 8/28 GI consult: Concern for possible chronic mesenteric ischemia. Consider CTA abdomen/pelvis when clinically stable 8/30 IABP removed and extubated 8/31 early AM: re-intubated. Edema pattern on CXR. Milrinone initiated by Cardiology 9/01 Extubated. Remained slightly encephalopathic due to residual sedating medications. Discussed re-intubation status with pt (who did not seem to fully grasp the conversation) and his daughter who feels certain that he would not wish to undergo repeat intubation or ACLS in this situation where there are very limited treatment options and where recurrent respiratory fialure would be indicative of failure of those few options  SUBJECTIVE:  RASS -1. Passed SBT. Extubated and tolerating initially  VITAL SIGNS: Temp:  [99 F  (37.2 C)-100.2 F (37.9 C)] 99.7 F (37.6 C) (09/01 1315) Pulse Rate:  [87-122] 100 (09/01 1315) Resp:  [11-32] 16 (09/01 1315) BP: (74-130)/(47-73) 94/54 mmHg (09/01 1315) SpO2:  [89 %-100 %] 96 % (09/01 1315) FiO2 (%):  [40 %] 40 % (09/01 0914) Weight:  [80.9 kg (178 lb 5.6 oz)] 80.9 kg (178 lb 5.6 oz) (09/01 0439)  HEMODYNAMICS: CVP:  [9 mmHg-12 mmHg] 12 mmHg  VENTILATOR SETTINGS: Vent Mode:  [-] CPAP FiO2 (%):  [40 %] 40 % Set Rate:  [14 bmp] 14 bmp Vt Set:  [550 mL] 550 mL PEEP:  [5 cmH20] 5 cmH20 Pressure Support:  [10 cmH20] 10 cmH20 Plateau Pressure:  [13 cmH20-26 cmH20] 26 cmH20  INTAKE / OUTPUT:  Intake/Output Summary (Last 24 hours) at 08/20/14 1425 Last data filed at 08/20/14 1300  Gross per 24 hour  Intake 1897.04 ml  Output    840 ml  Net 1057.04 ml   PHYSICAL EXAMINATION: General:  RASS -1 HEENT: WNL PULM: few scattered wheezes CV: tachy, no M noted AB: BS+, soft Ext: cool, no edema Neuro: No focal deficits  LABS: I have reviewed all of today's lab results. Relevant abnormalities are discussed in the A/P section  CXR: mod R effusion, small LLL atx  ASSESSMENT / PLAN:  PULMONARY ETT 8/27 >> 8/30, 8/31 >> 9/01 A:  Acute Respiratory Failure Recurrent Acute Pulmonary Edema  Pleural effusions COPD, O2 dependent PTA Mild wheezing P:   Monitor in ICU post extubation SuppO2 to maintain SpO2 > 93% Cont nebulized steroids and BDs PRN NPPV  CARDIOVASCULAR 8/27 Left IJ CVL 8/27 >>  A:  Acute MI  Inoperable coronary disease  Cardiogenic shock Severe ischemic cardiomyopathy P:  Mgmt per Cards Wean NE to off for MAP > 60 mmHg Consider low dose beta blocker when off pressors  RENAL A:  Hyponatremia, resolved Hypokalemia AKI, nonoliguric - Cr improving  P:   Monitor BMET intermittently Monitor I/Os Correct electrolytes as indicated  GASTROINTESTINAL A:   Chronic intermittent abd pain, concern for mesenteric ischemia Hematochezia,  resolved P:   SUP: enteral pantoprazole NPo post ext. Advance diet as tolerated Consider CTA abdomen/pelvis when clinically stable per GI recs  HEMATOLOGIC A:   Acute blood loss anemia, no active bleeding currently P:  DVT px: SCDs (due to recent bleeding) Monitor CBC intermittently Transfuse per usual ICU guidelines  INFECTIOUS A:   No overt infectious process identified P:   Blood 8/28 >> NEG Sputum 8/28 >> NOF  Vanc 8/28 >> 8/31 Zosyn 8/28 >> 8/31  ENDOCRINE A:   Stress assoc hyperglycemia without prior dx of DM P:   Mod scale SSI  NEUROLOGIC A:   Acute ICU assoc discomfort P:   Low dose PRN fentanyl  TODAY'S SUMMARY:  See note above re: discussion with daughter. Would favor clarifying code status with pt when his sensorium clears fully  CC time 45 minutes  Merton Border, MD ; [facility] [phone].  After 5:30 PM or weekends, call [phone]   08/20/2014 2:25 PM

## 2014-08-20 NOTE — Progress Notes (Signed)
[facility] Progress Note Patient Name: Douglas Mann DOB: 05/23/1946 MRN: [id number]   Date of Service  08/20/2014  HPI/Events of Note  Hypokalemia  eICU Interventions  Potassium replaced     Intervention Category Intermediate Interventions: Electrolyte abnormality - evaluation and management  Kathrina Crosley 08/20/2014, 6:23 AM

## 2014-08-20 NOTE — Procedures (Signed)
Extubation Procedure Note  Patient Details:   Name: Douglas Mann DOB: September 22, 1946 MRN: [id number]   Airway Documentation:     Evaluation  O2 sats: stable throughout Complications: No apparent complications Patient did tolerate procedure well. Bilateral Breath Sounds: Rhonchi;Expiratory wheezes Suctioning: Airway Yes Cuff leak positive Hope Pigeon, MA 08/20/2014, 10:27 AM

## 2014-08-20 NOTE — Progress Notes (Addendum)
       Patient Name: Douglas Mann Date of Encounter: 08/20/2014    SUBJECTIVE: Intubated when seen on rounds. He has subsequently been extubated and seems to be tolerating extubation well.  TELEMETRY:  Normal sinus rhythm/sinus tachycardia Filed Vitals:   08/20/14 1100 08/20/14 1115 08/20/14 1130 08/20/14 1145  BP: 96/61 88/59 114/61 103/59  Pulse: 116 117 112 108  Temp: 99.9 F (37.7 C) 99.9 F (37.7 C) 99.7 F (37.6 C) 99.7 F (37.6 C)  TempSrc:      Resp: 21 22 21 23   Height:      Weight:      SpO2: 96% 95% 94% 97%    Intake/Output Summary (Last 24 hours) at 08/20/14 1255 Last data filed at 08/20/14 1100  Gross per 24 hour  Intake 1859.72 ml  Output    840 ml  Net 1019.72 ml   LABS: Basic Metabolic Panel:  Recent Labs  08/18/14 0329 08/19/14 0330 08/20/14 0500  NA 132* 136* 137  K 3.3* 3.6* 3.3*  CL 96 99 102  CO2 22 23 24   GLUCOSE 159* 161* 158*  BUN 32* 31* 26*  CREATININE 2.47* 2.22* 1.86*  CALCIUM 8.2* 8.5 8.6  MG 2.2 2.6*  --   PHOS 2.6 4.9*  --    CBC:  Recent Labs  08/18/14 0329 08/19/14 0330 08/20/14 0500  WBC 13.3* 18.1* 13.0*  NEUTROABS 11.1* 16.4*  --   HGB 8.7* 9.5* 8.5*  HCT 24.5* 28.2* 25.1*  MCV 86.6 87.0 86.6  PLT 134* 181 158   Cardiac Enzymes:  Recent Labs  08/20/14 0500  TROPONINI 11.82*   BNP    Component Value Date/Time   PROBNP 51736.0* 08/20/2014 0500     Radiology/Studies:  Portable Chest x-ray: 08/20/14 IMPRESSION:  1. Tubes and central line remain in good position.  2. Pulmonary venous congestion with increasing or layering pleural  effusions.  Physical Exam: Blood pressure 103/59, pulse 108, temperature 99.7 F (37.6 C), temperature source Core (Comment), resp. rate 23, height 5\' 7"  (1.702 m), weight 178 lb 5.6 oz (80.9 kg), SpO2 97.00%. Weight change: 4 lb 3 oz (1.9 kg)  Wt Readings from Last 3 Encounters:  08/20/14 178 lb 5.6 oz (80.9 kg)  08/20/14 178 lb 5.6 oz (80.9 kg)  08/20/14 178 lb 5.6 oz  (80.9 kg)   Still intubated at the time of exam Lungs are clear anteriorly Heart sounds are distant. A soft gallop is heard Abdomen is nondistended There is no peripheral edema  ASSESSMENT:  1. Acute on chronic systolic heart failure, with refractory pulmonary congestion 2. Acute respiratory failure secondary to combination of COPD and heart failure. Just extubated and so far tolerating extubation well 3. Coronary artery disease without recurrent angina. Status post LAD stent with intra-aortic balloon pump support 4. CKD with renal function improved based on todays creat likely from increased CO   Plan:  1. Attempt diuresis with IV furosemide 2. Overall prognosis is poor. CODE STATUS is still full 3. Wean Levophed as tolerated by blood pressure 4. Discussed with critical care this morning. 5. Cannot resume ACE or beta blocker yet  Signed, Sinclair Grooms 08/20/2014, 12:55 PM

## 2014-08-21 ENCOUNTER — Inpatient Hospital Stay (HOSPITAL_COMMUNITY): Payer: Medicare Other

## 2014-08-21 LAB — BASIC METABOLIC PANEL
Anion gap: 12 (ref 5–15)
BUN: 22 mg/dL (ref 6–23)
CHLORIDE: 102 meq/L (ref 96–112)
CO2: 25 meq/L (ref 19–32)
CREATININE: 1.78 mg/dL — AB (ref 0.50–1.35)
Calcium: 8.5 mg/dL (ref 8.4–10.5)
GFR calc Af Amer: 43 mL/min — ABNORMAL LOW (ref >90)
GFR calc non Af Amer: 37 mL/min — ABNORMAL LOW (ref >90)
Glucose, Bld: 116 mg/dL — ABNORMAL HIGH (ref 70–99)
POTASSIUM: 3.5 meq/L — AB (ref 3.7–5.3)
Sodium: 139 mEq/L (ref 137–147)

## 2014-08-21 LAB — CBC
HEMATOCRIT: 24.5 % — AB (ref 39.0–52.0)
HEMOGLOBIN: 8.3 g/dL — AB (ref 13.0–17.0)
MCH: 30 pg (ref 26.0–34.0)
MCHC: 33.9 g/dL (ref 30.0–36.0)
MCV: 88.4 fL (ref 78.0–100.0)
Platelets: 142 10*3/uL — ABNORMAL LOW (ref 150–400)
RBC: 2.77 MIL/uL — ABNORMAL LOW (ref 4.22–5.81)
RDW: 14.7 % (ref 11.5–15.5)
WBC: 9.3 10*3/uL (ref 4.0–10.5)

## 2014-08-21 LAB — GLUCOSE, CAPILLARY
GLUCOSE-CAPILLARY: 117 mg/dL — AB (ref 70–99)
GLUCOSE-CAPILLARY: 104 mg/dL — AB (ref 70–99)
GLUCOSE-CAPILLARY: 112 mg/dL — AB (ref 70–99)
Glucose-Capillary: 123 mg/dL — ABNORMAL HIGH (ref 70–99)
Glucose-Capillary: 134 mg/dL — ABNORMAL HIGH (ref 70–99)

## 2014-08-21 LAB — CLOSTRIDIUM DIFFICILE BY PCR: Toxigenic C. Difficile by PCR: NEGATIVE

## 2014-08-21 MED ORDER — FUROSEMIDE 10 MG/ML IJ SOLN
40.0000 mg | Freq: Once | INTRAMUSCULAR | Status: AC
  Administered 2014-08-21: 40 mg via INTRAVENOUS
  Filled 2014-08-21: qty 4

## 2014-08-21 MED ORDER — ALBUTEROL SULFATE (2.5 MG/3ML) 0.083% IN NEBU
2.5000 mg | INHALATION_SOLUTION | Freq: Once | RESPIRATORY_TRACT | Status: AC
  Administered 2014-08-23: 2.5 mg via RESPIRATORY_TRACT

## 2014-08-21 MED ORDER — AMIODARONE HCL 200 MG PO TABS
400.0000 mg | ORAL_TABLET | Freq: Every day | ORAL | Status: DC
  Administered 2014-08-21 – 2014-08-22 (×2): 400 mg via ORAL
  Filled 2014-08-21 (×3): qty 2

## 2014-08-21 MED ORDER — BOOST / RESOURCE BREEZE PO LIQD
1.0000 | Freq: Three times a day (TID) | ORAL | Status: DC
  Administered 2014-08-21: 16:00:00 via ORAL
  Administered 2014-08-22 – 2014-08-25 (×8): 1 via ORAL
  Administered 2014-08-25: 22:00:00 via ORAL
  Administered 2014-08-25 – 2014-08-27 (×6): 1 via ORAL

## 2014-08-21 MED ORDER — SODIUM CHLORIDE 0.9 % IV SOLN: INTRAVENOUS | Status: DC

## 2014-08-21 MED ORDER — POTASSIUM CHLORIDE CRYS ER 20 MEQ PO TBCR
40.0000 meq | EXTENDED_RELEASE_TABLET | ORAL | Status: AC
  Administered 2014-08-21 (×2): 40 meq via ORAL
  Filled 2014-08-21 (×2): qty 2

## 2014-08-21 MED ORDER — MILRINONE IN DEXTROSE 20 MG/100ML IV SOLN
0.1250 ug/kg/min | INTRAVENOUS | Status: DC
  Administered 2014-08-21 – 2014-08-24 (×3): 0.125 ug/kg/min via INTRAVENOUS
  Filled 2014-08-21 (×3): qty 100

## 2014-08-21 MED ORDER — MORPHINE SULFATE 2 MG/ML IJ SOLN
2.0000 mg | Freq: Once | INTRAMUSCULAR | Status: AC
  Administered 2014-08-21: 2 mg via INTRAVENOUS
  Filled 2014-08-21: qty 1

## 2014-08-21 NOTE — Progress Notes (Addendum)
       Patient Name: Douglas Mann Date of Encounter: 08/21/2014    SUBJECTIVE: The patient is improved this morning. He is extubated and not significantly short of breath. He is able to communicate without difficulty. He is complaining about postnasal drip.  TELEMETRY:  Sinus rhythm with occasional ventricular ectopic beat. Heart rate is slowed Filed Vitals:   08/21/14 1000 08/21/14 1100 08/21/14 1200 08/21/14 1243  BP: 117/59 100/58 110/62 119/69  Pulse: 119 104 105 111  Temp: 99.3 F (37.4 C) 99.5 F (37.5 C) 99.5 F (37.5 C) 98.6 F (37 C)  TempSrc:   Core (Comment) Oral  Resp: 23 19 19 20   Height:      Weight:      SpO2: 95% 97% 97% 97%    Intake/Output Summary (Last 24 hours) at 08/21/14 1300 Last data filed at 08/21/14 1200  Gross per 24 hour  Intake 1406.3 ml  Output   2500 ml  Net -1093.7 ml   LABS: Basic Metabolic Panel:  Recent Labs  08/19/14 0330 08/20/14 0500 08/21/14 0344  NA 136* 137 139  K 3.6* 3.3* 3.5*  CL 99 102 102  CO2 23 24 25   GLUCOSE 161* 158* 116*  BUN 31* 26* 22  CREATININE 2.22* 1.86* 1.78*  CALCIUM 8.5 8.6 8.5  MG 2.6*  --   --   PHOS 4.9*  --   --    CBC:  Recent Labs  08/19/14 0330 08/20/14 0500 08/21/14 0344  WBC 18.1* 13.0* 9.3  NEUTROABS 16.4*  --   --   HGB 9.5* 8.5* 8.3*  HCT 28.2* 25.1* 24.5*  MCV 87.0 86.6 88.4  PLT 181 158 142*   Cardiac Enzymes:  Recent Labs  08/20/14 0500  TROPONINI 11.82*     Radiology/Studies:  Chest x-ray showing continued improvement in pulmonary edema and effusions 08/21/14  Physical Exam: Blood pressure 119/69, pulse 111, temperature 98.6 F (37 C), temperature source Oral, resp. rate 20, height 5\' 7"  (1.702 m), weight 176 lb 5.9 oz (80 kg), SpO2 97.00%. Weight change:   Wt Readings from Last 3 Encounters:  08/21/14 176 lb 5.9 oz (80 kg)  08/21/14 176 lb 5.9 oz (80 kg)  08/21/14 176 lb 5.9 oz (80 kg)   Moderate neck vein elevation Loud S3 gallop is heard on auscultation.  2 of 6 systolic murmur is heard at the apex. Diminished breath sounds bilaterally with faint rales. Neuro exam is unremarkable. Abdomen is nontender and non-distended  ASSESSMENT:  1. Acute on chronic systolic heart failure, slowly improving but with the assistance of inotropic support. Since starting milrinone there has been a gradual decrease in creatinine and improved diuresis. 2. Severe oxygen requiring COPD 3. Status post salvage LAD stent in the setting of cardiogenic shock and pulmonary edema, with a nice angiographic result and now gradual improvement. 4. Ventricular tachycardia, suppressed with IV amiodarone  Plan:  1. Continue milrinone to optimize management of heart failure and get a full diuresis (one dose of IV Lasix, milligrams today). Attempt to achieve an overall negative I and O. before discontinuation of milrinone 2. Change amiodarone to oral dosing 3. Replete potassium 4. May need heart failure team to assist with management was transferred to the floor 5. Critically ill and poor overall prognosis per  Douglas Mann 08/21/2014, 1:00 PM

## 2014-08-21 NOTE — Progress Notes (Signed)
Pt received into room 2w15, pt oriented to room and call bell, vitals taken, tele placed on pt, 4L [place], pt in bed, will continue to monitor closely Rickard Rhymes, RN

## 2014-08-21 NOTE — Progress Notes (Signed)
Encouraged PT to deep breathe and cough.

## 2014-08-21 NOTE — Significant Event (Signed)
Rapid Response Event Note  Overview: Time Called: 2313 Arrival Time: 2330 Event Type: Respiratory  Initial Focused Assessment: After being called by the bedside RN for complaints of pt in respiratory distress with HR in the 130's and RR 30's with an o2 saturation in the 80's on 5 liters of o2 I advised to place on non-rebreather until my arrival. On arrival pt was found to have bilateral wheezing and crackles in his bases, oxygen saturation on non-rebreather was 95%. Order placed for an albuterol treatment per protocol, respiratory made aware. Pt has a history of pulmonary edema and is currently a DNR. CCM contacted through the black box and Dr. Detterding advised of the situation, increased work of breathing discomfort and wheezing with crackles. Orders given for 2 of morphine for comfort and  Physician declined other orders. Will continue to monitor. 0001 Pt looking more comfortable after Albuterol treatment, wheezing still present, tachycardia and tachypnea still present, o2 on non-rebreather 98%, pt just received 2 mg of morphine.  Pt checked after breathing treatment and morphine and stated that he didn't feel any better, pt still audibly wheezing and tachipnic in the 30's and tachycardic in the 130's. O2 on non-rebreather still 97%.   Interventions: Albuterol 2.5mg  treatment ordered per protocol, CCM Dr. Palisade contacted and made aware of situation and pt status. Orders received for 2 mg of morphine and no further orders at this time.   Event Summary: 2345 Dr Detterding contacted about patient and orders received for 2 mg of Morphine, physician refused any further orders at this time. 0001 2 mg of Morphine given per MD orders   at      at          [facility], [address]

## 2014-08-21 NOTE — Progress Notes (Signed)
NUTRITION FOLLOW-UP  INTERVENTION: Resource Breeze po TID, each supplement provides 250 kcal and 9 grams of protein  NUTRITION DIAGNOSIS: Inadequate oral intake now related to decreased appetite and altered gi function as evidenced by meal completion <50%.  Goal: Patient to meet >90% estimated needs with meals and supplements; not met.   Monitor:  PO intake, labs, weight trends   ASSESSMENT: Patient presented with chest pain and lower GI bleed. Pt has severe COPD.  Reports that his appetite is poor, hasn't been able to taste anything for the past 2-3 years since his last MI.   Pt extubated 9/1 and started on diet 9/2.  Potassium low.  Per pt he has not had a meal yet today and is not very hungry. Pt complains of gas/bloating. Pt dislikes ensure as this gives him gas. Pt is willing to try [employer]. Helped pt order his supper.    Height: Ht Readings from Last 1 Encounters:  08/15/14 '5\' 7"'  (1.702 m)    Weight: Wt Readings from Last 1 Encounters:  08/21/14 176 lb 5.9 oz (80 kg)    BMI:  Body mass index is 27.62 kg/(m^2).  Estimated Nutritional Needs: Kcal: 1900-2100 Protein: 85-95 grams Fluid: >1.9 L/day  Skin: ecchymosis  Diet Order: Cardiac   Intake/Output Summary (Last 24 hours) at 08/21/14 1426 Last data filed at 08/21/14 1200  Gross per 24 hour  Intake 1386.6 ml  Output   2145 ml  Net -758.4 ml    Last BM: 9/1  Labs:   Recent Labs Lab 08/17/14 0400 08/18/14 0329 08/19/14 0330 08/20/14 0500 08/21/14 0344  NA 134* 132* 136* 137 139  K 3.3* 3.3* 3.6* 3.3* 3.5*  CL 99 96 99 102 102  CO2 '22 22 23 24 25  ' BUN 20 32* 31* 26* 22  CREATININE 2.12* 2.47* 2.22* 1.86* 1.78*  CALCIUM 8.0* 8.2* 8.5 8.6 8.5  MG 2.2 2.2 2.6*  --   --   PHOS 1.5* 2.6 4.9*  --   --   GLUCOSE 128* 159* 161* 158* 116*    CBG (last 3)   Recent Labs  08/21/14 0322 08/21/14 0725 08/21/14 1158  GLUCAP 117* 104* 123*    Scheduled Meds: . amiodarone  400 mg Oral  Daily  . antiseptic oral rinse  7 mL Mouth Rinse BID  . aspirin  81 mg Oral Daily  . atorvastatin  80 mg Oral q1800  . budesonide (PULMICORT) nebulizer solution  0.5 mg Nebulization BID  . clopidogrel  75 mg Oral Daily  . hydrocortisone   Rectal BID  . insulin aspart  0-15 Units Subcutaneous 6 times per day  . ipratropium-albuterol  3 mL Nebulization Q6H  . sodium chloride  3 mL Intravenous Q12H  . sodium chloride  3 mL Intravenous Q12H  . triamcinolone cream   Topical BID    Continuous Infusions: . sodium chloride 7 mL/hr at 08/21/14 1200  . milrinone 0.125 mcg/kg/min (08/21/14 0800)    [place], [place], CNSC [phone] Pager [phone] After Hours Pager

## 2014-08-21 NOTE — Progress Notes (Signed)
PULMONARY / CRITICAL CARE MEDICINE   Name: Douglas Mann MRN: [id number] DOB: [birth year]    ADMISSION DATE:  08/13/2014 CONSULTATION DATE:  08/15/2014   REFERRING MD :   [facility]ad  INITIAL PRESENTATION:   68 y/o male admitted on 8/25 for BRBPR and abdominal pain who developed chest pain, NSTEMI, VT on 8/27.  He was intubated and went to the cath lab where he was found to have a markedly elevated LVEDP.  EVENTS  8/25 Admitted with complaints of abd pain, BRBPR 8/25 CT abs/pelvis: no acute findings 8/27 Developed CP with c/o dysphagia 8/27 Echocardiogram: The cavity size was normal. Wall thickness was increased in a pattern of mild LVH. Systolic function was normal. The estimated ejection fraction was in the range of 55% to 60%. Wall motion was normal; there were no regional wall motion abnormalities. The LA was mildly dilated 8/27 Cards consult 8/27 developed resp distress, diaphoresis, CP, pulm edema. Positive cardiac markers 8/27 PCCM consult and intubation 8/28 early AM: urgent cath: Severe 2 vessel CAD with 100% occluded RCA, LAD 90% stenosed, severe ischemic cardiomyopathy LVEF 5-10% 8/28 AM IABP placed 8/28 Echocardiogram: LVEF 30-35% 8/28 GI consult: Concern for possible chronic mesenteric ischemia. Consider CTA abdomen/pelvis when clinically stable 8/30 IABP removed and extubated 8/31 early AM: re-intubated. Edema pattern on CXR. Milrinone initiated by Cardiology 9/01 Extubated. Remained slightly encephalopathic due to residual sedating medications. Discussed re-intubation status with pt (who did not seem to fully grasp the conversation) and his daughter who feels certain that he would not wish to undergo repeat intubation or ACLS in this situation where there are very limited treatment options and where recurrent respiratory fialure would be indicative of failure of those few options  SUBJECTIVE:  Wake talking on phone  VITAL SIGNS: Temp:  [99.5 F (37.5 C)-100.6 F (38.1 C)] 99.7  F (37.6 C) (09/02 0800) Pulse Rate:  [43-122] 99 (09/02 0800) Resp:  [13-25] 21 (09/02 0800) BP: (85-124)/(46-80) 100/59 mmHg (09/02 0800) SpO2:  [90 %-100 %] 98 % (09/02 0855) Weight:  [80 kg (176 lb 5.9 oz)] 80 kg (176 lb 5.9 oz) (09/02 0800)  HEMODYNAMICS:    VENTILATOR SETTINGS:    INTAKE / OUTPUT:  Intake/Output Summary (Last 24 hours) at 08/21/14 0931 Last data filed at 08/21/14 0800  Gross per 24 hour  Intake 1142.39 ml  Output   1930 ml  Net -787.61 ml   PHYSICAL EXAMINATION: General:  RASS 0, follows commands HEENT: WNL PULM: coarse mild CV: tachy 114, no M AB: BS+, soft Ext: cool, no edema Neuro: No focal deficits  LABS: I have reviewed all of today's lab results. Relevant abnormalities are discussed in the A/P section  CXR: imrpoved  ASSESSMENT / PLAN:  PULMONARY ETT 8/27 >> 8/30, 8/31 >> 9/01 A:  Acute Respiratory Failure Recurrent Acute Pulmonary Edema  Pleural effusions COPD, O2 dependent PTA Mild wheezing P:   Monitor in ICU post extubation SuppO2 to maintain SpO2 > 93% Cont nebulized steroids and BDs pcxr improved, neg balance as able  CARDIOVASCULAR 8/27 Left IJ CVL 8/27 >>  A:  Acute MI  Inoperable coronary disease Cardiogenic shock Severe ischemic cardiomyopathy P:  Mgmt per Cards Consider low dose beta blocker when off pressors, start today if ok by cards restarting pressors unlikely to change outcome Milrinone, can we wean this to off?  RENAL A:  Hyponatremia, resolved Hypokalemia AKI, nonoliguric - Cr improving  P:   Monitor BMET intermittently Maintain neg balance, lasix given, did well with  last dosing  GASTROINTESTINAL A:   Chronic intermittent abd pain, concern for mesenteric ischemia Hematochezia, resolved P:   SUP: enteral pantoprazole diet Consider CTA abdomen/pelvis when clinically stable per GI recs  HEMATOLOGIC A:   Acute blood loss anemia, no active bleeding currently P:  DVT px: SCDs (due to  recent bleeding) Monitor CBC intermittently Transfuse per usual ICU guidelines  INFECTIOUS A:   No overt infectious process identified P:   Blood 8/28 >> NEG Sputum 8/28 >> NOF  Vanc 8/28 >> 8/31 Zosyn 8/28 >> 8/31  ENDOCRINE A:   Stress assoc hyperglycemia without prior dx of DM P:   Mod scale SSI  NEUROLOGIC A:   Acute ICU assoc discomfort P:   Low dose PRN fentanyl  TODAY'S SUMMARY:  Transfer tele, lasix, diet, can we reduce milrinone?, BB per cards when safe  Lavon Paganini. Titus Mould, MD, [place] Pgr: [facility]

## 2014-08-22 ENCOUNTER — Inpatient Hospital Stay (HOSPITAL_COMMUNITY): Payer: Medicare Other

## 2014-08-22 DIAGNOSIS — R142 Eructation: Secondary | ICD-10-CM

## 2014-08-22 DIAGNOSIS — Z515 Encounter for palliative care: Secondary | ICD-10-CM

## 2014-08-22 DIAGNOSIS — R0989 Other specified symptoms and signs involving the circulatory and respiratory systems: Secondary | ICD-10-CM

## 2014-08-22 DIAGNOSIS — R0609 Other forms of dyspnea: Secondary | ICD-10-CM

## 2014-08-22 DIAGNOSIS — R143 Flatulence: Secondary | ICD-10-CM

## 2014-08-22 DIAGNOSIS — R05 Cough: Secondary | ICD-10-CM

## 2014-08-22 DIAGNOSIS — R059 Cough, unspecified: Secondary | ICD-10-CM

## 2014-08-22 DIAGNOSIS — R141 Gas pain: Secondary | ICD-10-CM

## 2014-08-22 LAB — CULTURE, BLOOD (ROUTINE X 2)
Culture: NO GROWTH
Culture: NO GROWTH

## 2014-08-22 LAB — CBC
HEMATOCRIT: 24.9 % — AB (ref 39.0–52.0)
HEMOGLOBIN: 8.2 g/dL — AB (ref 13.0–17.0)
MCH: 29 pg (ref 26.0–34.0)
MCHC: 32.9 g/dL (ref 30.0–36.0)
MCV: 88 fL (ref 78.0–100.0)
PLATELETS: 189 10*3/uL (ref 150–400)
RBC: 2.83 MIL/uL — ABNORMAL LOW (ref 4.22–5.81)
RDW: 14.9 % (ref 11.5–15.5)
WBC: 11.5 10*3/uL — AB (ref 4.0–10.5)

## 2014-08-22 LAB — COMPREHENSIVE METABOLIC PANEL
ALT: 27 U/L (ref 0–53)
AST: 37 U/L (ref 0–37)
Albumin: 2.4 g/dL — ABNORMAL LOW (ref 3.5–5.2)
Alkaline Phosphatase: 112 U/L (ref 39–117)
Anion gap: 13 (ref 5–15)
BILIRUBIN TOTAL: 0.4 mg/dL (ref 0.3–1.2)
BUN: 24 mg/dL — AB (ref 6–23)
CALCIUM: 8.4 mg/dL (ref 8.4–10.5)
CHLORIDE: 102 meq/L (ref 96–112)
CO2: 25 mEq/L (ref 19–32)
CREATININE: 1.79 mg/dL — AB (ref 0.50–1.35)
GFR, EST AFRICAN AMERICAN: 43 mL/min — AB (ref >90)
GFR, EST NON AFRICAN AMERICAN: 37 mL/min — AB (ref >90)
GLUCOSE: 102 mg/dL — AB (ref 70–99)
Potassium: 4.2 mEq/L (ref 3.7–5.3)
Sodium: 140 mEq/L (ref 137–147)
Total Protein: 6.2 g/dL (ref 6.0–8.3)

## 2014-08-22 LAB — GLUCOSE, CAPILLARY
GLUCOSE-CAPILLARY: 119 mg/dL — AB (ref 70–99)
GLUCOSE-CAPILLARY: 161 mg/dL — AB (ref 70–99)
Glucose-Capillary: 171 mg/dL — ABNORMAL HIGH (ref 70–99)
Glucose-Capillary: 127 mg/dL — ABNORMAL HIGH (ref 70–99)
Glucose-Capillary: 95 mg/dL (ref 70–99)
Glucose-Capillary: 171 mg/dL — ABNORMAL HIGH (ref 70–99)

## 2014-08-22 LAB — PRO B NATRIURETIC PEPTIDE: Pro B Natriuretic peptide (BNP): 43395 pg/mL — ABNORMAL HIGH (ref 0–125)

## 2014-08-22 MED ORDER — SIMETHICONE 40 MG/0.6ML PO SUSP
40.0000 mg | Freq: Four times a day (QID) | ORAL | Status: DC | PRN
  Administered 2014-08-22 – 2014-09-03 (×27): 40 mg via ORAL
  Filled 2014-08-22 (×27): qty 0.6

## 2014-08-22 MED ORDER — IPRATROPIUM-ALBUTEROL 0.5-2.5 (3) MG/3ML IN SOLN
3.0000 mL | RESPIRATORY_TRACT | Status: DC
  Administered 2014-08-22 – 2014-08-26 (×23): 3 mL via RESPIRATORY_TRACT
  Filled 2014-08-22 (×22): qty 3

## 2014-08-22 MED ORDER — GUAIFENESIN ER 600 MG PO TB12
600.0000 mg | ORAL_TABLET | Freq: Two times a day (BID) | ORAL | Status: DC
  Administered 2014-08-22 – 2014-08-27 (×10): 600 mg via ORAL
  Filled 2014-08-22 (×12): qty 1

## 2014-08-22 MED ORDER — METHYLPREDNISOLONE SODIUM SUCC 125 MG IJ SOLR
60.0000 mg | Freq: Three times a day (TID) | INTRAMUSCULAR | Status: DC
  Administered 2014-08-22 – 2014-08-25 (×9): 60 mg via INTRAVENOUS
  Filled 2014-08-22 (×12): qty 0.96

## 2014-08-22 MED ORDER — SODIUM CHLORIDE 0.9 % IJ SOLN
10.0000 mL | INTRAMUSCULAR | Status: DC | PRN
  Administered 2014-08-22 – 2014-08-24 (×2): 20 mL
  Administered 2014-08-25: 10 mL

## 2014-08-22 MED ORDER — FUROSEMIDE 10 MG/ML IJ SOLN: 40.0000 mg | Freq: Two times a day (BID) | INTRAMUSCULAR | Status: DC

## 2014-08-22 MED ORDER — FUROSEMIDE 10 MG/ML IJ SOLN
40.0000 mg | Freq: Two times a day (BID) | INTRAMUSCULAR | Status: DC
  Administered 2014-08-22 – 2014-08-23 (×2): 40 mg via INTRAVENOUS
  Filled 2014-08-22 (×4): qty 4

## 2014-08-22 NOTE — Progress Notes (Addendum)
       Patient Name: Kayson Tasker Date of Encounter: 08/22/2014    SUBJECTIVE: The patient complains of dyspnea; no chest pain  TELEMETRY:  Sinus rhythm with NSVT  Filed Vitals:   08/21/14 2026 08/22/14 0027 08/22/14 0355 08/22/14 0917  BP:  118/73 100/63   Pulse: 103 139 118   Temp:  97.8 F (36.6 C) 97.6 F (36.4 C)   TempSrc:  Axillary Oral   Resp: 20  22   Height:      Weight:      SpO2: 98% 98% 100% 99%    Intake/Output Summary (Last 24 hours) at 08/22/14 0954 Last data filed at 08/21/14 1858  Gross per 24 hour  Intake    489 ml  Output   1100 ml  Net   -611 ml   LABS: Basic Metabolic Panel:  Recent Labs  08/20/14 0500 08/21/14 0344  NA 137 139  K 3.3* 3.5*  CL 102 102  CO2 24 25  GLUCOSE 158* 116*  BUN 26* 22  CREATININE 1.86* 1.78*  CALCIUM 8.6 8.5   CBC:  Recent Labs  08/20/14 0500 08/21/14 0344  WBC 13.0* 9.3  HGB 8.5* 8.3*  HCT 25.1* 24.5*  MCV 86.6 88.4  PLT 158 142*   Cardiac Enzymes:  Recent Labs  08/20/14 0500  TROPONINI 11.82*     Radiology/Studies:  Chest x-ray showing continued improvement in pulmonary edema and effusions 08/21/14  Physical Exam: Blood pressure 100/63, pulse 118, temperature 97.6 F (36.4 C), temperature source Oral, resp. rate 22, height 5\' 7"  (1.702 m), weight 176 lb 5.9 oz (80 kg), SpO2 99.00%. Weight change:   Wt Readings from Last 3 Encounters:  08/21/14 176 lb 5.9 oz (80 kg)  08/21/14 176 lb 5.9 oz (80 kg)  08/21/14 176 lb 5.9 oz (80 kg)   HEENT normal Neck supple CV tachycardic Diminished breath sounds bilaterally with faint rales. Abd: soft Neuro exam is unremarkable. Ext no edema  ASSESSMENT:  1. Acute on chronic systolic heart failure/ICM-Patient complains of increased dyspnea this morning. This is most likely multifactorial including severe COPD with superimposed congestive heart failure. Steroids have been initiated. I will diurese gently and follow renal function. Continue milrinone.  Add low-dose hydralazine later if BP allows. I am hesitant to add an ACE inhibitor given recent renal insufficiency. Note chest x-ray and BNP pending. 2. Severe oxygen requiring COPD-Continue pulmonary toilet per pulmonary. 3. CAD-Status post salvage LAD stent in the setting of cardiogenic shock and pulmonary edema, with a nice angiographic result and now gradual improvement. Continue aspirin, Plavix and statin. 4. Ventricular tachycardia, suppressed with IV amiodarone 5 chronic kidney disease-watch renal function closely with diuresis Patient's prognosis is poor. Agree with palliative care consult. Patient is a no CODE BLUE. Alex Gardener 08/22/2014, 9:54 AM

## 2014-08-22 NOTE — Progress Notes (Signed)
Pt went into respiratory distress and with O2 sats at 78% on 4L and a HR in the 130s. EKG completed showing Sinus Tach.  Rapid response and RT called. Pt placed on non-rebreather and given a breathing treatment. MD notified. MD ordered 2mg  of Morphine IV. Orders initiated.

## 2014-08-22 NOTE — Progress Notes (Signed)
PROGRESS NOTE  Douglas Mann JJO:[id number] DOB: 1946-08-10 DOA: 08/13/2014 PCP: [name], MD  68 year old male with 17 pack active smoker, full code with known CAD and s/p stent at [facility] > 10 years ago and copd with o2 dependency Admitted 08/13/14  with  a chief complaint of bleeding in his bowels and a swollen abdomen along with a story that sounded like mesentric angina (post prandial abd discmfrt and weight Loss and ability to handle only small amounts of food at at time). At admission he ruled out for MI. Then on evening of 08/15/14 he develped sudden onset chest pain, with sinus tachycardia with EKG change to duffise ST depression in inferior and lateral leads and respiratory distress with CXR suggestive of flash pulmonary edema. Despite lasix he developed 2 x episodes of V Tach with normal BP and needing IV amio (never needed cpr). Patient emergently intubated on [place] floor and moved to ICU at [facility].   8/30 he was extubated  Then on 8/31 early AM: re-intubated. Edema pattern on CXR. Milrinone initiated by Cardiology  9/01 Extubated. Remained slightly encephalopathic due to residual sedating medications. Discussed re-intubation status with pt (who did not seem to fully grasp the conversation) and his daughter who feels certain that he would not wish to undergo repeat intubation or ACLS in this situation where there are very limited treatment options and where recurrent respiratory fialure would be indicative of failure of those few options  Assessment/Plan: Acute on chronic systolic heart failure-  -cards consult -milrinone -lasix  Severe oxygen requiring COPD - wears O2 at home and still smokes -add steroids -nebs -chest x ray  Status post salvage LAD stent in the setting of cardiogenic shock and pulmonary edema   Ventricular tachycardia, suppressed with IV amiodarone  AKI -lasix Cr improving  Chronic intermittent abd pain, concern for mesenteric ischemia  Hematochezia,  resolved Consider CTA abdomen/pelvis when clinically stable per GI recs  ABLA, now anemia of CD -transfuse for < 8  Hyperglycemia SSI   Code Status: DNR Family Communication: called daughter, not available Disposition Plan:    Consultants:  PCCM  Cards  pallaitive care  Procedures:  cath  Antibiotics:    HPI/Subjective: Developed SOB and difficulty breathing last PM  Objective: Filed Vitals:   08/22/14 0355  BP: 100/63  Pulse: 118  Temp: 97.6 F (36.4 C)  Resp: 22    Intake/Output Summary (Last 24 hours) at 08/22/14 1005 Last data filed at 08/21/14 1858  Gross per 24 hour  Intake    246 ml  Output   1100 ml  Net   -854 ml   Filed Weights   08/19/14 0358 08/20/14 0439 08/21/14 0800  Weight: 79 kg (174 lb 2.6 oz) 80.9 kg (178 lb 5.6 oz) 80 kg (176 lb 5.9 oz)    Exam:   General:  Non-rebreather, no increase work of breathing  Cardiovascular: rrr  Respiratory: wheezing b/l  Abdomen: +BS, soft  Musculoskeletal: moves all 4 ext   Data Reviewed: Basic Metabolic Panel:  Recent Labs Lab 08/15/14 2144 08/16/14 0400 08/17/14 0400 08/18/14 0329 08/19/14 0330 08/20/14 0500 08/21/14 0344  NA  --  135* 134* 132* 136* 137 139  K  --  4.4 3.3* 3.3* 3.6* 3.3* 3.5*  CL  --  99 99 96 99 102 102  CO2  --  19 22 22 23 24 25   GLUCOSE  --  188* 128* 159* 161* 158* 116*  BUN  --  13 20  32* 31* 26* 22  CREATININE  --  1.24 2.12* 2.47* 2.22* 1.86* 1.78*  CALCIUM  --  8.1* 8.0* 8.2* 8.5 8.6 8.5  MG 1.9 4.9* 2.2 2.2 2.6*  --   --   PHOS 4.8* 3.9 1.5* 2.6 4.9*  --   --    Liver Function Tests:  Recent Labs Lab 08/15/14 1957  AST 26  ALT 11  ALKPHOS 83  BILITOT 0.5  PROT 7.5  ALBUMIN 3.5   No results found for this basename: LIPASE, AMYLASE,  in the last 168 hours No results found for this basename: AMMONIA,  in the last 168 hours CBC:  Recent Labs Lab 08/15/14 1957 08/16/14 0400 08/17/14 0400 08/18/14 0329 08/19/14 0330  08/20/14 0500 08/21/14 0344  WBC 15.1* 16.6* 14.8* 13.3* 18.1* 13.0* 9.3  NEUTROABS 9.3* 14.9* 11.8* 11.1* 16.4*  --   --   HGB 11.1* 10.0* 9.4* 8.7* 9.5* 8.5* 8.3*  HCT 33.7* 30.1* 27.2* 24.5* 28.2* 25.1* 24.5*  MCV 90.3 91.8 85.5 86.6 87.0 86.6 88.4  PLT 223 195 170 134* 181 158 142*   Cardiac Enzymes:  Recent Labs Lab 08/16/14 0900 08/16/14 1456 08/16/14 2200 08/17/14 0200 08/20/14 0500  TROPONINI >20.00* >20.00* >20.00* >20.00* 11.82*   BNP (last 3 results)  Recent Labs  08/15/14 1956 08/20/14 0500  PROBNP 2319.0* 51736.0*   CBG:  Recent Labs Lab 08/21/14 1615 08/21/14 2012 08/22/14 0023 08/22/14 0410 08/22/14 0844  GLUCAP 134* 112* 171* 127* 119*    Recent Results (from the past 240 hour(s))  MRSA PCR SCREENING     Status: None   Collection Time    08/16/14  1:59 AM      Result Value Ref Range Status   MRSA by PCR NEGATIVE  NEGATIVE Final   Comment:            The GeneXpert MRSA Assay (FDA     approved for NASAL specimens     only), is one component of a     comprehensive MRSA colonization     surveillance program. It is not     intended to diagnose MRSA     infection nor to guide or     monitor treatment for     MRSA infections.  CULTURE, RESPIRATORY (NON-EXPECTORATED)     Status: None   Collection Time    08/16/14 11:48 AM      Result Value Ref Range Status   Specimen Description TRACHEAL ASPIRATE   Final   Special Requests NONE   Final   Gram Stain     Final   Value: FEW WBC PRESENT,BOTH PMN AND MONONUCLEAR     RARE SQUAMOUS EPITHELIAL CELLS PRESENT     FEW GRAM POSITIVE COCCI     IN PAIRS IN CLUSTERS RARE GRAM NEGATIVE RODS     Performed at [facility]   Culture     Final   Value: Non-Pathogenic Oropharyngeal-type Flora Isolated.     Performed at [facility]   Report Status 08/18/2014 FINAL   Final  CULTURE, BLOOD (ROUTINE X 2)     Status: None   Collection Time    08/16/14 12:25 PM      Result Value Ref Range  Status   Specimen Description BLOOD RIGHT HAND   Final   Special Requests BOTTLES DRAWN AEROBIC AND ANAEROBIC 5CC   Final   Culture  Setup Time     Final   Value: 08/16/2014 17:16  Performed at [facility]     Final   Value: NO GROWTH 5 DAYS     Performed at [facility]   Report Status 08/22/2014 FINAL   Final  CULTURE, BLOOD (ROUTINE X 2)     Status: None   Collection Time    08/16/14 12:40 PM      Result Value Ref Range Status   Specimen Description BLOOD LEFT HAND   Final   Special Requests BOTTLES DRAWN AEROBIC AND ANAEROBIC 5CC   Final   Culture  Setup Time     Final   Value: 08/16/2014 17:16     Performed at [facility]   Culture     Final   Value: NO GROWTH 5 DAYS     Performed at [facility]   Report Status 08/22/2014 FINAL   Final  CLOSTRIDIUM DIFFICILE BY PCR     Status: None   Collection Time    08/20/14  5:06 PM      Result Value Ref Range Status   C difficile by pcr NEGATIVE  NEGATIVE Final     Studies: Dg Chest Port 1 View  08/21/2014   CLINICAL DATA:  Respiratory failure.  EXAM: PORTABLE CHEST - 1 VIEW  COMPARISON:  Single view of the chest 08/20/2014 and 08/19/2014.  FINDINGS: Endotracheal tube and feeding tube have been removed. Left IJ catheter remains in place. Pulmonary edema continues to improve. Small effusions and basilar airspace disease, right worse than left, have also improved. Heart size is normal. No pneumothorax is identified. Remote right clavicle fracture is noted.  IMPRESSION: Continued improvement in edema, effusions and basilar atelectasis.   Electronically Signed   By: Inge Rise M.D.   On: 08/21/2014 07:14    Scheduled Meds: . albuterol  2.5 mg Nebulization Once  . amiodarone  400 mg Oral Daily  . antiseptic oral rinse  7 mL Mouth Rinse BID  . aspirin  81 mg Oral Daily  . atorvastatin  80 mg Oral q1800  . budesonide (PULMICORT) nebulizer solution  0.5 mg Nebulization BID  . clopidogrel   75 mg Oral Daily  . feeding supplement (RESOURCE BREEZE)  1 Container Oral TID BM  . hydrocortisone   Rectal BID  . insulin aspart  0-15 Units Subcutaneous 6 times per day  . ipratropium-albuterol  3 mL Nebulization Q4H  . methylPREDNISolone (SOLU-MEDROL) injection  60 mg Intravenous 3 times per day  . sodium chloride  3 mL Intravenous Q12H  . sodium chloride  3 mL Intravenous Q12H  . triamcinolone cream   Topical BID   Continuous Infusions: . sodium chloride 7 mL/hr at 08/21/14 1200  . 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   Acute combined systolic and diastolic HF (heart failure), NYHA class 4: In setting of non-STEMI; LVEDP 45 mmHg   Cardiomyopathy, ischemic:  Severe. EF 5 -10% by LV gram - following non-STEMI and ventricular tachycardia   AKI (acute kidney injury)    Time spent: 35 min    Erasmo Vertz  [facility] Pager [phone]. If 7PM-7AM, please contact night-coverage at [email or web address], password [facility] 08/22/2014, 10:05 AM  LOS: 9 days

## 2014-08-22 NOTE — Progress Notes (Signed)
PULMONARY / CRITICAL CARE MEDICINE   Name: Douglas Mann MRN: [id number] DOB: Aug 17, 1946    ADMISSION DATE:  08/13/2014 CONSULTATION DATE:  08/15/2014   REFERRING MD :   [facility]  INITIAL PRESENTATION:   68 y/o male admitted on 8/25 for BRBPR and abdominal pain who developed chest pain, NSTEMI, VT on 8/27.  He was intubated and went to the cath lab where he was found to have a markedly elevated LVEDP.  EVENTS  8/25 Admitted with complaints of abd pain, BRBPR 8/25 CT abs/pelvis: no acute findings 8/27 Developed CP with c/o dysphagia 8/27 Echocardiogram: The cavity size was normal. Wall thickness was increased in a pattern of mild LVH. Systolic function was normal. The estimated ejection fraction was in the range of 55% to 60%. Wall motion was normal; there were no regional wall motion abnormalities. The LA was mildly dilated 8/27 Cards consult 8/27 developed resp distress, diaphoresis, CP, pulm edema. Positive cardiac markers 8/27 PCCM consult and intubation 8/28 early AM: urgent cath: Severe 2 vessel CAD with 100% occluded RCA, LAD 90% stenosed, severe ischemic cardiomyopathy LVEF 5-10% 8/28 AM IABP placed 8/28 Echocardiogram: LVEF 30-35% 8/28 GI consult: Concern for possible chronic mesenteric ischemia. Consider CTA abdomen/pelvis when clinically stable 8/30 IABP removed and extubated 8/31 early AM: re-intubated. Edema pattern on CXR. Milrinone initiated by Cardiology 9/01 Extubated. Remained slightly encephalopathic due to residual sedating medications. Discussed re-intubation status with pt (who did not seem to fully grasp the conversation) and his daughter who feels certain that he would not wish to undergo repeat intubation or ACLS in this situation where there are very limited treatment options and where recurrent respiratory fialure would be indicative of failure of those few options. EXTUBATED 08/20/14  SUBJECTIVE:   08/22/14: Now on floor bed.  Significant respiratory distress overnight  with wheezing and crack;es and Rx with morphine, nebs and o2. This am still c/o dyspnea but much better. Has NRB on  VITAL SIGNS: Temp:  [97.6 F (36.4 C)-99.9 F (37.7 C)] 97.6 F (36.4 C) (09/03 0355) Pulse Rate:  [103-139] 118 (09/03 0355) Resp:  [19-23] 22 (09/03 0355) BP: (100-119)/(58-73) 100/63 mmHg (09/03 0355) SpO2:  [95 %-100 %] 100 % (09/03 0355)  HEMODYNAMICS:    VENTILATOR SETTINGS:    INTAKE / OUTPUT:  Intake/Output Summary (Last 24 hours) at 08/22/14 0916 Last data filed at 08/21/14 1858  Gross per 24 hour  Intake    489 ml  Output   1100 ml  Net   -611 ml   PHYSICAL EXAMINATION: General:  RASS 0, follows commands, Looks well HEENT: WNL PULM: NRB on . No distress but wheezing + CV: tachy 114, no M AB: BS+, soft Ext: cool, no edema Neuro: No focal deficits  LABS: PULMONARY  Recent Labs Lab 08/16/14 0142 08/16/14 0253 08/17/14 0306 08/17/14 0425 08/17/14 1030 08/18/14 0324  PHART 7.238* 7.316* 7.508* 7.405  --  7.422  PCO2ART 49.9* 37.1 25.6* 33.7*  --  36.0  PO2ART 314.0* 59.0* 101.0* 80.0  --  83.2  HCO3 21.6 19.2* 20.3 21.1  --  23.0  TCO2 23 20 21 22   --  24.2  O2SAT 100.0 90.0 99.0 96.0 90.2 96.1    CBC  Recent Labs Lab 08/19/14 0330 08/20/14 0500 08/21/14 0344  HGB 9.5* 8.5* 8.3*  HCT 28.2* 25.1* 24.5*  WBC 18.1* 13.0* 9.3  PLT 181 158 142*    COAGULATION No results found for this basename: INR,  in the last 168 hours  CARDIAC  Recent Labs Lab 08/16/14 0900 08/16/14 1456 08/16/14 2200 08/17/14 0200 08/20/14 0500  TROPONINI >20.00* >20.00* >20.00* >20.00* 11.82*    Recent Labs Lab 08/15/14 1956 08/20/14 0500  PROBNP 2319.0* 51736.0*     CHEMISTRY  Recent Labs Lab 08/15/14 2144 08/16/14 0400 08/17/14 0400 08/18/14 0329 08/19/14 0330 08/20/14 0500 08/21/14 0344  NA  --  135* 134* 132* 136* 137 139  K  --  4.4 3.3* 3.3* 3.6* 3.3* 3.5*  CL  --  99 99 96 99 102 102  CO2  --  19 22 22 23 24 25    GLUCOSE  --  188* 128* 159* 161* 158* 116*  BUN  --  13 20 32* 31* 26* 22  CREATININE  --  1.24 2.12* 2.47* 2.22* 1.86* 1.78*  CALCIUM  --  8.1* 8.0* 8.2* 8.5 8.6 8.5  MG 1.9 4.9* 2.2 2.2 2.6*  --   --   PHOS 4.8* 3.9 1.5* 2.6 4.9*  --   --    Estimated Creatinine Clearance: 39.2 ml/min (by C-G formula based on Cr of 1.78).   LIVER  Recent Labs Lab 08/15/14 1957  AST 26  ALT 11  ALKPHOS 83  BILITOT 0.5  PROT 7.5  ALBUMIN 3.5     INFECTIOUS  Recent Labs Lab 08/16/14 0900 08/16/14 1500  LATICACIDVEN 3.0* 3.0*     ENDOCRINE CBG (last 3)   Recent Labs  08/22/14 0023 08/22/14 0410 08/22/14 0844  GLUCAP 171* 127* 119*         IMAGING x48h Dg Chest Port 1 View  08/21/2014   CLINICAL DATA:  Respiratory failure.  EXAM: PORTABLE CHEST - 1 VIEW  COMPARISON:  Single view of the chest 08/20/2014 and 08/19/2014.  FINDINGS: Endotracheal tube and feeding tube have been removed. Left IJ catheter remains in place. Pulmonary edema continues to improve. Small effusions and basilar airspace disease, right worse than left, have also improved. Heart size is normal. No pneumothorax is identified. Remote right clavicle fracture is noted.  IMPRESSION: Continued improvement in edema, effusions and basilar atelectasis.   Electronically Signed   By: Inge Rise M.D.   On: 08/21/2014 07:14        ASSESSMENT / PLAN:  PULMONARY ETT 8/27 >> 8/30, 8/31 >> 9/01 A:  Acute Respiratory Failure Recurrent Acute Pulmonary Edema  Pleural effusions COPD, O2 dependent PTA Mild wheezing   - worsened overnight and he is wheezing - ? Cardiac v copd v both  P:   SuppO2 to maintain SpO2 > 93% Cont nebulized steroids and BDs but increased frequency Start IV Steroids  pcxr improved, neg balance as able  CARDIOVASCULAR 8/27 Left IJ CVL 8/27 >>  A:  Acute MI  Inoperable coronary disease Cardiogenic shock Severe ischemic cardiomyopathy   - possibly in flash pulm edema 08/22/14 P:   Stat cxr and bnp check Mgmt per Cards - plavix, milrinone, lipitor, amio ? Needs lasix - cards to decide -informed Barrister's clerk   RENAL A:   AKI, nonoliguric    - Cr improving  P:   Monitor BMET intermittently Maintain neg balance Repeat lasix per cards  GASTROINTESTINAL A:   Chronic intermittent abd pain, concern for mesenteric ischemia Hematochezia, resolved   - no acute issues  P:   SUP: enteral pantoprazole diet Consider CTA abdomen/pelvis when clinically stable per GI recs  HEMATOLOGIC A:   Acute blood loss anemia, no active bleeding currently   -  Now with anemia of critical illness  P:  DVT px: SCDs (due to recent bleeding) Monitor CBC intermittently Transfuse per usual ICU guidelines; goal HGB is > 8gm% in setting of MI - will repeat 08/22/14  INFECTIOUS A:   No overt infectious process identified P:   Blood 8/28 >> NEG Sputum 8/28 >> NOF  Vanc 8/28 >> 8/31 Zosyn 8/28 >> 8/31  ENDOCRINE A:   Stress assoc hyperglycemia without prior dx of DM P:   Mod scale SSI  NEUROLOGIC A:   Denies pain or anxiety  P:   Low dose PRN fentanyl  GLOBAL 08/22/14: DNR per daughter noted but also notes indicate that need to have conversation with patient when he is more stable cognitively.HE is cognitively ok today but in resp distress so cannot have this conversation.  Will increase nebs, start steroids and get cards involved. PRimary team is [facility] and to consider Palliative care for goals of care  PCCM wil follow  D/w Dr Eliseo Squires of [facility]    Dr. Brand Males, M.D., [facility].C.P Pulmonary and Critical Care Medicine Staff Physician [facility] Pager: [phone], If no answer or between  15:00h - 7:00h: call [phone]  08/22/2014 9:32 AM

## 2014-08-22 NOTE — Consult Note (Signed)
Patient UX:[id number] Schuff      DOB: 11-23-1946      UVO:[id number]     Consult Note from the Palliative Medicine Team at [facility] Requested by: Dr Eliseo Squires    PCP: [name], MD Reason for Consultation:Goals of Care    Phone Number:[phone]  Assessment/Recommendations: 68 yo male with CAD/ICM, COPD who presented with abdominal pain (cocnern for mesenteric ischemia) who developed acute respiratoyr distress and NSTEMI requiring intubation. Hospital course complicated by ongoing resp failure with COPD and heart failure contributions, AKI.  Palliative Care consulted for goals of care.    1.  Code Status: DNR  2. Goals of Care: Mostly explored illness understanding today. Able to speak in generalities about his illness. Knows things are somewhat tenuous.  Remembers being on vent and would not want that to happen again.  He is hopeful of continued medical efforts and eventually getting back home to his apartment.  Aware he could have decline before then. Would be willing to explore rehab.  Some of his planning will be dependent on course over next hours to days and what happens with cardiac function/milrinone infusion.  His daughter Lala Lund is who he wants to act as POA and states he has paperwork stating this.  He will try to have her bring in.  She reportedly has her own health problems and does not want me or others calling her unless urgent needs arise.  Will continue to follow along and address goals as able. Suspect when he is ready for discharge that he would benefit from outpatient palliative care consultation.    3. Symptom Management: 1. Cough- thick secretions difficult to get up. Will add mucinex 2. Dyspnea- Steroids started today in additon to nebs, etc.  Doing better this afternoon. Monitor.  3. Gas/Bloating- Will add PRN simethicone.    4. Psychosocial/Spiritual: Lives at home with [facility].  Divorced. First wife is deceased (dghtr Vera's mom). Divorced from  2nd wife and has daughter in [place] from that marriage.  Close with his friend AL who lives next door to him.  Has a sister Violet who has significant health problems as well as a brother who he describes as "dying from lung cancer".  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  Enjoys watching westerns.     Brief HPI: 68 yo male with PMHx of CAD s/p stent, COPD, Acute on Chronic systolic HF, chronic abdominal pain concerning for mesenteric ischemia who presented on 08/13/14 with complaint of post-prandial abdominal pain and concern for GI bleed.  He developed CP and tachycardia on 8/27 with associated resp distress though 2/2 pulm edema.  He was intubated on 8/27.He underwent cadiac cath on 8/28 with severe 2 vessel CAD and ischemic CM noted (EF 5-10%). Proximal LAD stent placed at that time. IABP was needed to be placed. Repeat echo showed improved EF on 8/28 (30-35%). On 8/30 IABP removed and he was able to be extubated.  On 8/31 developed worsening respiratory status requiring repeat intubation.  Cardiology started on milrinone infusion for his heart failure. He was able to be extubated on 9/1.  Discussions with patient and daughter lead to DNR decision on 9/1.  Palliative Care consulted 9/3 for further exploration of goals.    In speaking with Mr Hoffmeier today, he is able to describe some of the big picture issues he is having related to MI, heart failure, breathing issues ongoing.  Trouble more with specifics.  Felt like he had a bad night with his breathing but doing much better this afternoon.  Has some bloating and gassy feelings. Cough bothersome with thick secretions. Denies Pain, anxiety/depression, N/V. Ate lunch this afternoon and appetite feeling good.  No constipation/diarrhea.   ROS: Full ROS negative unless otherwise mentioned above.     PMH:  Past Medical History  Diagnosis Date  . HYPERLIPIDEMIA 10/01/2009  . HYPERTENSION 10/01/2009  . CAD  10/01/2009    Stent at [facility] greater than 10 years ago  . PVD 10/01/2009  . ALLERGIC RHINITIS 12/24/2009  . COPD 10/01/2009  . OSTEOARTHRITIS, GENERALIZED, MULTIPLE JOINTS 12/24/2009     PSH: Past Surgical History  Procedure Laterality Date  . Fracture surgery  2012    ORIF r tibia fracture  . Tonsillectomy and adenoidectomy     I have reviewed the [place] and SH and  If appropriate update it with new information. No Known Allergies Scheduled Meds: . albuterol  2.5 mg Nebulization Once  . amiodarone  400 mg Oral Daily  . antiseptic oral rinse  7 mL Mouth Rinse BID  . aspirin  81 mg Oral Daily  . atorvastatin  80 mg Oral q1800  . budesonide (PULMICORT) nebulizer solution  0.5 mg Nebulization BID  . clopidogrel  75 mg Oral Daily  . feeding supplement (RESOURCE BREEZE)  1 Container Oral TID BM  . furosemide  40 mg Intravenous BID  . hydrocortisone   Rectal BID  . insulin aspart  0-15 Units Subcutaneous 6 times per day  . ipratropium-albuterol  3 mL Nebulization Q4H  . methylPREDNISolone (SOLU-MEDROL) injection  60 mg Intravenous 3 times per day  . sodium chloride  3 mL Intravenous Q12H  . sodium chloride  3 mL Intravenous Q12H  . triamcinolone cream   Topical BID   Continuous Infusions: . sodium chloride 7 mL/hr at 08/21/14 1200  . milrinone 0.125 mcg/kg/min (08/22/14 0647)   PRN Meds:.sodium chloride, fentaNYL, sodium chloride    BP 100/63  Pulse 118  Temp(Src) 97.6 F (36.4 C) (Oral)  Resp 22  Ht 5\' 7"  (1.702 m)  Wt 80 kg (176 lb 5.9 oz)  BMI 27.62 kg/m2  SpO2 99%   PPS: Currently 40-50  Intake/Output Summary (Last 24 hours) at 08/22/14 1306 Last data filed at 08/22/14 1008  Gross per 24 hour  Intake    483 ml  Output      0 ml  Net    483 ml    Physical Exam:  General: Alert, NAD HEENT:  , sclera anicteric, mmm Neck: supple, left IJ catheter Chest:   Exp wheezes, symm exp DPO:EUMP tachy, regular Abdomen: soft, NT, ND Ext: mild lower ext edema Neuro:  appropriately follwos commands and moves all 4 ext Skin: warm/dry Lymph: no palpable cervical adenopathy  Labs: CBC    Component Value Date/Time   WBC 11.5* 08/22/2014 1150   RBC 2.83* 08/22/2014 1150   HGB 8.2* 08/22/2014 1150   HCT 24.9* 08/22/2014 1150   PLT 189 08/22/2014 1150   MCV 88.0 08/22/2014 1150   MCH 29.0 08/22/2014 1150   MCHC 32.9 08/22/2014 1150   RDW 14.9 08/22/2014 1150   LYMPHSABS 0.5* 08/19/2014 0330   MONOABS 1.2* 08/19/2014 0330   EOSABS 0.0 08/19/2014 0330   BASOSABS 0.0 08/19/2014 0330    BMET    Component Value Date/Time   NA 140 08/22/2014 1150   K 4.2 08/22/2014 1150   CL 102 08/22/2014 1150   CO2 25  08/22/2014 1150   GLUCOSE 102* 08/22/2014 1150   BUN 24* 08/22/2014 1150   CREATININE 1.79* 08/22/2014 1150   CALCIUM 8.4 08/22/2014 1150   GFRNONAA 37* 08/22/2014 1150   GFRAA 43* 08/22/2014 1150    CMP     Component Value Date/Time   NA 140 08/22/2014 1150   K 4.2 08/22/2014 1150   CL 102 08/22/2014 1150   CO2 25 08/22/2014 1150   GLUCOSE 102* 08/22/2014 1150   BUN 24* 08/22/2014 1150   CREATININE 1.79* 08/22/2014 1150   CALCIUM 8.4 08/22/2014 1150   PROT 6.2 08/22/2014 1150   ALBUMIN 2.4* 08/22/2014 1150   AST 37 08/22/2014 1150   ALT 27 08/22/2014 1150   ALKPHOS 112 08/22/2014 1150   BILITOT 0.4 08/22/2014 1150   GFRNONAA 37* 08/22/2014 1150   GFRAA 43* 08/22/2014 1150    8/25 CT ABD/PELVIS IMPRESSION:  No acute findings in the abdomen/pelvis.  Moderate diverticulosis throughout the colon without active  inflammation.  Mild cholelithiasis.  8/28 Echo Impressions:  - Since last echo of 08/15/14, there is now severe hypokinesis of the apex and distal anteroseptal and distal inferior myocardium. Consider apical ballooning syndrome.  9/3 CXR IMPRESSION:  1. Congestive heart failure with bilateral pulmonary edema.  Pulmonary has progressed from prior exam. Small pleural effusions  cannot be excluded.  2. Left IJ line in stable position.    Greater than 50%  of this time was spent  counseling and coordinating care related to the above assessment and plan.   Doran Clay D.O. Palliative Medicine Team at [facility]  Pager: [phone] Team Phone: [phone]

## 2014-08-22 NOTE — Evaluation (Signed)
Physical Therapy Evaluation Patient Details Name: Douglas Mann MRN: [id number] DOB: [birth year] Today's Date: 08/22/2014   History of Present Illness  Pt adm to [facility] 8/25 with GI bleed. Developed severe chest pain 8/27 and found to have NSTEMI and required intubation and transferred to [facility]. Pt extubated on 8/30 and reintubated 8/31. Extubated 9/2. PMH - O2 dependent COPD, HTN  Clinical Impression  Pt admitted with above. Pt currently with functional limitations due to the deficits listed below (see PT Problem List).  Pt will benefit from skilled PT to increase their independence and safety with mobility to allow discharge to the venue listed below. Pt with functional decline in mobility since evaluated at [facility] prior to intubation. Pt has supportive friend that can assist some at home but not likely 24 hour. Feel pt will need ST-SNF prior to return home.      Follow Up Recommendations SNF    Equipment Recommendations  None recommended by PT    Recommendations for Other Services       Precautions / Restrictions Precautions Precautions: Fall Precaution Comments: monitor SaO2 and RR      Mobility  Bed Mobility Overal bed mobility: Needs Assistance Bed Mobility: Supine to Sit;Sit to Supine     Supine to sit: Mod assist;HOB elevated Sit to supine: Min assist   General bed mobility comments: Assist to bring trunk up into sitting. Assist to bring feet back up into bed when returning to supine.  Transfers Overall transfer level: Needs assistance Equipment used: Rolling walker (2 wheeled) Transfers: Sit to/from Stand Sit to Stand: Min assist         General transfer comment: Assist to bring hips up and for balance.  Ambulation/Gait Ambulation/Gait assistance: Min assist Ambulation Distance (Feet): 2 Feet (sidestepping) Assistive device: Rolling walker (2 wheeled) Gait Pattern/deviations: Step-to pattern     General Gait Details: Pt side-stepped up side of bed. SaO2 95% on 5L.  Incr RR with mobility but able to decr respiratory rate with verbal cues and pursed lip breathing.  Stairs            Wheelchair Mobility    Modified Rankin (Stroke Patients Only)       Balance Overall balance assessment: Needs assistance Sitting-balance support: Feet supported;Bilateral upper extremity supported Sitting balance-Leahy Scale: Poor Sitting balance - Comments: UE support seems primarily due to stabilization for respirations.   Standing balance support: Bilateral upper extremity supported Standing balance-Leahy Scale: Poor Standing balance comment: Stood with walker x 3 minutes with min guard                             Pertinent Vitals/Pain Pain Assessment: No/denies pain    Home Living Family/patient expects to be discharged to:: Private residence Living Arrangements: Alone Available Help at Discharge: Friend(s);Available PRN/intermittently Type of Home: Apartment Home Access: Stairs to enter   Entrance Stairs-Number of Steps: 1 Home Layout: One level Home Equipment: Walker - 2 wheels;Cane - single point;Crutches      Prior Function Level of Independence: Independent               Hand Dominance        Extremity/Trunk Assessment   Upper Extremity Assessment: Generalized weakness           Lower Extremity Assessment: Generalized weakness         Communication   Communication: No difficulties  Cognition Arousal/Alertness: Awake/alert Behavior During Therapy: WFL for tasks assessed/performed  Overall Cognitive Status: Within Functional Limits for tasks assessed                      General Comments      Exercises        Assessment/Plan    PT Assessment Patient needs continued PT services  PT Diagnosis Difficulty walking;Generalized weakness   PT Problem List Decreased strength;Decreased activity tolerance;Decreased balance;Decreased mobility;Cardiopulmonary status limiting activity  PT Treatment  Interventions DME instruction;Gait training;Functional mobility training;Therapeutic activities;Therapeutic exercise;Balance training;Patient/family education   PT Goals (Current goals can be found in the Care Plan section) Acute Rehab PT Goals Patient Stated Goal: Return home PT Goal Formulation: With patient Time For Goal Achievement: 09/05/14 Potential to Achieve Goals: Good    Frequency Min 3X/week   Barriers to discharge Decreased caregiver support      Co-evaluation               End of Session Equipment Utilized During Treatment: Oxygen Activity Tolerance: Patient limited by fatigue Patient left: in bed;with call bell/phone within reach;with bed alarm set Nurse Communication: Mobility status         Time: 3710-6269 PT Time Calculation (min): 22 min   Charges:   PT Evaluation $Initial PT Evaluation Tier I: 1 Procedure PT Treatments $Gait Training: 8-22 mins   PT G Codes:          Azara Gemme 08-30-14, 4:45 PM  [facility] PT [phone]

## 2014-08-23 ENCOUNTER — Inpatient Hospital Stay (HOSPITAL_COMMUNITY): Payer: Medicare Other

## 2014-08-23 LAB — CBC WITH DIFFERENTIAL/PLATELET
BASOS ABS: 0 10*3/uL (ref 0.0–0.1)
Basophils Relative: 0 % (ref 0–1)
EOS ABS: 0 10*3/uL (ref 0.0–0.7)
EOS PCT: 0 % (ref 0–5)
HEMATOCRIT: 23.7 % — AB (ref 39.0–52.0)
Hemoglobin: 7.9 g/dL — ABNORMAL LOW (ref 13.0–17.0)
Lymphocytes Relative: 6 % — ABNORMAL LOW (ref 12–46)
Lymphs Abs: 0.6 10*3/uL — ABNORMAL LOW (ref 0.7–4.0)
MCH: 29.5 pg (ref 26.0–34.0)
MCHC: 33.3 g/dL (ref 30.0–36.0)
MCV: 88.4 fL (ref 78.0–100.0)
Monocytes Absolute: 0.6 10*3/uL (ref 0.1–1.0)
Monocytes Relative: 5 % (ref 3–12)
Neutro Abs: 10.5 10*3/uL — ABNORMAL HIGH (ref 1.7–7.7)
Neutrophils Relative %: 89 % — ABNORMAL HIGH (ref 43–77)
Platelets: 202 10*3/uL (ref 150–400)
RBC: 2.68 MIL/uL — ABNORMAL LOW (ref 4.22–5.81)
RDW: 14.5 % (ref 11.5–15.5)
WBC: 11.7 10*3/uL — ABNORMAL HIGH (ref 4.0–10.5)

## 2014-08-23 LAB — GLUCOSE, CAPILLARY
GLUCOSE-CAPILLARY: 196 mg/dL — AB (ref 70–99)
Glucose-Capillary: 133 mg/dL — ABNORMAL HIGH (ref 70–99)
Glucose-Capillary: 298 mg/dL — ABNORMAL HIGH (ref 70–99)
Glucose-Capillary: 162 mg/dL — ABNORMAL HIGH (ref 70–99)

## 2014-08-23 LAB — BASIC METABOLIC PANEL
ANION GAP: 11 (ref 5–15)
BUN: 29 mg/dL — AB (ref 6–23)
CO2: 26 meq/L (ref 19–32)
CREATININE: 1.75 mg/dL — AB (ref 0.50–1.35)
Calcium: 8.5 mg/dL (ref 8.4–10.5)
Chloride: 99 mEq/L (ref 96–112)
GFR calc Af Amer: 44 mL/min — ABNORMAL LOW (ref >90)
GFR calc non Af Amer: 38 mL/min — ABNORMAL LOW (ref >90)
Glucose, Bld: 154 mg/dL — ABNORMAL HIGH (ref 70–99)
Potassium: 3.9 mEq/L (ref 3.7–5.3)
Sodium: 136 mEq/L — ABNORMAL LOW (ref 137–147)

## 2014-08-23 LAB — MAGNESIUM
Magnesium: 2.3 mg/dL (ref 1.5–2.5)
Magnesium: 2.5 mg/dL (ref 1.5–2.5)

## 2014-08-23 LAB — BASIC METABOLIC PANEL WITH GFR
Anion gap: 13 (ref 5–15)
BUN: 38 mg/dL — ABNORMAL HIGH (ref 6–23)
CO2: 26 meq/L (ref 19–32)
Calcium: 8.2 mg/dL — ABNORMAL LOW (ref 8.4–10.5)
Chloride: 98 meq/L (ref 96–112)
Creatinine, Ser: 2.17 mg/dL — ABNORMAL HIGH (ref 0.50–1.35)
GFR calc Af Amer: 34 mL/min — ABNORMAL LOW
GFR calc non Af Amer: 29 mL/min — ABNORMAL LOW
Glucose, Bld: 148 mg/dL — ABNORMAL HIGH (ref 70–99)
Potassium: 4 meq/L (ref 3.7–5.3)
Sodium: 137 meq/L (ref 137–147)

## 2014-08-23 LAB — PHOSPHORUS: Phosphorus: 2.6 mg/dL (ref 2.3–4.6)

## 2014-08-23 MED ORDER — LEVOFLOXACIN 750 MG PO TABS
750.0000 mg | ORAL_TABLET | ORAL | Status: AC
  Administered 2014-08-25 – 2014-08-29 (×3): 750 mg via ORAL
  Filled 2014-08-23 (×5): qty 1

## 2014-08-23 MED ORDER — AMIODARONE HCL 200 MG PO TABS
200.0000 mg | ORAL_TABLET | Freq: Every day | ORAL | Status: DC
  Administered 2014-08-23 – 2014-08-24 (×2): 200 mg via ORAL
  Filled 2014-08-23 (×2): qty 1

## 2014-08-23 MED ORDER — FUROSEMIDE 10 MG/ML IJ SOLN: 20.0000 mg | Freq: Once | INTRAMUSCULAR | Status: DC

## 2014-08-23 MED ORDER — FUROSEMIDE 10 MG/ML IJ SOLN
40.0000 mg | Freq: Once | INTRAMUSCULAR | Status: AC
  Administered 2014-08-23: 40 mg via INTRAVENOUS

## 2014-08-23 MED ORDER — MORPHINE SULFATE 2 MG/ML IJ SOLN
1.0000 mg | INTRAMUSCULAR | Status: DC | PRN
  Administered 2014-08-23: 1 mg via INTRAVENOUS

## 2014-08-23 MED ORDER — FLUTICASONE PROPIONATE 50 MCG/ACT NA SUSP
2.0000 | Freq: Every day | NASAL | Status: DC
  Administered 2014-08-24 – 2014-09-03 (×11): 2 via NASAL
  Filled 2014-08-23 (×2): qty 16

## 2014-08-23 MED ORDER — LORAZEPAM 2 MG/ML IJ SOLN
0.5000 mg | Freq: Once | INTRAMUSCULAR | Status: AC
  Administered 2014-08-23: 0.5 mg via INTRAVENOUS

## 2014-08-23 MED ORDER — LORAZEPAM 2 MG/ML IJ SOLN
INTRAMUSCULAR | Status: AC
  Filled 2014-08-23: qty 1

## 2014-08-23 MED ORDER — INSULIN ASPART 100 UNIT/ML ~~LOC~~ SOLN
0.0000 [IU] | SUBCUTANEOUS | Status: DC
  Administered 2014-08-24: 5 [IU] via SUBCUTANEOUS
  Administered 2014-08-24 (×3): 3 [IU] via SUBCUTANEOUS
  Administered 2014-08-24: 8 [IU] via SUBCUTANEOUS
  Administered 2014-08-24 (×2): 2 [IU] via SUBCUTANEOUS
  Administered 2014-08-25 (×4): 3 [IU] via SUBCUTANEOUS

## 2014-08-23 MED ORDER — CAPTOPRIL 6.25 MG HALF TABLET
6.2500 mg | ORAL_TABLET | Freq: Three times a day (TID) | ORAL | Status: DC
  Administered 2014-08-23 – 2014-08-24 (×3): 6.25 mg via ORAL
  Filled 2014-08-23 (×9): qty 1

## 2014-08-23 MED ORDER — INSULIN ASPART 100 UNIT/ML ~~LOC~~ SOLN: 0.0000 [IU] | Freq: Three times a day (TID) | SUBCUTANEOUS | Status: DC

## 2014-08-23 MED ORDER — INSULIN ASPART 100 UNIT/ML ~~LOC~~ SOLN: 0.0000 [IU] | Freq: Every day | SUBCUTANEOUS | Status: DC

## 2014-08-23 MED ORDER — AMIODARONE IV BOLUS ONLY 150 MG/100ML
150.0000 mg | Freq: Once | INTRAVENOUS | Status: AC
  Administered 2014-08-23: 150 mg via INTRAVENOUS
  Filled 2014-08-23: qty 100

## 2014-08-23 MED ORDER — ALBUTEROL SULFATE (2.5 MG/3ML) 0.083% IN NEBU
INHALATION_SOLUTION | RESPIRATORY_TRACT | Status: AC
  Filled 2014-08-23: qty 3

## 2014-08-23 MED ORDER — SODIUM CHLORIDE 0.9 % IV SOLN: Freq: Once | INTRAVENOUS | Status: DC

## 2014-08-23 MED ORDER — LORAZEPAM 2 MG/ML IJ SOLN
1.0000 mg | INTRAMUSCULAR | Status: DC | PRN
  Administered 2014-08-23 – 2014-09-02 (×7): 1 mg via INTRAVENOUS
  Filled 2014-08-23 (×7): qty 1

## 2014-08-23 MED ORDER — AMIODARONE HCL IN DEXTROSE 360-4.14 MG/200ML-% IV SOLN
INTRAVENOUS | Status: AC
  Filled 2014-08-23: qty 200

## 2014-08-23 MED ORDER — MORPHINE SULFATE 2 MG/ML IJ SOLN
INTRAMUSCULAR | Status: AC
  Filled 2014-08-23: qty 1

## 2014-08-23 MED ORDER — HYDROMORPHONE HCL PF 1 MG/ML IJ SOLN
0.4000 mg | INTRAMUSCULAR | Status: DC | PRN
  Administered 2014-08-23: 0.4 mg via INTRAVENOUS
  Administered 2014-08-24 – 2014-08-26 (×2): 0.8 mg via INTRAVENOUS
  Filled 2014-08-23 (×3): qty 1

## 2014-08-23 MED ORDER — FUROSEMIDE 10 MG/ML IJ SOLN
40.0000 mg | Freq: Every day | INTRAMUSCULAR | Status: DC
  Administered 2014-08-24: 40 mg via INTRAVENOUS
  Filled 2014-08-23 (×2): qty 4

## 2014-08-23 NOTE — Progress Notes (Signed)
ANTIBIOTIC CONSULT NOTE - INITIAL  Pharmacy Consult for levaquin Indication: pneumonia  No Known Allergies  Patient Measurements: Height: 5\' 7"  (170.2 cm) Weight: 176 lb 5.9 oz (80 kg) IBW/kg (Calculated) : 66.1  Vital Signs: Temp: 98.6 F (37 C) (09/04 0411) Temp src: Oral (09/04 0411) BP: 102/63 mmHg (09/04 0411) Pulse Rate: 114 (09/04 0411) Intake/Output from previous day: 09/03 0701 - 09/04 0700 In: 483 [P.O.:480; I.V.:3] Out: 1100 [Urine:1100] Intake/Output from this shift:    Labs:  Recent Labs  08/21/14 0344 08/22/14 1150 08/23/14 0523  WBC 9.3 11.5* 11.7*  HGB 8.3* 8.2* 7.9*  PLT 142* 189 202  CREATININE 1.78* 1.79* 1.75*   Estimated Creatinine Clearance: 39.8 ml/min (by C-G formula based on Cr of 1.75). No results found for this basename: VANCOTROUGH, Corlis Leak, VANCORANDOM, [place], GENTPEAK, Hanna, [place], TOBRAPEAK, TOBRARND, AMIKACINPEAK, AMIKACINTROU, AMIKACIN,  in the last 72 hours   Microbiology: Recent Results (from the past 720 hour(s))  MRSA PCR SCREENING     Status: None   Collection Time    08/16/14  1:59 AM      Result Value Ref Range Status   MRSA by PCR NEGATIVE  NEGATIVE Final   Comment:            The GeneXpert MRSA Assay (FDA     approved for NASAL specimens     only), is one component of a     comprehensive MRSA colonization     surveillance program. It is not     intended to diagnose MRSA     infection nor to guide or     monitor treatment for     MRSA infections.  CULTURE, RESPIRATORY (NON-EXPECTORATED)     Status: None   Collection Time    08/16/14 11:48 AM      Result Value Ref Range Status   Specimen Description TRACHEAL ASPIRATE   Final   Special Requests NONE   Final   Gram Stain     Final   Value: FEW WBC PRESENT,BOTH PMN AND MONONUCLEAR     RARE SQUAMOUS EPITHELIAL CELLS PRESENT     FEW GRAM POSITIVE COCCI     IN PAIRS IN CLUSTERS RARE GRAM NEGATIVE RODS     Performed at [facility]    Culture     Final   Value: Non-Pathogenic Oropharyngeal-type Flora Isolated.     Performed at [facility]   Report Status 08/18/2014 FINAL   Final  CULTURE, BLOOD (ROUTINE X 2)     Status: None   Collection Time    08/16/14 12:25 PM      Result Value Ref Range Status   Specimen Description BLOOD RIGHT HAND   Final   Special Requests BOTTLES DRAWN AEROBIC AND ANAEROBIC 5CC   Final   Culture  Setup Time     Final   Value: 08/16/2014 17:16     Performed at [facility]   Culture     Final   Value: NO GROWTH 5 DAYS     Performed at [facility]   Report Status 08/22/2014 FINAL   Final  CULTURE, BLOOD (ROUTINE X 2)     Status: None   Collection Time    08/16/14 12:40 PM      Result Value Ref Range Status   Specimen Description BLOOD LEFT HAND   Final   Special Requests BOTTLES DRAWN AEROBIC AND ANAEROBIC 5CC   Final   Culture  Setup Time  Final   Value: 08/16/2014 17:16     Performed at [facility]   Culture     Final   Value: NO GROWTH 5 DAYS     Performed at [facility]   Report Status 08/22/2014 FINAL   Final  CLOSTRIDIUM DIFFICILE BY PCR     Status: None   Collection Time    08/20/14  5:06 PM      Result Value Ref Range Status   C difficile by pcr NEGATIVE  NEGATIVE Final    Medical History: Past Medical History  Diagnosis Date  . HYPERLIPIDEMIA 10/01/2009  . HYPERTENSION 10/01/2009  . CAD 10/01/2009    Stent at [facility] greater than 10 years ago  . PVD 10/01/2009  . ALLERGIC RHINITIS 12/24/2009  . COPD 10/01/2009  . OSTEOARTHRITIS, GENERALIZED, MULTIPLE JOINTS 12/24/2009    Assessment: 68 yo male with complicated hospital course initially for GIB, acute respiratory failure, STEMI s/p emergently cath and IABP (removed 8/30), now extubated, but still having significant respiratory distress. Pharmacy is consulted to start levaquin empiric for CAP. He is afebrile, wbc 11.7 (on steroids). Scr 1.75, est. crcl ~ 35-40  ml/min.   Plan:  - Levaquin 750 mg PO Q 48 hrs x 8 days. - F/u clinical course and renal funciton.  Maryanna Shape, PharmD, BCPS  Clinical Pharmacist  Pager: [phone]   08/23/2014,10:14 AM

## 2014-08-23 NOTE — Progress Notes (Signed)
Called by RN to administer neb tx. Pt c/o SOB, HR 140, SpO2 90% on 4L [place] upon arrival. No prn tx ordered, Albuterol overridden in pyxis to administer. Pt states he was moving around, got SOB, unable to recover from it. Bilateral expiratory wheeze heard (more on L) as well as fine crackles. Pt receiving neb tx at this time. RT will continue to monitor.

## 2014-08-23 NOTE — Progress Notes (Addendum)
Patient IO:[id number] Friedel      DOB: [birth year]      UXL:[id number]   Palliative Medicine Team at [facility] Progress Note    Subjective: Unfortunately had rapid respiratory decline this morning with transfer to [place].  Extremely tachypnic this afternoon and hard to talk.  Being placed on BiPAP which he agreed to.  Admits to feeling dyspneic, denies pain. Spoke with friend Al at bedside and daughter Vanita Ingles over phone    Filed Vitals:   08/23/14 1300  BP: 102/73  Pulse: 134  Temp:   Resp: 28   Physical exam: GEN: moderate resp distress, conversational dyspnea HEENT: [place], sclera anicteric CV: Tachycardia LUNGS: Increased wheezing, accessory resp muscle use, symm expansion ABD: soft, NT, ND EXT: no cyanosis  CBC    Component Value Date/Time   WBC 11.7* 08/23/2014 0523   RBC 2.68* 08/23/2014 0523   HGB 7.9* 08/23/2014 0523   HCT 23.7* 08/23/2014 0523   PLT 202 08/23/2014 0523   MCV 88.4 08/23/2014 0523   MCH 29.5 08/23/2014 0523   MCHC 33.3 08/23/2014 0523   RDW 14.5 08/23/2014 0523   LYMPHSABS 0.6* 08/23/2014 0523   MONOABS 0.6 08/23/2014 0523   EOSABS 0.0 08/23/2014 0523   BASOSABS 0.0 08/23/2014 0523    CMP     Component Value Date/Time   NA 136* 08/23/2014 0523   K 3.9 08/23/2014 0523   CL 99 08/23/2014 0523   CO2 26 08/23/2014 0523   GLUCOSE 154* 08/23/2014 0523   BUN 29* 08/23/2014 0523   CREATININE 1.75* 08/23/2014 0523   CALCIUM 8.5 08/23/2014 0523   PROT 6.2 08/22/2014 1150   ALBUMIN 2.4* 08/22/2014 1150   AST 37 08/22/2014 1150   ALT 27 08/22/2014 1150   ALKPHOS 112 08/22/2014 1150   BILITOT 0.4 08/22/2014 1150   GFRNONAA 38* 08/23/2014 0523   GFRAA 44* 08/23/2014 0523    08/23/14 CXR IMPRESSION:  Persistent bilateral mild congestion/edema. Probable bilateral small  pleural effusion with bilateral basilar atelectasis or infiltrate.    Assessment and plan: 68 yo male with CAD/ICM, COPD who presented with abdominal pain (cocnern for mesenteric ischemia) who developed acute respiratoyr distress and NSTEMI  requiring intubation. Hospital course complicated by ongoing resp failure with COPD and heart failure contributions, AKI. Palliative Care consulted for goals of care.   1. Code Status: DNR   2. Goals of Care:  See initial consultation. Unfortunately drastic decline this morning.  He is going to trial BiPAP to see if this can stabilize his breathing. I encouraged him to let us know if this becomes too much and he would rather focus on comfort. Spoke with friend Al at bedside and daughter Vanita Ingles over phone.  Let them know of tenuous status with hopes of some improvement w/BiPAP but high risk for further decline.  If decline, or if Mr Zentner can not tolerate BiPAP they agree with transitioning to comfort care. See recs below for management.  Vanita Ingles is calling family from out of state so they are aware and have hopefully chance to see him should BiPAP not meet goals.    3. Symptom Management:  1. Dyspnea- change PRN morphine to dilaudid with renal dysfunction. Trial of BiPAP to see if improvement.   If unable to tolerate BiPAP or further decline and family wishes to pursue comfort, would recommend plan for comfort care as follows: - Would d/c all current meds including milrinone.  - would transition to [place] at 6L for comfort and not titrate  to O2 sat - Would start dilaudid infusion at 0.5-1.0mg /hr with 1mg  IV bolus q79min PRN - ativan PRN dyspnea - Atropine gtt's Prn secretions - If transition to comfort care, suspect time will be short and likely in hours given degree of resp decline.   4. Psychosocial/Spiritual: Lives at home with [facility]. Divorced. First wife is deceased (dghtr Vera's mom). Divorced from 2nd wife and has daughter in [place] from that marriage. Close with his friend AL who lives next door to him. Has a sister Violet who has significant health problems as well as a brother who he describes as "dying from lung cancer". 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 Enjoys watching westerns.   Time In: 1250 Time Out: 120 Total Time: 30 minutes >50% of time spent in counseling and coordination of care regarding above plan  Doran Clay D.O. Palliative Medicine Team at [facility]  Pager: [phone] Team Phone: [phone]   ADDENDUM: Saw again this afternoon.  He was unable to tolerate BiPAP long and now on NRB.  Doing a bit better than earlier prior to me seeing him in BiPAP. He confirms that he does not want to go back on BiPAP if resp status declines again. Daughter still not at bedside. If declines again overnight would switch to comfort care as above.  Will continue to explore goals with him based on trajectory. Suspect he will likely have further decline here. I think he is too tenuous at the moment to consider transfer out of hospital (ie to hospice facility).   Doran Clay D.O. Palliative Medicine Team at [facility]  Pager: [phone] Team Phone: [phone]

## 2014-08-23 NOTE — Progress Notes (Signed)
Pt continues to have trouble breathing after administering ordered meds.  Pt will be transferred to [facility].  Report will be called to receiving nurse.

## 2014-08-23 NOTE — Progress Notes (Signed)
       Patient Name: Douglas Mann Date of Encounter: 08/23/2014    SUBJECTIVE: The patient complains of dyspnea but improved compared to yesterday; no chest pain  TELEMETRY:  Sinus rhythm with intermittent abberrancy  Filed Vitals:   08/22/14 1559 08/22/14 2014 08/23/14 0408 08/23/14 0411  BP:  101/57  102/63  Pulse:  72  114  Temp:  99.5 F (37.5 C)  98.6 F (37 C)  TempSrc:  Oral  Oral  Resp:  21  20  Height:      Weight:      SpO2: 97% 97% 97%     Intake/Output Summary (Last 24 hours) at 08/23/14 0806 Last data filed at 08/23/14 0500  Gross per 24 hour  Intake    243 ml  Output   1100 ml  Net   -857 ml   LABS: Basic Metabolic Panel:  Recent Labs  08/22/14 1150 08/23/14 0523  NA 140 136*  K 4.2 3.9  CL 102 99  CO2 25 26  GLUCOSE 102* 154*  BUN 24* 29*  CREATININE 1.79* 1.75*  CALCIUM 8.4 8.5  MG  --  2.3  PHOS  --  2.6   CBC:  Recent Labs  08/22/14 1150 08/23/14 0523  WBC 11.5* 11.7*  NEUTROABS  --  10.5*  HGB 8.2* 7.9*  HCT 24.9* 23.7*  MCV 88.0 88.4  PLT 189 202     Radiology/Studies:  Chest x-ray showing continued improvement in pulmonary edema and effusions 08/21/14  Physical Exam: Blood pressure 102/63, pulse 114, temperature 98.6 F (37 C), temperature source Oral, resp. rate 20, height 5\' 7"  (1.702 m), weight 176 lb 5.9 oz (80 kg), SpO2 97.00%. Weight change:   Wt Readings from Last 3 Encounters:  08/21/14 176 lb 5.9 oz (80 kg)  08/21/14 176 lb 5.9 oz (80 kg)  08/21/14 176 lb 5.9 oz (80 kg)   HEENT normal Neck supple CV RRR Diminished breath sounds bilaterally with faint rales. Abd: soft Neuro exam is unremarkable. Ext no edema  ASSESSMENT:  1. Acute on chronic systolic heart failure/ICM-Dyspnea improving this AM. This is most likely multifactorial including severe COPD with superimposed congestive heart failure. Steroids have been initiated. Change lasix to 40 mg IV daily and follow renal function. Continue milrinone and  wean in next 24-48 hrs. Add low-dose hydralazine later if BP allows. Will try low dose captopril to see if he tolerates (BP and renal function); if so, transition to lisinopril later. No beta blocker given COPD. 2. Severe oxygen requiring COPD-Continue pulmonary toilet per pulmonary. 3. CAD-Status post salvage LAD stent in the setting of cardiogenic shock and pulmonary edema, with a nice angiographic result and now gradual improvement. Continue aspirin, Plavix and statin. 4. Ventricular tachycardia, suppressed with amiodarone (change to 200 mg daily) 5 chronic kidney disease-watch renal function closely with diuresis and addition of ACEI Patient's prognosis is poor. Agree with palliative care consult. Patient is a no CODE BLUE. Alex Gardener 08/23/2014, 8:06 AM

## 2014-08-23 NOTE — Progress Notes (Signed)
Pt requesting to go on 100% NRB. Pt extremely dyspneic and diaphoretic. Pt placed on mask, O2 sats around 90%. RN to administer Ativan. RT will continue to monitor.

## 2014-08-23 NOTE — Progress Notes (Signed)
SW given update regarding pt.'s disposition. Pt would benefit from a SNF placement but has refused. Pt has declined while at [facility] and has been transferred to [facility]. SW unable to speak with pt. SW will attempt to speak to pt again regarding SNF placement.   Charlene Brooke, MSW Clinical Social Worker [phone]

## 2014-08-23 NOTE — Clinical Social Work Note (Signed)
Clinical Social Worker received referral for possible ST-SNF placement.  Chart reviewed.  Patient with respiratory decline and transferred to [facility] stepdown unit.  CSW to update covering CSW of patient potential need for SNF once medically stable.  Full assessment to be completed at a later time.   Barbette Or, [place]

## 2014-08-23 NOTE — Progress Notes (Signed)
PULMONARY / CRITICAL CARE MEDICINE   Name: Douglas Mann MRN: [id number] DOB: 1946/09/01    ADMISSION DATE:  08/13/2014 CONSULTATION DATE:  08/15/2014   REFERRING MD :   [facility]  INITIAL PRESENTATION:   68 y/o male admitted on 8/25 for BRBPR and abdominal pain who developed chest pain, NSTEMI, VT on 8/27.  He was intubated and went to the cath lab where he was found to have a markedly elevated LVEDP.  EVENTS  8/25 Admitted with complaints of abd pain, BRBPR 8/25 CT abs/pelvis: no acute findings 8/27 Developed CP with c/o dysphagia 8/27 Echocardiogram: The cavity size was normal. Wall thickness was increased in a pattern of mild LVH. Systolic function was normal. The estimated ejection fraction was in the range of 55% to 60%. Wall motion was normal; there were no regional wall motion abnormalities. The LA was mildly dilated 8/27 Cards consult 8/27 developed resp distress, diaphoresis, CP, pulm edema. Positive cardiac markers 8/27 PCCM consult and intubation 8/28 early AM: urgent cath: Severe 2 vessel CAD with 100% occluded RCA, LAD 90% stenosed, severe ischemic cardiomyopathy LVEF 5-10% 8/28 AM IABP placed 8/28 Echocardiogram: LVEF 30-35% 8/28 GI consult: Concern for possible chronic mesenteric ischemia. Consider CTA abdomen/pelvis when clinically stable 8/30 IABP removed and extubated 8/31 early AM: re-intubated. Edema pattern on CXR. Milrinone initiated by Cardiology 9/01 Extubated. Remained slightly encephalopathic due to residual sedating medications. Discussed re-intubation status with pt (who did not seem to fully grasp the conversation) and his daughter who feels certain that he would not wish to undergo repeat intubation or ACLS in this situation where there are very limited treatment options and where recurrent respiratory fialure would be indicative of failure of those few options. EXTUBATED 08/20/14  08/22/14: Now on floor bed.  Significant respiratory distress overnight with wheezing  and crack;es and Rx with morphine, nebs and o2. This am still c/o dyspnea but much better. Has NRB on.CXR witth worsening pulm edema and cards restarted lasix    SUBJECTIVE/OVERNIGHT/INTERVAL HX 08/23/14: better but still wheezing despite nebs   VITAL SIGNS: Temp:  [98.3 F (36.8 C)-99.5 F (37.5 C)] 98.6 F (37 C) (09/04 0411) Pulse Rate:  [72-114] 114 (09/04 0411) Resp:  [19-21] 20 (09/04 0411) BP: (97-102)/(57-64) 102/63 mmHg (09/04 0411) SpO2:  [92 %-99 %] 92 % (09/04 0849) FiO2 (%):  [100 %] 100 % (09/03 1246)  HEMODYNAMICS:    VENTILATOR SETTINGS: Vent Mode:  [-]  FiO2 (%):  [100 %] 100 %  INTAKE / OUTPUT:  Intake/Output Summary (Last 24 hours) at 08/23/14 0952 Last data filed at 08/23/14 0500  Gross per 24 hour  Intake    243 ml  Output   1100 ml  Net   -857 ml   PHYSICAL EXAMINATION: General:  RASS 0, follows commands, Looks better HEENT: WNL. Nasal cannula o2 on PULM: NRB on . No distress but wheezing + significant, expiration prolonged CV: tachy 114, no M AB: BS+, soft Ext: cool, no edema Neuro: No focal deficits  LABS: PULMONARY  Recent Labs Lab 08/17/14 0306 08/17/14 0425 08/17/14 1030 08/18/14 0324  PHART 7.508* 7.405  --  7.422  PCO2ART 25.6* 33.7*  --  36.0  PO2ART 101.0* 80.0  --  83.2  HCO3 20.3 21.1  --  23.0  TCO2 21 22  --  24.2  O2SAT 99.0 96.0 90.2 96.1    CBC  Recent Labs Lab 08/21/14 0344 08/22/14 1150 08/23/14 0523  HGB 8.3* 8.2* 7.9*  HCT 24.5* 24.9* 23.7*  WBC 9.3 11.5* 11.7*  PLT 142* 189 202    COAGULATION No results found for this basename: INR,  in the last 168 hours  CARDIAC    Recent Labs Lab 08/16/14 1456 08/16/14 2200 08/17/14 0200 08/20/14 0500  TROPONINI >20.00* >20.00* >20.00* 11.82*    Recent Labs Lab 08/20/14 0500 08/22/14 1150  PROBNP 51736.0* 43395.0*     CHEMISTRY  Recent Labs Lab 08/17/14 0400 08/18/14 0329 08/19/14 0330 08/20/14 0500 08/21/14 0344 08/22/14 1150  08/23/14 0523  NA 134* 132* 136* 137 139 140 136*  K 3.3* 3.3* 3.6* 3.3* 3.5* 4.2 3.9  CL 99 96 99 102 102 102 99  CO2 22 22 23 24 25 25 26   GLUCOSE 128* 159* 161* 158* 116* 102* 154*  BUN 20 32* 31* 26* 22 24* 29*  CREATININE 2.12* 2.47* 2.22* 1.86* 1.78* 1.79* 1.75*  CALCIUM 8.0* 8.2* 8.5 8.6 8.5 8.4 8.5  MG 2.2 2.2 2.6*  --   --   --  2.3  PHOS 1.5* 2.6 4.9*  --   --   --  2.6   Estimated Creatinine Clearance: 39.8 ml/min (by C-G formula based on Cr of 1.75).   LIVER  Recent Labs Lab 08/22/14 1150  AST 37  ALT 27  ALKPHOS 112  BILITOT 0.4  PROT 6.2  ALBUMIN 2.4*     INFECTIOUS  Recent Labs Lab 08/16/14 1500  LATICACIDVEN 3.0*     ENDOCRINE CBG (last 3)   Recent Labs  08/22/14 2034 08/23/14 0024 08/23/14 0407  GLUCAP 171* 196* 133*         IMAGING x48h Dg Chest Port 1 View  08/22/2014   CLINICAL DATA:  Respiratory distress.  EXAM: PORTABLE CHEST - 1 VIEW  COMPARISON:  08/21/2014.  FINDINGS: Left IJ line in stable anatomic position. Mediastinum and hilar structures are stable. Cardiomegaly with pulmonary vascular prominence and diffuse bilateral pulmonary infiltrates noted consistent with congestive heart failure and pulmonary edema. Pulmonary edema has progressed from prior exam. Tiny pleural effusions cannot be excluded. There is no pneumothorax. No acute bony abnormality.  IMPRESSION: 1. Congestive heart failure with bilateral pulmonary edema. Pulmonary has progressed from prior exam. Small pleural effusions cannot be excluded. 2. Left IJ line in stable position.   Electronically Signed   By: Marcello Moores  Register   On: 08/22/2014 10:05        ASSESSMENT / PLAN:  PULMONARY ETT 8/27 >> 8/30, 8/31 >> 9/01 A:  Acute Respiratory Failure Recurrent Acute Pulmonary Edema  Pleural effusions COPD, O2 dependent PTA Mild wheezing   - He is now s/p extubation since 08/20/14 and holding ground but he has significant pulm edema and wheezing despite optimal  cardiac Rx (see below) and COPD Rx  P:   SuppO2 to maintain SpO2 > 93% Cont nebulized steroids and BDs  Continue  IV Steroids  Since 08/22/14   CARDIOVASCULAR 8/27 Left IJ CVL 8/27 >>  A:  Acute MI  Inoperable coronary disease Cardiogenic shock Severe ischemic cardiomyopathy   - flash pulm edema 08/22/14 and ongoing acute systolic CHF P:  Mgmt per Cards - plavix, milrinone, lipitor, amio and lasix; plans to wean off milrinone    RENAL A:   AKI, nonoliguric    - Cr improving  P:   Monitor BMET intermittently Maintain neg balance Repeat lasix per cards  GASTROINTESTINAL A:   Chronic intermittent abd pain, concern for mesenteric ischemia Hematochezia, resolved   - no acute issues  P:   SUP:  enteral pantoprazole diet Consider CTA abdomen/pelvis when clinically stable per GI recs  HEMATOLOGIC A:   Acute blood loss anemia, no active bleeding currently   -  Now with anemia of critical illness  P:  DVT px: SCDs (due to recent bleeding) Monitor CBC intermittently Transfuse per usual ICU guidelines; goal HGB is > 8gm% in setting of MI - will repeat 08/22/14  INFECTIOUS A:   No overt infectious process identified P:   Blood 8/28 >> NEG Sputum 8/28 >> NOF  Vanc 8/28 >> 8/31 Zosyn 8/28 >> 8/31  ENDOCRINE A:   Stress assoc hyperglycemia without prior dx of DM P:   Mod scale SSI  NEUROLOGIC A:   Denies pain or anxiety  P:   Low dose PRN fentanyl  GLOBAL 08/22/14: DNR per daughter noted but also notes indicate that need to have conversation with patient when he is more stable cognitively.HE is cognitively ok today but in resp distress so cannot have this conversation.  Will increase nebs, start steroids and get cards involved. PRimary team is [facility] and to consider Palliative care for goals of care  08/23/14 patient updated. Per Dr Eliseo Squires primary - he is DNR but refused hospice and wants full medical care. Refused SNF   PCCM wil follow next 1-3 days and decide on  sign off      Dr. Brand Males, M.D., [facility].C.P Pulmonary and Critical Care Medicine Staff Physician [facility] Pager: [phone], If no answer or between  15:00h - 7:00h: call [phone]  08/23/2014 9:52 AM

## 2014-08-23 NOTE — Progress Notes (Signed)
08/23/2014- Resp care note- Pt placed on bipap 12/6 with 100% fio2 to decrease work of breathing.  Pt tolerated bipap for approx 5 minutes then began to pull at mask and request to be placed back on non-rebreather.  Pt removed from bipap and placed on non-rebreather as requested.  No change notedin breathing with pt placed on bipap vs non-rebreather.  Will cont to follow pt progress.  Bipap on stand-by at bedside.  s Lester Platas rrt, rcp

## 2014-08-23 NOTE — Progress Notes (Signed)
Pt called out to say he couldn't catch his breath.  Pt was 95% on 4L but felt very shob.  Pt has expiratory wheezes, which seem to be improved from yesterday.  Called respiratory and treatment was administered.  Notified MD.

## 2014-08-23 NOTE — Progress Notes (Addendum)
Patient c/o increasing sOB- check x ray, try ativan as patient appears anxious- also add morphine PRN for SOB; will  transfer to SDU and bipap- patient is a DNR but ok with bipap  Douglas Mann

## 2014-08-23 NOTE — Progress Notes (Signed)
PROGRESS NOTE  Douglas Mann ZYY:[id number] DOB: [birth year] DOA: 08/13/2014 PCP: [name], MD  68 year old male with 62 pack active smoker, full code with known CAD and s/p stent at [facility] > 10 years ago and copd with o2 dependency Admitted 08/13/14  with  a chief complaint of bleeding in his bowels and a swollen abdomen along with a story that sounded like mesentric angina (post prandial abd discmfrt and weight Loss and ability to handle only small amounts of food at at time). At admission he ruled out for MI. Then on evening of 08/15/14 he develped sudden onset chest pain, with sinus tachycardia with EKG change to duffise ST depression in inferior and lateral leads and respiratory distress with CXR suggestive of flash pulmonary edema. Despite lasix he developed 2 x episodes of V Tach with normal BP and needing IV amio (never needed cpr). Patient emergently intubated on [facility] floor and moved to ICU at [facility].   8/30 he was extubated  Then on 8/31 early AM: re-intubated. Edema pattern on CXR. Milrinone initiated by Cardiology  9/01 Extubated. Remained slightly encephalopathic due to residual sedating medications. Discussed re-intubation status with pt (who did not seem to fully grasp the conversation) and his daughter who feels certain that he would not wish to undergo repeat intubation or ACLS in this situation where there are very limited treatment options and where recurrent respiratory fialure would be indicative of failure of those few options  Assessment/Plan: Acute on chronic systolic heart failure-  -cards consult -milrinone per cards -lasix per cards  Severe oxygen requiring COPD - wears O2 at home and still smokes -add steroids -nebs -chest x ray with pulm edema 9/3  Status post salvage LAD stent in the setting of cardiogenic shock and pulmonary edema   Ventricular tachycardia, suppressed with IV amiodarone  AKI -lasix Cr improving  Chronic intermittent abd pain, concern for  mesenteric ischemia  Hematochezia, resolved Consider CTA abdomen/pelvis when clinically stable per GI recs No pain after eating  ABLA, now anemia of CD -transfuse for < 8  Hyperglycemia SSI  Patient refusing SNF- ? Candidate for LTAC   Code Status: DNR Family Communication: daughter does not want to be contacted unless emergency, spoke with neighbor Al Disposition Plan:    Consultants:  PCCM  Cards  pallaitive care  Procedures:  cath  Antibiotics:    HPI/Subjective: C/o sinus pain  Objective: Filed Vitals:   08/23/14 0411  BP: 102/63  Pulse: 114  Temp: 98.6 F (37 C)  Resp: 20    Intake/Output Summary (Last 24 hours) at 08/23/14 0914 Last data filed at 08/23/14 0500  Gross per 24 hour  Intake    243 ml  Output   1100 ml  Net   -857 ml   Filed Weights   08/19/14 0358 08/20/14 0439 08/21/14 0800  Weight: 79 kg (174 lb 2.6 oz) 80.9 kg (178 lb 5.6 oz) 80 kg (176 lb 5.9 oz)    Exam:   General:  Canones, mild increase in work of breathing  Cardiovascular: rrr  Respiratory: wheezing b/l  Abdomen: +BS, soft  Musculoskeletal: moves all 4 ext   Data Reviewed: Basic Metabolic Panel:  Recent Labs Lab 08/17/14 0400 08/18/14 0329 08/19/14 0330 08/20/14 0500 08/21/14 0344 08/22/14 1150 08/23/14 0523  NA 134* 132* 136* 137 139 140 136*  K 3.3* 3.3* 3.6* 3.3* 3.5* 4.2 3.9  CL 99 96 99 102 102 102 99  CO2 22 22 23 24 25 25  26  GLUCOSE 128* 159* 161* 158* 116* 102* 154*  BUN 20 32* 31* 26* 22 24* 29*  CREATININE 2.12* 2.47* 2.22* 1.86* 1.78* 1.79* 1.75*  CALCIUM 8.0* 8.2* 8.5 8.6 8.5 8.4 8.5  MG 2.2 2.2 2.6*  --   --   --  2.3  PHOS 1.5* 2.6 4.9*  --   --   --  2.6   Liver Function Tests:  Recent Labs Lab 08/22/14 1150  AST 37  ALT 27  ALKPHOS 112  BILITOT 0.4  PROT 6.2  ALBUMIN 2.4*   No results found for this basename: LIPASE, AMYLASE,  in the last 168 hours No results found for this basename: AMMONIA,  in the last 168  hours CBC:  Recent Labs Lab 08/17/14 0400 08/18/14 0329 08/19/14 0330 08/20/14 0500 08/21/14 0344 08/22/14 1150 08/23/14 0523  WBC 14.8* 13.3* 18.1* 13.0* 9.3 11.5* 11.7*  NEUTROABS 11.8* 11.1* 16.4*  --   --   --  10.5*  HGB 9.4* 8.7* 9.5* 8.5* 8.3* 8.2* 7.9*  HCT 27.2* 24.5* 28.2* 25.1* 24.5* 24.9* 23.7*  MCV 85.5 86.6 87.0 86.6 88.4 88.0 88.4  PLT 170 134* 181 158 142* 189 202   Cardiac Enzymes:  Recent Labs Lab 08/16/14 1456 08/16/14 2200 08/17/14 0200 08/20/14 0500  TROPONINI >20.00* >20.00* >20.00* 11.82*   BNP (last 3 results)  Recent Labs  08/15/14 1956 08/20/14 0500 08/22/14 1150  PROBNP 2319.0* 51736.0* 43395.0*   CBG:  Recent Labs Lab 08/22/14 1116 08/22/14 1622 08/22/14 2034 08/23/14 0024 08/23/14 0407  GLUCAP 95 161* 171* 196* 133*    Recent Results (from the past 240 hour(s))  MRSA PCR SCREENING     Status: None   Collection Time    08/16/14  1:59 AM      Result Value Ref Range Status   MRSA by PCR NEGATIVE  NEGATIVE Final   Comment:            The GeneXpert MRSA Assay (FDA     approved for NASAL specimens     only), is one component of a     comprehensive MRSA colonization     surveillance program. It is not     intended to diagnose MRSA     infection nor to guide or     monitor treatment for     MRSA infections.  CULTURE, RESPIRATORY (NON-EXPECTORATED)     Status: None   Collection Time    08/16/14 11:48 AM      Result Value Ref Range Status   Specimen Description TRACHEAL ASPIRATE   Final   Special Requests NONE   Final   Gram Stain     Final   Value: FEW WBC PRESENT,BOTH PMN AND MONONUCLEAR     RARE SQUAMOUS EPITHELIAL CELLS PRESENT     FEW GRAM POSITIVE COCCI     IN PAIRS IN CLUSTERS RARE GRAM NEGATIVE RODS     Performed at [facility]   Culture     Final   Value: Non-Pathogenic Oropharyngeal-type Flora Isolated.     Performed at [facility]   Report Status 08/18/2014 FINAL   Final  CULTURE, BLOOD  (ROUTINE X 2)     Status: None   Collection Time    08/16/14 12:25 PM      Result Value Ref Range Status   Specimen Description BLOOD RIGHT HAND   Final   Special Requests BOTTLES DRAWN AEROBIC AND ANAEROBIC 5CC   Final   Culture  Setup  Time     Final   Value: 08/16/2014 17:16     Performed at [facility]   Culture     Final   Value: NO GROWTH 5 DAYS     Performed at [facility]   Report Status 08/22/2014 FINAL   Final  CULTURE, BLOOD (ROUTINE X 2)     Status: None   Collection Time    08/16/14 12:40 PM      Result Value Ref Range Status   Specimen Description BLOOD LEFT HAND   Final   Special Requests BOTTLES DRAWN AEROBIC AND ANAEROBIC 5CC   Final   Culture  Setup Time     Final   Value: 08/16/2014 17:16     Performed at [facility]   Culture     Final   Value: NO GROWTH 5 DAYS     Performed at [facility]   Report Status 08/22/2014 FINAL   Final  CLOSTRIDIUM DIFFICILE BY PCR     Status: None   Collection Time    08/20/14  5:06 PM      Result Value Ref Range Status   C difficile by pcr NEGATIVE  NEGATIVE Final     Studies: Dg Chest Port 1 View  08/22/2014   CLINICAL DATA:  Respiratory distress.  EXAM: PORTABLE CHEST - 1 VIEW  COMPARISON:  08/21/2014.  FINDINGS: Left IJ line in stable anatomic position. Mediastinum and hilar structures are stable. Cardiomegaly with pulmonary vascular prominence and diffuse bilateral pulmonary infiltrates noted consistent with congestive heart failure and pulmonary edema. Pulmonary edema has progressed from prior exam. Tiny pleural effusions cannot be excluded. There is no pneumothorax. No acute bony abnormality.  IMPRESSION: 1. Congestive heart failure with bilateral pulmonary edema. Pulmonary has progressed from prior exam. Small pleural effusions cannot be excluded. 2. Left IJ line in stable position.   Electronically Signed   By: Marcello Moores  Register   On: 08/22/2014 10:05    Scheduled Meds: . albuterol  2.5  mg Nebulization Once  . amiodarone  200 mg Oral Daily  . antiseptic oral rinse  7 mL Mouth Rinse BID  . aspirin  81 mg Oral Daily  . atorvastatin  80 mg Oral q1800  . budesonide (PULMICORT) nebulizer solution  0.5 mg Nebulization BID  . captopril  6.25 mg Oral TID  . clopidogrel  75 mg Oral Daily  . feeding supplement (RESOURCE BREEZE)  1 Container Oral TID BM  . fluticasone  2 spray Each Nare Daily  . [START ON 08/24/2014] furosemide  40 mg Intravenous Daily  . guaiFENesin  600 mg Oral BID  . hydrocortisone   Rectal BID  . insulin aspart  0-15 Units Subcutaneous 6 times per day  . ipratropium-albuterol  3 mL Nebulization Q4H  . methylPREDNISolone (SOLU-MEDROL) injection  60 mg Intravenous 3 times per day  . sodium chloride  3 mL Intravenous Q12H  . sodium chloride  3 mL Intravenous Q12H  . triamcinolone cream   Topical BID   Continuous Infusions: . sodium chloride 7 mL/hr at 08/21/14 1200  . 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 EF 5 -10% by LV gram - following non-STEMI and ventricular tachycardia   AKI (acute kidney injury)    Time spent: 35 min    Yosef Krogh  [facility] Pager [phone]. If 7PM-7AM, please contact  night-coverage at [email or web address], password [facility] 08/23/2014, 9:14 AM  LOS: 10 days

## 2014-08-24 DIAGNOSIS — I472 Ventricular tachycardia: Secondary | ICD-10-CM

## 2014-08-24 DIAGNOSIS — I4729 Other ventricular tachycardia: Secondary | ICD-10-CM

## 2014-08-24 LAB — CBC WITH DIFFERENTIAL/PLATELET
BASOS ABS: 0 10*3/uL (ref 0.0–0.1)
Basophils Relative: 0 % (ref 0–1)
EOS ABS: 0 10*3/uL (ref 0.0–0.7)
Eosinophils Relative: 0 % (ref 0–5)
HCT: 23.4 % — ABNORMAL LOW (ref 39.0–52.0)
Hemoglobin: 7.7 g/dL — ABNORMAL LOW (ref 13.0–17.0)
LYMPHS PCT: 5 % — AB (ref 12–46)
Lymphs Abs: 0.5 10*3/uL — ABNORMAL LOW (ref 0.7–4.0)
MCH: 29.7 pg (ref 26.0–34.0)
MCHC: 32.9 g/dL (ref 30.0–36.0)
MCV: 90.3 fL (ref 78.0–100.0)
MONO ABS: 0.6 10*3/uL (ref 0.1–1.0)
Monocytes Relative: 6 % (ref 3–12)
Neutro Abs: 10.4 10*3/uL — ABNORMAL HIGH (ref 1.7–7.7)
Neutrophils Relative %: 89 % — ABNORMAL HIGH (ref 43–77)
Platelets: 225 10*3/uL (ref 150–400)
RBC: 2.59 MIL/uL — ABNORMAL LOW (ref 4.22–5.81)
RDW: 14.9 % (ref 11.5–15.5)
WBC: 11.5 10*3/uL — AB (ref 4.0–10.5)

## 2014-08-24 LAB — BASIC METABOLIC PANEL
ANION GAP: 15 (ref 5–15)
Anion gap: 11 (ref 5–15)
BUN: 41 mg/dL — ABNORMAL HIGH (ref 6–23)
BUN: 49 mg/dL — ABNORMAL HIGH (ref 6–23)
CALCIUM: 8.2 mg/dL — AB (ref 8.4–10.5)
CALCIUM: 8.2 mg/dL — AB (ref 8.4–10.5)
CO2: 27 mEq/L (ref 19–32)
CO2: 24 mEq/L (ref 19–32)
CREATININE: 2.01 mg/dL — AB (ref 0.50–1.35)
Chloride: 101 mEq/L (ref 96–112)
Chloride: 99 mEq/L (ref 96–112)
Creatinine, Ser: 1.95 mg/dL — ABNORMAL HIGH (ref 0.50–1.35)
GFR calc Af Amer: 37 mL/min — ABNORMAL LOW (ref >90)
GFR calc Af Amer: 38 mL/min — ABNORMAL LOW (ref >90)
GFR, EST NON AFRICAN AMERICAN: 32 mL/min — AB (ref >90)
GFR, EST NON AFRICAN AMERICAN: 33 mL/min — AB (ref >90)
GLUCOSE: 150 mg/dL — AB (ref 70–99)
GLUCOSE: 184 mg/dL — AB (ref 70–99)
POTASSIUM: 3.5 meq/L — AB (ref 3.7–5.3)
Potassium: 4 mEq/L (ref 3.7–5.3)
SODIUM: 138 meq/L (ref 137–147)
Sodium: 139 mEq/L (ref 137–147)

## 2014-08-24 LAB — GLUCOSE, CAPILLARY
GLUCOSE-CAPILLARY: 149 mg/dL — AB (ref 70–99)
GLUCOSE-CAPILLARY: 166 mg/dL — AB (ref 70–99)
GLUCOSE-CAPILLARY: 205 mg/dL — AB (ref 70–99)
Glucose-Capillary: 148 mg/dL — ABNORMAL HIGH (ref 70–99)
Glucose-Capillary: 163 mg/dL — ABNORMAL HIGH (ref 70–99)
Glucose-Capillary: 147 mg/dL — ABNORMAL HIGH (ref 70–99)
Glucose-Capillary: 264 mg/dL — ABNORMAL HIGH (ref 70–99)
Glucose-Capillary: 160 mg/dL — ABNORMAL HIGH (ref 70–99)

## 2014-08-24 LAB — OCCULT BLOOD X 1 CARD TO LAB, STOOL: Fecal Occult Bld: POSITIVE — AB

## 2014-08-24 LAB — MAGNESIUM
MAGNESIUM: 2.3 mg/dL (ref 1.5–2.5)
Magnesium: 2.4 mg/dL (ref 1.5–2.5)

## 2014-08-24 LAB — PHOSPHORUS: Phosphorus: 4.3 mg/dL (ref 2.3–4.6)

## 2014-08-24 MED ORDER — AMIODARONE HCL 200 MG PO TABS
200.0000 mg | ORAL_TABLET | Freq: Two times a day (BID) | ORAL | Status: DC
  Administered 2014-08-24 – 2014-08-26 (×4): 200 mg via ORAL
  Filled 2014-08-24 (×5): qty 1

## 2014-08-24 MED ORDER — POTASSIUM CHLORIDE 20 MEQ/15ML (10%) PO LIQD
40.0000 meq | Freq: Once | ORAL | Status: AC
  Administered 2014-08-24: 40 meq via ORAL
  Filled 2014-08-24: qty 30

## 2014-08-24 MED ORDER — FUROSEMIDE 10 MG/ML IJ SOLN
80.0000 mg | Freq: Every day | INTRAMUSCULAR | Status: DC
  Administered 2014-08-25 – 2014-08-28 (×4): 80 mg via INTRAVENOUS
  Filled 2014-08-24 (×4): qty 8

## 2014-08-24 NOTE — Progress Notes (Signed)
Pt went into sustained VT at 2100. Pt asymptomatic. [facility] MD aware and order amio bolus. Post bolus HR remains in the 150's. MD aware of no HR change. Pt is DNR and currently resting comfortably. Family at bedside. Will continue to monitor pt.

## 2014-08-24 NOTE — Progress Notes (Addendum)
       Patient Name: Douglas Mann Date of Encounter: 08/24/2014    SUBJECTIVE: VT overnight, now back in sinus,  SOB is stable, denies CP  TELEMETRY:  VT overnight (sustained), now in Sinus rhythm with frequent PVCs  Filed Vitals:   08/24/14 0900 08/24/14 1000 08/24/14 1100 08/24/14 1215  BP: 100/66 77/47 81/46    Pulse: 110 105 106   Temp:      TempSrc:      Resp: 20 14 18    Height:      Weight:      SpO2: 99% 100% 100% 100%    Intake/Output Summary (Last 24 hours) at 08/24/14 1234 Last data filed at 08/24/14 1100  Gross per 24 hour  Intake   1050 ml  Output    575 ml  Net    475 ml   LABS: Basic Metabolic Panel:  Recent Labs  08/23/14 0523 08/23/14 1840 08/24/14 0455  NA 136* 137 139  K 3.9 4.0 4.0  CL 99 98 101  CO2 26 26 27   GLUCOSE 154* 148* 150*  BUN 29* 38* 41*  CREATININE 1.75* 2.17* 2.01*  CALCIUM 8.5 8.2* 8.2*  MG 2.3 2.5 2.4  PHOS 2.6  --  4.3   CBC:  Recent Labs  08/23/14 0523 08/24/14 0455  WBC 11.7* 11.5*  NEUTROABS 10.5* 10.4*  HGB 7.9* 7.7*  HCT 23.7* 23.4*  MCV 88.4 90.3  PLT 202 225     Physical Exam: Blood pressure 81/46, pulse 106, temperature 98.1 F (36.7 C), temperature source Oral, resp. rate 18, height 5\' 7"  (1.702 m), weight 165 lb 5.5 oz (75 kg), SpO2 100.00%. Weight change:   Wt Readings from Last 3 Encounters:  08/23/14 165 lb 5.5 oz (75 kg)  08/23/14 165 lb 5.5 oz (75 kg)  08/23/14 165 lb 5.5 oz (75 kg)  chronically ill appearing, alert and pleasant, comfortable with face mask HEENT OP clear Neck supple, L CVL in place CV RRR with ectopy Diffuse expiratory wheezes with a prolonged expiratory phase Abd: soft Neuro exam strength and sensation are intact Ext no edema  ASSESSMENT:  1. Acute on chronic systolic heart failure/ICM- Will continue milrinone for 1 more day and then discontinue tomorrow Increase IV lasix to 80mg  daily today Strict Is and Os Follow renal function.  Add low-dose hydralazine later if  BP allows. Will try low dose captopril to see if he tolerates (BP and renal function); if so, transition to lisinopril later. No beta blocker given COPD.  2. Severe oxygen requiring COPD- This appears to be his primary issue today. Continue pulmonary toilet per pulmonary.  Prognosis is poor  3. CAD-Status post salvage LAD stent in the setting of cardiogenic shock and pulmonary edema, with a nice angiographic result and now gradual improvement. Continue aspirin, Plavix and statin.  4. Ventricular tachycardia- likely exacerbated by milrinone Increase amiodarone to 200mg  BID  5 chronic kidney disease-watch renal function closely with diuresis and addition of ACEI  Patient's prognosis is very poor.   He is clear that he wishes for DNI/DNR.  He is not ready for full palliative measures  The patient is critically ill with multiple organ systems failure and requires high complexity decision making for assessment and support, frequent evaluation and titration of therapies, application of advanced monitoring technologies and extensive interpretation of multiple databases.   Total CCT spent directly with the patient today is 30 minutes     Signed, Thompson Grayer 08/24/2014, 12:34 PM

## 2014-08-24 NOTE — Progress Notes (Signed)
Patient SW:FUXNATF Grade      DOB: 1946-10-23      TDD:[id number]   Palliative Medicine Team at [facility] Progress Note    Subjective: Looks better this morning. Not complaining of pain or dyspnea. Conversational dyspnea improved but still on NRB.  Feels hungry/thirsty this morning.     Filed Vitals:   08/24/14 0514  BP: 104/65  Pulse: 97  Temp:   Resp: 16   Physical exam: GEN: Alert, NAD HEENT: [place], sclera anicteric  CV: regular rate ABD: soft, NT, ND  EXT: no cyanosis    Assessment and plan: 68 yo male with CAD/ICM, COPD who presented with abdominal pain (cocnern for mesenteric ischemia) who developed acute respiratoyr distress and NSTEMI requiring intubation. Hospital course complicated by ongoing resp failure with COPD and heart failure contributions, AKI. Palliative Care consulted for goals of care.   1. Code Status: DNR   2. Goals of Care:  See initial consultation. Respiratory status improved this morning but remains tenuous.  I don't think he has great insight into how serious this all is (or at least is hard for him to talk about).  He talks about wanting to go home, and we described some of the barriers to this (O2 requirements, milrinone drip, etc) and he was surprised to hear me say that if we sent him home now, his time would be very short.  He has positive attitude about things which may be because he is again feeling so much better this morning. Discussed concerns that he could again have respiratory decline like yesterday and that his heart/lungs are in fragile state.  He is willing to continue treatments here in hospital and even will try BiPAP again, but he does not seem to be able to tolerate very long.  We will continue to follow along and support him as well as follow his clinical trajectory which may delineate goals more clearly.  3. Symptom Management:  Dyspnea- continue PRN dilaudid (2 doses yesterday).  Medical management         If unable to tolerate  BiPAP or further decline and family wishes to pursue comfort, would recommend plan for comfort care as follows:  - Would d/c all current meds including milrinone.  - would transition to [place] at 6L for comfort and not titrate to O2 sat  - Would start dilaudid infusion at 0.5-1.0mg /hr with 1mg  IV bolus q18min PRN  - ativan PRN dyspnea  - Atropine gtt's Prn secretions  - If transition to comfort care, suspect time will be short and likely in hours given degree of resp decline.   4. Psychosocial/Spiritual: Lives at home with [facility]. Divorced. First wife is deceased (dghtr Vera's mom). Divorced from 2nd wife and has daughter in [place] from that marriage. Close with his friend Al who lives next door to him. Has a sister Violet who has significant health problems as well as a brother who he describes as "dying from lung cancer". 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 Enjoys watching westerns.   Time In: 740 Time Out: 805 Total Time: 25 minutes  >50% of time spent in counseling and coordination of care regarding above plan  Doran Clay D.O. Palliative Medicine Team at [facility]  Pager: [phone] Team Phone: [phone]

## 2014-08-24 NOTE — Progress Notes (Signed)
CCMD called re pt's rhythm, Looked at monitor, Pt's HR 130's. Checked on pt., pt. asleep. Woke pt up, stated he felt ok. Pt had a long run of VT and asymptomatic. On call MD (Dr. Alejandro Mulling) paged. Awaiting response. Pt eating dinner at present. Will monitor.

## 2014-08-24 NOTE — Progress Notes (Signed)
Pt converted to NSR at 0145 on 08/23/14

## 2014-08-24 NOTE — Progress Notes (Signed)
PULMONARY / CRITICAL CARE MEDICINE   Name: Douglas Mann MRN: [id number] DOB: Dec 12, 1946    ADMISSION DATE:  08/13/2014 CONSULTATION DATE:  08/15/2014  REFERRING MD :   [facility]  INITIAL PRESENTATION:   68 y/o male admitted on 8/25 for BRBPR and abdominal pain who developed chest pain, NSTEMI, VT on 8/27.  He was intubated and went to the cath lab where he was found to have a markedly elevated LVEDP.  EVENTS  8/25 Admitted with complaints of abd pain, BRBPR 8/25 CT abs/pelvis: no acute findings 8/27 Developed CP with c/o dysphagia 8/27 Echocardiogram: The cavity size was normal. Wall thickness was increased in a pattern of mild LVH. Systolic function was normal. The estimated ejection fraction was in the range of 55% to 60%. Wall motion was normal; there were no regional wall motion abnormalities. The LA was mildly dilated 8/27 Cards consult 8/27 developed resp distress, diaphoresis, CP, pulm edema. Positive cardiac markers 8/27 PCCM consult and intubation 8/28 early AM: urgent cath: Severe 2 vessel CAD with 100% occluded RCA, LAD 90% stenosed, severe ischemic cardiomyopathy LVEF 5-10% 8/28 AM IABP placed 8/28 Echocardiogram: LVEF 30-35% 8/28 GI consult: Concern for possible chronic mesenteric ischemia. Consider CTA abdomen/pelvis when clinically stable 8/30 IABP removed and extubated 8/31 early AM: re-intubated. Edema pattern on CXR. Milrinone initiated by Cardiology 9/01 Extubated. Remained slightly encephalopathic due to residual sedating medications. Discussed re-intubation status with pt (who did not seem to fully grasp the conversation) and his daughter who feels certain that he would not wish to undergo repeat intubation or ACLS in this situation where there are very limited treatment options and where recurrent respiratory fialure would be indicative of failure of those few options. EXTUBATED 08/20/14  08/22/14: Now on floor bed.  Significant respiratory distress overnight with wheezing and  crack;es and Rx with morphine, nebs and o2. This am still c/o dyspnea but much better. Has NRB on.CXR witth worsening pulm edema and cards restarted lasix  SUBJECTIVE/OVERNIGHT/INTERVAL HX 08/24/14 >improved with increased alertness, decreased WOB/dyspnea  Unable to tolerate BIPAP for long periods of time Palliative consult -DNR , if cont to decline will persue comfort care plan  Awake and watching TV this am, wants more food.   VITAL SIGNS: Temp:  [97.6 F (36.4 C)-98.5 F (36.9 C)] 98.1 F (36.7 C) (09/05 0800) Pulse Rate:  [93-161] 97 (09/05 0514) Resp:  [11-29] 19 (09/05 0800) BP: (72-133)/(53-76) 107/63 mmHg (09/05 0800) SpO2:  [90 %-100 %] 100 % (09/05 0846) FiO2 (%):  [55 %-100 %] 55 % (09/05 0846) Weight:  [75 kg (165 lb 5.5 oz)] 75 kg (165 lb 5.5 oz) (09/04 1200)  HEMODYNAMICS:   VENTILATOR SETTINGS: Vent Mode:  [-]  FiO2 (%):  [55 %-100 %] 55 %  INTAKE / OUTPUT:  Intake/Output Summary (Last 24 hours) at 08/24/14 0916 Last data filed at 08/24/14 0800  Gross per 24 hour  Intake   1480 ml  Output   1125 ml  Net    355 ml   PHYSICAL EXAMINATION: General:  RASS 0, follows commands, alert  HEENT: WNL. Nasal cannula o2 on PULM: NRB on . No distress but wheezing + faint exp  expiration prolonged CV: tachy 97, no M AB: BS+, soft Ext: cool, no edema Neuro: No focal deficits  LABS: PULMONARY  Recent Labs Lab 08/17/14 1030 08/18/14 0324  PHART  --  7.422  PCO2ART  --  36.0  PO2ART  --  83.2  HCO3  --  23.0  TCO2  --  24.2  O2SAT 90.2 96.1   CBC  Recent Labs Lab 08/22/14 1150 08/23/14 0523 08/24/14 0455  HGB 8.2* 7.9* 7.7*  HCT 24.9* 23.7* 23.4*  WBC 11.5* 11.7* 11.5*  PLT 189 202 225   COAGULATION No results found for this basename: INR,  in the last 168 hours  CARDIAC   Recent Labs Lab 08/20/14 0500  TROPONINI 11.82*    Recent Labs Lab 08/20/14 0500 08/22/14 1150  PROBNP 51736.0* 43395.0*   CHEMISTRY  Recent Labs Lab  08/18/14 0329 08/19/14 0330  08/21/14 0344 08/22/14 1150 08/23/14 0523 08/23/14 1840 08/24/14 0455  NA 132* 136*  < > 139 140 136* 137 139  K 3.3* 3.6*  < > 3.5* 4.2 3.9 4.0 4.0  CL 96 99  < > 102 102 99 98 101  CO2 22 23  < > 25 25 26 26 27   GLUCOSE 159* 161*  < > 116* 102* 154* 148* 150*  BUN 32* 31*  < > 22 24* 29* 38* 41*  CREATININE 2.47* 2.22*  < > 1.78* 1.79* 1.75* 2.17* 2.01*  CALCIUM 8.2* 8.5  < > 8.5 8.4 8.5 8.2* 8.2*  MG 2.2 2.6*  --   --   --  2.3 2.5 2.4  PHOS 2.6 4.9*  --   --   --  2.6  --  4.3  < > = values in this interval not displayed. Estimated Creatinine Clearance: 32 ml/min (by C-G formula based on Cr of 2.01).  LIVER  Recent Labs Lab 08/22/14 1150  AST 37  ALT 27  ALKPHOS 112  BILITOT 0.4  PROT 6.2  ALBUMIN 2.4*   INFECTIOUS No results found for this basename: LATICACIDVEN, PROCALCITON,  in the last 168 hours  ENDOCRINE CBG (last 3)   Recent Labs  08/23/14 0407 08/23/14 1110 08/23/14 1628  GLUCAP 133* 298* 162*   IMAGING x48h Dg Chest Port 1 View  08/23/2014   CLINICAL DATA:  Shortness of Breath  EXAM: PORTABLE CHEST - 1 VIEW  COMPARISON:  08/22/2014  FINDINGS: Cardiomediastinal silhouette is stable. Left IJ central line is unchanged in position. Persistent bilateral mild congestion/edema. Probable bilateral small pleural effusion with bilateral basilar atelectasis or infiltrate.  IMPRESSION: Persistent bilateral mild congestion/edema. Probable bilateral small pleural effusion with bilateral basilar atelectasis or infiltrate.   Electronically Signed   By: Lahoma Crocker M.D.   On: 08/23/2014 11:11   ASSESSMENT / PLAN:  PULMONARY ETT 8/27 >> 8/30, 8/31 >> 9/01 A:  Acute Respiratory Failure Recurrent Acute Pulmonary Edema  Pleural effusions COPD, O2 dependent PTA AECOPD w/ Mild wheezing   - He is now s/p extubation since 08/20/14 and holding ground but he has significant pulm edema and wheezing despite optimal cardiac Rx (see below) and COPD  Rx  P:   SuppO2 to maintain SpO2 > 93% Cont nebulized steroids and BDs  Continue  IV Steroids  Since 08/22/14-consider oral steroid in am if less wheezing  BIPAP As needed    CARDIOVASCULAR 8/27 Left IJ CVL 8/27 >>  A:  Acute MI  Inoperable coronary disease Cardiogenic shock Severe ischemic cardiomyopathy   - flash pulm edema 08/22/14 and ongoing acute systolic CHF P:  Mgmt per Cards - plavix, milrinone, lipitor, amio and lasix; plans to wean off milrinone  RENAL A:   AKI, nonoliguric  9/5 >scr tr up on lasix   P:   Monitor BMET intermittently Maintain neg balance as b/p and scr allow   GASTROINTESTINAL A:  Chronic intermittent abd pain, concern for mesenteric ischemia Hematochezia, resolved  - no acute issues  P:   SUP: enteral pantoprazole diet Consider CTA abdomen/pelvis when clinically stable per GI recs  HEMATOLOGIC A:   Acute blood loss anemia, no active bleeding currently   -  Now with anemia of critical illness  P:  DVT px: SCDs (due to recent bleeding) Monitor CBC intermittently Transfuse per usual ICU guidelines; goal HGB is > 8gm% in setting of MI - will repeat 08/22/14  INFECTIOUS A:   No overt infectious process identified P:   Blood 8/28 >> NEG Sputum 8/28 >> NOF  Vanc 8/28 >> 8/31 Zosyn 8/28 >> 8/31  ENDOCRINE A:   Stress assoc hyperglycemia without prior dx of DM P:   Mod scale SSI  NEUROLOGIC A:   Denies pain or anxiety  P:   Dilaudid  And Ativan As needed    Tammy Parrett NP-C  [facility]  [phone]    08/24/2014 9:16 AM  Very tenuous respiratory status. Seems to have limited insight on his condition.  DNR but full medical care is desired.  BiPPA is acceptable.  Comfort measures if fails BiPAP.  Palliative Care following.  PCCM will sign off.  Please reconsult if necessary.  I have personally obtained history, examined patient, evaluated and interpreted laboratory and imaging results, reviewed  medical records, formulated assessment / plan and placed orders.  Doree Fudge, MD Pulmonary and [place] Pager: [phone]  08/24/2014, 12:55 PM

## 2014-08-24 NOTE — Progress Notes (Signed)
PROGRESS NOTE  Douglas Mann HWE:[id number] DOB: 10/19/1946 DOA: 08/13/2014 PCP: [name], MD  68 year old male with 72 pack active smoker, full code with known CAD and s/p stent at [facility] > 10 years ago and copd with o2 dependency Admitted 08/13/14  with  a chief complaint of bleeding in his bowels and a swollen abdomen along with a story that sounded like mesentric angina (post prandial abd discmfrt and weight Loss and ability to handle only small amounts of food at at time). At admission he ruled out for MI. Then on evening of 08/15/14 he develped sudden onset chest pain, with sinus tachycardia with EKG change to duffise ST depression in inferior and lateral leads and respiratory distress with CXR suggestive of flash pulmonary edema. Despite lasix he developed 2 x episodes of V Tach with normal BP and needing IV amio (never needed cpr). Patient emergently intubated on [place] floor and moved to ICU at [facility].   8/30 he was extubated  Then on 8/31 early AM: re-intubated. Edema pattern on CXR. Milrinone initiated by Cardiology  9/01 Extubated. Remained slightly encephalopathic due to residual sedating medications. Discussed re-intubation status with pt (who did not seem to fully grasp the conversation) and his daughter who feels certain that he would not wish to undergo repeat intubation or ACLS in this situation where there are very limited treatment options and where recurrent respiratory fialure would be indicative of failure of those few options  Assessment/Plan: Acute on chronic systolic heart failure-  -cards consult -milrinone per cards -lasix per cards  Severe oxygen requiring COPD - wears O2 at home and still smokes -add steroids -nebs -chest x ray with pulm edema 9/3  Status post salvage LAD stent in the setting of cardiogenic shock and pulmonary edema   Ventricular tachycardia, suppressed with amiodarone  AKI -lasix Cr improving  Chronic intermittent abd pain, concern for  mesenteric ischemia  Hematochezia, resolved Consider CTA abdomen/pelvis when clinically stable per GI recs No pain after eating  ABLA, now anemia of CD -transfuse for < 7  Hyperglycemia SSI  Patient refusing SNF- PT to follow   Code Status: DNR Family Communication: daughter Janace Hoard and spoke with neighbor Al Disposition Plan:    Consultants:  PCCM  Cards  pallaitive care  Procedures:  cath   HPI/Subjective: Breathing better this AM Had episodes of Vt ac last PM  Objective: Filed Vitals:   08/24/14 1100  BP: 81/46  Pulse: 106  Temp:   Resp: 18    Intake/Output Summary (Last 24 hours) at 08/24/14 1211 Last data filed at 08/24/14 1100  Gross per 24 hour  Intake   1050 ml  Output    575 ml  Net    475 ml   Filed Weights   08/20/14 0439 08/21/14 0800 08/23/14 1200  Weight: 80.9 kg (178 lb 5.6 oz) 80 kg (176 lb 5.9 oz) 75 kg (165 lb 5.5 oz)    Exam:   General:  Pleasant/cooperative, feeling better  Cardiovascular: rrr  Respiratory: wheezing b/l  Abdomen: +BS, soft  Musculoskeletal: moves all 4 ext   Data Reviewed: Basic Metabolic Panel:  Recent Labs Lab 08/18/14 0329 08/19/14 0330  08/21/14 0344 08/22/14 1150 08/23/14 0523 08/23/14 1840 08/24/14 0455  NA 132* 136*  < > 139 140 136* 137 139  K 3.3* 3.6*  < > 3.5* 4.2 3.9 4.0 4.0  CL 96 99  < > 102 102 99 98 101  CO2 22 23  < > 25  25 26 26 27   GLUCOSE 159* 161*  < > 116* 102* 154* 148* 150*  BUN 32* 31*  < > 22 24* 29* 38* 41*  CREATININE 2.47* 2.22*  < > 1.78* 1.79* 1.75* 2.17* 2.01*  CALCIUM 8.2* 8.5  < > 8.5 8.4 8.5 8.2* 8.2*  MG 2.2 2.6*  --   --   --  2.3 2.5 2.4  PHOS 2.6 4.9*  --   --   --  2.6  --  4.3  < > = values in this interval not displayed. Liver Function Tests:  Recent Labs Lab 08/22/14 1150  AST 37  ALT 27  ALKPHOS 112  BILITOT 0.4  PROT 6.2  ALBUMIN 2.4*   No results found for this basename: LIPASE, AMYLASE,  in the last 168 hours No results found for  this basename: AMMONIA,  in the last 168 hours CBC:  Recent Labs Lab 08/18/14 0329 08/19/14 0330 08/20/14 0500 08/21/14 0344 08/22/14 1150 08/23/14 0523 08/24/14 0455  WBC 13.3* 18.1* 13.0* 9.3 11.5* 11.7* 11.5*  NEUTROABS 11.1* 16.4*  --   --   --  10.5* 10.4*  HGB 8.7* 9.5* 8.5* 8.3* 8.2* 7.9* 7.7*  HCT 24.5* 28.2* 25.1* 24.5* 24.9* 23.7* 23.4*  MCV 86.6 87.0 86.6 88.4 88.0 88.4 90.3  PLT 134* 181 158 142* 189 202 225   Cardiac Enzymes:  Recent Labs Lab 08/20/14 0500  TROPONINI 11.82*   BNP (last 3 results)  Recent Labs  08/15/14 1956 08/20/14 0500 08/22/14 1150  PROBNP 2319.0* 51736.0* 43395.0*   CBG:  Recent Labs Lab 08/22/14 2034 08/23/14 0024 08/23/14 0407 08/23/14 1110 08/23/14 1628  GLUCAP 171* 196* 133* 298* 162*    Recent Results (from the past 240 hour(s))  MRSA PCR SCREENING     Status: None   Collection Time    08/16/14  1:59 AM      Result Value Ref Range Status   MRSA by PCR NEGATIVE  NEGATIVE Final   Comment:            The GeneXpert MRSA Assay (FDA     approved for NASAL specimens     only), is one component of a     comprehensive MRSA colonization     surveillance program. It is not     intended to diagnose MRSA     infection nor to guide or     monitor treatment for     MRSA infections.  CULTURE, RESPIRATORY (NON-EXPECTORATED)     Status: None   Collection Time    08/16/14 11:48 AM      Result Value Ref Range Status   Specimen Description TRACHEAL ASPIRATE   Final   Special Requests NONE   Final   Gram Stain     Final   Value: FEW WBC PRESENT,BOTH PMN AND MONONUCLEAR     RARE SQUAMOUS EPITHELIAL CELLS PRESENT     FEW GRAM POSITIVE COCCI     IN PAIRS IN CLUSTERS RARE GRAM NEGATIVE RODS     Performed at [facility]   Culture     Final   Value: Non-Pathogenic Oropharyngeal-type Flora Isolated.     Performed at [facility]   Report Status 08/18/2014 FINAL   Final  CULTURE, BLOOD (ROUTINE X 2)     Status:  None   Collection Time    08/16/14 12:25 PM      Result Value Ref Range Status   Specimen Description BLOOD RIGHT HAND  Final   Special Requests BOTTLES DRAWN AEROBIC AND ANAEROBIC 5CC   Final   Culture  Setup Time     Final   Value: 08/16/2014 17:16     Performed at [facility]   Culture     Final   Value: NO GROWTH 5 DAYS     Performed at [facility]   Report Status 08/22/2014 FINAL   Final  CULTURE, BLOOD (ROUTINE X 2)     Status: None   Collection Time    08/16/14 12:40 PM      Result Value Ref Range Status   Specimen Description BLOOD LEFT HAND   Final   Special Requests BOTTLES DRAWN AEROBIC AND ANAEROBIC 5CC   Final   Culture  Setup Time     Final   Value: 08/16/2014 17:16     Performed at [facility]   Culture     Final   Value: NO GROWTH 5 DAYS     Performed at [facility]   Report Status 08/22/2014 FINAL   Final  CLOSTRIDIUM DIFFICILE BY PCR     Status: None   Collection Time    08/20/14  5:06 PM      Result Value Ref Range Status   C difficile by pcr NEGATIVE  NEGATIVE Final     Studies: Dg Chest Port 1 View  08/23/2014   CLINICAL DATA:  Shortness of Breath  EXAM: PORTABLE CHEST - 1 VIEW  COMPARISON:  08/22/2014  FINDINGS: Cardiomediastinal silhouette is stable. Left IJ central line is unchanged in position. Persistent bilateral mild congestion/edema. Probable bilateral small pleural effusion with bilateral basilar atelectasis or infiltrate.  IMPRESSION: Persistent bilateral mild congestion/edema. Probable bilateral small pleural effusion with bilateral basilar atelectasis or infiltrate.   Electronically Signed   By: Lahoma Crocker M.D.   On: 08/23/2014 11:11    Scheduled Meds: . amiodarone  200 mg Oral Daily  . antiseptic oral rinse  7 mL Mouth Rinse BID  . aspirin  81 mg Oral Daily  . atorvastatin  80 mg Oral q1800  . budesonide (PULMICORT) nebulizer solution  0.5 mg Nebulization BID  . captopril  6.25 mg Oral TID  .  clopidogrel  75 mg Oral Daily  . feeding supplement (RESOURCE BREEZE)  1 Container Oral TID BM  . fluticasone  2 spray Each Nare Daily  . furosemide  40 mg Intravenous Daily  . guaiFENesin  600 mg Oral BID  . hydrocortisone   Rectal BID  . insulin aspart  0-15 Units Subcutaneous 6 times per day  . ipratropium-albuterol  3 mL Nebulization Q4H  . levofloxacin  750 mg Oral Q48H  . methylPREDNISolone (SOLU-MEDROL) injection  60 mg Intravenous 3 times per day  . sodium chloride  3 mL Intravenous Q12H  . sodium chloride  3 mL Intravenous Q12H  . triamcinolone cream   Topical BID   Continuous Infusions: . sodium chloride 7 mL/hr (08/23/14 1255)  . 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 EF 5 -10% by LV gram - following non-STEMI and ventricular tachycardia   AKI (acute kidney injury)    Time spent: 35 min    Hiro Vipond  [facility] Pager [phone]. If 7PM-7AM, please contact night-coverage at [email or web address], password [facility] 08/24/2014, 12:11 PM  LOS: 11 days

## 2014-08-25 LAB — CBC WITH DIFFERENTIAL/PLATELET
BASOS ABS: 0 10*3/uL (ref 0.0–0.1)
Basophils Relative: 0 % (ref 0–1)
Eosinophils Absolute: 0 10*3/uL (ref 0.0–0.7)
Eosinophils Relative: 0 % (ref 0–5)
HEMATOCRIT: 22 % — AB (ref 39.0–52.0)
HEMOGLOBIN: 7.3 g/dL — AB (ref 13.0–17.0)
LYMPHS PCT: 3 % — AB (ref 12–46)
Lymphs Abs: 0.4 10*3/uL — ABNORMAL LOW (ref 0.7–4.0)
MCH: 29.8 pg (ref 26.0–34.0)
MCHC: 33.2 g/dL (ref 30.0–36.0)
MCV: 89.8 fL (ref 78.0–100.0)
MONO ABS: 0.5 10*3/uL (ref 0.1–1.0)
MONOS PCT: 5 % (ref 3–12)
NEUTROS ABS: 10.3 10*3/uL — AB (ref 1.7–7.7)
Neutrophils Relative %: 92 % — ABNORMAL HIGH (ref 43–77)
Platelets: 252 10*3/uL (ref 150–400)
RBC: 2.45 MIL/uL — ABNORMAL LOW (ref 4.22–5.81)
RDW: 14.8 % (ref 11.5–15.5)
WBC: 11.2 10*3/uL — AB (ref 4.0–10.5)

## 2014-08-25 LAB — BASIC METABOLIC PANEL
Anion gap: 11 (ref 5–15)
BUN: 52 mg/dL — ABNORMAL HIGH (ref 6–23)
CO2: 26 meq/L (ref 19–32)
Calcium: 8.2 mg/dL — ABNORMAL LOW (ref 8.4–10.5)
Chloride: 101 mEq/L (ref 96–112)
Creatinine, Ser: 1.89 mg/dL — ABNORMAL HIGH (ref 0.50–1.35)
GFR calc non Af Amer: 34 mL/min — ABNORMAL LOW (ref >90)
GFR, EST AFRICAN AMERICAN: 40 mL/min — AB (ref >90)
Glucose, Bld: 186 mg/dL — ABNORMAL HIGH (ref 70–99)
Potassium: 4 mEq/L (ref 3.7–5.3)
SODIUM: 138 meq/L (ref 137–147)

## 2014-08-25 LAB — PHOSPHORUS: PHOSPHORUS: 3 mg/dL (ref 2.3–4.6)

## 2014-08-25 LAB — GLUCOSE, CAPILLARY
GLUCOSE-CAPILLARY: 174 mg/dL — AB (ref 70–99)
GLUCOSE-CAPILLARY: 179 mg/dL — AB (ref 70–99)
GLUCOSE-CAPILLARY: 180 mg/dL — AB (ref 70–99)
Glucose-Capillary: 180 mg/dL — ABNORMAL HIGH (ref 70–99)
Glucose-Capillary: 175 mg/dL — ABNORMAL HIGH (ref 70–99)

## 2014-08-25 LAB — MAGNESIUM
Magnesium: 2.4 mg/dL (ref 1.5–2.5)
Magnesium: 2.3 mg/dL (ref 1.5–2.5)

## 2014-08-25 MED ORDER — PREDNISONE 50 MG PO TABS
60.0000 mg | ORAL_TABLET | Freq: Two times a day (BID) | ORAL | Status: DC
  Administered 2014-08-25 – 2014-08-27 (×5): 60 mg via ORAL
  Filled 2014-08-25 (×7): qty 1

## 2014-08-25 MED ORDER — CAPTOPRIL 6.25 MG HALF TABLET
6.2500 mg | ORAL_TABLET | Freq: Three times a day (TID) | ORAL | Status: DC
  Administered 2014-08-25 – 2014-08-28 (×8): 6.25 mg via ORAL
  Filled 2014-08-25 (×11): qty 1

## 2014-08-25 MED ORDER — HYDRALAZINE HCL 10 MG PO TABS
10.0000 mg | ORAL_TABLET | Freq: Three times a day (TID) | ORAL | Status: DC
  Administered 2014-08-25 – 2014-08-28 (×8): 10 mg via ORAL
  Filled 2014-08-25 (×12): qty 1

## 2014-08-25 MED ORDER — INSULIN ASPART 100 UNIT/ML ~~LOC~~ SOLN
0.0000 [IU] | Freq: Three times a day (TID) | SUBCUTANEOUS | Status: DC
  Administered 2014-08-26: 3 [IU] via SUBCUTANEOUS
  Administered 2014-08-26: 2 [IU] via SUBCUTANEOUS
  Administered 2014-08-26: 3 [IU] via SUBCUTANEOUS
  Administered 2014-08-27: 2 [IU] via SUBCUTANEOUS
  Administered 2014-08-27 (×2): 3 [IU] via SUBCUTANEOUS
  Administered 2014-08-28 – 2014-08-29 (×2): 2 [IU] via SUBCUTANEOUS
  Administered 2014-08-29: 3 [IU] via SUBCUTANEOUS
  Administered 2014-08-30 – 2014-09-03 (×9): 2 [IU] via SUBCUTANEOUS

## 2014-08-25 MED ORDER — CAPTOPRIL 12.5 MG PO TABS
12.5000 mg | ORAL_TABLET | Freq: Three times a day (TID) | ORAL | Status: DC
  Filled 2014-08-25 (×2): qty 1

## 2014-08-25 NOTE — Progress Notes (Addendum)
Chart reviewed.   PROGRESS NOTE  Douglas Mann WNI:[id number] DOB: 10/23/1946 DOA: 08/13/2014 PCP: [name], MD  68 year old male with 75 pack active smoker, full code with known CAD and s/p stent at [facility] > 10 years ago and copd with o2 dependency Admitted 08/13/14  with  a chief complaint of bleeding in his bowels and a swollen abdomen along with a story that sounded like mesentric angina (post prandial abd discmfrt and weight Loss and ability to handle only small amounts of food at at time). At admission he ruled out for MI. Then on evening of 08/15/14 he develped sudden onset chest pain, with sinus tachycardia with EKG change to duffise ST depression in inferior and lateral leads and respiratory distress with CXR suggestive of flash pulmonary edema. Despite lasix he developed 2 x episodes of V Tach with normal BP and needing IV amio (never needed cpr). Patient emergently intubated on [place] floor and moved to ICU at [facility].   8/30 he was extubated  Then on 8/31 early AM: re-intubated. Edema pattern on CXR. Milrinone initiated by Cardiology  9/01 Extubated. Remained slightly encephalopathic due to residual sedating medications. Discussed re-intubation status with pt (who did not seem to fully grasp the conversation) and his daughter who feels certain that he would not wish to undergo repeat intubation or ACLS in this situation where there are very limited treatment options and where recurrent respiratory fialure would be indicative of failure of those few options  Assessment/Plan: Acute on chronic systolic heart failure-  Per cardiology Per cardiology, likely d/c milrinone  -lasix. Blood pressure soft  Severe oxygen requiring COPD - wears O2 at home and still smokes Change to PO steroids Continue levaquin, nebs  Status post salvage LAD stent in the setting of cardiogenic shock and pulmonary edema   Ventricular tachycardia, on amiodarone  AKI Creatinine stable  Chronic intermittent abd  pain, concern for mesenteric ischemia  Hematochezia, resolved Consider CTA abdomen/pelvis when clinically stable per GI recs No pain after eating  ABLA, now anemia of CD -transfuse for < 7. Heme positive stool. Not on heparin or lovenox. Not stable for colonoscopy at this time.  Hyperglycemia SSI  Patient refusing SNF- PT to follow   Code Status: DNR Family Communication: friend at bedside Disposition Plan:    Consultants:  PCCM  Cards  pallaitive care  Procedures:  cath   HPI/Subjective: Breathing about the same. Cough productive of clear sputum. Has not been oob  Objective: Filed Vitals:   08/25/14 0600  BP: 82/56  Pulse: 95  Temp:   Resp: 15    Intake/Output Summary (Last 24 hours) at 08/25/14 0807 Last data filed at 08/25/14 0600  Gross per 24 hour  Intake   1200 ml  Output    970 ml  Net    230 ml   Filed Weights   08/20/14 0439 08/21/14 0800 08/23/14 1200  Weight: 80.9 kg (178 lb 5.6 oz) 80 kg (176 lb 5.9 oz) 75 kg (165 lb 5.5 oz)    Exam:   General:  Comfortable. Weak. Eating breakfast. Breathing nonlabored  Cardiovascular: rrr without MGR  Respiratory: CTA without WRR  Abdomen: +BS, soft  Ext: SCDs. No CCE  Data Reviewed: Basic Metabolic Panel:  Recent Labs Lab 08/19/14 0330  08/23/14 0523 08/23/14 1840 08/24/14 0455 08/24/14 1823 08/25/14 0400  NA 136*  < > 136* 137 139 138 138  K 3.6*  < > 3.9 4.0 4.0 3.5* 4.0  CL 99  < >  99 98 101 99 101  CO2 23  < > 26 26 27 24 26   GLUCOSE 161*  < > 154* 148* 150* 184* 186*  BUN 31*  < > 29* 38* 41* 49* 52*  CREATININE 2.22*  < > 1.75* 2.17* 2.01* 1.95* 1.89*  CALCIUM 8.5  < > 8.5 8.2* 8.2* 8.2* 8.2*  MG 2.6*  --  2.3 2.5 2.4 2.3 2.4  PHOS 4.9*  --  2.6  --  4.3  --  3.0  < > = values in this interval not displayed. Liver Function Tests:  Recent Labs Lab 08/22/14 1150  AST 37  ALT 27  ALKPHOS 112  BILITOT 0.4  PROT 6.2  ALBUMIN 2.4*   No results found for this basename:  LIPASE, AMYLASE,  in the last 168 hours No results found for this basename: AMMONIA,  in the last 168 hours CBC:  Recent Labs Lab 08/19/14 0330  08/21/14 0344 08/22/14 1150 08/23/14 0523 08/24/14 0455 08/25/14 0400  WBC 18.1*  < > 9.3 11.5* 11.7* 11.5* 11.2*  NEUTROABS 16.4*  --   --   --  10.5* 10.4* 10.3*  HGB 9.5*  < > 8.3* 8.2* 7.9* 7.7* 7.3*  HCT 28.2*  < > 24.5* 24.9* 23.7* 23.4* 22.0*  MCV 87.0  < > 88.4 88.0 88.4 90.3 89.8  PLT 181  < > 142* 189 202 225 252  < > = values in this interval not displayed. Cardiac Enzymes:  Recent Labs Lab 08/20/14 0500  TROPONINI 11.82*   BNP (last 3 results)  Recent Labs  08/15/14 1956 08/20/14 0500 08/22/14 1150  PROBNP 2319.0* 51736.0* 43395.0*   CBG:  Recent Labs Lab 08/24/14 1610 08/24/14 2018 08/24/14 2314 08/25/14 0419 08/25/14 0737  GLUCAP 166* 205* 160* 180* 174*    Recent Results (from the past 240 hour(s))  MRSA PCR SCREENING     Status: None   Collection Time    08/16/14  1:59 AM      Result Value Ref Range Status   MRSA by PCR NEGATIVE  NEGATIVE Final   Comment:            The GeneXpert MRSA Assay (FDA     approved for NASAL specimens     only), is one component of a     comprehensive MRSA colonization     surveillance program. It is not     intended to diagnose MRSA     infection nor to guide or     monitor treatment for     MRSA infections.  CULTURE, RESPIRATORY (NON-EXPECTORATED)     Status: None   Collection Time    08/16/14 11:48 AM      Result Value Ref Range Status   Specimen Description TRACHEAL ASPIRATE   Final   Special Requests NONE   Final   Gram Stain     Final   Value: FEW WBC PRESENT,BOTH PMN AND MONONUCLEAR     RARE SQUAMOUS EPITHELIAL CELLS PRESENT     FEW GRAM POSITIVE COCCI     IN PAIRS IN CLUSTERS RARE GRAM NEGATIVE RODS     Performed at [facility]   Culture     Final   Value: Non-Pathogenic Oropharyngeal-type Flora Isolated.     Performed at [facility]   Report Status 08/18/2014 FINAL   Final  CULTURE, BLOOD (ROUTINE X 2)     Status: None   Collection Time    08/16/14 12:25 PM  Result Value Ref Range Status   Specimen Description BLOOD RIGHT HAND   Final   Special Requests BOTTLES DRAWN AEROBIC AND ANAEROBIC 5CC   Final   Culture  Setup Time     Final   Value: 08/16/2014 17:16     Performed at [facility]   Culture     Final   Value: NO GROWTH 5 DAYS     Performed at [facility]   Report Status 08/22/2014 FINAL   Final  CULTURE, BLOOD (ROUTINE X 2)     Status: None   Collection Time    08/16/14 12:40 PM      Result Value Ref Range Status   Specimen Description BLOOD LEFT HAND   Final   Special Requests BOTTLES DRAWN AEROBIC AND ANAEROBIC 5CC   Final   Culture  Setup Time     Final   Value: 08/16/2014 17:16     Performed at [facility]   Culture     Final   Value: NO GROWTH 5 DAYS     Performed at [facility]   Report Status 08/22/2014 FINAL   Final  CLOSTRIDIUM DIFFICILE BY PCR     Status: None   Collection Time    08/20/14  5:06 PM      Result Value Ref Range Status   C difficile by pcr NEGATIVE  NEGATIVE Final     Studies: Dg Chest Port 1 View  08/23/2014   CLINICAL DATA:  Shortness of Breath  EXAM: PORTABLE CHEST - 1 VIEW  COMPARISON:  08/22/2014  FINDINGS: Cardiomediastinal silhouette is stable. Left IJ central line is unchanged in position. Persistent bilateral mild congestion/edema. Probable bilateral small pleural effusion with bilateral basilar atelectasis or infiltrate.  IMPRESSION: Persistent bilateral mild congestion/edema. Probable bilateral small pleural effusion with bilateral basilar atelectasis or infiltrate.   Electronically Signed   By: Lahoma Crocker M.D.   On: 08/23/2014 11:11    Scheduled Meds: . amiodarone  200 mg Oral BID  . aspirin  81 mg Oral Daily  . atorvastatin  80 mg Oral q1800  . budesonide (PULMICORT) nebulizer solution  0.5 mg Nebulization BID   . captopril  6.25 mg Oral TID  . clopidogrel  75 mg Oral Daily  . feeding supplement (RESOURCE BREEZE)  1 Container Oral TID BM  . fluticasone  2 spray Each Nare Daily  . furosemide  80 mg Intravenous Daily  . guaiFENesin  600 mg Oral BID  . hydrocortisone   Rectal BID  . insulin aspart  0-15 Units Subcutaneous 6 times per day  . ipratropium-albuterol  3 mL Nebulization Q4H  . levofloxacin  750 mg Oral Q48H  . methylPREDNISolone (SOLU-MEDROL) injection  60 mg Intravenous 3 times per day  . sodium chloride  3 mL Intravenous Q12H  . sodium chloride  3 mL Intravenous Q12H  . triamcinolone cream   Topical BID   Continuous Infusions: . sodium chloride 7 mL/hr (08/23/14 1255)  . milrinone 0.125 mcg/kg/min (08/24/14 1811)   Antibiotics Given (last 72 hours)   None         Time spent: 35 min    [facility] Pager [phone]. If 7PM-7AM, please contact night-coverage at [email or web address], password [facility] 08/25/2014, 8:07 AM  LOS: 12 days

## 2014-08-25 NOTE — Progress Notes (Addendum)
       Patient Name: Douglas Mann Date of Encounter: 08/25/2014    SUBJECTIVE:    SOB is stable, denies CP.  Feel comfortable he says  TELEMETRY:   sinus rhythm with frequent PVCs, no sustained VT but very frequent ventricular ectopy Filed Vitals:   08/25/14 0600 08/25/14 0700 08/25/14 0800 08/25/14 0859  BP: 82/56 91/44 90/56    Pulse: 95 100 100   Temp:   98.2 F (36.8 C)   TempSrc:   Oral   Resp: 15 14 23    Height:      Weight:      SpO2: 99% 96% 99% 99%    Intake/Output Summary (Last 24 hours) at 08/25/14 1039 Last data filed at 08/25/14 0900  Gross per 24 hour  Intake   1070 ml  Output   1145 ml  Net    -75 ml   LABS: Basic Metabolic Panel:  Recent Labs  08/24/14 0455 08/24/14 1823 08/25/14 0400  NA 139 138 138  K 4.0 3.5* 4.0  CL 101 99 101  CO2 27 24 26   GLUCOSE 150* 184* 186*  BUN 41* 49* 52*  CREATININE 2.01* 1.95* 1.89*  CALCIUM 8.2* 8.2* 8.2*  MG 2.4 2.3 2.4  PHOS 4.3  --  3.0   CBC:  Recent Labs  08/24/14 0455 08/25/14 0400  WBC 11.5* 11.2*  NEUTROABS 10.4* 10.3*  HGB 7.7* 7.3*  HCT 23.4* 22.0*  MCV 90.3 89.8  PLT 225 252     Physical Exam: Blood pressure 90/56, pulse 100, temperature 98.2 F (36.8 C), temperature source Oral, resp. rate 23, height 5\' 7"  (1.702 m), weight 165 lb 5.5 oz (75 kg), SpO2 99.00%. Weight change:   Wt Readings from Last 3 Encounters:  08/23/14 165 lb 5.5 oz (75 kg)  08/23/14 165 lb 5.5 oz (75 kg)  08/23/14 165 lb 5.5 oz (75 kg)  chronically ill appearing, alert and pleasant, comfortable with face mask HEENT OP clear Neck supple, L CVL in place CV RRR with ectopy Diffuse expiratory wheezes with a prolonged expiratory phase, no rales Abd: soft Neuro exam strength and sensation are intact Ext no edema  ASSESSMENT:  1. Acute on chronic systolic heart failure/ICM- Stop milrinone today Continue IV lasix to 80mg  daily  Strict Is and Os Follow renal function.  Add low-dose hydralazine Titrate ace  inhibitor Follow CVP  2. Severe oxygen requiring COPD- This appears to be his primary issue today. Continue pulmonary toilet per pulmonary.  Prognosis is poor  3. CAD-Status post salvage LAD stent in the setting of cardiogenic shock and pulmonary edema, with a nice angiographic result and now gradual improvement. Continue aspirin, Plavix and statin.  4. Ventricular tachycardia- likely exacerbated by milrinone Continue amiodarone to 200mg  BID and stop milrinone  5 chronic kidney disease-watch renal function closely with diuresis and addition of ACEI  Patient's prognosis is very poor.   He is clear that he wishes for DNI/DNR.  He is not ready for full palliative measures  The patient is critically ill with multiple organ systems failure and requires high complexity decision making for assessment and support, frequent evaluation and titration of therapies, application of advanced monitoring technologies and extensive interpretation of multiple databases.   Total CCT spent directly with the patient today is 30 minutes     Signed, Thompson Grayer 08/25/2014, 10:39 AM

## 2014-08-25 NOTE — Progress Notes (Addendum)
08/25/14 2308  Dysrhythmia  Level of Consciousness Alert  Symptoms of Dysrhythmia None  Name of MD Notified Schor  Date MD notified 08/25/14  Time MD notified 2309   Patient having nonsustained VT. Magnesium level ordered. Will monitor.

## 2014-08-25 NOTE — Progress Notes (Signed)
Patient FB:Douglas Mann      DOB: Feb 23, 1946      OVA:[id number]   Palliative Medicine Team at [facility] Progress Note    Subjective: Feeling pretty good this morning. Breathing doing better and able to transition to [place]. Eating breakfast this AM.  Has some gas/bloating after eating but not much pain.  Denies nausea/voiting. No dyspnea at rest   Filed Vitals:   08/25/14 0800  BP: 90/56  Pulse: 100  Temp: 98.2 F (36.8 C)  Resp: 23   Physical exam: GEN: Alert, NAD  HEENT: [place], sclera anicteric  CV: regular rate  LUNGS: CTAB ABD: soft, NT, ND  EXT: no cyanosis   Assessment and plan: 68 yo male with CAD/ICM, COPD who presented with abdominal pain (cocnern for mesenteric ischemia) who developed acute respiratoyr distress and NSTEMI requiring intubation. Hospital course complicated by ongoing resp failure with COPD and heart failure contributions, AKI. Palliative Care consulted for goals of care.  1. Code Status: DNR   2. Goals of Care:  See initial consultation. Respiratory status remains tenuous but transitioned to nasal cannula yesterday and appears to be doing a bit better again today.  Has had trouble maintaining improved trajectory though.  Continued medical management and how he progresses or does not may help clarify goals.   3. Symptom Management:  Dyspnea- No further PRN dilaudid yesterday. Continued medical management  4. Psychosocial/Spiritual: Lives at home with [facility]. Divorced. First wife is deceased (dghtr Vera's mom). Divorced from 2nd wife and has daughter in [place] from that marriage. Close with his friend Al who lives next door to him. Has a sister Violet who has significant health problems as well as a brother who he describes as "dying from lung cancer". 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 Enjoys watching westerns.    Doran Clay D.O. Palliative Medicine Team at [facility]  Pager: [phone] Team Phone:  [phone]

## 2014-08-26 DIAGNOSIS — D649 Anemia, unspecified: Secondary | ICD-10-CM | POA: Diagnosis present

## 2014-08-26 LAB — FERRITIN: Ferritin: 136 ng/mL (ref 22–322)

## 2014-08-26 LAB — BASIC METABOLIC PANEL
Anion gap: 12 (ref 5–15)
BUN: 56 mg/dL — AB (ref 6–23)
CO2: 26 mEq/L (ref 19–32)
Calcium: 8.3 mg/dL — ABNORMAL LOW (ref 8.4–10.5)
Chloride: 100 mEq/L (ref 96–112)
Creatinine, Ser: 1.92 mg/dL — ABNORMAL HIGH (ref 0.50–1.35)
GFR calc Af Amer: 39 mL/min — ABNORMAL LOW (ref >90)
GFR, EST NON AFRICAN AMERICAN: 34 mL/min — AB (ref >90)
Glucose, Bld: 173 mg/dL — ABNORMAL HIGH (ref 70–99)
POTASSIUM: 3.9 meq/L (ref 3.7–5.3)
Sodium: 138 mEq/L (ref 137–147)

## 2014-08-26 LAB — CBC WITH DIFFERENTIAL/PLATELET
BASOS PCT: 0 % (ref 0–1)
Basophils Absolute: 0 10*3/uL (ref 0.0–0.1)
EOS ABS: 0 10*3/uL (ref 0.0–0.7)
Eosinophils Relative: 0 % (ref 0–5)
HCT: 22.2 % — ABNORMAL LOW (ref 39.0–52.0)
Hemoglobin: 7.1 g/dL — ABNORMAL LOW (ref 13.0–17.0)
Lymphocytes Relative: 3 % — ABNORMAL LOW (ref 12–46)
Lymphs Abs: 0.4 10*3/uL — ABNORMAL LOW (ref 0.7–4.0)
MCH: 28 pg (ref 26.0–34.0)
MCHC: 32 g/dL (ref 30.0–36.0)
MCV: 87.4 fL (ref 78.0–100.0)
Monocytes Absolute: 0.5 10*3/uL (ref 0.1–1.0)
Monocytes Relative: 5 % (ref 3–12)
Neutro Abs: 10.4 10*3/uL — ABNORMAL HIGH (ref 1.7–7.7)
Neutrophils Relative %: 92 % — ABNORMAL HIGH (ref 43–77)
PLATELETS: 277 10*3/uL (ref 150–400)
RBC: 2.54 MIL/uL — ABNORMAL LOW (ref 4.22–5.81)
RDW: 14.9 % (ref 11.5–15.5)
WBC: 11.3 10*3/uL — ABNORMAL HIGH (ref 4.0–10.5)

## 2014-08-26 LAB — IRON AND TIBC
Iron: 53 ug/dL (ref 42–135)
SATURATION RATIOS: 17 % — AB (ref 20–55)
TIBC: 310 ug/dL (ref 215–435)
UIBC: 257 ug/dL (ref 125–400)

## 2014-08-26 LAB — GLUCOSE, CAPILLARY
GLUCOSE-CAPILLARY: 166 mg/dL — AB (ref 70–99)
Glucose-Capillary: 162 mg/dL — ABNORMAL HIGH (ref 70–99)
Glucose-Capillary: 143 mg/dL — ABNORMAL HIGH (ref 70–99)
Glucose-Capillary: 204 mg/dL — ABNORMAL HIGH (ref 70–99)

## 2014-08-26 LAB — PREPARE RBC (CROSSMATCH)

## 2014-08-26 LAB — VITAMIN B12: Vitamin B-12: 1034 pg/mL — ABNORMAL HIGH (ref 211–911)

## 2014-08-26 MED ORDER — AMIODARONE HCL IN DEXTROSE 360-4.14 MG/200ML-% IV SOLN
60.0000 mg/h | INTRAVENOUS | Status: AC
  Administered 2014-08-26: 60 mg/h via INTRAVENOUS

## 2014-08-26 MED ORDER — AMIODARONE HCL 200 MG PO TABS
400.0000 mg | ORAL_TABLET | Freq: Two times a day (BID) | ORAL | Status: DC
  Administered 2014-08-26 – 2014-09-03 (×16): 400 mg via ORAL
  Filled 2014-08-26 (×17): qty 2

## 2014-08-26 MED ORDER — OXYCODONE HCL 5 MG PO TABS
5.0000 mg | ORAL_TABLET | ORAL | Status: DC | PRN
  Administered 2014-08-26 – 2014-09-02 (×7): 5 mg via ORAL
  Filled 2014-08-26 (×7): qty 1

## 2014-08-26 MED ORDER — AMIODARONE HCL IN DEXTROSE 360-4.14 MG/200ML-% IV SOLN
30.0000 mg/h | INTRAVENOUS | Status: DC
  Administered 2014-08-26 – 2014-08-27 (×2): 30 mg/h via INTRAVENOUS
  Filled 2014-08-26 (×7): qty 200

## 2014-08-26 MED ORDER — DOCUSATE SODIUM 100 MG PO CAPS
100.0000 mg | ORAL_CAPSULE | Freq: Two times a day (BID) | ORAL | Status: DC
  Administered 2014-08-26 – 2014-09-03 (×16): 100 mg via ORAL
  Filled 2014-08-26 (×18): qty 1

## 2014-08-26 MED ORDER — ALBUTEROL SULFATE (2.5 MG/3ML) 0.083% IN NEBU: 2.5000 mg | INHALATION_SOLUTION | RESPIRATORY_TRACT | Status: DC | PRN

## 2014-08-26 MED ORDER — AMIODARONE HCL IN DEXTROSE 360-4.14 MG/200ML-% IV SOLN
INTRAVENOUS | Status: AC
  Filled 2014-08-26: qty 200

## 2014-08-26 MED ORDER — SENNA 8.6 MG PO TABS
2.0000 | ORAL_TABLET | Freq: Every day | ORAL | Status: DC | PRN
  Filled 2014-08-26: qty 2

## 2014-08-26 MED ORDER — IPRATROPIUM-ALBUTEROL 0.5-2.5 (3) MG/3ML IN SOLN
3.0000 mL | Freq: Three times a day (TID) | RESPIRATORY_TRACT | Status: DC
  Administered 2014-08-26 – 2014-08-27 (×3): 3 mL via RESPIRATORY_TRACT
  Filled 2014-08-26 (×3): qty 3

## 2014-08-26 MED ORDER — FUROSEMIDE 10 MG/ML IJ SOLN
20.0000 mg | Freq: Once | INTRAMUSCULAR | Status: AC
  Administered 2014-08-26: 20 mg via INTRAVENOUS
  Filled 2014-08-26: qty 2

## 2014-08-26 NOTE — Progress Notes (Signed)
Patient Douglas Mann      DOB: [birth year]      KZL:[id number]   Palliative Medicine Team at [facility] Progress Note    Subjective: Continues to slowly feel better. Did well off milrinone drip. Has some gas/bloating after eating still.  No gross blood in stool that he reports. No chest pain.    Filed Vitals:   08/26/14 0800  BP: 117/80  Pulse:   Temp: 97.9 F (36.6 C)  Resp: 16   Physical exam: GEN: Alert, NAD  HEENT: [place], sclera anicteric  CV: mild tachycardia LUNGS: CTAB  ABD: soft, NT, ND  EXT: no cyanosis, no edema   CBC    Component Value Date/Time   WBC 11.3* 08/26/2014 0410   RBC 2.54* 08/26/2014 0410   HGB 7.1* 08/26/2014 0410   HCT 22.2* 08/26/2014 0410   PLT 277 08/26/2014 0410   MCV 87.4 08/26/2014 0410   MCH 28.0 08/26/2014 0410   MCHC 32.0 08/26/2014 0410   RDW 14.9 08/26/2014 0410   LYMPHSABS 0.4* 08/26/2014 0410   MONOABS 0.5 08/26/2014 0410   EOSABS 0.0 08/26/2014 0410   BASOSABS 0.0 08/26/2014 0410    CMP     Component Value Date/Time   NA 138 08/26/2014 0410   K 3.9 08/26/2014 0410   CL 100 08/26/2014 0410   CO2 26 08/26/2014 0410   GLUCOSE 173* 08/26/2014 0410   BUN 56* 08/26/2014 0410   CREATININE 1.92* 08/26/2014 0410   CALCIUM 8.3* 08/26/2014 0410   PROT 6.2 08/22/2014 1150   ALBUMIN 2.4* 08/22/2014 1150   AST 37 08/22/2014 1150   ALT 27 08/22/2014 1150   ALKPHOS 112 08/22/2014 1150   BILITOT 0.4 08/22/2014 1150   GFRNONAA 34* 08/26/2014 0410   GFRAA 39* 08/26/2014 0410     Assessment and plan: 68 yo male with CAD/ICM, COPD who presented with abdominal pain (cocnern for mesenteric ischemia) who developed acute respiratoyr distress and NSTEMI requiring intubation. Hospital course complicated by ongoing resp failure with COPD and heart failure contributions, AKI. Palliative Care consulted for goals of care.   1. Code Status: DNR   2. Goals of Care:  See initial consultation. Now off milrinone.  His breathing has been improving and consistently been able to stay on Nasal Cannula.   Worry still remains about how he will do given his tendency for decompensation.  He continues to want to go home rather than rehab when he is ready for d/c.  If he were to have decline at home he would plan to come back to hospital. Talked some through concern that it may be difficult to get him back to where he was.  I do not feel like he has great insight into his complex medical conditions.   3. Symptom Management:  Dyspnea- only 1 dose of PRN iv dilaudid in past 2 days. Will d/c and switch to oral oxycodone.  To receive transfusion today.  Resp status improving and now off milrinone.   4. Psychosocial/Spiritual: Lives at home with [facility]. Divorced. First wife is deceased (dghtr Vera's mom). Divorced from 2nd wife and has daughter in [place] from that marriage. Close with his friend Al who lives next door to him. Has a sister Violet who has significant health problems as well as a brother who he describes as "dying from lung cancer". 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 Enjoys watching westerns.   Doran Clay D.O. Palliative Medicine Team at [facility]  Pager: [phone] Team Phone: [phone]

## 2014-08-26 NOTE — Progress Notes (Signed)
ANTIBIOTIC CONSULT NOTE - INITIAL  Pharmacy Consult for levaquin Indication: pneumonia  No Known Allergies  Patient Measurements: Height: 5\' 7"  (170.2 cm) Weight: 165 lb 5.5 oz (75 kg) IBW/kg (Calculated) : 66.1  Vital Signs: Temp: 98.1 F (36.7 C) (09/07 1310) Temp src: Oral (09/07 1310) BP: 87/43 mmHg (09/07 1310) Pulse Rate: 83 (09/07 1310) Intake/Output from previous day: 09/06 0701 - 09/07 0700 In: 770 [P.O.:710; I.V.:60] Out: 1470 [Urine:1470] Intake/Output from this shift: Total I/O In: 120 [P.O.:120] Out: 250 [Urine:250]  Labs:  Recent Labs  08/24/14 0455 08/24/14 1823 08/25/14 0400 08/26/14 0410  WBC 11.5*  --  11.2* 11.3*  HGB 7.7*  --  7.3* 7.1*  PLT 225  --  252 277  CREATININE 2.01* 1.95* 1.89* 1.92*   Estimated Creatinine Clearance: 33.5 ml/min (by C-G formula based on Cr of 1.92). No results found for this basename: VANCOTROUGH, Corlis Leak, VANCORANDOM, [place], GENTPEAK, Ferdinand, Fitzhugh, TOBRAPEAK, TOBRARND, AMIKACINPEAK, AMIKACINTROU, AMIKACIN,  in the last 72 hours   Microbiology: Recent Results (from the past 720 hour(s))  MRSA PCR SCREENING     Status: None   Collection Time    08/16/14  1:59 AM      Result Value Ref Range Status   MRSA by PCR NEGATIVE  NEGATIVE Final   Comment:            The GeneXpert MRSA Assay (FDA     approved for NASAL specimens     only), is one component of a     comprehensive MRSA colonization     surveillance program. It is not     intended to diagnose MRSA     infection nor to guide or     monitor treatment for     MRSA infections.  CULTURE, RESPIRATORY (NON-EXPECTORATED)     Status: None   Collection Time    08/16/14 11:48 AM      Result Value Ref Range Status   Specimen Description TRACHEAL ASPIRATE   Final   Special Requests NONE   Final   Gram Stain     Final   Value: FEW WBC PRESENT,BOTH PMN AND MONONUCLEAR     RARE SQUAMOUS EPITHELIAL CELLS PRESENT     FEW GRAM POSITIVE COCCI     IN  PAIRS IN CLUSTERS RARE GRAM NEGATIVE RODS     Performed at [facility]   Culture     Final   Value: Non-Pathogenic Oropharyngeal-type Flora Isolated.     Performed at [facility]   Report Status 08/18/2014 FINAL   Final  CULTURE, BLOOD (ROUTINE X 2)     Status: None   Collection Time    08/16/14 12:25 PM      Result Value Ref Range Status   Specimen Description BLOOD RIGHT HAND   Final   Special Requests BOTTLES DRAWN AEROBIC AND ANAEROBIC 5CC   Final   Culture  Setup Time     Final   Value: 08/16/2014 17:16     Performed at [facility]   Culture     Final   Value: NO GROWTH 5 DAYS     Performed at [facility]   Report Status 08/22/2014 FINAL   Final  CULTURE, BLOOD (ROUTINE X 2)     Status: None   Collection Time    08/16/14 12:40 PM      Result Value Ref Range Status   Specimen Description BLOOD LEFT HAND   Final   Special Requests  BOTTLES DRAWN AEROBIC AND ANAEROBIC 5CC   Final   Culture  Setup Time     Final   Value: 08/16/2014 17:16     Performed at [facility]   Culture     Final   Value: NO GROWTH 5 DAYS     Performed at [facility]   Report Status 08/22/2014 FINAL   Final  CLOSTRIDIUM DIFFICILE BY PCR     Status: None   Collection Time    08/20/14  5:06 PM      Result Value Ref Range Status   C difficile by pcr NEGATIVE  NEGATIVE Final    Medical History: Past Medical History  Diagnosis Date  . HYPERLIPIDEMIA 10/01/2009  . HYPERTENSION 10/01/2009  . CAD 10/01/2009    Stent at [place] greater than 10 years ago  . PVD 10/01/2009  . ALLERGIC RHINITIS 12/24/2009  . COPD 10/01/2009  . OSTEOARTHRITIS, GENERALIZED, MULTIPLE JOINTS 12/24/2009    Assessment: 68 yo male with complicated hospital course initially for GIB, acute respiratory failure, STEMI s/p emergently cath and IABP (removed 8/30), now extubated, but still having significant respiratory distress. Pharmacy is consulted to start levaquin empiric for CAP,  now day #4. He remains afebrile, wbc 11.3 (on steroids). Scr remains elevated and relatively stable at 1.92, est. crcl ~ 30-35 ml/min.   Plan:  - Continue Levaquin 750 mg PO Q 48 hrs x 8 days total - F/u clinical course and renal funciton  Harolyn Rutherford, PharmD Clinical Pharmacist - Resident Pager: [phone] Pharmacy: [phone] 08/26/2014 1:12 PM

## 2014-08-26 NOTE — Progress Notes (Signed)
SUBJECTIVE:  Mild abdominal bloating but no acute pain.  No acute SOB.  Breathing at baseline.     PHYSICAL EXAM Filed Vitals:   08/25/14 2354 08/25/14 2355 08/26/14 0000 08/26/14 0400  BP:   93/66 94/54  Pulse:   95 92  Temp:  98.4 F (36.9 C)  98.3 F (36.8 C)  TempSrc:  Oral  Oral  Resp:   25 20  Height:      Weight:      SpO2: 97%  100% 98%   General:  No distress Lungs:  Decreased breath sounds Heart:  RRR Abdomen:  Positive bowel sounds, no rebound no guarding, mildly distended Extremities:  No edema Neuro:  Nonfocal, very clear  LABS:   Results for orders placed during the hospital encounter of 08/13/14 (from the past 24 hour(s))  GLUCOSE, CAPILLARY     Status: Abnormal   Collection Time    08/25/14  7:37 AM      Result Value Ref Range   Glucose-Capillary 174 (*) 70 - 99 mg/dL  GLUCOSE, CAPILLARY     Status: Abnormal   Collection Time    08/25/14 12:23 PM      Result Value Ref Range   Glucose-Capillary 179 (*) 70 - 99 mg/dL  GLUCOSE, CAPILLARY     Status: Abnormal   Collection Time    08/25/14  4:20 PM      Result Value Ref Range   Glucose-Capillary 180 (*) 70 - 99 mg/dL  GLUCOSE, CAPILLARY     Status: Abnormal   Collection Time    08/25/14  8:09 PM      Result Value Ref Range   Glucose-Capillary 175 (*) 70 - 99 mg/dL  MAGNESIUM     Status: None   Collection Time    08/25/14 11:04 PM      Result Value Ref Range   Magnesium 2.3  1.5 - 2.5 mg/dL  BASIC METABOLIC PANEL     Status: Abnormal   Collection Time    08/26/14  4:10 AM      Result Value Ref Range   Sodium 138  137 - 147 mEq/L   Potassium 3.9  3.7 - 5.3 mEq/L   Chloride 100  96 - 112 mEq/L   CO2 26  19 - 32 mEq/L   Glucose, Bld 173 (*) 70 - 99 mg/dL   BUN 56 (*) 6 - 23 mg/dL   Creatinine, Ser 1.92 (*) 0.50 - 1.35 mg/dL   Calcium 8.3 (*) 8.4 - 10.5 mg/dL   GFR calc non Af Amer 34 (*) >90 mL/min   GFR calc Af Amer 39 (*) >90 mL/min   Anion gap 12  5 - 15  CBC WITH DIFFERENTIAL      Status: Abnormal   Collection Time    08/26/14  4:10 AM      Result Value Ref Range   WBC 11.3 (*) 4.0 - 10.5 K/uL   RBC 2.54 (*) 4.22 - 5.81 MIL/uL   Hemoglobin 7.1 (*) 13.0 - 17.0 g/dL   HCT 22.2 (*) 39.0 - 52.0 %   MCV 87.4  78.0 - 100.0 fL   MCH 28.0  26.0 - 34.0 pg   MCHC 32.0  30.0 - 36.0 g/dL   RDW 14.9  11.5 - 15.5 %   Platelets 277  150 - 400 K/uL   Neutrophils Relative % 92 (*) 43 - 77 %   Neutro Abs 10.4 (*) 1.7 - 7.7 K/uL  Lymphocytes Relative 3 (*) 12 - 46 %   Lymphs Abs 0.4 (*) 0.7 - 4.0 K/uL   Monocytes Relative 5  3 - 12 %   Monocytes Absolute 0.5  0.1 - 1.0 K/uL   Eosinophils Relative 0  0 - 5 %   Eosinophils Absolute 0.0  0.0 - 0.7 K/uL   Basophils Relative 0  0 - 1 %   Basophils Absolute 0.0  0.0 - 0.1 K/uL    Intake/Output Summary (Last 24 hours) at 08/26/14 0730 Last data filed at 08/26/14 0600  Gross per 24 hour  Intake    770 ml  Output   1470 ml  Net   -700 ml     ASSESSMENT AND PLAN:  CAD/ANTERIOR MI:  Continues on ASA and Plavix.   No evidence of active ischemia.  EF 35%.  On Captopril.  He appears to be tolerating a low dose of captopril although his creat is up slightly.  Off milrninone.    COPD:   Continue per primary team and CCM  CKD:  Creat is essentially stable.   VENTRICULAR TACHYCARDIA:  No further ventricular tach.  Continue IV amiodarone.  ANEMIA:  Guaiac positive and Hgb continues to drift down.   I would suggest transfuse one unit slowly with Lasix 20 mg IV to follow.  I will defer to the primary team.     Minus Breeding 08/26/2014 7:30 AM

## 2014-08-26 NOTE — Progress Notes (Signed)
PROGRESS NOTE  Douglas Mann VWU:[id number] DOB: [birth year] DOA: 08/13/2014 PCP: [name], MD  68 year old male with 36 pack active smoker, full code with known CAD and s/p stent at [facility] > 10 years ago and copd with o2 dependency Admitted 08/13/14  with  a chief complaint of bleeding in his bowels and a swollen abdomen along with a story that sounded like mesentric angina (post prandial abd discmfrt and weight Loss and ability to handle only small amounts of food at at time). At admission he ruled out for MI. Then on evening of 08/15/14 he develped sudden onset chest pain, with sinus tachycardia with EKG change to duffise ST depression in inferior and lateral leads and respiratory distress with CXR suggestive of flash pulmonary edema. Despite lasix he developed 2 x episodes of V Tach with normal BP and needing IV amio (never needed cpr). Patient emergently intubated on  floor and moved to ICU at [facility].   8/30 he was extubated  Then on 8/31 early AM: re-intubated. Edema pattern on CXR. Milrinone initiated by Cardiology  9/01 Extubated. Remained slightly encephalopathic due to residual sedating medications. Discussed re-intubation status with pt (who did not seem to fully grasp the conversation) and his daughter who feels certain that he would not wish to undergo repeat intubation or ACLS in this situation where there are very limited treatment options and where recurrent respiratory fialure would be indicative of failure of those few options  Assessment/Plan: Acute on chronic systolic heart failure-  Per cardiology  Severe oxygen requiring COPD - wears O2 at home and still smokes Continue current  Status post salvage LAD stent in the setting of cardiogenic shock and pulmonary edema   Ventricular tachycardia, on amiodarone  AKI Creatinine stable  Chronic intermittent abd pain, concern for mesenteric ischemia  Hematochezia, resolved Consider CTA abdomen/pelvis when clinically stable  per GI recs No pain after eating  Anemia secondary to blood loss and critical illness Agree with transfusion 1 unit pRBC. Not stable for colonoscopy at this time. Check anemia panel r/o deficiencies. Transfer to tele after transfusion if remains stable  Hyperglycemia SSI  OOB   Code Status: DNR Family Communication: friend at bedside Disposition Plan:    Consultants:  PCCM  Cards  pallaitive care  Procedures:  cath   HPI/Subjective: Feels jumpy and nervous. Breathing about the same. No gross bleeding. RN confirms  Objective: Filed Vitals:   08/26/14 0800  BP: 117/80  Pulse:   Temp: 97.9 F (36.6 C)  Resp: 16    Intake/Output Summary (Last 24 hours) at 08/26/14 0820 Last data filed at 08/26/14 0600  Gross per 24 hour  Intake    640 ml  Output   1470 ml  Net   -830 ml   Filed Weights   08/20/14 0439 08/21/14 0800 08/23/14 1200  Weight: 80.9 kg (178 lb 5.6 oz) 80 kg (176 lb 5.9 oz) 75 kg (165 lb 5.5 oz)    Exam:   General:  Comfortable. Weak. Eating breakfast. Breathing nonlabored  Cardiovascular: rrr without MGR  Respiratory: bilateral wheeze  Abdomen: +BS, soft, NT, ND  Ext: SCDs. No CCE. SCDs  Data Reviewed: Basic Metabolic Panel:  Recent Labs Lab 08/23/14 0523 08/23/14 1840 08/24/14 0455 08/24/14 1823 08/25/14 0400 08/25/14 2304 08/26/14 0410  NA 136* 137 139 138 138  --  138  K 3.9 4.0 4.0 3.5* 4.0  --  3.9  CL 99 98 101 99 101  --  100  CO2  26 26 27 24 26   --  26  GLUCOSE 154* 148* 150* 184* 186*  --  173*  BUN 29* 38* 41* 49* 52*  --  56*  CREATININE 1.75* 2.17* 2.01* 1.95* 1.89*  --  1.92*  CALCIUM 8.5 8.2* 8.2* 8.2* 8.2*  --  8.3*  MG 2.3 2.5 2.4 2.3 2.4 2.3  --   PHOS 2.6  --  4.3  --  3.0  --   --    Liver Function Tests:  Recent Labs Lab 08/22/14 1150  AST 37  ALT 27  ALKPHOS 112  BILITOT 0.4  PROT 6.2  ALBUMIN 2.4*   No results found for this basename: LIPASE, AMYLASE,  in the last 168 hours No results  found for this basename: AMMONIA,  in the last 168 hours CBC:  Recent Labs Lab 08/22/14 1150 08/23/14 0523 08/24/14 0455 08/25/14 0400 08/26/14 0410  WBC 11.5* 11.7* 11.5* 11.2* 11.3*  NEUTROABS  --  10.5* 10.4* 10.3* 10.4*  HGB 8.2* 7.9* 7.7* 7.3* 7.1*  HCT 24.9* 23.7* 23.4* 22.0* 22.2*  MCV 88.0 88.4 90.3 89.8 87.4  PLT 189 202 225 252 277   Cardiac Enzymes:  Recent Labs Lab 08/20/14 0500  TROPONINI 11.82*   BNP (last 3 results)  Recent Labs  08/15/14 1956 08/20/14 0500 08/22/14 1150  PROBNP 2319.0* 51736.0* 43395.0*   CBG:  Recent Labs Lab 08/25/14 0419 08/25/14 0737 08/25/14 1223 08/25/14 1620 08/25/14 2009  GLUCAP 180* 174* 179* 180* 175*    Recent Results (from the past 240 hour(s))  CULTURE, RESPIRATORY (NON-EXPECTORATED)     Status: None   Collection Time    08/16/14 11:48 AM      Result Value Ref Range Status   Specimen Description TRACHEAL ASPIRATE   Final   Special Requests NONE   Final   Gram Stain     Final   Value: FEW WBC PRESENT,BOTH PMN AND MONONUCLEAR     RARE SQUAMOUS EPITHELIAL CELLS PRESENT     FEW GRAM POSITIVE COCCI     IN PAIRS IN CLUSTERS RARE GRAM NEGATIVE RODS     Performed at [facility]   Culture     Final   Value: Non-Pathogenic Oropharyngeal-type Flora Isolated.     Performed at [facility]   Report Status 08/18/2014 FINAL   Final  CULTURE, BLOOD (ROUTINE X 2)     Status: None   Collection Time    08/16/14 12:25 PM      Result Value Ref Range Status   Specimen Description BLOOD RIGHT HAND   Final   Special Requests BOTTLES DRAWN AEROBIC AND ANAEROBIC 5CC   Final   Culture  Setup Time     Final   Value: 08/16/2014 17:16     Performed at [facility]   Culture     Final   Value: NO GROWTH 5 DAYS     Performed at [facility]   Report Status 08/22/2014 FINAL   Final  CULTURE, BLOOD (ROUTINE X 2)     Status: None   Collection Time    08/16/14 12:40 PM      Result Value Ref Range  Status   Specimen Description BLOOD LEFT HAND   Final   Special Requests BOTTLES DRAWN AEROBIC AND ANAEROBIC 5CC   Final   Culture  Setup Time     Final   Value: 08/16/2014 17:16     Performed at [facility]  Final   Value: NO GROWTH 5 DAYS     Performed at [facility]   Report Status 08/22/2014 FINAL   Final  CLOSTRIDIUM DIFFICILE BY PCR     Status: None   Collection Time    08/20/14  5:06 PM      Result Value Ref Range Status   C difficile by pcr NEGATIVE  NEGATIVE Final     Studies: No results found.  Scheduled Meds: . amiodarone  200 mg Oral BID  . aspirin  81 mg Oral Daily  . atorvastatin  80 mg Oral q1800  . budesonide (PULMICORT) nebulizer solution  0.5 mg Nebulization BID  . captopril  6.25 mg Oral TID  . clopidogrel  75 mg Oral Daily  . feeding supplement (RESOURCE BREEZE)  1 Container Oral TID BM  . fluticasone  2 spray Each Nare Daily  . furosemide  80 mg Intravenous Daily  . guaiFENesin  600 mg Oral BID  . hydrALAZINE  10 mg Oral 3 times per day  . hydrocortisone   Rectal BID  . insulin aspart  0-15 Units Subcutaneous TID WC  . ipratropium-albuterol  3 mL Nebulization Q4H  . levofloxacin  750 mg Oral Q48H  . predniSONE  60 mg Oral BID WC  . sodium chloride  3 mL Intravenous Q12H  . sodium chloride  3 mL Intravenous Q12H  . triamcinolone cream   Topical BID   Continuous Infusions: . sodium chloride 7 mL/hr (08/23/14 1255)   Antibiotics Given (last 72 hours)   Date/Time Action Medication Dose   08/25/14 0928 Given   levofloxacin (LEVAQUIN) tablet 750 mg 750 mg      Time spent: 35 min  Delfina Redwood, MD  [facility] Pager [phone]. If 7PM-7AM, please contact night-coverage at [email or web address], password [facility] 08/26/2014, 8:20 AM  LOS: 13 days

## 2014-08-26 NOTE — Progress Notes (Signed)
PT Cancellation Note  Patient Details Name: Douglas Mann MRN: [id number] DOB: 04-22-1946   Cancelled Treatment:    Reason Eval/Treat Not Completed: Patient declined, no reason specified.  Pt declining any mobility at this time stating he is nervous about getting blood today.  Attempted to encourage at least up to chair and pt continues to refuse.  Will try back another time.     Shaylan Tutton, Thornton Papas 08/26/2014, 10:43 AM

## 2014-08-26 NOTE — Progress Notes (Signed)
CSW met with patient and his daughter this a.m.  Also present was a friend of patient.  He gave permission to proceed with the visit while daughter and friend were in his room.  CSW presented option of SNF placement- but firmly but politely declined this stating that he will return to his apartment and that he will not consider any other possibility  Patient relates he has a small, 1 bedroom apartment and that he is able to easily navigate this area.  He states that he has all possible DME due to past hospital stays including home oxygen.  Patient related that his friend (the gentleman in the room) would be staying with him as much as needed and daughter confirmed that she would be supporting and checking on patient throughout his recovery. He is agreeable to Home Health services.  Above information relayed to Debbie Dowell, RNCM who will follow up with patient prior to d/c.  No further CSW needs identified and CSW signing off.   T. , LCSW [phone]    

## 2014-08-27 DIAGNOSIS — I214 Non-ST elevation (NSTEMI) myocardial infarction: Secondary | ICD-10-CM

## 2014-08-27 LAB — BASIC METABOLIC PANEL
Anion gap: 11 (ref 5–15)
BUN: 54 mg/dL — AB (ref 6–23)
CHLORIDE: 99 meq/L (ref 96–112)
CO2: 28 mEq/L (ref 19–32)
Calcium: 8.2 mg/dL — ABNORMAL LOW (ref 8.4–10.5)
Creatinine, Ser: 1.67 mg/dL — ABNORMAL HIGH (ref 0.50–1.35)
GFR calc Af Amer: 46 mL/min — ABNORMAL LOW (ref >90)
GFR calc non Af Amer: 40 mL/min — ABNORMAL LOW (ref >90)
GLUCOSE: 160 mg/dL — AB (ref 70–99)
POTASSIUM: 3.7 meq/L (ref 3.7–5.3)
Sodium: 138 mEq/L (ref 137–147)

## 2014-08-27 LAB — FOLATE RBC: RBC Folate: 1680 ng/mL — ABNORMAL HIGH (ref >280)

## 2014-08-27 LAB — TYPE AND SCREEN
ABO/RH(D): O POS
Antibody Screen: NEGATIVE
UNIT DIVISION: 0

## 2014-08-27 LAB — OCCULT BLOOD X 1 CARD TO LAB, STOOL: FECAL OCCULT BLD: POSITIVE — AB

## 2014-08-27 LAB — GLUCOSE, CAPILLARY
GLUCOSE-CAPILLARY: 136 mg/dL — AB (ref 70–99)
GLUCOSE-CAPILLARY: 173 mg/dL — AB (ref 70–99)
Glucose-Capillary: 163 mg/dL — ABNORMAL HIGH (ref 70–99)
Glucose-Capillary: 139 mg/dL — ABNORMAL HIGH (ref 70–99)

## 2014-08-27 LAB — HEMOGLOBIN AND HEMATOCRIT, BLOOD
HEMATOCRIT: 25.8 % — AB (ref 39.0–52.0)
Hemoglobin: 8.6 g/dL — ABNORMAL LOW (ref 13.0–17.0)

## 2014-08-27 MED ORDER — LEVALBUTEROL HCL 0.63 MG/3ML IN NEBU
0.6300 mg | INHALATION_SOLUTION | RESPIRATORY_TRACT | Status: DC | PRN
  Filled 2014-08-27: qty 3

## 2014-08-27 MED ORDER — PREDNISONE 50 MG PO TABS
60.0000 mg | ORAL_TABLET | Freq: Every day | ORAL | Status: DC
  Administered 2014-08-28: 60 mg via ORAL
  Filled 2014-08-27 (×2): qty 1

## 2014-08-27 MED ORDER — RANOLAZINE ER 500 MG PO TB12
500.0000 mg | ORAL_TABLET | Freq: Two times a day (BID) | ORAL | Status: DC
  Administered 2014-08-27 – 2014-09-03 (×15): 500 mg via ORAL
  Filled 2014-08-27 (×16): qty 1

## 2014-08-27 MED ORDER — IPRATROPIUM BROMIDE 0.02 % IN SOLN
0.5000 mg | Freq: Three times a day (TID) | RESPIRATORY_TRACT | Status: DC
  Administered 2014-08-27 – 2014-08-30 (×10): 0.5 mg via RESPIRATORY_TRACT
  Filled 2014-08-27 (×10): qty 2.5

## 2014-08-27 MED ORDER — GUAIFENESIN ER 600 MG PO TB12
600.0000 mg | ORAL_TABLET | Freq: Two times a day (BID) | ORAL | Status: DC | PRN
  Administered 2014-09-01: 600 mg via ORAL
  Filled 2014-08-27: qty 1

## 2014-08-27 MED ORDER — POTASSIUM CHLORIDE CRYS ER 20 MEQ PO TBCR
40.0000 meq | EXTENDED_RELEASE_TABLET | Freq: Two times a day (BID) | ORAL | Status: AC
  Administered 2014-08-27 (×2): 40 meq via ORAL
  Filled 2014-08-27 (×2): qty 2

## 2014-08-27 MED ORDER — LEVALBUTEROL HCL 0.63 MG/3ML IN NEBU
0.6300 mg | INHALATION_SOLUTION | Freq: Three times a day (TID) | RESPIRATORY_TRACT | Status: DC
  Administered 2014-08-27 – 2014-08-30 (×10): 0.63 mg via RESPIRATORY_TRACT
  Filled 2014-08-27 (×18): qty 3

## 2014-08-27 NOTE — Progress Notes (Signed)
Patient Douglas Mann      DOB: Mar 09, 1946      CBJ:[id number]   Palliative Medicine Team at [facility] Progress Note    Subjective: Had more ventricular tachycardia last night. Largely asymptomatic. Started on amio infusion.  Feels good this morning. Able to sit in chair. Ate some breakfast. Denies pain, nausea, vomiting, constipation/diarrhea. Looks forward to working with PT this afternoon. He plans for home after hospitalization.  Simethicone helping gas/bloating.     Filed Vitals:   08/27/14 1140  BP: 98/56  Pulse: 94  Temp: 97.5 F (36.4 C)  Resp: 21   Physical exam: GEN: Alert, NAD  HEENT: [place], sclera anicteric  CV: mild tachycardia  LUNGS: some crackles at right base ABD: soft, NT, ND  EXT: no cyanosis, no edema  CBC    Component Value Date/Time   WBC 11.3* 08/26/2014 0410   RBC 2.54* 08/26/2014 0410   HGB 8.6* 08/27/2014 0400   HCT 25.8* 08/27/2014 0400   PLT 277 08/26/2014 0410   MCV 87.4 08/26/2014 0410   MCH 28.0 08/26/2014 0410   MCHC 32.0 08/26/2014 0410   RDW 14.9 08/26/2014 0410   LYMPHSABS 0.4* 08/26/2014 0410   MONOABS 0.5 08/26/2014 0410   EOSABS 0.0 08/26/2014 0410   BASOSABS 0.0 08/26/2014 0410    CMP     Component Value Date/Time   NA 138 08/27/2014 0400   K 3.7 08/27/2014 0400   CL 99 08/27/2014 0400   CO2 28 08/27/2014 0400   GLUCOSE 160* 08/27/2014 0400   BUN 54* 08/27/2014 0400   CREATININE 1.67* 08/27/2014 0400   CALCIUM 8.2* 08/27/2014 0400   PROT 6.2 08/22/2014 1150   ALBUMIN 2.4* 08/22/2014 1150   AST 37 08/22/2014 1150   ALT 27 08/22/2014 1150   ALKPHOS 112 08/22/2014 1150   BILITOT 0.4 08/22/2014 1150   GFRNONAA 40* 08/27/2014 0400   GFRAA 46* 08/27/2014 0400     Assessment and plan: 1. Code Status: DNR   2. Goals of Care:  See initial consultation and previous documentation. Wants to continue to work with PT here to gain strength back and knows he is weak.  He plans on continuing rehab efforts at home after hospital and not willing to go to SNF for rehab.  Wants to do  all that he can to "get better" but does not want rehab stay.  Will continue to follow his clinical trajectory.   3. Symptom Management:  Dyspnea- tolerated switch from IV dilaudid to PRN oxycodone well.  Only 1 dose needed.  Continue. Ongoing medical management Gas/Bloating- PRN simethicone helping  4. Psychosocial/Spiritual: Lives at home with [facility]. Divorced. First wife is deceased (dghtr Vera's mom). Divorced from 2nd wife and has daughter in [place] from that marriage. Close with his friend Al who lives next door to him. Has a sister Violet who has significant health problems as well as a brother who he describes as "dying from lung cancer". 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 Enjoys watching westerns.   Will follow along intermittently. Please contact our team with more immediate questions or concerns.   Doran Clay D.O. Palliative Medicine Team at [facility]  Pager: [phone] Team Phone: [phone]

## 2014-08-27 NOTE — Progress Notes (Signed)
Physical Therapy Treatment Patient Details Name: Douglas Mann MRN: [id number] DOB: [birth year] Today's Date: 08/27/2014    History of Present Illness Pt adm to [facility] 8/25 with GI bleed. Developed severe chest pain 8/27 and found to have NSTEMI and required intubation and transferred to [facility]. Pt extubated on 8/30 and reintubated 8/31. Extubated 9/2. PMH - O2 dependent COPD, HTN    PT Comments    Pt very motivated to regain enough strength to go home. Discussed d/c options related to therapy and pt is not interested in going anywhere but his home. Neighbor, Al, present and can provide 24 hr assist. Activity limited today by incr HR and arrythmias (per MD, non-sustained v-tach). Overall strength and balance better than anticipated. Cardiopulmonary endurance/tolerance for activity remain limiting factor.   Follow Up Recommendations  Home health PT;Supervision/Assistance - 24 hour     Equipment Recommendations  None recommended by PT    Recommendations for Other Services OT consult     Precautions / Restrictions Precautions Precautions: Fall Restrictions Weight Bearing Restrictions: No    Mobility  Bed Mobility Overal bed mobility: Needs Assistance Bed Mobility: Supine to Sit     Supine to sit: Supervision;HOB elevated     General bed mobility comments: HOB 30, pt used rail, however did not need physical assist; incr time and effort  Transfers Overall transfer level: Needs assistance Equipment used: Rolling walker (2 wheeled) Transfers: Sit to/from [facility] Sit to Stand: Min assist Stand pivot transfers: Min assist       General transfer comment: vc for safe use of DME and technique; steady assist  Ambulation/Gait             General Gait Details: deferred due to incr HR, RR   Stairs            Wheelchair Mobility    Modified Rankin (Stroke Patients Only)       Balance   Sitting-balance support: No upper extremity supported;Feet  unsupported Sitting balance-Leahy Scale: Good     Standing balance support: No upper extremity supported Standing balance-Leahy Scale: Fair                      Cognition Arousal/Alertness: Awake/alert Behavior During Therapy: WFL for tasks assessed/performed Overall Cognitive Status: Within Functional Limits for tasks assessed                      Exercises General Exercises - Lower Extremity Ankle Circles/Pumps: AROM;Both;20 reps Long Arc Quad: AROM;Both;5 reps Hip Flexion/Marching: AROM;Both;10 reps;Standing Mini-Sqauts: AROM;Both;10 reps;Standing    General Comments General comments (skin integrity, edema, etc.): Discussed possible inpatient rehab stay and pt has no interest; he only wants to go home; he expressed understanding of PT (and MD) concerns, however continues to insist his friend "Al" will be with him and he'll do best in his own home with HHPT. (Al present and confirmed)      Pertinent Vitals/Pain Pain Assessment: No/denies pain    Home Living                      Prior Function            PT Goals (current goals can now be found in the care plan section) Acute Rehab PT Goals Patient Stated Goal: Return home Progress towards PT goals: Progressing toward goals    Frequency  Min 3X/week    PT Plan Discharge plan needs to be updated  Co-evaluation             End of Session Equipment Utilized During Treatment: Gait belt;Oxygen Activity Tolerance: Patient limited by fatigue Patient left: in chair;with call bell/phone within reach;with family/visitor present;with nursing/sitter in room     Time: 1100-1132 PT Time Calculation (min): 32 min  Charges:  $Therapeutic Exercise: 8-22 mins $Therapeutic Activity: 8-22 mins                    G Codes:      Kiyoto Slomski Sep 26, 2014, 11:48 AM Pager [phone]

## 2014-08-27 NOTE — Progress Notes (Signed)
DAILY PROGRESS NOTE  Subjective:  Feels better - wants to go home. Worked with PT today, he is very weak. He is having NSVT. On amiodarone loading.  Objective:  Temp:  [97.6 F (36.4 C)-98.7 F (37.1 C)] 97.6 F (36.4 C) (09/08 0700) Pulse Rate:  [58-134] 88 (09/08 1000) Resp:  [14-31] 19 (09/08 1000) BP: (82-118)/(41-74) 116/64 mmHg (09/08 1000) SpO2:  [92 %-100 %] 98 % (09/08 1000) Weight:  [169 lb 1.5 oz (76.7 kg)] 169 lb 1.5 oz (76.7 kg) (09/08 0400) Weight change:   Intake/Output from previous day: 09/07 0701 - 09/08 0700 In: 1634.4 [P.O.:880; I.V.:416.9; Blood:337.5] Out: 2060 [Urine:2060]  Intake/Output from this shift: Total I/O In: 273.4 [P.O.:240; I.V.:33.4] Out: 60 [Urine:75]  Medications: Current Facility-Administered Medications  Medication Dose Route Frequency Provider Last Rate Last Dose  . 0.9 %  sodium chloride infusion  250 mL Intravenous PRN Allyne Gee, MD   250 mL at 08/17/14 2030  . 0.9 %  sodium chloride infusion   Intravenous Continuous Brand Males, MD   7 mL/hr at 08/23/14 1255  . albuterol (PROVENTIL) (2.5 MG/3ML) 0.083% nebulizer solution 2.5 mg  2.5 mg Nebulization Q4H PRN Delfina Redwood, MD      . amiodarone (NEXTERONE PREMIX) 360 MG/200ML (1.8 mg/mL) IV infusion  30 mg/hr Intravenous Continuous Dorris Carnes V, MD 16.7 mL/hr at 08/27/14 1043 30 mg/hr at 08/27/14 1043  . amiodarone (PACERONE) tablet 400 mg  400 mg Oral BID Fay Records, MD   400 mg at 08/27/14 0951  . aspirin chewable tablet 81 mg  81 mg Oral Daily Wilhelmina Mcardle, MD   81 mg at 08/27/14 0951  . atorvastatin (LIPITOR) tablet 80 mg  80 mg Oral q1800 Wilhelmina Mcardle, MD   80 mg at 08/26/14 1712  . budesonide (PULMICORT) nebulizer solution 0.5 mg  0.5 mg Nebulization BID Chesley Mires, MD   0.5 mg at 08/27/14 0731  . captopril (CAPOTEN) tablet 6.25 mg  6.25 mg Oral TID Thompson Grayer, MD   6.25 mg at 08/27/14 0951  . clopidogrel (PLAVIX) tablet 75 mg  75 mg Oral Daily Wilhelmina Mcardle, MD   75 mg at 08/27/14 1937  . docusate sodium (COLACE) capsule 100 mg  100 mg Oral BID Delfina Redwood, MD   100 mg at 08/27/14 0951  . feeding supplement (RESOURCE BREEZE) (RESOURCE BREEZE) liquid 1 Container  1 Container Oral TID BM Heather Cornelison Pitts, RD   1 Container at 08/27/14 1000  . fluticasone (FLONASE) 50 MCG/ACT nasal spray 2 spray  2 spray Each Nare Daily Geradine Girt, DO   2 spray at 08/27/14 0950  . furosemide (LASIX) injection 80 mg  80 mg Intravenous Daily Thompson Grayer, MD   80 mg at 08/27/14 0951  . guaiFENesin (MUCINEX) 12 hr tablet 600 mg  600 mg Oral BID Brock Ra Lampkin, DO   600 mg at 08/27/14 0951  . hydrALAZINE (APRESOLINE) tablet 10 mg  10 mg Oral 3 times per day Thompson Grayer, MD   10 mg at 08/27/14 0529  . hydrocortisone (ANUSOL-HC) 2.5 % rectal cream   Rectal BID Allyne Gee, MD      . insulin aspart (novoLOG) injection 0-15 Units  0-15 Units Subcutaneous TID WC Jeryl Columbia, NP   2 Units at 08/27/14 [phone]  . ipratropium-albuterol (DUONEB) 0.5-2.5 (3) MG/3ML nebulizer solution 3 mL  3 mL Nebulization TID Delfina Redwood, MD   3  mL at 08/27/14 0731  . levofloxacin (LEVAQUIN) tablet 750 mg  750 mg Oral Q48H Manley Mason, RPH   750 mg at 08/27/14 0951  . LORazepam (ATIVAN) injection 1 mg  1 mg Intravenous Q4H PRN Geradine Girt, DO   1 mg at 08/26/14 0030  . oxyCODONE (Oxy IR/ROXICODONE) immediate release tablet 5 mg  5 mg Oral Q4H PRN Brock Ra Lampkin, DO   5 mg at 08/26/14 2157  . predniSONE (DELTASONE) tablet 60 mg  60 mg Oral BID WC Delfina Redwood, MD   60 mg at 08/27/14 0807  . senna (SENOKOT) tablet 17.2 mg  2 tablet Oral Daily PRN Delfina Redwood, MD      . simethicone (MYLICON) 40 MV/7.8IO suspension 40 mg  40 mg Oral QID PRN Brock Ra Lampkin, DO   40 mg at 08/27/14 0803  . sodium chloride 0.9 % injection 10-40 mL  10-40 mL Intracatheter PRN Geradine Girt, DO   10 mL at 08/25/14 0935  . sodium chloride 0.9 % injection 3  mL  3 mL Intravenous Q12H Allyne Gee, MD   3 mL at 08/25/14 2215  . sodium chloride 0.9 % injection 3 mL  3 mL Intravenous Q12H Allyne Gee, MD   3 mL at 08/27/14 0952  . triamcinolone cream (KENALOG) 0.1 %   Topical BID Allyne Gee, MD   1 application at 96/29/52 2203    Physical Exam: General appearance: alert and no distress Neck: no carotid bruit and no JVD Lungs: clear to auscultation bilaterally Heart: regular rate and rhythm and occasional irregularity Abdomen: soft, non-tender; bowel sounds normal; no masses,  no organomegaly Extremities: extremities normal, atraumatic, no cyanosis or edema Pulses: 2+ and symmetric Skin: Skin color, texture, turgor normal. No rashes or lesions Neurologic: Grossly normal Psych: Pleasant  Lab Results: Results for orders placed during the hospital encounter of 08/13/14 (from the past 48 hour(s))  GLUCOSE, CAPILLARY     Status: Abnormal   Collection Time    08/25/14 12:23 PM      Result Value Ref Range   Glucose-Capillary 179 (*) 70 - 99 mg/dL  GLUCOSE, CAPILLARY     Status: Abnormal   Collection Time    08/25/14  4:20 PM      Result Value Ref Range   Glucose-Capillary 180 (*) 70 - 99 mg/dL  GLUCOSE, CAPILLARY     Status: Abnormal   Collection Time    08/25/14  8:09 PM      Result Value Ref Range   Glucose-Capillary 175 (*) 70 - 99 mg/dL  MAGNESIUM     Status: None   Collection Time    08/25/14 11:04 PM      Result Value Ref Range   Magnesium 2.3  1.5 - 2.5 mg/dL  BASIC METABOLIC PANEL     Status: Abnormal   Collection Time    08/26/14  4:10 AM      Result Value Ref Range   Sodium 138  137 - 147 mEq/L   Potassium 3.9  3.7 - 5.3 mEq/L   Chloride 100  96 - 112 mEq/L   CO2 26  19 - 32 mEq/L   Glucose, Bld 173 (*) 70 - 99 mg/dL   BUN 56 (*) 6 - 23 mg/dL   Creatinine, Ser 1.92 (*) 0.50 - 1.35 mg/dL   Calcium 8.3 (*) 8.4 - 10.5 mg/dL   GFR calc non Af Amer 34 (*) >90 mL/min  GFR calc Af Amer 39 (*) >90 mL/min   Comment:  (NOTE)     The eGFR has been calculated using the CKD EPI equation.     This calculation has not been validated in all clinical situations.     eGFR's persistently <90 mL/min signify possible Chronic Kidney     Disease.   Anion gap 12  5 - 15  CBC WITH DIFFERENTIAL     Status: Abnormal   Collection Time    08/26/14  4:10 AM      Result Value Ref Range   WBC 11.3 (*) 4.0 - 10.5 K/uL   RBC 2.54 (*) 4.22 - 5.81 MIL/uL   Hemoglobin 7.1 (*) 13.0 - 17.0 g/dL   HCT 22.2 (*) 39.0 - 52.0 %   MCV 87.4  78.0 - 100.0 fL   MCH 28.0  26.0 - 34.0 pg   MCHC 32.0  30.0 - 36.0 g/dL   RDW 14.9  11.5 - 15.5 %   Platelets 277  150 - 400 K/uL   Neutrophils Relative % 92 (*) 43 - 77 %   Neutro Abs 10.4 (*) 1.7 - 7.7 K/uL   Lymphocytes Relative 3 (*) 12 - 46 %   Lymphs Abs 0.4 (*) 0.7 - 4.0 K/uL   Monocytes Relative 5  3 - 12 %   Monocytes Absolute 0.5  0.1 - 1.0 K/uL   Eosinophils Relative 0  0 - 5 %   Eosinophils Absolute 0.0  0.0 - 0.7 K/uL   Basophils Relative 0  0 - 1 %   Basophils Absolute 0.0  0.0 - 0.1 K/uL  GLUCOSE, CAPILLARY     Status: Abnormal   Collection Time    08/26/14  8:05 AM      Result Value Ref Range   Glucose-Capillary 162 (*) 70 - 99 mg/dL  IRON AND TIBC     Status: Abnormal   Collection Time    08/26/14  9:00 AM      Result Value Ref Range   Iron 53  42 - 135 ug/dL   TIBC 310  215 - 435 ug/dL   Saturation Ratios 17 (*) 20 - 55 %   UIBC 257  125 - 400 ug/dL   Comment: Performed at [place]     Status: None   Collection Time    08/26/14  9:00 AM      Result Value Ref Range   Ferritin 136  22 - 322 ng/mL   Comment: Performed at [facility]  VITAMIN B12     Status: Abnormal   Collection Time    08/26/14  9:00 AM      Result Value Ref Range   Vitamin B-12 1034 (*) 211 - 911 pg/mL   Comment: Performed at [place]     Status: None   Collection Time    08/26/14 11:39 AM      Result Value Ref Range    ABO/RH(D) O POS     Antibody Screen NEG     Sample Expiration 08/29/2014     Unit Number [id number]     Blood Component Type RED CELLS,LR     Unit division 00     Status of Unit ISSUED,FINAL     Transfusion Status OK TO TRANSFUSE     Crossmatch Result Compatible    PREPARE RBC (CROSSMATCH)     Status: None   Collection Time    08/26/14  11:39 AM      Result Value Ref Range   Order Confirmation ORDER PROCESSED BY BLOOD BANK    GLUCOSE, CAPILLARY     Status: Abnormal   Collection Time    08/26/14 12:18 PM      Result Value Ref Range   Glucose-Capillary 143 (*) 70 - 99 mg/dL  GLUCOSE, CAPILLARY     Status: Abnormal   Collection Time    08/26/14  4:33 PM      Result Value Ref Range   Glucose-Capillary 166 (*) 70 - 99 mg/dL  GLUCOSE, CAPILLARY     Status: Abnormal   Collection Time    08/26/14  9:24 PM      Result Value Ref Range   Glucose-Capillary 204 (*) 70 - 99 mg/dL  BASIC METABOLIC PANEL     Status: Abnormal   Collection Time    08/27/14  4:00 AM      Result Value Ref Range   Sodium 138  137 - 147 mEq/L   Potassium 3.7  3.7 - 5.3 mEq/L   Chloride 99  96 - 112 mEq/L   CO2 28  19 - 32 mEq/L   Glucose, Bld 160 (*) 70 - 99 mg/dL   BUN 54 (*) 6 - 23 mg/dL   Creatinine, Ser 1.67 (*) 0.50 - 1.35 mg/dL   Calcium 8.2 (*) 8.4 - 10.5 mg/dL   GFR calc non Af Amer 40 (*) >90 mL/min   GFR calc Af Amer 46 (*) >90 mL/min   Comment: (NOTE)     The eGFR has been calculated using the CKD EPI equation.     This calculation has not been validated in all clinical situations.     eGFR's persistently <90 mL/min signify possible Chronic Kidney     Disease.   Anion gap 11  5 - 15  HEMOGLOBIN AND HEMATOCRIT, BLOOD     Status: Abnormal   Collection Time    08/27/14  4:00 AM      Result Value Ref Range   Hemoglobin 8.6 (*) 13.0 - 17.0 g/dL   Comment: POST TRANSFUSION SPECIMEN   HCT 25.8 (*) 39.0 - 52.0 %  OCCULT BLOOD X 1 CARD TO LAB, STOOL     Status: Abnormal   Collection Time     08/27/14  5:28 AM      Result Value Ref Range   Fecal Occult Bld POSITIVE (*) NEGATIVE  GLUCOSE, CAPILLARY     Status: Abnormal   Collection Time    08/27/14  8:05 AM      Result Value Ref Range   Glucose-Capillary 136 (*) 70 - 99 mg/dL    Imaging: No results found.  Assessment:  1. Principal Problem: 2.   MI, acute, non ST segment elevation 3. Active Problems: 4.   HYPERLIPIDEMIA 5.   HYPERTENSION 6.   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   GI bleed 9.   Acute pulmonary edema 10.   Acute respiratory failure with hypoxia 11.   Cardiogenic shock: Following nonSTEMI 12.   Atherosclerotic heart disease of native coronary artery with unstable angina pectoris: Chronic percent RCA with left to right collaterals; 90% proximal LAD. 13.   Acute combined systolic and diastolic HF (heart failure), NYHA class 4: In setting of non-STEMI; LVEDP 45 mmHg 14.   Cardiomyopathy, ischemic: Severe. EF 5 -10%  by LV gram - following non-STEMI and ventricular tachycardia 15.   AKI (acute kidney injury) 16.   Anemia, unspecified 17.   Plan:  1. Still having NSVT - will push up Mg >2, K >4.5, continue IV amiodarone load then po amiodarone 400 mg BID. Can likely get peripheral access today and remove central line. Discussed rehab options with PT in the room, he wants to go home - not interested in CIR - ?maybe home PT 3x weekly. Keep in stepdown until NSVT has resolved. Add ranexa 500 mg BID.  Time Spent Directly with Patient: 15 minutes  Length of Stay:  LOS: 14 days   Pixie Casino, MD, [facility] Attending Cardiologist [facility]  HILTY,Kenneth C 08/27/2014, 11:22 AM

## 2014-08-27 NOTE — Progress Notes (Signed)
PROGRESS NOTE  Douglas Mann NWG:[id number] DOB: 1945/12/22 DOA: 08/13/2014 PCP: [name], MD  68 year old male with 42 pack active smoker, full code with known CAD and s/p stent at [facility] > 10 years ago and copd with o2 dependency Admitted 08/13/14  with  a chief complaint of bleeding in his bowels and a swollen abdomen along with a story that sounded like mesentric angina (post prandial abd discmfrt and weight Loss and ability to handle only small amounts of food at at time). At admission he ruled out for MI. Then on evening of 08/15/14 he develped sudden onset chest pain, with sinus tachycardia with EKG change to duffise ST depression in inferior and lateral leads and respiratory distress with CXR suggestive of flash pulmonary edema. Despite lasix he developed 2 x episodes of V Tach with normal BP and needing IV amio (never needed cpr). Patient emergently intubated on [place] floor and moved to ICU at [facility].   8/30 he was extubated  Then on 8/31 early AM: re-intubated. Edema pattern on CXR. Milrinone initiated by Cardiology  9/01 Extubated. Remained slightly encephalopathic due to residual sedating medications. Discussed re-intubation status with pt (who did not seem to fully grasp the conversation) and his daughter who feels certain that he would not wish to undergo repeat intubation or ACLS in this situation where there are very limited treatment options and where recurrent respiratory fialure would be indicative of failure of those few options  Assessment/Plan: Acute on chronic systolic heart failure-  Per cardiology  Severe oxygen requiring COPD - wears O2 at home and still smokes Change albuterol to xopenex due to arrhythmias. No wheeze today. Taper prednisone  Status post salvage LAD stent in the setting of cardiogenic shock and pulmonary edema   Ventricular tachycardia, on amiodarone gtt. Transfer out of SDU cancelled  AKI Creatinine stable  Chronic intermittent abd pain, concern for  mesenteric ischemia  Hematochezia, resolved Consider CTA abdomen/pelvis when clinically stable per GI recs No pain after eating  Anemia secondary to blood loss and critical illness Appropriate hgb rise after 1 unit pRBC. Not stable for colonoscopy at this time. Anemia panel ok.  Hyperglycemia SSI  Code Status: DNR Family Communication: friend at bedside Disposition Plan: refuses SNF   Consultants:  PCCM  Cards  pallaitive care  Procedures:  cath   HPI/Subjective: Feels weak. No dyspnea or CP. No bleeding  Objective: Filed Vitals:   08/27/14 1140  BP: 98/56  Pulse: 94  Temp: 97.5 F (36.4 C)  Resp: 21    Intake/Output Summary (Last 24 hours) at 08/27/14 1300 Last data filed at 08/27/14 1100  Gross per 24 hour  Intake 1924.5 ml  Output   1885 ml  Net   39.5 ml   Filed Weights   08/21/14 0800 08/23/14 1200 08/27/14 0400  Weight: 80 kg (176 lb 5.9 oz) 75 kg (165 lb 5.5 oz) 76.7 kg (169 lb 1.5 oz)    Exam:   General:  In chair  Cardiovascular: rrr without MGR  Respiratory: CTA without WRR  Abdomen: +BS, soft, NT, ND  Ext: SCDs. No CCE  Data Reviewed: Basic Metabolic Panel:  Recent Labs Lab 08/23/14 0523 08/23/14 1840 08/24/14 0455 08/24/14 1823 08/25/14 0400 08/25/14 2304 08/26/14 0410 08/27/14 0400  NA 136* 137 139 138 138  --  138 138  K 3.9 4.0 4.0 3.5* 4.0  --  3.9 3.7  CL 99 98 101 99 101  --  100 99  CO2 26 26  27 24 26   --  26 28  GLUCOSE 154* 148* 150* 184* 186*  --  173* 160*  BUN 29* 38* 41* 49* 52*  --  56* 54*  CREATININE 1.75* 2.17* 2.01* 1.95* 1.89*  --  1.92* 1.67*  CALCIUM 8.5 8.2* 8.2* 8.2* 8.2*  --  8.3* 8.2*  MG 2.3 2.5 2.4 2.3 2.4 2.3  --   --   PHOS 2.6  --  4.3  --  3.0  --   --   --    Liver Function Tests:  Recent Labs Lab 08/22/14 1150  AST 37  ALT 27  ALKPHOS 112  BILITOT 0.4  PROT 6.2  ALBUMIN 2.4*   No results found for this basename: LIPASE, AMYLASE,  in the last 168 hours No results found  for this basename: AMMONIA,  in the last 168 hours CBC:  Recent Labs Lab 08/22/14 1150 08/23/14 0523 08/24/14 0455 08/25/14 0400 08/26/14 0410 08/27/14 0400  WBC 11.5* 11.7* 11.5* 11.2* 11.3*  --   NEUTROABS  --  10.5* 10.4* 10.3* 10.4*  --   HGB 8.2* 7.9* 7.7* 7.3* 7.1* 8.6*  HCT 24.9* 23.7* 23.4* 22.0* 22.2* 25.8*  MCV 88.0 88.4 90.3 89.8 87.4  --   PLT 189 202 225 252 277  --    Cardiac Enzymes: No results found for this basename: CKTOTAL, CKMB, CKMBINDEX, TROPONINI,  in the last 168 hours BNP (last 3 results)  Recent Labs  08/15/14 1956 08/20/14 0500 08/22/14 1150  PROBNP 2319.0* 51736.0* 43395.0*   CBG:  Recent Labs Lab 08/26/14 1218 08/26/14 1633 08/26/14 2124 08/27/14 0805 08/27/14 1143  GLUCAP 143* 166* 204* 136* 173*    Recent Results (from the past 240 hour(s))  CLOSTRIDIUM DIFFICILE BY PCR     Status: None   Collection Time    08/20/14  5:06 PM      Result Value Ref Range Status   C difficile by pcr NEGATIVE  NEGATIVE Final     Studies: No results found.  Scheduled Meds: . amiodarone  400 mg Oral BID  . aspirin  81 mg Oral Daily  . atorvastatin  80 mg Oral q1800  . budesonide (PULMICORT) nebulizer solution  0.5 mg Nebulization BID  . captopril  6.25 mg Oral TID  . clopidogrel  75 mg Oral Daily  . docusate sodium  100 mg Oral BID  . feeding supplement (RESOURCE BREEZE)  1 Container Oral TID BM  . fluticasone  2 spray Each Nare Daily  . furosemide  80 mg Intravenous Daily  . guaiFENesin  600 mg Oral BID  . hydrALAZINE  10 mg Oral 3 times per day  . hydrocortisone   Rectal BID  . insulin aspart  0-15 Units Subcutaneous TID WC  . ipratropium  0.5 mg Nebulization TID  . levalbuterol  0.63 mg Nebulization TID  . levofloxacin  750 mg Oral Q48H  . potassium chloride  40 mEq Oral BID  . [START ON 08/28/2014] predniSONE  60 mg Oral Q breakfast  . ranolazine  500 mg Oral BID  . sodium chloride  3 mL Intravenous Q12H  . sodium chloride  3 mL  Intravenous Q12H  . triamcinolone cream   Topical BID   Continuous Infusions: . sodium chloride Stopped (08/26/14 1900)  . amiodarone 30 mg/hr (08/27/14 1200)   Antibiotics Given (last 72 hours)   Date/Time Action Medication Dose   08/25/14 0928 Given   levofloxacin (LEVAQUIN) tablet 750 mg 750 mg  08/27/14 0951 Given   levofloxacin (LEVAQUIN) tablet 750 mg 750 mg      Time spent: 35 min  Delfina Redwood, MD  [facility] Pager [phone]. If 7PM-7AM, please contact night-coverage at [email or web address], password [facility] 08/27/2014, 1:00 PM  LOS: 14 days

## 2014-08-27 NOTE — Progress Notes (Signed)
08/27/2014 1320  Dr. Conley Canal aware of bllod with stool. Douglas Mann, Carolynn Comment

## 2014-08-28 LAB — BASIC METABOLIC PANEL
Anion gap: 9 (ref 5–15)
BUN: 48 mg/dL — ABNORMAL HIGH (ref 6–23)
CHLORIDE: 103 meq/L (ref 96–112)
CO2: 28 mEq/L (ref 19–32)
Calcium: 7.9 mg/dL — ABNORMAL LOW (ref 8.4–10.5)
Creatinine, Ser: 1.57 mg/dL — ABNORMAL HIGH (ref 0.50–1.35)
GFR, EST AFRICAN AMERICAN: 50 mL/min — AB (ref >90)
GFR, EST NON AFRICAN AMERICAN: 43 mL/min — AB (ref >90)
GLUCOSE: 115 mg/dL — AB (ref 70–99)
POTASSIUM: 4.1 meq/L (ref 3.7–5.3)
Sodium: 140 mEq/L (ref 137–147)

## 2014-08-28 LAB — GLUCOSE, CAPILLARY
Glucose-Capillary: 102 mg/dL — ABNORMAL HIGH (ref 70–99)
Glucose-Capillary: 112 mg/dL — ABNORMAL HIGH (ref 70–99)
Glucose-Capillary: 140 mg/dL — ABNORMAL HIGH (ref 70–99)
Glucose-Capillary: 151 mg/dL — ABNORMAL HIGH (ref 70–99)

## 2014-08-28 LAB — HEMOGLOBIN AND HEMATOCRIT, BLOOD
HEMATOCRIT: 27.7 % — AB (ref 39.0–52.0)
Hemoglobin: 9.2 g/dL — ABNORMAL LOW (ref 13.0–17.0)

## 2014-08-28 MED ORDER — POTASSIUM CHLORIDE CRYS ER 20 MEQ PO TBCR
40.0000 meq | EXTENDED_RELEASE_TABLET | Freq: Once | ORAL | Status: AC
  Administered 2014-08-28: 40 meq via ORAL
  Filled 2014-08-28: qty 4

## 2014-08-28 MED ORDER — LISINOPRIL 10 MG PO TABS
10.0000 mg | ORAL_TABLET | Freq: Every day | ORAL | Status: DC
  Administered 2014-08-28 – 2014-09-01 (×5): 10 mg via ORAL
  Filled 2014-08-28 (×6): qty 1

## 2014-08-28 MED ORDER — PREDNISONE 50 MG PO TABS
50.0000 mg | ORAL_TABLET | Freq: Every day | ORAL | Status: DC
  Administered 2014-08-29: 50 mg via ORAL
  Filled 2014-08-28 (×2): qty 1

## 2014-08-28 MED ORDER — FUROSEMIDE 80 MG PO TABS
80.0000 mg | ORAL_TABLET | Freq: Two times a day (BID) | ORAL | Status: DC
  Administered 2014-08-28 – 2014-08-30 (×4): 80 mg via ORAL
  Filled 2014-08-28 (×6): qty 1

## 2014-08-28 NOTE — Progress Notes (Signed)
NUTRITION FOLLOW-UP  INTERVENTION:  Discontinue Resource Breeze po TID, each supplement provides 250 kcal and 9 grams of protein  Provide Magic cup TID with meals, each supplement provides 290 kcal and 9 grams of protein   Encouraged PO intake  Will continue to monitor   NUTRITION DIAGNOSIS: Inadequate oral intake now related to decreased appetite and altered gi function as evidenced by meal completion <50%., ongoing  Goal: Patient to meet >90% estimated needs with meals and supplements; not met.   Monitor:  PO intake, labs, weight trends   ASSESSMENT: Patient presented with chest pain and lower GI bleed. Pt has severe COPD.  Reports that his appetite is poor, hasn't been able to taste anything for the past 2-3 years since his last MI. Pt extubated 9/1 and started on diet 9/2.   PO intake: 25% this AM. Pt reports feeling bloated and he is not eating a lot. He states that he eats his desserts first and is craving watermelon for every meal.   When asked if he is drinking his [employer], he says "sometimes", stating that since his taste is improved it tastes too sweet. I offered to send him magic cups with meals and he said he would prefer that.  Weight has been stable.    Height: Ht Readings from Last 1 Encounters:  08/15/14 _0  (1.702 m)    Weight: Wt Readings from Last 1 Encounters:  08/28/14 167 lb 8.8 oz (76 kg)    BMI:  Body mass index is 26.24 kg/(m^2).  Estimated Nutritional Needs: Kcal: 1900-2100 Protein: 85-95 grams Fluid: >1.9 L/day  Skin: ecchymosis  Diet Order: Dysphagia   Intake/Output Summary (Last 24 hours) at 08/28/14 0953 Last data filed at 08/28/14 0800  Gross per 24 hour  Intake 1435.55 ml  Output   1300 ml  Net 135.55 ml    Last BM: 9/8  Labs:   Recent Labs Lab 08/23/14 0523  08/24/14 0455 08/24/14 1823 08/25/14 0400 08/25/14 2304 08/26/14 0410 08/27/14 0400 08/28/14 0315  NA 136*  < > 139 138 138  --  138 138 140   K 3.9  < > 4.0 3.5* 4.0  --  3.9 3.7 4.1  CL 99  < > 101 99 101  --  100 99 103  CO2 26  < > _1 --  _2 BUN 29*  < > 41* 49* 52*  --  56* 54* 48*  CREATININE 1.75*  < > 2.01* 1.95* 1.89*  --  1.92* 1.67* 1.57*  CALCIUM 8.5  < > 8.2* 8.2* 8.2*  --  8.3* 8.2* 7.9*  MG 2.3  < > 2.4 2.3 2.4 2.3  --   --   --   PHOS 2.6  --  4.3  --  3.0  --   --   --   --   GLUCOSE 154*  < > 150* 184* 186*  --  173* 160* 115*  < > = values in this interval not displayed.  CBG (last 3)   Recent Labs  08/27/14 1635 08/27/14 2119 08/28/14 0819  GLUCAP 163* 139* 102*    Scheduled Meds: . amiodarone  400 mg Oral BID  . aspirin  81 mg Oral Daily  . atorvastatin  80 mg Oral q1800  . budesonide (PULMICORT) nebulizer solution  0.5 mg Nebulization BID  . captopril  6.25 mg Oral TID  . clopidogrel  75 mg Oral Daily  . docusate sodium  100 mg Oral BID  . feeding supplement (RESOURCE BREEZE)  1 Container Oral TID BM  . fluticasone  2 spray Each Nare Daily  . furosemide  80 mg Intravenous Daily  . hydrALAZINE  10 mg Oral 3 times per day  . hydrocortisone   Rectal BID  . insulin aspart  0-15 Units Subcutaneous TID WC  . ipratropium  0.5 mg Nebulization TID  . levalbuterol  0.63 mg Nebulization TID  . levofloxacin  750 mg Oral Q48H  . predniSONE  60 mg Oral Q breakfast  . ranolazine  500 mg Oral BID  . sodium chloride  3 mL Intravenous Q12H  . sodium chloride  3 mL Intravenous Q12H  . triamcinolone cream   Topical BID    Continuous Infusions: . sodium chloride Stopped (08/26/14 1900)  . amiodarone Stopped (08/28/14 0130)    Clayton Bibles, MS, PLDN Provisionally Licensed Dietitian Nutritionist Pager: [phone]

## 2014-08-28 NOTE — Progress Notes (Signed)
Note/chart reviewed. Agree with note.   Marybel Alcott RD, LDN, CNSC [phone] Pager [phone] After Hours Pager   

## 2014-08-28 NOTE — Progress Notes (Signed)
PT Cancellation Note  Patient Details Name: Douglas Mann MRN: [id number] DOB: 01-26-1946   Cancelled Treatment:    Reason Eval/Treat Not Completed: Patient declined, no reason specified.  PT deferred treatment to afternoon due to cardiac rehab seeing pt in AM. Pt states he has already done his therapy for the day and does not wish to get up again. PT offered max encouragement to participate and offered many different treatment options for pt to choose from, however he continued to decline. Pt states he will work with therapy tomorrow. Will continue to follow.    Jolyn Lent 08/28/2014, 3:57 PM  Jolyn Lent, PT, DPT Acute Rehabilitation Services Pager: [phone]

## 2014-08-28 NOTE — Progress Notes (Signed)
DAILY PROGRESS NOTE  Subjective:  Ectopy improved today. Now on po amiodarone and ranexa 500 mg BID. Very weak, not ambulating well.  Objective:  Temp:  [97.5 F (36.4 C)-98.3 F (36.8 C)] 98 F (36.7 C) (09/09 0700) Pulse Rate:  [71-101] 80 (09/09 1100) Resp:  [11-25] 16 (09/09 1100) BP: (98-158)/(42-65) 127/54 mmHg (09/09 1100) SpO2:  [92 %-100 %] 98 % (09/09 1100) Weight:  [167 lb 8.8 oz (76 kg)] 167 lb 8.8 oz (76 kg) (09/09 0300) Weight change: -1 lb 8.7 oz (-0.7 kg)  Intake/Output from previous day: 09/08 0701 - 09/09 0700 In: 1589 [P.O.:1280; I.V.:309] Out: 1675 [Urine:1675]  Intake/Output from this shift: Total I/O In: 120 [P.O.:120] Out: 400 [Urine:400]  Medications: Current Facility-Administered Medications  Medication Dose Route Frequency Provider Last Rate Last Dose  . 0.9 %  sodium chloride infusion  250 mL Intravenous PRN Allyne Gee, MD   250 mL at 08/17/14 2030  . 0.9 %  sodium chloride infusion   Intravenous Continuous Brand Males, MD   7 mL/hr at 08/23/14 1255  . amiodarone (NEXTERONE PREMIX) 360 MG/200ML (1.8 mg/mL) IV infusion  30 mg/hr Intravenous Continuous Fay Records, MD   30 mg/hr at 08/27/14 1900  . amiodarone (PACERONE) tablet 400 mg  400 mg Oral BID Fay Records, MD   400 mg at 08/28/14 0923  . aspirin chewable tablet 81 mg  81 mg Oral Daily Wilhelmina Mcardle, MD   81 mg at 08/28/14 8875  . atorvastatin (LIPITOR) tablet 80 mg  80 mg Oral q1800 Wilhelmina Mcardle, MD   80 mg at 08/27/14 1620  . budesonide (PULMICORT) nebulizer solution 0.5 mg  0.5 mg Nebulization BID Chesley Mires, MD   0.5 mg at 08/28/14 0747  . captopril (CAPOTEN) tablet 6.25 mg  6.25 mg Oral TID Thompson Grayer, MD   6.25 mg at 08/28/14 0923  . clopidogrel (PLAVIX) tablet 75 mg  75 mg Oral Daily Wilhelmina Mcardle, MD   75 mg at 08/28/14 0745  . docusate sodium (COLACE) capsule 100 mg  100 mg Oral BID Delfina Redwood, MD   100 mg at 08/28/14 7972  . fluticasone (FLONASE) 50  MCG/ACT nasal spray 2 spray  2 spray Each Nare Daily Geradine Girt, DO   2 spray at 08/28/14 0923  . furosemide (LASIX) injection 80 mg  80 mg Intravenous Daily Thompson Grayer, MD   80 mg at 08/28/14 0923  . guaiFENesin (MUCINEX) 12 hr tablet 600 mg  600 mg Oral BID PRN Delfina Redwood, MD      . hydrALAZINE (APRESOLINE) tablet 10 mg  10 mg Oral 3 times per day Thompson Grayer, MD   10 mg at 08/28/14 0552  . hydrocortisone (ANUSOL-HC) 2.5 % rectal cream   Rectal BID Allyne Gee, MD   1 application at 82/[birth year]/01 [id number]  . insulin aspart (novoLOG) injection 0-15 Units  0-15 Units Subcutaneous TID WC Jeryl Columbia, NP   3 Units at 08/27/14 1658  . ipratropium (ATROVENT) nebulizer solution 0.5 mg  0.5 mg Nebulization TID Delfina Redwood, MD   0.5 mg at 08/28/14 0747  . levalbuterol (XOPENEX) nebulizer solution 0.63 mg  0.63 mg Nebulization TID Delfina Redwood, MD   0.63 mg at 08/28/14 0747  . levalbuterol (XOPENEX) nebulizer solution 0.63 mg  0.63 mg Nebulization Q2H PRN Delfina Redwood, MD      . levofloxacin [facility]) tablet 750 mg  750 mg  Oral Q48H Manley Mason, RPH   750 mg at 08/27/14 0951  . LORazepam (ATIVAN) injection 1 mg  1 mg Intravenous Q4H PRN Geradine Girt, DO   1 mg at 08/26/14 0030  . oxyCODONE (Oxy IR/ROXICODONE) immediate release tablet 5 mg  5 mg Oral Q4H PRN Brock Ra Lampkin, DO   5 mg at 08/27/14 2307  . potassium chloride SA (K-DUR,KLOR-CON) CR tablet 40 mEq  40 mEq Oral Once Delfina Redwood, MD      . predniSONE (DELTASONE) tablet 60 mg  60 mg Oral Q breakfast Delfina Redwood, MD   60 mg at 08/28/14 0745  . ranolazine (RANEXA) 12 hr tablet 500 mg  500 mg Oral BID Pixie Casino, MD   500 mg at 08/28/14 7169  . senna (SENOKOT) tablet 17.2 mg  2 tablet Oral Daily PRN Delfina Redwood, MD      . simethicone (MYLICON) 40 CV/8.9FY suspension 40 mg  40 mg Oral QID PRN Brock Ra Lampkin, DO   40 mg at 08/28/14 0745  . sodium chloride 0.9 % injection 10-40 mL   10-40 mL Intracatheter PRN Geradine Girt, DO   10 mL at 08/25/14 0935  . sodium chloride 0.9 % injection 3 mL  3 mL Intravenous Q12H Allyne Gee, MD   3 mL at 08/25/14 2215  . sodium chloride 0.9 % injection 3 mL  3 mL Intravenous Q12H Allyne Gee, MD   3 mL at 08/28/14 0924  . triamcinolone cream (KENALOG) 0.1 %   Topical BID Allyne Gee, MD   1 application at 10/05/50 0258    Physical Exam: General appearance: alert and no distress Neck: no carotid bruit and no JVD Lungs: clear to auscultation bilaterally Heart: regular rate and rhythm and occasional irregularity Abdomen: soft, non-tender; bowel sounds normal; no masses,  no organomegaly Extremities: extremities normal, atraumatic, no cyanosis or edema Pulses: 2+ and symmetric Skin: Skin color, texture, turgor normal. No rashes or lesions Neurologic: Grossly normal Psych: Pleasant  Lab Results: Results for orders placed during the hospital encounter of 08/13/14 (from the past 48 hour(s))  TYPE AND SCREEN     Status: None   Collection Time    08/26/14 11:39 AM      Result Value Ref Range   ABO/RH(D) O POS     Antibody Screen NEG     Sample Expiration 08/29/2014     Unit Number [id number]     Blood Component Type RED CELLS,LR     Unit division 00     Status of Unit ISSUED,FINAL     Transfusion Status OK TO TRANSFUSE     Crossmatch Result Compatible    PREPARE RBC (CROSSMATCH)     Status: None   Collection Time    08/26/14 11:39 AM      Result Value Ref Range   Order Confirmation ORDER PROCESSED BY BLOOD BANK    GLUCOSE, CAPILLARY     Status: Abnormal   Collection Time    08/26/14 12:18 PM      Result Value Ref Range   Glucose-Capillary 143 (*) 70 - 99 mg/dL  GLUCOSE, CAPILLARY     Status: Abnormal   Collection Time    08/26/14  4:33 PM      Result Value Ref Range   Glucose-Capillary 166 (*) 70 - 99 mg/dL  GLUCOSE, CAPILLARY     Status: Abnormal   Collection Time    08/26/14  9:24 PM  Result Value Ref  Range   Glucose-Capillary 204 (*) 70 - 99 mg/dL  BASIC METABOLIC PANEL     Status: Abnormal   Collection Time    08/27/14  4:00 AM      Result Value Ref Range   Sodium 138  137 - 147 mEq/L   Potassium 3.7  3.7 - 5.3 mEq/L   Chloride 99  96 - 112 mEq/L   CO2 28  19 - 32 mEq/L   Glucose, Bld 160 (*) 70 - 99 mg/dL   BUN 54 (*) 6 - 23 mg/dL   Creatinine, Ser 1.67 (*) 0.50 - 1.35 mg/dL   Calcium 8.2 (*) 8.4 - 10.5 mg/dL   GFR calc non Af Amer 40 (*) >90 mL/min   GFR calc Af Amer 46 (*) >90 mL/min   Comment: (NOTE)     The eGFR has been calculated using the CKD EPI equation.     This calculation has not been validated in all clinical situations.     eGFR's persistently <90 mL/min signify possible Chronic Kidney     Disease.   Anion gap 11  5 - 15  HEMOGLOBIN AND HEMATOCRIT, BLOOD     Status: Abnormal   Collection Time    08/27/14  4:00 AM      Result Value Ref Range   Hemoglobin 8.6 (*) 13.0 - 17.0 g/dL   Comment: POST TRANSFUSION SPECIMEN   HCT 25.8 (*) 39.0 - 52.0 %  OCCULT BLOOD X 1 CARD TO LAB, STOOL     Status: Abnormal   Collection Time    08/27/14  5:28 AM      Result Value Ref Range   Fecal Occult Bld POSITIVE (*) NEGATIVE  GLUCOSE, CAPILLARY     Status: Abnormal   Collection Time    08/27/14  8:05 AM      Result Value Ref Range   Glucose-Capillary 136 (*) 70 - 99 mg/dL  GLUCOSE, CAPILLARY     Status: Abnormal   Collection Time    08/27/14 11:43 AM      Result Value Ref Range   Glucose-Capillary 173 (*) 70 - 99 mg/dL  GLUCOSE, CAPILLARY     Status: Abnormal   Collection Time    08/27/14  4:35 PM      Result Value Ref Range   Glucose-Capillary 163 (*) 70 - 99 mg/dL  GLUCOSE, CAPILLARY     Status: Abnormal   Collection Time    08/27/14  9:19 PM      Result Value Ref Range   Glucose-Capillary 139 (*) 70 - 99 mg/dL  BASIC METABOLIC PANEL     Status: Abnormal   Collection Time    08/28/14  3:15 AM      Result Value Ref Range   Sodium 140  137 - 147 mEq/L    Potassium 4.1  3.7 - 5.3 mEq/L   Chloride 103  96 - 112 mEq/L   CO2 28  19 - 32 mEq/L   Glucose, Bld 115 (*) 70 - 99 mg/dL   BUN 48 (*) 6 - 23 mg/dL   Creatinine, Ser 1.57 (*) 0.50 - 1.35 mg/dL   Calcium 7.9 (*) 8.4 - 10.5 mg/dL   GFR calc non Af Amer 43 (*) >90 mL/min   GFR calc Af Amer 50 (*) >90 mL/min   Comment: (NOTE)     The eGFR has been calculated using the CKD EPI equation.     This calculation has not been validated  in all clinical situations.     eGFR's persistently <90 mL/min signify possible Chronic Kidney     Disease.   Anion gap 9  5 - 15  HEMOGLOBIN AND HEMATOCRIT, BLOOD     Status: Abnormal   Collection Time    08/28/14  3:15 AM      Result Value Ref Range   Hemoglobin 9.2 (*) 13.0 - 17.0 g/dL   HCT 27.7 (*) 39.0 - 52.0 %  GLUCOSE, CAPILLARY     Status: Abnormal   Collection Time    08/28/14  8:19 AM      Result Value Ref Range   Glucose-Capillary 102 (*) 70 - 99 mg/dL    Imaging: No results found.  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 EF 5 -10% by LV gram - following non-STEMI and ventricular tachycardia   AKI (acute kidney injury)   Anemia, unspecified   Plan:  1. NSVT improved. Feels better. Ambulating but severely deconditioned. May need CIR consult. Change lasix from IV to po. Add  back lisinopril instead of hydralazine in light of improving renal function. May be able to transfer out of stepdown tomorrow if he remains free of arrhythmias today.  Time Spent Directly with Patient: 15 minutes  Length of Stay:  LOS: 15 days   Pixie Casino, MD, [facility] Attending Cardiologist [facility]  Ellee Wawrzyniak C 08/28/2014, 11:29 AM

## 2014-08-28 NOTE — Progress Notes (Signed)
PROGRESS NOTE  Kathleen Likins JQB:[id number] DOB: December 15, 1946 DOA: 08/13/2014 PCP: [name], MD  68 year old male with 32 pack active smoker, full code with known CAD and s/p stent [facility] > 10 years ago and copd with o2 dependency Admitted 08/13/14  with  a chief complaint of bleeding in his bowels and a swollen abdomen along with a story that sounded like mesentric angina (post prandial abd discmfrt and weight Loss and ability to handle only small amounts of food at at time). At admission he ruled out for MI. Then on evening of 08/15/14 he develped sudden onset chest pain, with sinus tachycardia with EKG change to duffise ST depression in inferior and lateral leads and respiratory distress with CXR suggestive of flash pulmonary edema. Despite lasix he developed 2 x episodes of V Tach with normal BP and needing IV amio (never needed cpr). Patient emergently intubated on [place] floor and moved to [facility] at [facility].   8/30 he was extubated  Then on 8/31 early AM: re-intubated. Edema pattern on CXR. Milrinone initiated by Cardiology  9/01 Extubated. Remained slightly encephalopathic due to residual sedating medications. Discussed re-intubation status with pt (who did not seem to fully grasp the conversation) and his daughter who feels certain that he would not wish to undergo repeat intubation or ACLS in this situation where there are very limited treatment options and where recurrent respiratory fialure would be indicative of failure of those few options  Assessment/Plan: Acute on chronic systolic heart failure-  Per cardiology  Severe oxygen requiring COPD - wears O2 at home and still smokes Change albuterol to xopenex due to arrhythmias. No wheeze today. Taper prednisone  Status post salvage LAD stent in the setting of cardiogenic shock and pulmonary edema   Ventricular tachycardia, on amiodarone gtt. Keep in SDU per cardiology  AKI Creatinine stable  Chronic intermittent abd pain, concern for  mesenteric ischemia  Hematochezia, resolved Consider CTA abdomen/pelvis when clinically stable per GI recs No pain after eating  Anemia secondary to blood loss and critical illness hgb improving  Hyperglycemia SSI  Code Status: DNR Family Communication: friend at bedside 9/8 Disposition Plan: refuses SNF   Consultants:  PCCM  Cards  pallaitive care  Procedures:  cath   HPI/Subjective: No complaints  Objective: Filed Vitals:   08/28/14 1200  BP: 127/101  Pulse: 78  Temp:   Resp: 19    Intake/Output Summary (Last 24 hours) at 08/28/14 1306 Last data filed at 08/28/14 1200  Gross per 24 hour  Intake 1158.85 ml  Output   2000 ml  Net -841.15 ml   Filed Weights   08/23/14 1200 08/27/14 0400 08/28/14 0300  Weight: 75 kg (165 lb 5.5 oz) 76.7 kg (169 lb 1.5 oz) 76 kg (167 lb 8.8 oz)    Exam:   General:  In chair  Cardiovascular: rrr without MGR  Respiratory: CTA without WRR  Abdomen: +BS, soft, NT, ND  Ext: SCDs. No CCE  Data Reviewed: Basic Metabolic Panel:  Recent Labs Lab 08/23/14 0523 08/23/14 1840 08/24/14 0455 08/24/14 1823 08/25/14 0400 08/25/14 2304 08/26/14 0410 08/27/14 0400 08/28/14 0315  NA 136* 137 139 138 138  --  138 138 140  K 3.9 4.0 4.0 3.5* 4.0  --  3.9 3.7 4.1  CL 99 98 101 99 101  --  100 99 103  CO2 26 26 27 24 26   --  26 28 28   GLUCOSE 154* 148* 150* 184* 186*  --  173*  160* 115*  BUN 29* 38* 41* 49* 52*  --  56* 54* 48*  CREATININE 1.75* 2.17* 2.01* 1.95* 1.89*  --  1.92* 1.67* 1.57*  CALCIUM 8.5 8.2* 8.2* 8.2* 8.2*  --  8.3* 8.2* 7.9*  MG 2.3 2.5 2.4 2.3 2.4 2.3  --   --   --   PHOS 2.6  --  4.3  --  3.0  --   --   --   --    Liver Function Tests:  Recent Labs Lab 08/22/14 1150  AST 37  ALT 27  ALKPHOS 112  BILITOT 0.4  PROT 6.2  ALBUMIN 2.4*   No results found for this basename: LIPASE, AMYLASE,  in the last 168 hours No results found for this basename: AMMONIA,  in the last 168  hours CBC:  Recent Labs Lab 08/22/14 1150 08/23/14 0523 08/24/14 0455 08/25/14 0400 08/26/14 0410 08/27/14 0400 08/28/14 0315  WBC 11.5* 11.7* 11.5* 11.2* 11.3*  --   --   NEUTROABS  --  10.5* 10.4* 10.3* 10.4*  --   --   HGB 8.2* 7.9* 7.7* 7.3* 7.1* 8.6* 9.2*  HCT 24.9* 23.7* 23.4* 22.0* 22.2* 25.8* 27.7*  MCV 88.0 88.4 90.3 89.8 87.4  --   --   PLT 189 202 225 252 277  --   --    Cardiac Enzymes: No results found for this basename: CKTOTAL, CKMB, CKMBINDEX, TROPONINI,  in the last 168 hours BNP (last 3 results)  Recent Labs  08/15/14 1956 08/20/14 0500 08/22/14 1150  PROBNP 2319.0* 51736.0* 43395.0*   CBG:  Recent Labs Lab 08/27/14 1143 08/27/14 1635 08/27/14 2119 08/28/14 0819 08/28/14 1158  GLUCAP 173* 163* 139* 102* 112*    Recent Results (from the past 240 hour(s))  CLOSTRIDIUM DIFFICILE BY PCR     Status: None   Collection Time    08/20/14  5:06 PM      Result Value Ref Range Status   C difficile by pcr NEGATIVE  NEGATIVE Final     Studies: No results found.  Scheduled Meds: . amiodarone  400 mg Oral BID  . aspirin  81 mg Oral Daily  . atorvastatin  80 mg Oral q1800  . budesonide (PULMICORT) nebulizer solution  0.5 mg Nebulization BID  . clopidogrel  75 mg Oral Daily  . docusate sodium  100 mg Oral BID  . fluticasone  2 spray Each Nare Daily  . furosemide  80 mg Oral BID  . hydrocortisone   Rectal BID  . insulin aspart  0-15 Units Subcutaneous TID WC  . ipratropium  0.5 mg Nebulization TID  . levalbuterol  0.63 mg Nebulization TID  . levofloxacin  750 mg Oral Q48H  . lisinopril  10 mg Oral Daily  . predniSONE  60 mg Oral Q breakfast  . ranolazine  500 mg Oral BID  . sodium chloride  3 mL Intravenous Q12H  . sodium chloride  3 mL Intravenous Q12H  . triamcinolone cream   Topical BID   Continuous Infusions: . sodium chloride Stopped (08/26/14 1900)   Antibiotics Given (last 72 hours)   Date/Time Action Medication Dose   08/27/14 0951  Given   levofloxacin (LEVAQUIN) tablet 750 mg 750 mg      Time spent: 35 min  Delfina Redwood, MD  [facility] Pager [phone]. If 7PM-7AM, please contact night-coverage at [email or web address], password [facility] 08/28/2014, 1:06 PM  LOS: 15 days

## 2014-08-28 NOTE — Progress Notes (Signed)
CARDIAC REHAB PHASE I   PRE:  Rate/Rhythm: 87 SR    BP: sitting 137/42    SaO2: 98 4L  MODE:  Ambulation: to [facility] then door of room   POST:  Rate/Rhythm: 121 ST with numerous 3-4 bt runs PVCs    BP: sitting 158/58     SaO2: 91 4L  Pt to BSC for BM then able to walk to door of room with assist x2, RW, gait belt and 4L O2. Pts legs weak and he preferred bearing weight on RW through his arms. Fatigued and SOB at door of room and pt began having 2-4 bt PVCs. Pt walked back to recliner. SaO2 low. Will continue to follow along with PT. [id number]   Darrick Meigs CES, ACSM 08/28/2014 10:05 AM

## 2014-08-28 NOTE — Progress Notes (Signed)
Left IJ CVC removed per order following completion of currently infusing bag of amiodarone. Patient placed supine with HOB flat, CVC removed upon exhalation with stable vital signs and no evidence of patient distress noted throughout procedure. Small amount bloody drainage noted from site, manual pressure held over site upon removal of catheter x7 minutes with hemostasis obtained. Petroleum gauze and 4x4 gauze and transparent occlusive dressing applied. Will continue to assess and monitor patient closely.

## 2014-08-29 LAB — BASIC METABOLIC PANEL
Anion gap: 8 (ref 5–15)
BUN: 44 mg/dL — ABNORMAL HIGH (ref 6–23)
CHLORIDE: 100 meq/L (ref 96–112)
CO2: 29 meq/L (ref 19–32)
Calcium: 8.3 mg/dL — ABNORMAL LOW (ref 8.4–10.5)
Creatinine, Ser: 1.6 mg/dL — ABNORMAL HIGH (ref 0.50–1.35)
GFR calc Af Amer: 49 mL/min — ABNORMAL LOW (ref >90)
GFR calc non Af Amer: 42 mL/min — ABNORMAL LOW (ref >90)
Glucose, Bld: 104 mg/dL — ABNORMAL HIGH (ref 70–99)
Potassium: 4.2 mEq/L (ref 3.7–5.3)
Sodium: 137 mEq/L (ref 137–147)

## 2014-08-29 LAB — GLUCOSE, CAPILLARY
GLUCOSE-CAPILLARY: 92 mg/dL (ref 70–99)
GLUCOSE-CAPILLARY: 156 mg/dL — AB (ref 70–99)
Glucose-Capillary: 141 mg/dL — ABNORMAL HIGH (ref 70–99)
Glucose-Capillary: 165 mg/dL — ABNORMAL HIGH (ref 70–99)

## 2014-08-29 LAB — PRO B NATRIURETIC PEPTIDE: Pro B Natriuretic peptide (BNP): 16284 pg/mL — ABNORMAL HIGH (ref 0–125)

## 2014-08-29 MED ORDER — PREDNISONE 20 MG PO TABS
40.0000 mg | ORAL_TABLET | Freq: Every day | ORAL | Status: DC
  Administered 2014-08-30 – 2014-09-01 (×3): 40 mg via ORAL
  Filled 2014-08-29 (×5): qty 2

## 2014-08-29 NOTE — Progress Notes (Signed)
PROGRESS NOTE  Douglas Mann VPX:[id number] DOB: [birth year] DOA: 08/13/2014 PCP: [name], MD  68 year old male with 97 pack active smoker, full code with known CAD and s/p stent [facility] > 10 years ago and copd with o2 dependency Admitted 08/13/14  with  a chief complaint of bleeding in his bowels and a swollen abdomen along with a story that sounded like mesentric angina (post prandial abd discmfrt and weight Loss and ability to handle only small amounts of food at at time). At admission he ruled out for MI. Then on evening of 08/15/14 he develped sudden onset chest pain, with sinus tachycardia with EKG change to duffise ST depression in inferior and lateral leads and respiratory distress with CXR suggestive of flash pulmonary edema. Despite lasix he developed 2 x episodes of V Tach with normal BP and needing IV amio (never needed cpr). Patient emergently intubated on [place] floor and moved to ICU at [facility].   8/30 he was extubated  Then on 8/31 early AM: re-intubated. Edema pattern on CXR. Milrinone initiated by Cardiology  9/01 Extubated. Remained slightly encephalopathic due to residual sedating medications. Discussed re-intubation status with pt (who did not seem to fully grasp the conversation) and his daughter who feels certain that he would not wish to undergo repeat intubation or ACLS in this situation where there are very limited treatment options and where recurrent respiratory fialure would be indicative of failure of those few options  Assessment/Plan: Acute on chronic systolic heart failure-  Per cardiology. improving  Severe oxygen requiring COPD - wears O2 at home and still smokes atrovent and xopenex due to arrhythmias. No wheeze today. Taper prednisone. Completed course of antibiotics  Status post salvage LAD stent in the setting of cardiogenic shock and pulmonary edema   Ventricular tachycardia. Resolved on amiodarone  AKI Creatinine stable  Hematochezia, resolved Suspect  hemorroids. No plans for endoscopy. S/p pRBC  Anemia secondary to blood loss and critical illness hgb improving  Hyperglycemia SSI  Code Status: DNR Family Communication: friend at bedside Disposition Plan: refuses SNF   Consultants:  PCCM  Cards  pallaitive care  Procedures:  cath   HPI/Subjective: Breathing continues to improve. Weak. No bleeding  Objective: Filed Vitals:   08/29/14 1201  BP: 106/40  Pulse: 79  Temp: 97.9 F (36.6 C)  Resp: 17    Intake/Output Summary (Last 24 hours) at 08/29/14 1305 Last data filed at 08/29/14 1200  Gross per 24 hour  Intake    730 ml  Output   1225 ml  Net   -495 ml   Filed Weights   08/27/14 0400 08/28/14 0300 08/29/14 0300  Weight: 76.7 kg (169 lb 1.5 oz) 76 kg (167 lb 8.8 oz) 74.9 kg (165 lb 2 oz)    Exam:   General:  In chair  Cardiovascular: rrr without MGR  Respiratory: CTA without WRR  Abdomen: +BS, soft, NT, ND  Ext: SCDs. No CCE  Data Reviewed: Basic Metabolic Panel:  Recent Labs Lab 08/23/14 0523 08/23/14 1840 08/24/14 0455 08/24/14 1823 08/25/14 0400 08/25/14 2304 08/26/14 0410 08/27/14 0400 08/28/14 0315 08/29/14 0500  NA 136* 137 139 138 138  --  138 138 140 137  K 3.9 4.0 4.0 3.5* 4.0  --  3.9 3.7 4.1 4.2  CL 99 98 101 99 101  --  100 99 103 100  CO2 26 26 27 24 26   --  26 28 28 29   GLUCOSE 154* 148* 150* 184* 186*  --  173* 160* 115* 104*  BUN 29* 38* 41* 49* 52*  --  56* 54* 48* 44*  CREATININE 1.75* 2.17* 2.01* 1.95* 1.89*  --  1.92* 1.67* 1.57* 1.60*  CALCIUM 8.5 8.2* 8.2* 8.2* 8.2*  --  8.3* 8.2* 7.9* 8.3*  MG 2.3 2.5 2.4 2.3 2.4 2.3  --   --   --   --   PHOS 2.6  --  4.3  --  3.0  --   --   --   --   --    Liver Function Tests: No results found for this basename: AST, ALT, ALKPHOS, BILITOT, PROT, ALBUMIN,  in the last 168 hours No results found for this basename: LIPASE, AMYLASE,  in the last 168 hours No results found for this basename: AMMONIA,  in the last 168  hours CBC:  Recent Labs Lab 08/23/14 0523 08/24/14 0455 08/25/14 0400 08/26/14 0410 08/27/14 0400 08/28/14 0315  WBC 11.7* 11.5* 11.2* 11.3*  --   --   NEUTROABS 10.5* 10.4* 10.3* 10.4*  --   --   HGB 7.9* 7.7* 7.3* 7.1* 8.6* 9.2*  HCT 23.7* 23.4* 22.0* 22.2* 25.8* 27.7*  MCV 88.4 90.3 89.8 87.4  --   --   PLT 202 225 252 277  --   --    Cardiac Enzymes: No results found for this basename: CKTOTAL, CKMB, CKMBINDEX, TROPONINI,  in the last 168 hours BNP (last 3 results)  Recent Labs  08/20/14 0500 08/22/14 1150 08/29/14 0448  PROBNP 51736.0* 43395.0* 16284.0*   CBG:  Recent Labs Lab 08/28/14 1158 08/28/14 1650 08/28/14 2121 08/29/14 0744 08/29/14 1204  GLUCAP 112* 140* 151* 92 156*    Recent Results (from the past 240 hour(s))  CLOSTRIDIUM DIFFICILE BY PCR     Status: None   Collection Time    08/20/14  5:06 PM      Result Value Ref Range Status   C difficile by pcr NEGATIVE  NEGATIVE Final     Studies: No results found.  Scheduled Meds: . amiodarone  400 mg Oral BID  . aspirin  81 mg Oral Daily  . atorvastatin  80 mg Oral q1800  . budesonide (PULMICORT) nebulizer solution  0.5 mg Nebulization BID  . clopidogrel  75 mg Oral Daily  . docusate sodium  100 mg Oral BID  . fluticasone  2 spray Each Nare Daily  . furosemide  80 mg Oral BID  . hydrocortisone   Rectal BID  . insulin aspart  0-15 Units Subcutaneous TID WC  . ipratropium  0.5 mg Nebulization TID  . levalbuterol  0.63 mg Nebulization TID  . levofloxacin  750 mg Oral Q48H  . lisinopril  10 mg Oral Daily  . predniSONE  50 mg Oral Q breakfast  . ranolazine  500 mg Oral BID  . sodium chloride  3 mL Intravenous Q12H  . sodium chloride  3 mL Intravenous Q12H  . triamcinolone cream   Topical BID   Continuous Infusions: . sodium chloride Stopped (08/26/14 1900)   Antibiotics Given (last 72 hours)   Date/Time Action Medication Dose   08/27/14 0951 Given   levofloxacin (LEVAQUIN) tablet 750 mg  750 mg   08/29/14 1016 Given   levofloxacin (LEVAQUIN) tablet 750 mg 750 mg      Time spent: 15 min  Delfina Redwood, MD  [facility] Pager [phone]. If 7PM-7AM, please contact night-coverage at [email or web address], password [facility] 08/29/2014, 1:05 PM  LOS: 16 days

## 2014-08-29 NOTE — Progress Notes (Signed)
DAILY PROGRESS NOTE  Subjective:  No further NSVT. Now on po amiodarone and ranexa 500 mg BID. Very weak, not ambulating well.  Objective:  Temp:  [97.6 F (36.4 C)-98.8 F (37.1 C)] 97.9 F (36.6 C) (09/10 1201) Pulse Rate:  [67-83] 79 (09/10 1201) Resp:  [11-23] 17 (09/10 1201) BP: (103-138)/(40-69) 106/40 mmHg (09/10 1201) SpO2:  [95 %-100 %] 98 % (09/10 1201) Weight:  [165 lb 2 oz (74.9 kg)] 165 lb 2 oz (74.9 kg) (09/10 0300) Weight change: -2 lb 6.8 oz (-1.1 kg)  Intake/Output from previous day: 09/09 0701 - 09/10 0700 In: 490 [P.O.:490] Out: 2325 [Urine:2325]  Intake/Output from this shift: Total I/O In: 100 [P.O.:100] Out: -   Medications: Current Facility-Administered Medications  Medication Dose Route Frequency Provider Last Rate Last Dose  . 0.9 %  sodium chloride infusion  250 mL Intravenous PRN Allyne Gee, MD   250 mL at 08/17/14 2030  . 0.9 %  sodium chloride infusion   Intravenous Continuous Brand Males, MD   7 mL/hr at 08/23/14 1255  . amiodarone (PACERONE) tablet 400 mg  400 mg Oral BID Fay Records, MD   400 mg at 08/29/14 1016  . aspirin chewable tablet 81 mg  81 mg Oral Daily Wilhelmina Mcardle, MD   81 mg at 08/29/14 1016  . atorvastatin (LIPITOR) tablet 80 mg  80 mg Oral q1800 Wilhelmina Mcardle, MD   80 mg at 08/28/14 1713  . budesonide (PULMICORT) nebulizer solution 0.5 mg  0.5 mg Nebulization BID Chesley Mires, MD   0.5 mg at 08/29/14 0836  . clopidogrel (PLAVIX) tablet 75 mg  75 mg Oral Daily Wilhelmina Mcardle, MD   75 mg at 08/29/14 0756  . docusate sodium (COLACE) capsule 100 mg  100 mg Oral BID Delfina Redwood, MD   100 mg at 08/28/14 2207  . fluticasone (FLONASE) 50 MCG/ACT nasal spray 2 spray  2 spray Each Nare Daily Geradine Girt, DO   2 spray at 08/29/14 1016  . furosemide (LASIX) tablet 80 mg  80 mg Oral BID Delfina Redwood, MD   80 mg at 08/29/14 0756  . guaiFENesin (MUCINEX) 12 hr tablet 600 mg  600 mg Oral BID PRN Delfina Redwood, MD      . hydrocortisone (ANUSOL-HC) 2.5 % rectal cream   Rectal BID Allyne Gee, MD      . insulin aspart (novoLOG) injection 0-15 Units  0-15 Units Subcutaneous TID WC Jeryl Columbia, NP   2 Units at 08/28/14 1712  . ipratropium (ATROVENT) nebulizer solution 0.5 mg  0.5 mg Nebulization TID Delfina Redwood, MD   0.5 mg at 08/29/14 0836  . levalbuterol (XOPENEX) nebulizer solution 0.63 mg  0.63 mg Nebulization TID Delfina Redwood, MD   0.63 mg at 08/29/14 0836  . levalbuterol (XOPENEX) nebulizer solution 0.63 mg  0.63 mg Nebulization Q2H PRN Delfina Redwood, MD      . levofloxacin [facility]) tablet 750 mg  750 mg Oral Q48H Manley Mason, RPH   750 mg at 08/29/14 1016  . lisinopril (PRINIVIL,ZESTRIL) tablet 10 mg  10 mg Oral Daily Delfina Redwood, MD   10 mg at 08/29/14 1016  . LORazepam (ATIVAN) injection 1 mg  1 mg Intravenous Q4H PRN Geradine Girt, DO   1 mg at 08/26/14 0030  . oxyCODONE (Oxy IR/ROXICODONE) immediate release tablet 5 mg  5 mg Oral Q4H PRN Marjory Lies  John Lampkin, DO   5 mg at 08/28/14 2206  . predniSONE (DELTASONE) tablet 50 mg  50 mg Oral Q breakfast Delfina Redwood, MD   50 mg at 08/29/14 0756  . ranolazine (RANEXA) 12 hr tablet 500 mg  500 mg Oral BID Pixie Casino, MD   500 mg at 08/29/14 1016  . senna (SENOKOT) tablet 17.2 mg  2 tablet Oral Daily PRN Delfina Redwood, MD      . simethicone (MYLICON) 40 OX/7.3ZH suspension 40 mg  40 mg Oral QID PRN Brock Ra Lampkin, DO   40 mg at 08/29/14 0756  . sodium chloride 0.9 % injection 10-40 mL  10-40 mL Intracatheter PRN Geradine Girt, DO   10 mL at 08/25/14 0935  . sodium chloride 0.9 % injection 3 mL  3 mL Intravenous Q12H Allyne Gee, MD   3 mL at 08/25/14 2215  . sodium chloride 0.9 % injection 3 mL  3 mL Intravenous Q12H Allyne Gee, MD   3 mL at 08/28/14 2207  . triamcinolone cream (KENALOG) 0.1 %   Topical BID Allyne Gee, MD        Physical Exam: General appearance: alert and no  distress Neck: no carotid bruit and no JVD Lungs: clear to auscultation bilaterally Heart: regular rate and rhythm and occasional irregularity Abdomen: soft, non-tender; bowel sounds normal; no masses,  no organomegaly Extremities: extremities normal, atraumatic, no cyanosis or edema Pulses: 2+ and symmetric Skin: Skin color, texture, turgor normal. No rashes or lesions Neurologic: Grossly normal Psych: Pleasant  Lab Results: Results for orders placed during the hospital encounter of 08/13/14 (from the past 48 hour(s))  GLUCOSE, CAPILLARY     Status: Abnormal   Collection Time    08/27/14  4:35 PM      Result Value Ref Range   Glucose-Capillary 163 (*) 70 - 99 mg/dL  GLUCOSE, CAPILLARY     Status: Abnormal   Collection Time    08/27/14  9:19 PM      Result Value Ref Range   Glucose-Capillary 139 (*) 70 - 99 mg/dL  BASIC METABOLIC PANEL     Status: Abnormal   Collection Time    08/28/14  3:15 AM      Result Value Ref Range   Sodium 140  137 - 147 mEq/L   Potassium 4.1  3.7 - 5.3 mEq/L   Chloride 103  96 - 112 mEq/L   CO2 28  19 - 32 mEq/L   Glucose, Bld 115 (*) 70 - 99 mg/dL   BUN 48 (*) 6 - 23 mg/dL   Creatinine, Ser 1.57 (*) 0.50 - 1.35 mg/dL   Calcium 7.9 (*) 8.4 - 10.5 mg/dL   GFR calc non Af Amer 43 (*) >90 mL/min   GFR calc Af Amer 50 (*) >90 mL/min   Comment: (NOTE)     The eGFR has been calculated using the CKD EPI equation.     This calculation has not been validated in all clinical situations.     eGFR's persistently <90 mL/min signify possible Chronic Kidney     Disease.   Anion gap 9  5 - 15  HEMOGLOBIN AND HEMATOCRIT, BLOOD     Status: Abnormal   Collection Time    08/28/14  3:15 AM      Result Value Ref Range   Hemoglobin 9.2 (*) 13.0 - 17.0 g/dL   HCT 27.7 (*) 39.0 - 52.0 %  GLUCOSE, CAPILLARY  Status: Abnormal   Collection Time    08/28/14  8:19 AM      Result Value Ref Range   Glucose-Capillary 102 (*) 70 - 99 mg/dL  GLUCOSE, CAPILLARY      Status: Abnormal   Collection Time    08/28/14 11:58 AM      Result Value Ref Range   Glucose-Capillary 112 (*) 70 - 99 mg/dL  GLUCOSE, CAPILLARY     Status: Abnormal   Collection Time    08/28/14  4:50 PM      Result Value Ref Range   Glucose-Capillary 140 (*) 70 - 99 mg/dL  GLUCOSE, CAPILLARY     Status: Abnormal   Collection Time    08/28/14  9:21 PM      Result Value Ref Range   Glucose-Capillary 151 (*) 70 - 99 mg/dL  PRO B NATRIURETIC PEPTIDE     Status: Abnormal   Collection Time    08/29/14  4:48 AM      Result Value Ref Range   Pro B Natriuretic peptide (BNP) 16284.0 (*) 0 - 125 pg/mL  BASIC METABOLIC PANEL     Status: Abnormal   Collection Time    08/29/14  5:00 AM      Result Value Ref Range   Sodium 137  137 - 147 mEq/L   Potassium 4.2  3.7 - 5.3 mEq/L   Chloride 100  96 - 112 mEq/L   CO2 29  19 - 32 mEq/L   Glucose, Bld 104 (*) 70 - 99 mg/dL   BUN 44 (*) 6 - 23 mg/dL   Creatinine, Ser 1.60 (*) 0.50 - 1.35 mg/dL   Calcium 8.3 (*) 8.4 - 10.5 mg/dL   GFR calc non Af Amer 42 (*) >90 mL/min   GFR calc Af Amer 49 (*) >90 mL/min   Comment: (NOTE)     The eGFR has been calculated using the CKD EPI equation.     This calculation has not been validated in all clinical situations.     eGFR's persistently <90 mL/min signify possible Chronic Kidney     Disease.   Anion gap 8  5 - 15  GLUCOSE, CAPILLARY     Status: None   Collection Time    08/29/14  7:44 AM      Result Value Ref Range   Glucose-Capillary 92  70 - 99 mg/dL  GLUCOSE, CAPILLARY     Status: Abnormal   Collection Time    08/29/14 12:04 PM      Result Value Ref Range   Glucose-Capillary 156 (*) 70 - 99 mg/dL    Imaging: No results found.  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 EF 5 -10% by LV gram - following non-STEMI and ventricular tachycardia   AKI (acute kidney injury)   Anemia, unspecified  Plan:  NSVT improved. Feels better. Ok to transfer to telemetry today. Continue amiodarone 400 mg BID x 1 week, then reduce to 400 mg daily.   Time Spent Directly with Patient: 15 minutes  Length of Stay:  LOS: 16 days   Pixie Casino, MD, [facility] Attending Cardiologist [facility]  HILTY,Kenneth C 08/29/2014, 12:10 PM

## 2014-08-29 NOTE — Progress Notes (Signed)
PT Cancellation Note  Patient Details Name: Douglas Mann MRN: [id number] DOB: 1946-01-02   Cancelled Treatment:    Reason Eval/Treat Not Completed: Patient declined, no reason specified (pt states too tired from this am, had labs and vitals only, has not been OOB). Pt educated for purpose and goal of therapy with need to increase mobility. Pt only ambulating max 50' at home and at this time denying therapy despite education and encouragement. Will follow at later date.    Lanetta Inch Beth 08/29/2014, 8:51 AM Elwyn Reach, [place]

## 2014-08-29 NOTE — Progress Notes (Signed)
CARDIAC REHAB PHASE I   PRE:  Rate/Rhythm: 83 SR  BP:  Supine: 118/51  Sitting:   Standing:    SaO2: 97%3L  MODE:  Ambulation: 60 ft   POST:  Rate/Rhythm: 99 SR  BP:  Supine:   Sitting: 124/49  Standing:    SaO2: 93% 4L 2800-3491 Pt made excuses to not walk but was encouraged to walk and thanked [place] afterward. He was able to walk 60 ft outside of room with gait belt use, rolling walker and asst x 2 on 4L. Legs fairly strong. To recliner after walk with call bell.    Graylon Good, RN BSN  08/29/2014 9:39 AM

## 2014-08-30 LAB — GLUCOSE, CAPILLARY
GLUCOSE-CAPILLARY: 123 mg/dL — AB (ref 70–99)
Glucose-Capillary: 99 mg/dL (ref 70–99)
Glucose-Capillary: 131 mg/dL — ABNORMAL HIGH (ref 70–99)
Glucose-Capillary: 128 mg/dL — ABNORMAL HIGH (ref 70–99)

## 2014-08-30 LAB — BASIC METABOLIC PANEL
ANION GAP: 12 (ref 5–15)
BUN: 43 mg/dL — ABNORMAL HIGH (ref 6–23)
CHLORIDE: 101 meq/L (ref 96–112)
CO2: 29 mEq/L (ref 19–32)
Calcium: 8.2 mg/dL — ABNORMAL LOW (ref 8.4–10.5)
Creatinine, Ser: 1.7 mg/dL — ABNORMAL HIGH (ref 0.50–1.35)
GFR calc non Af Amer: 39 mL/min — ABNORMAL LOW (ref >90)
GFR, EST AFRICAN AMERICAN: 45 mL/min — AB (ref >90)
Glucose, Bld: 113 mg/dL — ABNORMAL HIGH (ref 70–99)
Potassium: 4 mEq/L (ref 3.7–5.3)
SODIUM: 142 meq/L (ref 137–147)

## 2014-08-30 LAB — CBC
HCT: 29.9 % — ABNORMAL LOW (ref 39.0–52.0)
Hemoglobin: 9.7 g/dL — ABNORMAL LOW (ref 13.0–17.0)
MCH: 28.6 pg (ref 26.0–34.0)
MCHC: 32.4 g/dL (ref 30.0–36.0)
MCV: 88.2 fL (ref 78.0–100.0)
Platelets: 457 10*3/uL — ABNORMAL HIGH (ref 150–400)
RBC: 3.39 MIL/uL — ABNORMAL LOW (ref 4.22–5.81)
RDW: 15 % (ref 11.5–15.5)
WBC: 19.7 10*3/uL — ABNORMAL HIGH (ref 4.0–10.5)

## 2014-08-30 MED ORDER — FUROSEMIDE 40 MG PO TABS
40.0000 mg | ORAL_TABLET | Freq: Two times a day (BID) | ORAL | Status: DC
  Administered 2014-08-30 – 2014-09-03 (×8): 40 mg via ORAL
  Filled 2014-08-30 (×10): qty 1

## 2014-08-30 MED ORDER — LEVALBUTEROL HCL 0.63 MG/3ML IN NEBU
0.6300 mg | INHALATION_SOLUTION | Freq: Two times a day (BID) | RESPIRATORY_TRACT | Status: DC
Start: 2014-08-31 — End: 2014-08-30

## 2014-08-30 MED ORDER — IPRATROPIUM BROMIDE 0.02 % IN SOLN: 0.5000 mg | Freq: Two times a day (BID) | RESPIRATORY_TRACT | Status: DC

## 2014-08-30 MED ORDER — LEVALBUTEROL HCL 0.63 MG/3ML IN NEBU: 0.6300 mg | INHALATION_SOLUTION | Freq: Two times a day (BID) | RESPIRATORY_TRACT | Status: DC

## 2014-08-30 MED ORDER — IPRATROPIUM BROMIDE 0.02 % IN SOLN
0.5000 mg | Freq: Two times a day (BID) | RESPIRATORY_TRACT | Status: DC
  Administered 2014-08-30 – 2014-09-03 (×7): 0.5 mg via RESPIRATORY_TRACT
  Filled 2014-08-30 (×8): qty 2.5

## 2014-08-30 MED ORDER — MIRTAZAPINE 15 MG PO TABS
15.0000 mg | ORAL_TABLET | Freq: Every day | ORAL | Status: DC
  Administered 2014-08-30 – 2014-09-02 (×4): 15 mg via ORAL
  Filled 2014-08-30 (×5): qty 1

## 2014-08-30 MED ORDER — LEVALBUTEROL HCL 0.63 MG/3ML IN NEBU
0.6300 mg | INHALATION_SOLUTION | Freq: Two times a day (BID) | RESPIRATORY_TRACT | Status: DC
  Administered 2014-08-30 – 2014-09-03 (×7): 0.63 mg via RESPIRATORY_TRACT
  Filled 2014-08-30 (×15): qty 3

## 2014-08-30 NOTE — Progress Notes (Signed)
PROGRESS NOTE  Douglas Mann GUY:[id number] DOB: [birth year] DOA: 08/13/2014 PCP: [name], MD  68 year old male with 33 pack active smoker, full code with known CAD and s/p stent at [facility] > 10 years ago and copd with o2 dependency Admitted 08/13/14  with  a chief complaint of bleeding in his bowels and a swollen abdomen along with a story that sounded like mesentric angina (post prandial abd discmfrt and weight Loss and ability to handle only small amounts of food at at time). At admission he ruled out for MI. Then on evening of 08/15/14 he develped sudden onset chest pain, with sinus tachycardia with EKG change to duffise ST depression in inferior and lateral leads and respiratory distress with CXR suggestive of flash pulmonary edema. Despite lasix he developed 2 x episodes of V Tach with normal BP and needing IV amio (never needed cpr). Patient emergently intubated on [facility] floor and moved to ICU at [facility].   8/30 he was extubated  Then on 8/31 early AM: re-intubated. Edema pattern on CXR. Milrinone initiated by Cardiology  9/01 Extubated. Remained slightly encephalopathic due to residual sedating medications. Discussed re-intubation status with pt (who did not seem to fully grasp the conversation) and his daughter who feels certain that he would not wish to undergo repeat intubation or ACLS in this situation where there are very limited treatment options and where recurrent respiratory fialure would be indicative of failure of those few options  Assessment/Plan: Acute on chronic systolic heart failure-  Per cardiology. improving  Severe oxygen requiring COPD - wears O2 at home and still smokes atrovent and xopenex due to arrhythmias. No wheeze today. Taper prednisone. Completed course of antibiotics  Status post salvage LAD stent in the setting of cardiogenic shock and pulmonary edema   Ventricular tachycardia. Resolved on amiodarone  AKI Creatinine stable  Hematochezia, resolved Suspect  hemorroids. No plans for endoscopy. S/p pRBC  Chronic intermittent abd pain, concern for mesenteric ischemia  Consider CTA abdomen/pelvis when clinically stable per GI recs - renal status needs to be optimized No pain after eating  Anemia secondary to blood loss and critical illness hgb improving  Hyperglycemia SSI  Code Status: DNR Family Communication: friend at bedside Disposition Plan: refuses SNF- needs to get up and move if patient wants to go home   Consultants:  PCCM  Cards  pallaitive care  Procedures:  cath   HPI/Subjective: Breathing well C/o gas  Objective: Filed Vitals:   08/30/14 0522  BP: 92/56  Pulse: 74  Temp: 98 F (36.7 C)  Resp: 16    Intake/Output Summary (Last 24 hours) at 08/30/14 1042 Last data filed at 08/30/14 0839  Gross per 24 hour  Intake    480 ml  Output   1775 ml  Net  -1295 ml   Filed Weights   08/27/14 0400 08/28/14 0300 08/29/14 0300  Weight: 76.7 kg (169 lb 1.5 oz) 76 kg (167 lb 8.8 oz) 74.9 kg (165 lb 2 oz)    Exam:   General:  Pleasant/cooperative  Cardiovascular: rrr without MGR  Respiratory: CTA without WRR  Abdomen: +BS, soft, NT, ND  Ext: SCDs. No CCE  Data Reviewed: Basic Metabolic Panel:  Recent Labs Lab 08/23/14 1840 08/24/14 0455 08/24/14 1823 08/25/14 0400 08/25/14 2304 08/26/14 0410 08/27/14 0400 08/28/14 0315 08/29/14 0500 08/30/14 0500  NA 137 139 138 138  --  138 138 140 137 142  K 4.0 4.0 3.5* 4.0  --  3.9 3.7 4.1 4.2  4.0  CL 98 101 99 101  --  100 99 103 100 101  CO2 26 27 24 26   --  26 28 28 29 29   GLUCOSE 148* 150* 184* 186*  --  173* 160* 115* 104* 113*  BUN 38* 41* 49* 52*  --  56* 54* 48* 44* 43*  CREATININE 2.17* 2.01* 1.95* 1.89*  --  1.92* 1.67* 1.57* 1.60* 1.70*  CALCIUM 8.2* 8.2* 8.2* 8.2*  --  8.3* 8.2* 7.9* 8.3* 8.2*  MG 2.5 2.4 2.3 2.4 2.3  --   --   --   --   --   PHOS  --  4.3  --  3.0  --   --   --   --   --   --    Liver Function Tests: No results found  for this basename: AST, ALT, ALKPHOS, BILITOT, PROT, ALBUMIN,  in the last 168 hours No results found for this basename: LIPASE, AMYLASE,  in the last 168 hours No results found for this basename: AMMONIA,  in the last 168 hours CBC:  Recent Labs Lab 08/24/14 0455 08/25/14 0400 08/26/14 0410 08/27/14 0400 08/28/14 0315 08/30/14 0500  WBC 11.5* 11.2* 11.3*  --   --  19.7*  NEUTROABS 10.4* 10.3* 10.4*  --   --   --   HGB 7.7* 7.3* 7.1* 8.6* 9.2* 9.7*  HCT 23.4* 22.0* 22.2* 25.8* 27.7* 29.9*  MCV 90.3 89.8 87.4  --   --  88.2  PLT 225 252 277  --   --  457*   Cardiac Enzymes: No results found for this basename: CKTOTAL, CKMB, CKMBINDEX, TROPONINI,  in the last 168 hours BNP (last 3 results)  Recent Labs  08/20/14 0500 08/22/14 1150 08/29/14 0448  PROBNP 51736.0* 43395.0* 16284.0*   CBG:  Recent Labs Lab 08/29/14 0744 08/29/14 1204 08/29/14 1650 08/29/14 2141 08/30/14 0636  GLUCAP 92 156* 141* 165* 99    Recent Results (from the past 240 hour(s))  CLOSTRIDIUM DIFFICILE BY PCR     Status: None   Collection Time    08/20/14  5:06 PM      Result Value Ref Range Status   C difficile by pcr NEGATIVE  NEGATIVE Final     Studies: No results found.  Scheduled Meds: . amiodarone  400 mg Oral BID  . aspirin  81 mg Oral Daily  . atorvastatin  80 mg Oral q1800  . budesonide (PULMICORT) nebulizer solution  0.5 mg Nebulization BID  . clopidogrel  75 mg Oral Daily  . docusate sodium  100 mg Oral BID  . fluticasone  2 spray Each Nare Daily  . furosemide  40 mg Oral BID  . hydrocortisone   Rectal BID  . insulin aspart  0-15 Units Subcutaneous TID WC  . ipratropium  0.5 mg Nebulization TID  . levalbuterol  0.63 mg Nebulization TID  . levofloxacin  750 mg Oral Q48H  . lisinopril  10 mg Oral Daily  . mirtazapine  15 mg Oral QHS  . predniSONE  40 mg Oral Q breakfast  . ranolazine  500 mg Oral BID  . sodium chloride  3 mL Intravenous Q12H  . sodium chloride  3 mL  Intravenous Q12H  . triamcinolone cream   Topical BID   Continuous Infusions: . sodium chloride Stopped (08/26/14 1900)   Antibiotics Given (last 72 hours)   Date/Time Action Medication Dose   08/29/14 1016 Given   levofloxacin (LEVAQUIN) tablet 750 mg 750  mg      Time spent: 15 min  Eulogio Bear, DO [facility] Pager [phone]. If 7PM-7AM, please contact night-coverage at [email or web address], password [facility] 08/30/2014, 10:42 AM  LOS: 17 days

## 2014-08-30 NOTE — Progress Notes (Signed)
CARDIAC REHAB PHASE I   PRE:  Rate/Rhythm: 78 SR     BP: sitting 100/56    SaO2: 97 2 1/2 L  MODE:  Ambulation: 84 ft   POST:  Rate/Rhythm: 92 SR    BP: sitting 86/50, 80/44, 90/50     SaO2: 94 4 L   Pt willing to walk. C/o dizziness upon sitting on EOB but seemed to pass. Used RW, assist x2. Fairly steady, HR stable. Legs get weak with distance and pt relies on his arms more. On return to recliner BP decreased, see above. Pt denied c/o. Will f/u. [id number]  Douglas Mann [place] CES, ACSM 08/30/2014 1:54 PM

## 2014-08-30 NOTE — Progress Notes (Signed)
    DAILY PROGRESS NOTE  Subjective:  No further NSVT. Now on po amiodarone and ranexa 500 mg BID. Very weak, not ambulating well.  Objective:  Temp:  [97.9 F (36.6 C)-98.5 F (36.9 C)] 98 F (36.7 C) (09/11 0522) Pulse Rate:  [74-83] 74 (09/11 0522) Resp:  [16-18] 16 (09/11 0522) BP: (92-110)/(40-64) 92/56 mmHg (09/11 0522) SpO2:  [97 %-100 %] 99 % (09/11 0522) Weight change:   Intake/Output from previous day: 09/10 0701 - 09/11 0700 In: 460 [P.O.:460] Out: 1775 [Urine:1775]  Intake/Output from this shift: Total I/O In: 240 [P.O.:240] Out: -   Medications: . amiodarone  400 mg Oral BID  . aspirin  81 mg Oral Daily  . atorvastatin  80 mg Oral q1800  . budesonide (PULMICORT) nebulizer solution  0.5 mg Nebulization BID  . clopidogrel  75 mg Oral Daily  . docusate sodium  100 mg Oral BID  . fluticasone  2 spray Each Nare Daily  . furosemide  80 mg Oral BID  . hydrocortisone   Rectal BID  . insulin aspart  0-15 Units Subcutaneous TID WC  . ipratropium  0.5 mg Nebulization TID  . levalbuterol  0.63 mg Nebulization TID  . levofloxacin  750 mg Oral Q48H  . lisinopril  10 mg Oral Daily  . predniSONE  40 mg Oral Q breakfast  . ranolazine  500 mg Oral BID  . sodium chloride  3 mL Intravenous Q12H  . sodium chloride  3 mL Intravenous Q12H  . triamcinolone cream   Topical BID   Physical Exam: General appearance: alert and no distress Neck: no carotid bruit and no JVD Lungs: expiratory wheezes B/L Heart: regular rate and rhythm and occasional irregularity Abdomen: soft, non-tender; bowel sounds normal; no masses,  no organomegaly Extremities: extremities normal, atraumatic, no cyanosis or edema Pulses: 2+ and symmetric Skin: Skin color, texture, turgor normal. 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 EF 5 -10% by LV gram - following non-STEMI and ventricular tachycardia   AKI (acute kidney injury)   Anemia, unspecified   Plan:  1. NSVT improved, none in the last 24 hours, no PVCs. Feels better. Ok to transfer to telemetry today. Continue amiodarone 400 mg BID x 1 week, then reduce to 400 mg daily.  2. Acute combined syst and diast CHF - No signs of fluid overload. Increasing Crea (1.57--> 1.7), I would decrease Lasix to 40 mg po BID.  3. COPD - Actively wheezing, on respiratory therapy and oral prednisone.  4. CAD, s/p NSTEMI - No more chest pain on ranolazine.   Length of Stay:  LOS: 17 days   Dorothy Spark 08/30/2014, 9:28 AM

## 2014-08-31 LAB — CBC
HCT: 30.1 % — ABNORMAL LOW (ref 39.0–52.0)
Hemoglobin: 10 g/dL — ABNORMAL LOW (ref 13.0–17.0)
MCH: 29.2 pg (ref 26.0–34.0)
MCHC: 33.2 g/dL (ref 30.0–36.0)
MCV: 87.8 fL (ref 78.0–100.0)
PLATELETS: 448 10*3/uL — AB (ref 150–400)
RBC: 3.43 MIL/uL — AB (ref 4.22–5.81)
RDW: 15 % (ref 11.5–15.5)
WBC: 16.5 10*3/uL — AB (ref 4.0–10.5)

## 2014-08-31 LAB — BASIC METABOLIC PANEL
ANION GAP: 9 (ref 5–15)
BUN: 43 mg/dL — AB (ref 6–23)
CO2: 31 mEq/L (ref 19–32)
CREATININE: 1.7 mg/dL — AB (ref 0.50–1.35)
Calcium: 8.2 mg/dL — ABNORMAL LOW (ref 8.4–10.5)
Chloride: 98 mEq/L (ref 96–112)
GFR calc Af Amer: 45 mL/min — ABNORMAL LOW (ref >90)
GFR, EST NON AFRICAN AMERICAN: 39 mL/min — AB (ref >90)
Glucose, Bld: 89 mg/dL (ref 70–99)
POTASSIUM: 4.1 meq/L (ref 3.7–5.3)
Sodium: 138 mEq/L (ref 137–147)

## 2014-08-31 LAB — GLUCOSE, CAPILLARY
GLUCOSE-CAPILLARY: 85 mg/dL (ref 70–99)
Glucose-Capillary: 139 mg/dL — ABNORMAL HIGH (ref 70–99)
Glucose-Capillary: 135 mg/dL — ABNORMAL HIGH (ref 70–99)
Glucose-Capillary: 115 mg/dL — ABNORMAL HIGH (ref 70–99)

## 2014-08-31 NOTE — Progress Notes (Signed)
PROGRESS NOTE  Douglas Mann TIR:[id number] DOB: 1946/04/24 DOA: 08/13/2014 PCP: [name], MD  68 year old male with 25 pack active smoker, full code with known CAD and s/p stent at [facility] > 10 years ago and copd with o2 dependency Admitted 08/13/14  with  a chief complaint of bleeding in his bowels and a swollen abdomen along with a story that sounded like mesentric angina (post prandial abd discmfrt and weight Loss and ability to handle only small amounts of food at at time). At admission he ruled out for MI. Then on evening of 08/15/14 he develped sudden onset chest pain, with sinus tachycardia with EKG change to duffise ST depression in inferior and lateral leads and respiratory distress with CXR suggestive of flash pulmonary edema. Despite lasix he developed 2 x episodes of V Tach with normal BP and needing IV amio (never needed cpr). Patient emergently intubated on [place] floor and moved to ICU at [facility].   8/30 he was extubated  Then on 8/31 early AM: re-intubated. Edema pattern on CXR. Milrinone initiated by Cardiology  9/01 Extubated. Remained slightly encephalopathic due to residual sedating medications. Discussed re-intubation status with pt (who did not seem to fully grasp the conversation) and his daughter who feels certain that he would not wish to undergo repeat intubation or ACLS in this situation where there are very limited treatment options and where recurrent respiratory fialure would be indicative of failure of those few options  Assessment/Plan: Acute on chronic systolic heart failure-  Per cardiology. improving  Severe oxygen requiring COPD - wears O2 at home and still smokes atrovent and xopenex due to arrhythmias. No wheeze today. Taper prednisone slowly. Completed course of antibiotics  Status post salvage LAD stent in the setting of cardiogenic shock and pulmonary edema   Ventricular tachycardia. Resolved on amiodarone  AKI Creatinine stable  Hematochezia,  resolved Suspect hemorroids. No plans for endoscopy. S/p pRBC  Chronic intermittent abd pain, concern for mesenteric ischemia  Consider CTA abdomen/pelvis when clinically stable per GI recs - renal status needs to be optimized No pain after eating  Anemia secondary to blood loss and critical illness hgb improving  Hyperglycemia SSI  Code Status: DNR Family Communication: friend at bedside Disposition Plan: refuses SNF-moving better- hope to d/c 1-2 days   Consultants:  PCCM  Cards  pallaitive care  Procedures:  cath   HPI/Subjective: Ready to go home  Objective: Filed Vitals:   08/31/14 0624  BP: 109/64  Pulse: 73  Temp: 98.7 F (37.1 C)  Resp: 18    Intake/Output Summary (Last 24 hours) at 08/31/14 0900 Last data filed at 08/31/14 0800  Gross per 24 hour  Intake    600 ml  Output   1450 ml  Net   -850 ml   Filed Weights   08/27/14 0400 08/28/14 0300 08/29/14 0300  Weight: 76.7 kg (169 lb 1.5 oz) 76 kg (167 lb 8.8 oz) 74.9 kg (165 lb 2 oz)    Exam:   General:  Pleasant/cooperative  Cardiovascular: rrr without MGR  Respiratory: decreased, no wheezing  Abdomen: +BS, soft, NT, ND  Ext: SCDs. No CCE  Data Reviewed: Basic Metabolic Panel:  Recent Labs Lab 08/24/14 1823 08/25/14 0400 08/25/14 2304  08/27/14 0400 08/28/14 0315 08/29/14 0500 08/30/14 0500 08/31/14 0708  NA 138 138  --   < > 138 140 137 142 138  K 3.5* 4.0  --   < > 3.7 4.1 4.2 4.0 4.1  CL 99 101  --   < >  99 103 100 101 98  CO2 24 26  --   < > 28 28 29 29 31   GLUCOSE 184* 186*  --   < > 160* 115* 104* 113* 89  BUN 49* 52*  --   < > 54* 48* 44* 43* 43*  CREATININE 1.95* 1.89*  --   < > 1.67* 1.57* 1.60* 1.70* 1.70*  CALCIUM 8.2* 8.2*  --   < > 8.2* 7.9* 8.3* 8.2* 8.2*  MG 2.3 2.4 2.3  --   --   --   --   --   --   PHOS  --  3.0  --   --   --   --   --   --   --   < > = values in this interval not displayed. Liver Function Tests: No results found for this basename:  AST, ALT, ALKPHOS, BILITOT, PROT, ALBUMIN,  in the last 168 hours No results found for this basename: LIPASE, AMYLASE,  in the last 168 hours No results found for this basename: AMMONIA,  in the last 168 hours CBC:  Recent Labs Lab 08/25/14 0400 08/26/14 0410 08/27/14 0400 08/28/14 0315 08/30/14 0500 08/31/14 0708  WBC 11.2* 11.3*  --   --  19.7* 16.5*  NEUTROABS 10.3* 10.4*  --   --   --   --   HGB 7.3* 7.1* 8.6* 9.2* 9.7* 10.0*  HCT 22.0* 22.2* 25.8* 27.7* 29.9* 30.1*  MCV 89.8 87.4  --   --  88.2 87.8  PLT 252 277  --   --  457* 448*   Cardiac Enzymes: No results found for this basename: CKTOTAL, CKMB, CKMBINDEX, TROPONINI,  in the last 168 hours BNP (last 3 results)  Recent Labs  08/20/14 0500 08/22/14 1150 08/29/14 0448  PROBNP 51736.0* 43395.0* 16284.0*   CBG:  Recent Labs Lab 08/30/14 0636 08/30/14 1119 08/30/14 1624 08/30/14 2141 08/31/14 0621  GLUCAP 99 131* 123* 128* 85    No results found for this or any previous visit (from the past 240 hour(s)).   Studies: No results found.  Scheduled Meds: . amiodarone  400 mg Oral BID  . aspirin  81 mg Oral Daily  . atorvastatin  80 mg Oral q1800  . budesonide (PULMICORT) nebulizer solution  0.5 mg Nebulization BID  . clopidogrel  75 mg Oral Daily  . docusate sodium  100 mg Oral BID  . fluticasone  2 spray Each Nare Daily  . furosemide  40 mg Oral BID  . hydrocortisone   Rectal BID  . insulin aspart  0-15 Units Subcutaneous TID WC  . ipratropium  0.5 mg Nebulization BID  . levalbuterol  0.63 mg Nebulization BID  . levofloxacin  750 mg Oral Q48H  . lisinopril  10 mg Oral Daily  . mirtazapine  15 mg Oral QHS  . predniSONE  40 mg Oral Q breakfast  . ranolazine  500 mg Oral BID  . sodium chloride  3 mL Intravenous Q12H  . sodium chloride  3 mL Intravenous Q12H  . triamcinolone cream   Topical BID   Continuous Infusions: . sodium chloride Stopped (08/26/14 1900)   Antibiotics Given (last 72 hours)    Date/Time Action Medication Dose   08/29/14 1016 Given   levofloxacin (LEVAQUIN) tablet 750 mg 750 mg      Time spent: 15 min  Charlestine Rookstool, DO [facility] Pager [phone]. If 7PM-7AM, please contact night-coverage at [email or web address], password [facility] 08/31/2014, 9:00 AM  LOS:  18 days

## 2014-08-31 NOTE — Progress Notes (Signed)
SUBJECTIVE:   No complaints of chest pain or SOB  OBJECTIVE:   Vitals:   Filed Vitals:   08/30/14 1627 08/30/14 2036 08/30/14 2140 08/31/14 0624  BP: 89/53  127/57 109/64  Pulse: 78  77 73  Temp: 98.5 F (36.9 C)  98.7 F (37.1 C) 98.7 F (37.1 C)  TempSrc: Oral  Oral Oral  Resp: 18  18 18   Height:      Weight:      SpO2: 97% 98% 97% 99%   I&O's:   Intake/Output Summary (Last 24 hours) at 08/31/14 7824 Last data filed at 08/30/14 2146  Gross per 24 hour  Intake    480 ml  Output   1450 ml  Net   -970 ml   TELEMETRY: Reviewed telemetry pt in NSR:     PHYSICAL EXAM General: Well developed, well nourished, in no acute distress Head: Eyes PERRLA, No xanthomas.   Normal cephalic and atramatic  Lungs:   Few scattered wheezes Heart:   HRRR S1 S2 Pulses are 2+ & equal. Abdomen: Bowel sounds are positive, abdomen soft and non-tender without masses Extremities:   No clubbing, cyanosis or edema.  DP +1 Neuro: Alert and oriented X 3. Psych:  Good affect, responds appropriately   LABS: Basic Metabolic Panel:  Recent Labs  08/29/14 0500 08/30/14 0500  NA 137 142  K 4.2 4.0  CL 100 101  CO2 29 29  GLUCOSE 104* 113*  BUN 44* 43*  CREATININE 1.60* 1.70*  CALCIUM 8.3* 8.2*   Liver Function Tests: No results found for this basename: AST, ALT, ALKPHOS, BILITOT, PROT, ALBUMIN,  in the last 72 hours No results found for this basename: LIPASE, AMYLASE,  in the last 72 hours CBC:  Recent Labs  08/30/14 0500 08/31/14 0708  WBC 19.7* 16.5*  HGB 9.7* 10.0*  HCT 29.9* 30.1*  MCV 88.2 87.8  PLT 457* 448*   Cardiac Enzymes: No results found for this basename: CKTOTAL, CKMB, CKMBINDEX, TROPONINI,  in the last 72 hours BNP: No components found with this basename: POCBNP,  D-Dimer: No results found for this basename: DDIMER,  in the last 72 hours Hemoglobin A1C: No results found for this basename: HGBA1C,  in the last 72 hours Fasting Lipid Panel: No results found  for this basename: CHOL, HDL, LDLCALC, TRIG, CHOLHDL, LDLDIRECT,  in the last 72 hours Thyroid Function Tests: No results found for this basename: TSH, T4TOTAL, FREET3, T3FREE, THYROIDAB,  in the last 72 hours Anemia Panel: No results found for this basename: VITAMINB12, FOLATE, FERRITIN, TIBC, IRON, RETICCTPCT,  in the last 72 hours Coag Panel:   Lab Results  Component Value Date   INR 0.97 08/13/2014   INR 0.91 09/06/2011    RADIOLOGY: Dg Chest 1 View  08/15/2014   CLINICAL DATA:  rapid response, sob, tachycardia  EXAM: CHEST - 1 VIEW  COMPARISON:  08/15/2014 at 0515 hr  FINDINGS: Interval development of moderate pulmonary edema with perihilar and basilar alveolar edema. Cardiopericardial silhouette partially obscured but grossly unchanged compared to prior. No pleural effusion. No pneumothorax. Old RIGHT distal clavicle fracture.  IMPRESSION: Interval development of moderate bilateral airspace disease consistent with pulmonary edema.   Electronically Signed   By: Dereck Ligas M.D.   On: 08/15/2014 19:58   Ct Abdomen Pelvis W Contrast  08/13/2014   CLINICAL DATA:  GI bleeding.  EXAM: CT ABDOMEN AND PELVIS WITH CONTRAST  TECHNIQUE: Multidetector CT imaging of the abdomen and pelvis was performed using the  standard protocol following bolus administration of intravenous contrast.  CONTRAST:  75mL OMNIPAQUE IOHEXOL 300 MG/ML SOLN, 181mL OMNIPAQUE IOHEXOL 300 MG/ML SOLN  COMPARISON:  None.  FINDINGS: The lung bases demonstrate a focal calcification over the post to medial right duct matter border. There is minimal linear scarring over the right base.  Abdominal images demonstrate several calcified splenic granulomas. There is mild cholelithiasis. The liver, pancreas and adrenal glands are within normal. Kidneys normal in size without hydronephrosis or focal mass. There are a couple small calcifications adjacent the left renal hilum likely vascular. Ureters are normal. The appendix is normal. There is  diverticulosis throughout the colon including the cecum. There is calcified plaque involving the abdominal aorta and iliac vessels. There is minimal dilatation of the infrarenal abdominal aorta measuring 2.7 cm in AP diameter.  Pelvic images demonstrate the bladder, prostate and rectum to be within normal. There is no free fluid or inflammatory change noted. Hardware is present over the left proximal femur. There are mild degenerative changes of the spine and hips.  IMPRESSION: No acute findings in the abdomen/pelvis.  Moderate diverticulosis throughout the colon without active inflammation.  Mild cholelithiasis.   Electronically Signed   By: Marin Olp M.D.   On: 08/13/2014 17:52   Dg Chest Port 1 View  08/23/2014   CLINICAL DATA:  Shortness of Breath  EXAM: PORTABLE CHEST - 1 VIEW  COMPARISON:  08/22/2014  FINDINGS: Cardiomediastinal silhouette is stable. Left IJ central line is unchanged in position. Persistent bilateral mild congestion/edema. Probable bilateral small pleural effusion with bilateral basilar atelectasis or infiltrate.  IMPRESSION: Persistent bilateral mild congestion/edema. Probable bilateral small pleural effusion with bilateral basilar atelectasis or infiltrate.   Electronically Signed   By: Lahoma Crocker M.D.   On: 08/23/2014 11:11   Dg Chest Port 1 View  08/22/2014   CLINICAL DATA:  Respiratory distress.  EXAM: PORTABLE CHEST - 1 VIEW  COMPARISON:  08/21/2014.  FINDINGS: Left IJ line in stable anatomic position. Mediastinum and hilar structures are stable. Cardiomegaly with pulmonary vascular prominence and diffuse bilateral pulmonary infiltrates noted consistent with congestive heart failure and pulmonary edema. Pulmonary edema has progressed from prior exam. Tiny pleural effusions cannot be excluded. There is no pneumothorax. No acute bony abnormality.  IMPRESSION: 1. Congestive heart failure with bilateral pulmonary edema. Pulmonary has progressed from prior exam. Small pleural  effusions cannot be excluded. 2. Left IJ line in stable position.   Electronically Signed   By: Marcello Moores  Register   On: 08/22/2014 10:05   Dg Chest Port 1 View  08/21/2014   CLINICAL DATA:  Respiratory failure.  EXAM: PORTABLE CHEST - 1 VIEW  COMPARISON:  Single view of the chest 08/20/2014 and 08/19/2014.  FINDINGS: Endotracheal tube and feeding tube have been removed. Left IJ catheter remains in place. Pulmonary edema continues to improve. Small effusions and basilar airspace disease, right worse than left, have also improved. Heart size is normal. No pneumothorax is identified. Remote right clavicle fracture is noted.  IMPRESSION: Continued improvement in edema, effusions and basilar atelectasis.   Electronically Signed   By: Inge Rise M.D.   On: 08/21/2014 07:14   Dg Chest Port 1 View  08/20/2014   CLINICAL DATA:  Followup respiratory failure  EXAM: PORTABLE CHEST - 1 VIEW  COMPARISON:  08/19/2014  FINDINGS: There is an endotracheal tube which ends just below the clavicular heads. Gastric suction tube crosses the diaphragm. Left IJ catheter is stable, with tip reaching the upper SVC.  Interstitial opacities in the upper lungs appear decreased, although blood flow remain cephalized. There is increasing hazy opacification of the right mid and lower chest and of the retrocardiac lung. No pneumothorax.  IMPRESSION: 1. Tubes and central line remain in good position. 2. Pulmonary venous congestion with increasing or layering pleural effusions.   Electronically Signed   By: Jorje Guild M.D.   On: 08/20/2014 07:29   Dg Chest Port 1 View  08/19/2014   CLINICAL DATA:  Endotracheal tube placement  EXAM: PORTABLE CHEST - 1 VIEW  COMPARISON:  08/19/2014  FINDINGS: Endotracheal tube tip approximately 12 mm proximal to the carina. NG tube descends below the level of the image. Left IJ catheter tip projects over the brachiocephalic/SVC confluence. Numerous other wires, tubes, and support devices are presumably  external. The lungs show interstitial and hazy airspace opacities. Small effusions suspected. No pneumothorax. No interval osseous change.  IMPRESSION: Endotracheal tube tip 1.2 cm proximal to the carina. Consider retracting 1-2 cm.  Bilateral interstitial and hazy airspace opacities may reflect pulmonary edema and/or multifocal infection.  Small effusions and associated airspace opacities ; atelectasis versus infiltrate.   Electronically Signed   By: Carlos Levering M.D.   On: 08/19/2014 02:55   Dg Chest Port 1 View  08/19/2014   CLINICAL DATA:  Respiratory distress  EXAM: PORTABLE CHEST - 1 VIEW  COMPARISON:  08/18/2014  FINDINGS: Left IJ central venous catheter tip projects over the brachiocephalic/ SVC confluence. Bilateral perihilar interstitial and hazy airspace opacities. More confluent right lung base opacity and small pleural effusions not excluded. No pneumothorax. Central vascular congestion. Heart size upper normal. Osteopenia and multilevel degenerative changes.  IMPRESSION: Central vascular congestion and peri-/infrahilar interstitial and airspace opacity may reflect pulmonary edema or multifocal infection.  More confluent right lung base opacity; atelectasis versus infiltrate. Small effusions not excluded.   Electronically Signed   By: Carlos Levering M.D.   On: 08/19/2014 01:50   Dg Chest Port 1 View  08/18/2014   CLINICAL DATA:  68 year old male. Intubated. GI bleeding, myocardial infarction, respiratory failure. Initial encounter.  EXAM: PORTABLE CHEST - 1 VIEW  COMPARISON:  08/17/2014 and earlier.  FINDINGS: Portable AP semi upright view at 0544 hrs. Stable endotracheal tube. Resuscitation pads in place. Enteric tube looped in the left upper quadrant. Stable left IJ central line. Stable cardiac size and mediastinal contours. No pneumothorax. Mildly regressed basilar predominant interstitial opacity. No large effusion. No definite consolidation. No areas of worsening ventilation.   IMPRESSION: 1.  Stable lines and tubes. 2. Mild regression of interstitial edema. Superimposed lung base atelectasis suspected.   Electronically Signed   By: Lars Pinks M.D.   On: 08/18/2014 07:33   Dg Chest Port 1 View  08/17/2014   CLINICAL DATA:  Check endotracheal tube placement  EXAM: PORTABLE CHEST - 1 VIEW  COMPARISON:  08/16/2014  FINDINGS: Cardiac shadow is stable. An endotracheal tube is again seen 5.5 cm above the carina. A nasogastric catheter is noted within the stomach as well as a left jugular line in the proximal superior vena cava. Patchy changes remain in the right lung base. Some clearing is noted in the right upper lobe. Mild increased atelectasis in the left lung base is noted. No pneumothorax is seen. No sizable effusion is noted.  IMPRESSION: Bibasilar changes right greater than left. The left-sided changes are new from the prior exam.   Electronically Signed   By: Inez Catalina M.D.   On: 08/17/2014 07:24  Dg Chest Port 1 View  08/16/2014   CLINICAL DATA:  Evaluate endotracheal tube  EXAM: PORTABLE CHEST - 1 VIEW  COMPARISON:  08/15/2014  FINDINGS: Endotracheal tube tip about 5.8 cm above the carina. Left central line in unchanged position. NG tube again crosses the gastroesophageal junction. Extensive infiltrate right middle and lower lobe stable. No significant findings on the left.  IMPRESSION: Endotracheal tube as described.  Persistent right-sided infiltrate.   Electronically Signed   By: Skipper Cliche M.D.   On: 08/16/2014 07:37   Dg Chest Port 1 View  08/15/2014   CLINICAL DATA:  Line and tube placement.  EXAM: PORTABLE CHEST - 1 VIEW  COMPARISON:  08/15/2014.  FINDINGS: Interval left jugular catheter with its tip in the proximal superior vena cava. No pneumothorax. Interval endotracheal tube in satisfactory position. Interval nasogastric tube with its tip in the proximal stomach and side hole in the distal esophagus. Normal sized heart. No significant change in diffuse  bilateral interstitial prominence and patchy airspace opacity in the right mid and lower lung zones and medial left lung base. Diffuse osteopenia.  IMPRESSION: 1. Tubes and catheter, as described above. 2. Stable changes of congestive heart failure.   Electronically Signed   By: Enrique Sack M.D.   On: 08/15/2014 22:04   Dg Chest Port 1 View  08/15/2014   CLINICAL DATA:  Chest pain  EXAM: PORTABLE CHEST - 1 VIEW  COMPARISON:  09/06/2011  FINDINGS: Small stable granuloma in the right upper lobe near the minor fissure. There is minor lung base linear/reticular opacity that is most likely atelectasis. Lungs are otherwise clear. No pleural effusion or pneumothorax.  Normal heart, mediastinum and hila.  Bony thorax is demineralized but grossly intact.  IMPRESSION: No active disease.   Electronically Signed   By: Lajean Manes M.D.   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 Acute combined systolic and diastolic HF (heart failure), NYHA class 4: In setting of non-STEMI; LVEDP 45 mmHg Cardiomyopathy, ischemic: Severe. EF 5 -10% by LV gram - following non-STEMI and ventricular tachycardia AKI (acute kidney injury) Anemia, unspecified  Plan:  1. NSVT improved, no further episodes. Continue amiodarone 400 mg BID x 1 week, then reduce to 400 mg daily.  2. Acute combined syst and diast CHF - No signs of fluid overload. Increasing Crea (1.57--> 1.7), lasix decreased to 40 mg po BID  and BMET pending this am.  Continue ACE I.  No BB secondary to COPD with active wheezing 3. COPD - Actively wheezing, on respiratory therapy and oral prednisone.  4. CAD, s/p NSTEMI - No more chest pain on ranolazine. Continue ASA/statin/Plavix   Sueanne Margarita, MD  08/31/2014  8:22 AM

## 2014-08-31 NOTE — Progress Notes (Signed)
CARDIAC REHAB PHASE I   PRE:  Rate/Rhythm: 75 sinus  BP:  Supine:   Sitting: 94/54  Standing:    SaO2: 96% 4L  MODE:  Ambulation: 180 ft   POST:  Rate/Rhythem: 92  BP:  Supine:   Sitting: 104/50  Standing:    SaO2: 95 4L  Pt ambulated 180 ft with assist x2 using rolling walker.  Pt was not excited to walk but felt better afterwards.  Took 3 standing rest breaks for tired/weak legs.  Pt encouraged to walk with staff over weekend.  We will f/u on Monday. Alberteen Sam, MA, ACSM RCEP   Luan Pulling, Nita Sells

## 2014-09-01 LAB — BASIC METABOLIC PANEL
Anion gap: 10 (ref 5–15)
BUN: 46 mg/dL — ABNORMAL HIGH (ref 6–23)
CALCIUM: 8.3 mg/dL — AB (ref 8.4–10.5)
CO2: 29 mEq/L (ref 19–32)
CREATININE: 1.7 mg/dL — AB (ref 0.50–1.35)
Chloride: 94 mEq/L — ABNORMAL LOW (ref 96–112)
GFR calc Af Amer: 45 mL/min — ABNORMAL LOW (ref >90)
GFR calc non Af Amer: 39 mL/min — ABNORMAL LOW (ref >90)
Glucose, Bld: 108 mg/dL — ABNORMAL HIGH (ref 70–99)
Potassium: 4.2 mEq/L (ref 3.7–5.3)
Sodium: 133 mEq/L — ABNORMAL LOW (ref 137–147)

## 2014-09-01 LAB — CBC
HEMATOCRIT: 31.1 % — AB (ref 39.0–52.0)
Hemoglobin: 10.3 g/dL — ABNORMAL LOW (ref 13.0–17.0)
MCH: 29 pg (ref 26.0–34.0)
MCHC: 33.1 g/dL (ref 30.0–36.0)
MCV: 87.6 fL (ref 78.0–100.0)
Platelets: 403 10*3/uL — ABNORMAL HIGH (ref 150–400)
RBC: 3.55 MIL/uL — AB (ref 4.22–5.81)
RDW: 14.9 % (ref 11.5–15.5)
WBC: 16.3 10*3/uL — ABNORMAL HIGH (ref 4.0–10.5)

## 2014-09-01 LAB — GLUCOSE, CAPILLARY
GLUCOSE-CAPILLARY: 140 mg/dL — AB (ref 70–99)
Glucose-Capillary: 109 mg/dL — ABNORMAL HIGH (ref 70–99)
Glucose-Capillary: 136 mg/dL — ABNORMAL HIGH (ref 70–99)
Glucose-Capillary: 138 mg/dL — ABNORMAL HIGH (ref 70–99)

## 2014-09-01 MED ORDER — PREDNISONE 20 MG PO TABS
30.0000 mg | ORAL_TABLET | Freq: Every day | ORAL | Status: DC
  Administered 2014-09-02 – 2014-09-03 (×2): 30 mg via ORAL
  Filled 2014-09-01 (×3): qty 1

## 2014-09-01 NOTE — Progress Notes (Signed)
PROGRESS NOTE  Douglas Mann JIR:[id number] DOB: 1946-04-02 DOA: 08/13/2014 PCP: [name], MD  68 year old male with 9 pack active smoker, full code with known CAD and s/p stent at [facility] > 10 years ago and copd with o2 dependency Admitted 08/13/14  with  a chief complaint of bleeding in his bowels and a swollen abdomen along with a story that sounded like mesentric angina (post prandial abd discmfrt and weight Loss and ability to handle only small amounts of food at at time). At admission he ruled out for MI. Then on evening of 08/15/14 he develped sudden onset chest pain, with sinus tachycardia with EKG change to duffise ST depression in inferior and lateral leads and respiratory distress with CXR suggestive of flash pulmonary edema. Despite lasix he developed 2 x episodes of V Tach with normal BP and needing IV amio (never needed cpr). Patient emergently intubated on [place] floor and moved to ICU at [facility].   8/30 he was extubated  Then on 8/31 early AM: re-intubated. Edema pattern on CXR. Milrinone initiated by Cardiology  9/01 Extubated. Remained slightly encephalopathic due to residual sedating medications. Discussed re-intubation status with pt (who did not seem to fully grasp the conversation) and his daughter who feels certain that he would not wish to undergo repeat intubation or ACLS in this situation where there are very limited treatment options and where recurrent respiratory fialure would be indicative of failure of those few options  Assessment/Plan: Acute on chronic systolic heart failure-  Per cardiology. improving  Severe oxygen requiring COPD - wears O2 at home and still smokes atrovent and xopenex due to arrhythmias. No wheeze today. Taper prednisone slowly. Completed course of antibiotics  Status post salvage LAD stent in the setting of cardiogenic shock and pulmonary edema   Ventricular tachycardia. Resolved on amiodarone  AKI Creatinine stable  Hematochezia,  resolved Suspect hemorroids. No plans for endoscopy. S/p pRBC  Chronic intermittent abd pain, concern for mesenteric ischemia  Consider CTA abdomen/pelvis when clinically stable per GI recs - renal status needs to be optimized No pain after eating  Anemia secondary to blood loss and critical illness hgb improving  Hyperglycemia SSI  Code Status: DNR Family Communication: friend at bedside Disposition Plan: d/c Monday AM   Consultants:  PCCM  Cards  pallaitive care  Procedures:  cath   HPI/Subjective: Walked yesterday and ready to walk  More today  Objective: Filed Vitals:   09/01/14 0543  BP: 100/60  Pulse: 66  Temp: 97.9 F (36.6 C)  Resp: 18    Intake/Output Summary (Last 24 hours) at 09/01/14 0855 Last data filed at 09/01/14 7510  Gross per 24 hour  Intake    560 ml  Output   1975 ml  Net  -1415 ml   Filed Weights   08/27/14 0400 08/28/14 0300 08/29/14 0300  Weight: 76.7 kg (169 lb 1.5 oz) 76 kg (167 lb 8.8 oz) 74.9 kg (165 lb 2 oz)    Exam:   General:  Pleasant/cooperative  Cardiovascular: rrr without MGR  Respiratory: decreased, mild exp wheezing  Abdomen: +BS, soft, NT, ND  Ext: SCDs. No CCE  Data Reviewed: Basic Metabolic Panel:  Recent Labs Lab 08/25/14 2304  08/28/14 0315 08/29/14 0500 08/30/14 0500 08/31/14 0708 09/01/14 0253  NA  --   < > 140 137 142 138 133*  K  --   < > 4.1 4.2 4.0 4.1 4.2  CL  --   < > 103 100 101 98  94*  CO2  --   < > 28 29 29 31 29   GLUCOSE  --   < > 115* 104* 113* 89 108*  BUN  --   < > 48* 44* 43* 43* 46*  CREATININE  --   < > 1.57* 1.60* 1.70* 1.70* 1.70*  CALCIUM  --   < > 7.9* 8.3* 8.2* 8.2* 8.3*  MG 2.3  --   --   --   --   --   --   < > = values in this interval not displayed. Liver Function Tests: No results found for this basename: AST, ALT, ALKPHOS, BILITOT, PROT, ALBUMIN,  in the last 168 hours No results found for this basename: LIPASE, AMYLASE,  in the last 168 hours No results  found for this basename: AMMONIA,  in the last 168 hours CBC:  Recent Labs Lab 08/26/14 0410 08/27/14 0400 08/28/14 0315 08/30/14 0500 08/31/14 0708 09/01/14 0253  WBC 11.3*  --   --  19.7* 16.5* 16.3*  NEUTROABS 10.4*  --   --   --   --   --   HGB 7.1* 8.6* 9.2* 9.7* 10.0* 10.3*  HCT 22.2* 25.8* 27.7* 29.9* 30.1* 31.1*  MCV 87.4  --   --  88.2 87.8 87.6  PLT 277  --   --  457* 448* 403*   Cardiac Enzymes: No results found for this basename: CKTOTAL, CKMB, CKMBINDEX, TROPONINI,  in the last 168 hours BNP (last 3 results)  Recent Labs  08/20/14 0500 08/22/14 1150 08/29/14 0448  PROBNP 51736.0* 43395.0* 16284.0*   CBG:  Recent Labs Lab 08/31/14 0621 08/31/14 1119 08/31/14 1617 08/31/14 2109 09/01/14 0620  GLUCAP 85 139* 135* 115* 109*    No results found for this or any previous visit (from the past 240 hour(s)).   Studies: No results found.  Scheduled Meds: . amiodarone  400 mg Oral BID  . aspirin  81 mg Oral Daily  . atorvastatin  80 mg Oral q1800  . budesonide (PULMICORT) nebulizer solution  0.5 mg Nebulization BID  . clopidogrel  75 mg Oral Daily  . docusate sodium  100 mg Oral BID  . fluticasone  2 spray Each Nare Daily  . furosemide  40 mg Oral BID  . hydrocortisone   Rectal BID  . insulin aspart  0-15 Units Subcutaneous TID WC  . ipratropium  0.5 mg Nebulization BID  . levalbuterol  0.63 mg Nebulization BID  . lisinopril  10 mg Oral Daily  . mirtazapine  15 mg Oral QHS  . predniSONE  40 mg Oral Q breakfast  . ranolazine  500 mg Oral BID  . sodium chloride  3 mL Intravenous Q12H  . sodium chloride  3 mL Intravenous Q12H  . triamcinolone cream   Topical BID   Continuous Infusions: . sodium chloride Stopped (08/26/14 1900)   Antibiotics Given (last 72 hours)   Date/Time Action Medication Dose   08/29/14 1016 Given   levofloxacin (LEVAQUIN) tablet 750 mg 750 mg      Time spent: 15 min  VANN, JESSICA, DO [facility] Pager  [phone]. If 7PM-7AM, please contact night-coverage at [email or web address], password [facility] 09/01/2014, 8:55 AM  LOS: 19 days

## 2014-09-01 NOTE — Progress Notes (Signed)
SUBJECTIVE:  No complaints  OBJECTIVE:   Vitals:   Filed Vitals:   08/31/14 2025 08/31/14 2145 09/01/14 0543 09/01/14 0759  BP: 95/55  100/60   Pulse: 74  66   Temp: 98.4 F (36.9 C)  97.9 F (36.6 C)   TempSrc: Oral  Oral   Resp: 18  18   Height:      Weight:      SpO2: 99% 99% 98% 98%   I&O's:   Intake/Output Summary (Last 24 hours) at 09/01/14 7353 Last data filed at 09/01/14 2992  Gross per 24 hour  Intake    560 ml  Output   1975 ml  Net  -1415 ml   TELEMETRY: Reviewed telemetry pt in NSR:     PHYSICAL EXAM General: Well developed, well nourished, in no acute distress Head: Eyes PERRLA, No xanthomas.   Normal cephalic and atramatic  Lungs:   Clear bilaterally to auscultation and percussion. Heart:   HRRR S1 S2 Pulses are 2+ & equal. Abdomen: Bowel sounds are positive, abdomen soft and non-tender without masses  Extremities:   No clubbing, cyanosis or edema.  DP +1 Neuro: Alert and oriented X 3. Psych:  Good affect, responds appropriately   LABS: Basic Metabolic Panel:  Recent Labs  08/31/14 0708 09/01/14 0253  NA 138 133*  K 4.1 4.2  CL 98 94*  CO2 31 29  GLUCOSE 89 108*  BUN 43* 46*  CREATININE 1.70* 1.70*  CALCIUM 8.2* 8.3*   Liver Function Tests: No results found for this basename: AST, ALT, ALKPHOS, BILITOT, PROT, ALBUMIN,  in the last 72 hours No results found for this basename: LIPASE, AMYLASE,  in the last 72 hours CBC:  Recent Labs  08/31/14 0708 09/01/14 0253  WBC 16.5* 16.3*  HGB 10.0* 10.3*  HCT 30.1* 31.1*  MCV 87.8 87.6  PLT 448* 403*   Cardiac Enzymes: No results found for this basename: CKTOTAL, CKMB, CKMBINDEX, TROPONINI,  in the last 72 hours BNP: No components found with this basename: POCBNP,  D-Dimer: No results found for this basename: DDIMER,  in the last 72 hours Hemoglobin A1C: No results found for this basename: HGBA1C,  in the last 72 hours Fasting Lipid Panel: No results found for this basename: CHOL,  HDL, LDLCALC, TRIG, CHOLHDL, LDLDIRECT,  in the last 72 hours Thyroid Function Tests: No results found for this basename: TSH, T4TOTAL, FREET3, T3FREE, THYROIDAB,  in the last 72 hours Anemia Panel: No results found for this basename: VITAMINB12, FOLATE, FERRITIN, TIBC, IRON, RETICCTPCT,  in the last 72 hours Coag Panel:   Lab Results  Component Value Date   INR 0.97 08/13/2014   INR 0.91 09/06/2011    RADIOLOGY: Dg Chest 1 View  08/15/2014   CLINICAL DATA:  rapid response, sob, tachycardia  EXAM: CHEST - 1 VIEW  COMPARISON:  08/15/2014 at 0515 hr  FINDINGS: Interval development of moderate pulmonary edema with perihilar and basilar alveolar edema. Cardiopericardial silhouette partially obscured but grossly unchanged compared to prior. No pleural effusion. No pneumothorax. Old RIGHT distal clavicle fracture.  IMPRESSION: Interval development of moderate bilateral airspace disease consistent with pulmonary edema.   Electronically Signed   By: Dereck Ligas M.D.   On: 08/15/2014 19:58   Ct Abdomen Pelvis W Contrast  08/13/2014   CLINICAL DATA:  GI bleeding.  EXAM: CT ABDOMEN AND PELVIS WITH CONTRAST  TECHNIQUE: Multidetector CT imaging of the abdomen and pelvis was performed using the standard protocol following bolus administration of  intravenous contrast.  CONTRAST:  56mL OMNIPAQUE IOHEXOL 300 MG/ML SOLN, 132mL OMNIPAQUE IOHEXOL 300 MG/ML SOLN  COMPARISON:  None.  FINDINGS: The lung bases demonstrate a focal calcification over the post to medial right duct matter border. There is minimal linear scarring over the right base.  Abdominal images demonstrate several calcified splenic granulomas. There is mild cholelithiasis. The liver, pancreas and adrenal glands are within normal. Kidneys normal in size without hydronephrosis or focal mass. There are a couple small calcifications adjacent the left renal hilum likely vascular. Ureters are normal. The appendix is normal. There is diverticulosis throughout  the colon including the cecum. There is calcified plaque involving the abdominal aorta and iliac vessels. There is minimal dilatation of the infrarenal abdominal aorta measuring 2.7 cm in AP diameter.  Pelvic images demonstrate the bladder, prostate and rectum to be within normal. There is no free fluid or inflammatory change noted. Hardware is present over the left proximal femur. There are mild degenerative changes of the spine and hips.  IMPRESSION: No acute findings in the abdomen/pelvis.  Moderate diverticulosis throughout the colon without active inflammation.  Mild cholelithiasis.   Electronically Signed   By: Marin Olp M.D.   On: 08/13/2014 17:52   Dg Chest Port 1 View  08/23/2014   CLINICAL DATA:  Shortness of Breath  EXAM: PORTABLE CHEST - 1 VIEW  COMPARISON:  08/22/2014  FINDINGS: Cardiomediastinal silhouette is stable. Left IJ central line is unchanged in position. Persistent bilateral mild congestion/edema. Probable bilateral small pleural effusion with bilateral basilar atelectasis or infiltrate.  IMPRESSION: Persistent bilateral mild congestion/edema. Probable bilateral small pleural effusion with bilateral basilar atelectasis or infiltrate.   Electronically Signed   By: Lahoma Crocker M.D.   On: 08/23/2014 11:11   Dg Chest Port 1 View  08/22/2014   CLINICAL DATA:  Respiratory distress.  EXAM: PORTABLE CHEST - 1 VIEW  COMPARISON:  08/21/2014.  FINDINGS: Left IJ line in stable anatomic position. Mediastinum and hilar structures are stable. Cardiomegaly with pulmonary vascular prominence and diffuse bilateral pulmonary infiltrates noted consistent with congestive heart failure and pulmonary edema. Pulmonary edema has progressed from prior exam. Tiny pleural effusions cannot be excluded. There is no pneumothorax. No acute bony abnormality.  IMPRESSION: 1. Congestive heart failure with bilateral pulmonary edema. Pulmonary has progressed from prior exam. Small pleural effusions cannot be excluded. 2.  Left IJ line in stable position.   Electronically Signed   By: Marcello Moores  Register   On: 08/22/2014 10:05   Dg Chest Port 1 View  08/21/2014   CLINICAL DATA:  Respiratory failure.  EXAM: PORTABLE CHEST - 1 VIEW  COMPARISON:  Single view of the chest 08/20/2014 and 08/19/2014.  FINDINGS: Endotracheal tube and feeding tube have been removed. Left IJ catheter remains in place. Pulmonary edema continues to improve. Small effusions and basilar airspace disease, right worse than left, have also improved. Heart size is normal. No pneumothorax is identified. Remote right clavicle fracture is noted.  IMPRESSION: Continued improvement in edema, effusions and basilar atelectasis.   Electronically Signed   By: Inge Rise M.D.   On: 08/21/2014 07:14   Dg Chest Port 1 View  08/20/2014   CLINICAL DATA:  Followup respiratory failure  EXAM: PORTABLE CHEST - 1 VIEW  COMPARISON:  08/19/2014  FINDINGS: There is an endotracheal tube which ends just below the clavicular heads. Gastric suction tube crosses the diaphragm. Left IJ catheter is stable, with tip reaching the upper SVC.  Interstitial opacities in the upper  lungs appear decreased, although blood flow remain cephalized. There is increasing hazy opacification of the right mid and lower chest and of the retrocardiac lung. No pneumothorax.  IMPRESSION: 1. Tubes and central line remain in good position. 2. Pulmonary venous congestion with increasing or layering pleural effusions.   Electronically Signed   By: Jorje Guild M.D.   On: 08/20/2014 07:29   Dg Chest Port 1 View  08/19/2014   CLINICAL DATA:  Endotracheal tube placement  EXAM: PORTABLE CHEST - 1 VIEW  COMPARISON:  08/19/2014  FINDINGS: Endotracheal tube tip approximately 12 mm proximal to the carina. NG tube descends below the level of the image. Left IJ catheter tip projects over the brachiocephalic/SVC confluence. Numerous other wires, tubes, and support devices are presumably external. The lungs show  interstitial and hazy airspace opacities. Small effusions suspected. No pneumothorax. No interval osseous change.  IMPRESSION: Endotracheal tube tip 1.2 cm proximal to the carina. Consider retracting 1-2 cm.  Bilateral interstitial and hazy airspace opacities may reflect pulmonary edema and/or multifocal infection.  Small effusions and associated airspace opacities ; atelectasis versus infiltrate.   Electronically Signed   By: Carlos Levering M.D.   On: 08/19/2014 02:55   Dg Chest Port 1 View  08/19/2014   CLINICAL DATA:  Respiratory distress  EXAM: PORTABLE CHEST - 1 VIEW  COMPARISON:  08/18/2014  FINDINGS: Left IJ central venous catheter tip projects over the brachiocephalic/ SVC confluence. Bilateral perihilar interstitial and hazy airspace opacities. More confluent right lung base opacity and small pleural effusions not excluded. No pneumothorax. Central vascular congestion. Heart size upper normal. Osteopenia and multilevel degenerative changes.  IMPRESSION: Central vascular congestion and peri-/infrahilar interstitial and airspace opacity may reflect pulmonary edema or multifocal infection.  More confluent right lung base opacity; atelectasis versus infiltrate. Small effusions not excluded.   Electronically Signed   By: Carlos Levering M.D.   On: 08/19/2014 01:50   Dg Chest Port 1 View  08/18/2014   CLINICAL DATA:  68 year old male. Intubated. GI bleeding, myocardial infarction, respiratory failure. Initial encounter.  EXAM: PORTABLE CHEST - 1 VIEW  COMPARISON:  08/17/2014 and earlier.  FINDINGS: Portable AP semi upright view at 0544 hrs. Stable endotracheal tube. Resuscitation pads in place. Enteric tube looped in the left upper quadrant. Stable left IJ central line. Stable cardiac size and mediastinal contours. No pneumothorax. Mildly regressed basilar predominant interstitial opacity. No large effusion. No definite consolidation. No areas of worsening ventilation.  IMPRESSION: 1.  Stable lines and  tubes. 2. Mild regression of interstitial edema. Superimposed lung base atelectasis suspected.   Electronically Signed   By: Lars Pinks M.D.   On: 08/18/2014 07:33   Dg Chest Port 1 View  08/17/2014   CLINICAL DATA:  Check endotracheal tube placement  EXAM: PORTABLE CHEST - 1 VIEW  COMPARISON:  08/16/2014  FINDINGS: Cardiac shadow is stable. An endotracheal tube is again seen 5.5 cm above the carina. A nasogastric catheter is noted within the stomach as well as a left jugular line in the proximal superior vena cava. Patchy changes remain in the right lung base. Some clearing is noted in the right upper lobe. Mild increased atelectasis in the left lung base is noted. No pneumothorax is seen. No sizable effusion is noted.  IMPRESSION: Bibasilar changes right greater than left. The left-sided changes are new from the prior exam.   Electronically Signed   By: Inez Catalina M.D.   On: 08/17/2014 07:24   Dg Chest [facility] 1 [address]  08/16/2014   CLINICAL DATA:  Evaluate endotracheal tube  EXAM: PORTABLE CHEST - 1 VIEW  COMPARISON:  08/15/2014  FINDINGS: Endotracheal tube tip about 5.8 cm above the carina. Left central line in unchanged position. NG tube again crosses the gastroesophageal junction. Extensive infiltrate right middle and lower lobe stable. No significant findings on the left.  IMPRESSION: Endotracheal tube as described.  Persistent right-sided infiltrate.   Electronically Signed   By: Skipper Cliche M.D.   On: 08/16/2014 07:37   Dg Chest Port 1 View  08/15/2014   CLINICAL DATA:  Line and tube placement.  EXAM: PORTABLE CHEST - 1 VIEW  COMPARISON:  08/15/2014.  FINDINGS: Interval left jugular catheter with its tip in the proximal superior vena cava. No pneumothorax. Interval endotracheal tube in satisfactory position. Interval nasogastric tube with its tip in the proximal stomach and side hole in the distal esophagus. Normal sized heart. No significant change in diffuse bilateral interstitial prominence and  patchy airspace opacity in the right mid and lower lung zones and medial left lung base. Diffuse osteopenia.  IMPRESSION: 1. Tubes and catheter, as described above. 2. Stable changes of congestive heart failure.   Electronically Signed   By: Enrique Sack M.D.   On: 08/15/2014 22:04   Dg Chest Port 1 View  08/15/2014   CLINICAL DATA:  Chest pain  EXAM: PORTABLE CHEST - 1 VIEW  COMPARISON:  09/06/2011  FINDINGS: Small stable granuloma in the right upper lobe near the minor fissure. There is minor lung base linear/reticular opacity that is most likely atelectasis. Lungs are otherwise clear. No pleural effusion or pneumothorax.  Normal heart, mediastinum and hila.  Bony thorax is demineralized but grossly intact.  IMPRESSION: No active disease.   Electronically Signed   By: Lajean Manes M.D.   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 EF 5 -10% by LV gram - following non-STEMI and ventricular tachycardia  AKI (acute kidney injury)  Anemia, unspecified   Plan:  1. NSVT improved, no further episodes. Continue amiodarone 400 mg BID x 1 week, then reduce to 400 mg daily.  2. Acute combined syst and diast CHF - No signs of fluid overload. Increasing Crea (1.57--> 1.7), lasix decreased to 40 mg po BID  and creatinine stable. Continue ACE I. No BB secondary to COPD with active wheezing  3. COPD - Actively wheezing, on respiratory therapy and oral prednisone.  4. CAD, s/p NSTEMI - No more chest pain on ranolazine. Continue ASA/statin/Plavix  5.  Ischemic DCM with severe LV dysfunction - he will need to go home on a Life vest for 3 months until EF reassessed and if not improved will need AICD placed - we will arrange this prior to discharge.  I had a long discussion today regarding the Life Vest and he understands.    Sueanne Margarita,  MD  09/01/2014  9:07 AM

## 2014-09-02 LAB — BASIC METABOLIC PANEL
Anion gap: 10 (ref 5–15)
BUN: 41 mg/dL — ABNORMAL HIGH (ref 6–23)
CALCIUM: 8.5 mg/dL (ref 8.4–10.5)
CHLORIDE: 95 meq/L — AB (ref 96–112)
CO2: 31 mEq/L (ref 19–32)
CREATININE: 1.71 mg/dL — AB (ref 0.50–1.35)
GFR calc non Af Amer: 39 mL/min — ABNORMAL LOW (ref >90)
GFR, EST AFRICAN AMERICAN: 45 mL/min — AB (ref >90)
Glucose, Bld: 109 mg/dL — ABNORMAL HIGH (ref 70–99)
Potassium: 3.9 mEq/L (ref 3.7–5.3)
Sodium: 136 mEq/L — ABNORMAL LOW (ref 137–147)

## 2014-09-02 LAB — GLUCOSE, CAPILLARY
Glucose-Capillary: 93 mg/dL (ref 70–99)
Glucose-Capillary: 134 mg/dL — ABNORMAL HIGH (ref 70–99)
Glucose-Capillary: 136 mg/dL — ABNORMAL HIGH (ref 70–99)
Glucose-Capillary: 116 mg/dL — ABNORMAL HIGH (ref 70–99)

## 2014-09-02 MED ORDER — LISINOPRIL 2.5 MG PO TABS
2.5000 mg | ORAL_TABLET | Freq: Every day | ORAL | Status: DC
  Administered 2014-09-02 – 2014-09-03 (×2): 2.5 mg via ORAL
  Filled 2014-09-02 (×3): qty 1

## 2014-09-02 MED ORDER — PREDNISONE 10 MG PO TABS: ORAL_TABLET | ORAL | Status: DC

## 2014-09-02 MED ORDER — FUROSEMIDE 40 MG PO TABS: 40.0000 mg | ORAL_TABLET | Freq: Two times a day (BID) | ORAL | Status: DC

## 2014-09-02 MED ORDER — LEVALBUTEROL HCL 0.63 MG/3ML IN NEBU: 0.6300 mg | INHALATION_SOLUTION | RESPIRATORY_TRACT | Status: DC | PRN

## 2014-09-02 MED ORDER — RANOLAZINE ER 500 MG PO TB12: 500.0000 mg | ORAL_TABLET | Freq: Two times a day (BID) | ORAL | Status: DC

## 2014-09-02 MED ORDER — LISINOPRIL 2.5 MG PO TABS: 2.5000 mg | ORAL_TABLET | Freq: Every day | ORAL | Status: DC

## 2014-09-02 MED ORDER — AMIODARONE HCL 400 MG PO TABS: ORAL_TABLET | ORAL | Status: DC

## 2014-09-02 NOTE — Progress Notes (Addendum)
CARDIAC REHAB PHASE I   PRE:  Rate/Rhythm: 73 SR  BP:  Supine: 92/50  Sitting: 80/44 recheck after sitting 80/40  Standing:    SaO2: 98 4L  MODE:  Ambulation: 80 ft   POST:  Rate/Rhythm: 92  BP:  Supine:   Sitting: 80/50  Standing:    SaO2: 95 4L 7680-8811 On arrival pt in bed without c/o. States that he is feeling better today after having BM yesterday. BP lower with sitting and states that he feels dizzy with sitting. Had pt to sit and rechecked BP unchanged. He stood and felt no different. Assisted X 1 used walker and O2 4L to ambulate. He was able to walk 80 feet, only c/o way of muscles in his legs feeling weak. He took several standing rest stops due to the leg discomfort. Pt to recliner after walk with call light in reach. Pt buttock very irritated, sore and skin peeling off. Washed pt prior to walk and applied lotion. Reported pt's BP and skin irritation to RN. Placed pillow in recliner for pt to sit on. Started MI and stent education with pt. I gave him MI booklet and stent card. We also discussed CHF and I gave him CHF packet. Pt get meals on wheels at home M-F and on the weekends eats out. I stressed the important's of daily weights and watching sodium intake.I doubt that he will be able to control his sodium intake with his lifestyle. No c/o of dizziness walking.  Rodney Langton RN 09/02/2014 9:31 AM

## 2014-09-02 NOTE — Progress Notes (Signed)
Advanced Heart Failure Rounding Note   Subjective:    Mr. Bottger is a 68 yo male with a history of COPD and wears O2 24/7, CAD, PVD and HTN.  Patient presented to the ED on 08/13/14 with CP. He became tachypneic, hypoxic and diaphoretic and was intubated. Troponins positive and ST depression and on 08/16/14 underwent LHC revealing severe 2 V CAD with 100% occluded RCA filled via collaterals and prox LAD 90% stenosis with pre/post stenosis ectasia. EF down to 5-10% and marked LVEDP 45. He underwent high risk PCI to LAD.  ECHO (08/15/14): EF 55-60% ECHO (08/16/14): EF 30-35%, grade I DD   Was reintubated after IABP removed and extubated again 9/1. Started on milrinone and IV lasix. Had frequent NSVT and ranexa was added to amiodarone and LifeVest ordered.   Weight down 2 lbs and 24 hr I/O -500 cc. Renal function remains elevated 1.7 (baseline 1.0). Very weak. +orthopnea. Denies SOB.   Objective:   Weight Range:  Vital Signs:   Temp:  [97.5 F (36.4 C)-98.1 F (36.7 C)] 98.1 F (36.7 C) (09/14 0645) Pulse Rate:  [69-87] 69 (09/14 0645) Resp:  [18] 18 (09/13 2023) BP: (90-93)/(56-57) 93/56 mmHg (09/14 0645) SpO2:  [98 %-99 %] 99 % (09/14 0645) Last BM Date: 09/01/14  Weight change: Filed Weights   08/27/14 0400 08/28/14 0300 08/29/14 0300  Weight: 169 lb 1.5 oz (76.7 kg) 167 lb 8.8 oz (76 kg) 165 lb 2 oz (74.9 kg)    Intake/Output:   Intake/Output Summary (Last 24 hours) at 09/02/14 1155 Last data filed at 09/02/14 0900  Gross per 24 hour  Intake    600 ml  Output   1326 ml  Net   -726 ml     Physical Exam: General: Elderly appearing. No resp difficulty, sitting upright in chair HEENT: normal Neck: supple. JVP difficult to assess but appears 8 . Carotids 2+ bilat; no bruits. No lymphadenopathy or thryomegaly appreciated. Cor: PMI nondisplaced. Regular rate & rhythm. No rubs, gallops or murmurs. Lungs: clear Abdomen: soft, nontender, mildly distended. No hepatosplenomegaly.  No bruits or masses. Good bowel sounds. Extremities: no cyanosis, clubbing, rash, edema, cool extremities.  Neuro: alert & orientedx3, cranial nerves grossly intact. moves all 4 extremities w/o difficulty. Affect pleasant  Telemetry: SR 70s  Labs: Basic Metabolic Panel:  Recent Labs Lab 08/29/14 0500 08/30/14 0500 08/31/14 0708 09/01/14 0253 09/02/14 0700  NA 137 142 138 133* 136*  K 4.2 4.0 4.1 4.2 3.9  CL 100 101 98 94* 95*  CO2 29 29 31 29 31   GLUCOSE 104* 113* 89 108* 109*  BUN 44* 43* 43* 46* 41*  CREATININE 1.60* 1.70* 1.70* 1.70* 1.71*  CALCIUM 8.3* 8.2* 8.2* 8.3* 8.5    Liver Function Tests: No results found for this basename: AST, ALT, ALKPHOS, BILITOT, PROT, ALBUMIN,  in the last 168 hours No results found for this basename: LIPASE, AMYLASE,  in the last 168 hours No results found for this basename: AMMONIA,  in the last 168 hours  CBC:  Recent Labs Lab 08/27/14 0400 08/28/14 0315 08/30/14 0500 08/31/14 0708 09/01/14 0253  WBC  --   --  19.7* 16.5* 16.3*  HGB 8.6* 9.2* 9.7* 10.0* 10.3*  HCT 25.8* 27.7* 29.9* 30.1* 31.1*  MCV  --   --  88.2 87.8 87.6  PLT  --   --  457* 448* 403*    Cardiac Enzymes: No results found for this basename: CKTOTAL, CKMB, CKMBINDEX, TROPONINI,  in the  last 168 hours  BNP: BNP (last 3 results)  Recent Labs  08/20/14 0500 08/22/14 1150 08/29/14 0448  PROBNP 51736.0* 43395.0* 16284.0*     Other results:  EKG:   Imaging:  No results found.   Medications:     Scheduled Medications: . amiodarone  400 mg Oral BID  . aspirin  81 mg Oral Daily  . atorvastatin  80 mg Oral q1800  . budesonide (PULMICORT) nebulizer solution  0.5 mg Nebulization BID  . clopidogrel  75 mg Oral Daily  . docusate sodium  100 mg Oral BID  . fluticasone  2 spray Each Nare Daily  . furosemide  40 mg Oral BID  . hydrocortisone   Rectal BID  . insulin aspart  0-15 Units Subcutaneous TID WC  . ipratropium  0.5 mg Nebulization BID  .  levalbuterol  0.63 mg Nebulization BID  . lisinopril  2.5 mg Oral Daily  . mirtazapine  15 mg Oral QHS  . predniSONE  30 mg Oral Q breakfast  . ranolazine  500 mg Oral BID  . sodium chloride  3 mL Intravenous Q12H  . sodium chloride  3 mL Intravenous Q12H  . triamcinolone cream   Topical BID     Infusions: . sodium chloride Stopped (08/26/14 1900)     PRN Medications:  sodium chloride, guaiFENesin, levalbuterol, LORazepam, oxyCODONE, senna, simethicone, sodium chloride   Assessment:   1) NTEMI - s/p high risk PCI to LAD (3/66/29) 2) A/C systolic HF 3) ICM 4) NSVT - on amiodarone and ranexa 5) AKI 6) GI bleed 7) HTN 8) COPD 9) Acute respiratory failure, resolved  10) Cardiogenic shock  11) Anemia 12) 2V CAD  Plan/Discussion:    Mr. Laredo was asked to be seen by general cardiology for high risk of readmission. He presented to the hospital with SOB and CP. Found to have severe 2V CAD and underwent high risk PCI to LAD.   EF dropped from 55% to 35% and was started on milrinone for cardiogenic shock. His volume status does not appear too much above baseline, however difficult to assess and renal function remains elevated. He does have cool extremities and pro-BNP remains elevated from 9/10. Consider RHC to assess hemodynamics and cardiac output. Concerned he may still have some component of low-output.   Not on BB with recent cardiogenic shock. SBPs 90s will not titrate ACE-I.   Remains very weak and patient does not agree to SNF placement. Will need home HHRN/PT.   Length of Stay: 20 Rande Brunt NP-C 09/02/2014, 11:55 AM  Advanced Heart Failure Team Pager [phone] (M-F; 7a - 4p)  Please contact [facility] for night-coverage after hours (4p -7a ) and weekends on [email or web address]  Patient seen and examined with Junie Bame, NP. We discussed all aspects of the encounter. I agree with the assessment and plan as stated above.   Mr. Lamagna remains very weak after his  prolonged hospitalization and has been only able to ambulate 30-35 feet due to severe fatigue. The question has been raised whether this is related to his protracted hospital course or if he continues to have a component of low output HF. I think both are possible. However on exam I do not hear an S3 and extremities are well perfused this evening. I discussed with him the possibility of doing a RHC prior to d/c or going home without this and just having close f/u in the HF clinic. He would very much prefer to avoid another procedure  and I think this is reasonable.   We will plan on letting him go home tomorrow with [facility] following closely with HHRN/PT. We will also follow closely in HF Clinic.   Benay Spice 7:49 PM

## 2014-09-02 NOTE — Discharge Summary (Signed)
Physician Discharge Summary  Douglas Mann [name]:[id number] DOB: [birth year] DOA: 08/13/2014  PCP: [name], MD  Admit date: 08/13/2014 Discharge date: 09/03/2014  Time spent: 35  minutes  Recommendations for Outpatient Follow-up:  1. Home O2 2. Life vest 3. Home health- refused SNF 4. 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 EF 5 -10% by LV gram - following non-STEMI and ventricular tachycardia   AKI (acute kidney injury)   Anemia, unspecified   Discharge Condition: improved- poor overall prognosis  Diet recommendation: cardiac/diabetic  Filed Weights   08/27/14 0400 08/28/14 0300 08/29/14 0300  Weight: 76.7 kg (169 lb 1.5 oz) 76 kg (167 lb 8.8 oz) 74.9 kg (165 lb 2 oz)    History of present illness:  68 year old male with 43 pack active smoker, full code with known CAD and s/p stent at [facility] > 10 years ago and copd with o2 dependency Admitted 08/13/14 with a chief complaint of bleeding in his bowels and a swollen abdomen along with a story that sounded like mesentric angina (post prandial abd discmfrt and weight Loss and ability to handle only small amounts of  food at at time). At admission he ruled out for MI. Then on evening of 08/15/14 he develped sudden onset chest pain, with sinus tachycardia with EKG change to duffise ST depression in inferior and lateral leads and respiratory distress with CXR suggestive of flash pulmonary edema. Despite lasix he developed 2 x episodes of V Tach with normal BP and needing IV amio (never needed cpr). Patient emergently intubated on [facility] floor and moved to ICU at [facility].  8/30 he was extubated Then on 8/31 early AM: re-intubated. Edema pattern on CXR. Milrinone initiated by Cardiology  9/01 Extubated. Remained slightly encephalopathic due to residual sedating medications. Discussed re-intubation status with pt (who did not seem to fully grasp the conversation) and his daughter who feels certain that he would not wish to undergo repeat intubation or ACLS in this situation where there are very limited treatment options and where recurrent respiratory fialure would be indicative of failure of those few options    Hospital Course:  Ischemic cardiomyopathy  - Appreciate heart failure team consultation and followup in outpatient setting.  - He is at high risk for readmission  - Ejection fraction during cardiac catheterization reported 5-10% however echocardiogram on 8/28 demonstrated EF of 30-35%.  - On low-dose lisinopril 2.5 mg once a day for afterload reduction. Difficult to titrate upward because of hypotension.  - Not currently on beta blocker because of hypotension.  - On Ranexa because of refractory angina. Improved chest discomfort.  - Lasix currently 40 mg twice daily. Creatinine improved  from 1.7 down to 1.6. Continue with current dose.  - He did not wish to pursue right heart catheterization currently. This is understandable. He will have close followup in heart failure clinic.  - Life vest.    Acute systolic heart failure  - As above.   Coronary artery disease/non-ST elevation myocardial infarction  - LAD  PCI.  - Dual antiplatelet therapy for one year. Statin.    Deconditioning  - Home health PT.  -refused SNF  Nonsustained ventricular tachycardia  - Continue amiodarone 400 mg twice a day for 4 more days then change to 400 mg daily.  Chronic intermittent abd pain, concern for mesenteric ischemia  Consider CTA abdomen/pelvis when clinically stable per GI recs - renal status needs to be optimized  No pain after eating   Procedures:  cath  Consultations:  Cardiology  PCCM  Discharge Exam: Filed Vitals:   09/03/14 0552  BP: 99/63  Pulse: 67  Temp: 98.2 F (36.8 C)  Resp: 19    General: A+Ox3, NAD Cardiovascular: rrr Respiratory: clear  Discharge Instructions You were cared for by a hospitalist during your hospital stay. If you have any questions about your discharge medications or the care you received while you were in the hospital after you are discharged, you can call the unit and asked to speak with the hospitalist on call if the hospitalist that took care of you is not available. Once you are discharged, your primary care physician will handle any further medical issues. Please note that NO REFILLS for any discharge medications will be authorized once you are discharged, as it is imperative that you return to your primary care physician (or establish a relationship with a primary care physician if you do not have one) for your aftercare needs so that they can reassess your need for medications and monitor your lab values.  Discharge Instructions   (HEART FAILURE PATIENTS) Call MD:  Anytime you have any of the following symptoms: 1) 3 pound weight gain in 24 hours or 5 pounds in 1 week 2) shortness of breath, with or without a dry hacking cough 3) swelling in the hands, feet or stomach 4) if you have to sleep on extra pillows at night in order to breathe.    Complete by:  As directed      Diet - low sodium heart healthy    Complete by:  As directed      Diet Carb  Modified    Complete by:  As directed      Discharge instructions    Complete by:  As directed   Home health Cbc, bmp 1 week Home O2- 4L     Increase activity slowly    Complete by:  As directed           Current Discharge Medication List    START taking these medications   Details  amiodarone (PACERONE) 400 MG tablet 400 mg BID x 1 week then daily Qty: 37 tablet, Refills: 0    furosemide (LASIX) 40 MG tablet Take 1 tablet (40 mg total) by mouth 2 (two) times daily. Qty: 60 tablet, Refills: 0    levalbuterol (XOPENEX) 0.63 MG/3ML nebulizer solution Take 3 mLs (0.63 mg total) by nebulization every 2 (two) hours as needed for wheezing or shortness of breath. Qty: 3 mL, Refills: 12    predniSONE (DELTASONE) 10 MG tablet 30 mg daily x 2 days then 20 mg day x 2 days, 10 mg daily x 2 days  Qty: 12 tablet, Refills: 0    ranolazine (RANEXA) 500 MG 12 hr tablet Take 1 tablet (500 mg total) by mouth 2 (two) times daily. Qty: 60 tablet, Refills: 0      CONTINUE these medications which have CHANGED   Details  lisinopril (PRINIVIL,ZESTRIL) 2.5 MG tablet Take 1 tablet (2.5 mg total) by mouth daily. Qty: 30 tablet, Refills: 0      CONTINUE these medications which have NOT CHANGED   Details  albuterol (VENTOLIN HFA) 108 (90 BASE) MCG/ACT inhaler Inhale 2 puffs into the lungs every 6 (six) hours as needed. Qty: 3 Inhaler, Refills: 3    aspirin 81 MG tablet Take 81 mg by mouth daily.      clopidogrel (PLAVIX) 75 MG tablet take 1 tablet by mouth once daily Qty: 90 tablet, Refills: 2    fish oil-omega-3 fatty acids 1000 MG capsule Take 2 g by mouth daily.      Fluticasone-Salmeterol (ADVAIR) 500-50 MCG/DOSE AEPB Inhale 1 puff into the lungs 2 (two) times daily.    PROCTOSOL HC 2.5 % rectal cream apply rectally twice a day Qty: 28.35 g, Refills: 0    simvastatin (ZOCOR) 80 MG tablet take one half tablet daily    triamcinolone cream (KENALOG) 0.1 % Apply topically 2 (two) times  daily. Qty: 30 g, Refills: 5      STOP taking these medications     atenolol (TENORMIN) 50 MG tablet      NON FORMULARY      prazosin (MINIPRESS) 1 MG capsule        No Known Allergies Follow-up Information   Follow up with [facility]. (RN, PT, OT, aide and social worker to follow up with you at home.  )    Contact information:   [address] [place] [address] [phone]       Follow up with [facility] On 09/10/2014. (@ 2:40; Clinic located in the [facility], Please bring all your medications to your visit. )    Specialty:  Cardiology   Contact information:   [id number] [place] [address] [phone]       The results of significant diagnostics from this hospitalization (including imaging, microbiology, ancillary and laboratory) are listed below for reference.    Significant Diagnostic Studies: Dg Chest 1 View  08/15/2014   CLINICAL DATA:  rapid response, sob, tachycardia  EXAM: CHEST - 1 VIEW  COMPARISON:  08/15/2014 at 0515 hr  FINDINGS: Interval development of moderate pulmonary edema with perihilar and basilar alveolar edema. Cardiopericardial silhouette partially obscured but grossly unchanged compared to prior. No pleural effusion. No pneumothorax. Old RIGHT distal clavicle fracture.  IMPRESSION: Interval development of moderate bilateral airspace disease consistent with pulmonary edema.   Electronically Signed   By: Dereck Ligas M.D.   On: 08/15/2014 19:58   Ct Abdomen Pelvis W Contrast  08/13/2014   CLINICAL DATA:  GI bleeding.  EXAM: CT ABDOMEN AND PELVIS WITH CONTRAST  TECHNIQUE: Multidetector CT imaging of the abdomen and pelvis was performed using the standard protocol following bolus administration of intravenous contrast.  CONTRAST:  46mL OMNIPAQUE IOHEXOL 300 MG/ML SOLN, 112mL OMNIPAQUE IOHEXOL 300 MG/ML SOLN  COMPARISON:  None.  FINDINGS:  The lung bases demonstrate a focal calcification over the post to medial right duct matter border. There is minimal linear scarring over the right base.  Abdominal images demonstrate several calcified splenic granulomas. There  is mild cholelithiasis. The liver, pancreas and adrenal glands are within normal. Kidneys normal in size without hydronephrosis or focal mass. There are a couple small calcifications adjacent the left renal hilum likely vascular. Ureters are normal. The appendix is normal. There is diverticulosis throughout the colon including the cecum. There is calcified plaque involving the abdominal aorta and iliac vessels. There is minimal dilatation of the infrarenal abdominal aorta measuring 2.7 cm in AP diameter.  Pelvic images demonstrate the bladder, prostate and rectum to be within normal. There is no free fluid or inflammatory change noted. Hardware is present over the left proximal femur. There are mild degenerative changes of the spine and hips.  IMPRESSION: No acute findings in the abdomen/pelvis.  Moderate diverticulosis throughout the colon without active inflammation.  Mild cholelithiasis.   Electronically Signed   By: Marin Olp M.D.   On: 08/13/2014 17:52   Dg Chest Port 1 View  08/23/2014   CLINICAL DATA:  Shortness of Breath  EXAM: PORTABLE CHEST - 1 VIEW  COMPARISON:  08/22/2014  FINDINGS: Cardiomediastinal silhouette is stable. Left IJ central line is unchanged in position. Persistent bilateral mild congestion/edema. Probable bilateral small pleural effusion with bilateral basilar atelectasis or infiltrate.  IMPRESSION: Persistent bilateral mild congestion/edema. Probable bilateral small pleural effusion with bilateral basilar atelectasis or infiltrate.   Electronically Signed   By: Lahoma Crocker M.D.   On: 08/23/2014 11:11   Dg Chest Port 1 View  08/22/2014   CLINICAL DATA:  Respiratory distress.  EXAM: PORTABLE CHEST - 1 VIEW  COMPARISON:  08/21/2014.  FINDINGS: Left IJ line in  stable anatomic position. Mediastinum and hilar structures are stable. Cardiomegaly with pulmonary vascular prominence and diffuse bilateral pulmonary infiltrates noted consistent with congestive heart failure and pulmonary edema. Pulmonary edema has progressed from prior exam. Tiny pleural effusions cannot be excluded. There is no pneumothorax. No acute bony abnormality.  IMPRESSION: 1. Congestive heart failure with bilateral pulmonary edema. Pulmonary has progressed from prior exam. Small pleural effusions cannot be excluded. 2. Left IJ line in stable position.   Electronically Signed   By: Marcello Moores  Register   On: 08/22/2014 10:05   Dg Chest Port 1 View  08/21/2014   CLINICAL DATA:  Respiratory failure.  EXAM: PORTABLE CHEST - 1 VIEW  COMPARISON:  Single view of the chest 08/20/2014 and 08/19/2014.  FINDINGS: Endotracheal tube and feeding tube have been removed. Left IJ catheter remains in place. Pulmonary edema continues to improve. Small effusions and basilar airspace disease, right worse than left, have also improved. Heart size is normal. No pneumothorax is identified. Remote right clavicle fracture is noted.  IMPRESSION: Continued improvement in edema, effusions and basilar atelectasis.   Electronically Signed   By: Inge Rise M.D.   On: 08/21/2014 07:14   Dg Chest Port 1 View  08/20/2014   CLINICAL DATA:  Followup respiratory failure  EXAM: PORTABLE CHEST - 1 VIEW  COMPARISON:  08/19/2014  FINDINGS: There is an endotracheal tube which ends just below the clavicular heads. Gastric suction tube crosses the diaphragm. Left IJ catheter is stable, with tip reaching the upper SVC.  Interstitial opacities in the upper lungs appear decreased, although blood flow remain cephalized. There is increasing hazy opacification of the right mid and lower chest and of the retrocardiac lung. No pneumothorax.  IMPRESSION: 1. Tubes and central line remain in good position. 2. Pulmonary venous congestion with increasing  or layering pleural effusions.   Electronically Signed   By: Roderic Palau  Watts M.D.   On: 08/20/2014 07:29   Dg Chest Port 1 View  08/19/2014   CLINICAL DATA:  Endotracheal tube placement  EXAM: PORTABLE CHEST - 1 VIEW  COMPARISON:  08/19/2014  FINDINGS: Endotracheal tube tip approximately 12 mm proximal to the carina. NG tube descends below the level of the image. Left IJ catheter tip projects over the brachiocephalic/SVC confluence. Numerous other wires, tubes, and support devices are presumably external. The lungs show interstitial and hazy airspace opacities. Small effusions suspected. No pneumothorax. No interval osseous change.  IMPRESSION: Endotracheal tube tip 1.2 cm proximal to the carina. Consider retracting 1-2 cm.  Bilateral interstitial and hazy airspace opacities may reflect pulmonary edema and/or multifocal infection.  Small effusions and associated airspace opacities ; atelectasis versus infiltrate.   Electronically Signed   By: Carlos Levering M.D.   On: 08/19/2014 02:55   Dg Chest Port 1 View  08/19/2014   CLINICAL DATA:  Respiratory distress  EXAM: PORTABLE CHEST - 1 VIEW  COMPARISON:  08/18/2014  FINDINGS: Left IJ central venous catheter tip projects over the brachiocephalic/ SVC confluence. Bilateral perihilar interstitial and hazy airspace opacities. More confluent right lung base opacity and small pleural effusions not excluded. No pneumothorax. Central vascular congestion. Heart size upper normal. Osteopenia and multilevel degenerative changes.  IMPRESSION: Central vascular congestion and peri-/infrahilar interstitial and airspace opacity may reflect pulmonary edema or multifocal infection.  More confluent right lung base opacity; atelectasis versus infiltrate. Small effusions not excluded.   Electronically Signed   By: Carlos Levering M.D.   On: 08/19/2014 01:50   Dg Chest Port 1 View  08/18/2014   CLINICAL DATA:  68 year old male. Intubated. GI bleeding, myocardial infarction,  respiratory failure. Initial encounter.  EXAM: PORTABLE CHEST - 1 VIEW  COMPARISON:  08/17/2014 and earlier.  FINDINGS: Portable AP semi upright view at 0544 hrs. Stable endotracheal tube. Resuscitation pads in place. Enteric tube looped in the left upper quadrant. Stable left IJ central line. Stable cardiac size and mediastinal contours. No pneumothorax. Mildly regressed basilar predominant interstitial opacity. No large effusion. No definite consolidation. No areas of worsening ventilation.  IMPRESSION: 1.  Stable lines and tubes. 2. Mild regression of interstitial edema. Superimposed lung base atelectasis suspected.   Electronically Signed   By: Lars Pinks M.D.   On: 08/18/2014 07:33   Dg Chest Port 1 View  08/17/2014   CLINICAL DATA:  Check endotracheal tube placement  EXAM: PORTABLE CHEST - 1 VIEW  COMPARISON:  08/16/2014  FINDINGS: Cardiac shadow is stable. An endotracheal tube is again seen 5.5 cm above the carina. A nasogastric catheter is noted within the stomach as well as a left jugular line in the proximal superior vena cava. Patchy changes remain in the right lung base. Some clearing is noted in the right upper lobe. Mild increased atelectasis in the left lung base is noted. No pneumothorax is seen. No sizable effusion is noted.  IMPRESSION: Bibasilar changes right greater than left. The left-sided changes are new from the prior exam.   Electronically Signed   By: Inez Catalina M.D.   On: 08/17/2014 07:24   Dg Chest Port 1 View  08/16/2014   CLINICAL DATA:  Evaluate endotracheal tube  EXAM: PORTABLE CHEST - 1 VIEW  COMPARISON:  08/15/2014  FINDINGS: Endotracheal tube tip about 5.8 cm above the carina. Left central line in unchanged position. NG tube again crosses the gastroesophageal junction. Extensive infiltrate right middle and lower lobe stable. No significant findings on the  left.  IMPRESSION: Endotracheal tube as described.  Persistent right-sided infiltrate.   Electronically Signed   By:  Skipper Cliche M.D.   On: 08/16/2014 07:37   Dg Chest Port 1 View  08/15/2014   CLINICAL DATA:  Line and tube placement.  EXAM: PORTABLE CHEST - 1 VIEW  COMPARISON:  08/15/2014.  FINDINGS: Interval left jugular catheter with its tip in the proximal superior vena cava. No pneumothorax. Interval endotracheal tube in satisfactory position. Interval nasogastric tube with its tip in the proximal stomach and side hole in the distal esophagus. Normal sized heart. No significant change in diffuse bilateral interstitial prominence and patchy airspace opacity in the right mid and lower lung zones and medial left lung base. Diffuse osteopenia.  IMPRESSION: 1. Tubes and catheter, as described above. 2. Stable changes of congestive heart failure.   Electronically Signed   By: Enrique Sack M.D.   On: 08/15/2014 22:04   Dg Chest Port 1 View  08/15/2014   CLINICAL DATA:  Chest pain  EXAM: PORTABLE CHEST - 1 VIEW  COMPARISON:  09/06/2011  FINDINGS: Small stable granuloma in the right upper lobe near the minor fissure. There is minor lung base linear/reticular opacity that is most likely atelectasis. Lungs are otherwise clear. No pleural effusion or pneumothorax.  Normal heart, mediastinum and hila.  Bony thorax is demineralized but grossly intact.  IMPRESSION: No active disease.   Electronically Signed   By: Lajean Manes M.D.   On: 08/15/2014 07:46    Microbiology: No results found for this or any previous visit (from the past 240 hour(s)).   Labs: Basic Metabolic Panel:  Recent Labs Lab 08/30/14 0500 08/31/14 0708 09/01/14 0253 09/02/14 0700 09/03/14 0357  NA 142 138 133* 136* 137  K 4.0 4.1 4.2 3.9 4.2  CL 101 98 94* 95* 95*  CO2 29 31 29 31 29   GLUCOSE 113* 89 108* 109* 91  BUN 43* 43* 46* 41* 38*  CREATININE 1.70* 1.70* 1.70* 1.71* 1.60*  CALCIUM 8.2* 8.2* 8.3* 8.5 8.4   Liver Function Tests: No results found for this basename: AST, ALT, ALKPHOS, BILITOT, PROT, ALBUMIN,  in the last 168 hours No  results found for this basename: LIPASE, AMYLASE,  in the last 168 hours No results found for this basename: AMMONIA,  in the last 168 hours CBC:  Recent Labs Lab 08/28/14 0315 08/30/14 0500 08/31/14 0708 09/01/14 0253  WBC  --  19.7* 16.5* 16.3*  HGB 9.2* 9.7* 10.0* 10.3*  HCT 27.7* 29.9* 30.1* 31.1*  MCV  --  88.2 87.8 87.6  PLT  --  457* 448* 403*   Cardiac Enzymes: No results found for this basename: CKTOTAL, CKMB, CKMBINDEX, TROPONINI,  in the last 168 hours BNP: BNP (last 3 results)  Recent Labs  08/20/14 0500 08/22/14 1150 08/29/14 0448  PROBNP 51736.0* 43395.0* 16284.0*   CBG:  Recent Labs Lab 09/02/14 0622 09/02/14 1138 09/02/14 1644 09/02/14 2047 09/03/14 0552  GLUCAP 93 134* 136* 116* 105*       Signed:  VANN, JESSICA  [facility] 09/03/2014, 10:33 AM

## 2014-09-02 NOTE — Progress Notes (Signed)
PROGRESS NOTE  Douglas Mann CNO:[id number] DOB: 05/29/1946 DOA: 08/13/2014 PCP: [name], MD  68 year old male with 59 pack active smoker, full code with known CAD and s/p stent [facility] > 10 years ago and copd with o2 dependency Admitted 08/13/14  with  a chief complaint of bleeding in his bowels and a swollen abdomen along with a story that sounded like mesentric angina (post prandial abd discmfrt and weight Loss and ability to handle only small amounts of food at at time). At admission he ruled out for MI. Then on evening of 08/15/14 he develped sudden onset chest pain, with sinus tachycardia with EKG change to duffise ST depression in inferior and lateral leads and respiratory distress with CXR suggestive of flash pulmonary edema. Despite lasix he developed 2 x episodes of V Tach with normal BP and needing IV amio (never needed cpr). Patient emergently intubated on [facility] floor and moved to ICU at [facility].   8/30 he was extubated  Then on 8/31 early AM: re-intubated. Edema pattern on CXR. Milrinone initiated by Cardiology  9/01 Extubated. Remained slightly encephalopathic due to residual sedating medications. Discussed re-intubation status with pt (who did not seem to fully grasp the conversation) and his daughter who feels certain that he would not wish to undergo repeat intubation or ACLS in this situation where there are very limited treatment options and where recurrent respiratory fialure would be indicative of failure of those few options  Assessment/Plan: Acute on chronic systolic heart failure-  Per cardiology. improving Low EF -getting fitted for life vest  Severe oxygen requiring COPD - wears O2 at home and still smokes atrovent and xopenex due to arrhythmias. No wheeze today. Taper prednisone slowly. Completed course of antibiotics  Status post salvage LAD stent in the setting of cardiogenic shock and pulmonary edema   Ventricular tachycardia. Resolved on  amiodarone  AKI Creatinine stable  Hematochezia, resolved Suspect hemorroids. No plans for endoscopy. S/p pRBC  Chronic intermittent abd pain, concern for mesenteric ischemia  Consider CTA abdomen/pelvis when clinically stable per GI recs - renal status needs to be optimized No pain after eating  Anemia secondary to blood loss and critical illness hgb improving  Hyperglycemia SSI  Code Status: DNR Family Communication: friend at bedside Disposition Plan: d/c when ok with cards- poor overall prognosis   Consultants:  PCCM  Cards  pallaitive care  Procedures:  cath   HPI/Subjective: Breathing and moving better  Objective: Filed Vitals:   09/02/14 1400  BP: 90/47  Pulse: 75  Temp: 98 F (36.7 C)  Resp: 18    Intake/Output Summary (Last 24 hours) at 09/02/14 1850 Last data filed at 09/02/14 1700  Gross per 24 hour  Intake   1080 ml  Output   1676 ml  Net   -596 ml   Filed Weights   08/27/14 0400 08/28/14 0300 08/29/14 0300  Weight: 76.7 kg (169 lb 1.5 oz) 76 kg (167 lb 8.8 oz) 74.9 kg (165 lb 2 oz)    Exam:   General:  Pleasant/cooperative  Cardiovascular: rrr without MGR  Respiratory: decreased, mild exp wheezing  Abdomen: +BS, soft, NT, ND  Ext: SCDs. No CCE  Data Reviewed: Basic Metabolic Panel:  Recent Labs Lab 08/29/14 0500 08/30/14 0500 08/31/14 0708 09/01/14 0253 09/02/14 0700  NA 137 142 138 133* 136*  K 4.2 4.0 4.1 4.2 3.9  CL 100 101 98 94* 95*  CO2 29 29 31 29 31   GLUCOSE 104* 113* 89 108*  109*  BUN 44* 43* 43* 46* 41*  CREATININE 1.60* 1.70* 1.70* 1.70* 1.71*  CALCIUM 8.3* 8.2* 8.2* 8.3* 8.5   Liver Function Tests: No results found for this basename: AST, ALT, ALKPHOS, BILITOT, PROT, ALBUMIN,  in the last 168 hours No results found for this basename: LIPASE, AMYLASE,  in the last 168 hours No results found for this basename: AMMONIA,  in the last 168 hours CBC:  Recent Labs Lab 08/27/14 0400 08/28/14 0315  08/30/14 0500 08/31/14 0708 09/01/14 0253  WBC  --   --  19.7* 16.5* 16.3*  HGB 8.6* 9.2* 9.7* 10.0* 10.3*  HCT 25.8* 27.7* 29.9* 30.1* 31.1*  MCV  --   --  88.2 87.8 87.6  PLT  --   --  457* 448* 403*   Cardiac Enzymes: No results found for this basename: CKTOTAL, CKMB, CKMBINDEX, TROPONINI,  in the last 168 hours BNP (last 3 results)  Recent Labs  08/20/14 0500 08/22/14 1150 08/29/14 0448  PROBNP 51736.0* 43395.0* 16284.0*   CBG:  Recent Labs Lab 09/01/14 1610 09/01/14 2102 09/02/14 0622 09/02/14 1138 09/02/14 1644  GLUCAP 140* 138* 93 134* 136*    No results found for this or any previous visit (from the past 240 hour(s)).   Studies: No results found.  Scheduled Meds: . amiodarone  400 mg Oral BID  . aspirin  81 mg Oral Daily  . atorvastatin  80 mg Oral q1800  . budesonide (PULMICORT) nebulizer solution  0.5 mg Nebulization BID  . clopidogrel  75 mg Oral Daily  . docusate sodium  100 mg Oral BID  . fluticasone  2 spray Each Nare Daily  . furosemide  40 mg Oral BID  . hydrocortisone   Rectal BID  . insulin aspart  0-15 Units Subcutaneous TID WC  . ipratropium  0.5 mg Nebulization BID  . levalbuterol  0.63 mg Nebulization BID  . lisinopril  2.5 mg Oral Daily  . mirtazapine  15 mg Oral QHS  . predniSONE  30 mg Oral Q breakfast  . ranolazine  500 mg Oral BID  . sodium chloride  3 mL Intravenous Q12H  . sodium chloride  3 mL Intravenous Q12H  . triamcinolone cream   Topical BID   Continuous Infusions: . sodium chloride Stopped (08/26/14 1900)   Antibiotics Given (last 72 hours)   None      Time spent: 15 min  Natasha Burda, DO [facility] Pager [phone]. If 7PM-7AM, please contact night-coverage at [email or web address], password [facility] 09/02/2014, 6:50 PM  LOS: 20 days

## 2014-09-02 NOTE — Progress Notes (Signed)
PT Cancellation Note  Patient Details Name: Douglas Mann MRN: [id number] DOB: [birth year]   Cancelled Treatment:    Reason Eval/Treat Not Completed: Fatigue/lethargy limiting ability to participate PT attempted x 2 this AM.  On first attempt, pt had just returned to room after ambulating with cardiac rehab.  On second attempt, pt reporting he is going home today and fears a second walk this morning will make him too tired for the transfer to home.  PT to follow up tomorrow, if pt does not d/c home today.   Lorriane Shire 09/02/2014, 11:01 AM

## 2014-09-02 NOTE — Progress Notes (Signed)
   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  Reviewed records. EF 5-10%. PCI to LAD. Life Vest. Protuberant abdomen. Blood pressure 16X systolic. Low-dose lisinopril 2.5. Lasix 40 twice a day Ranexa for ongoing angina.  Refused rehabilitation facility. He states that he obtains Meals on Wheels through the week. On the weekends he has his neighbors get [employer] for him.  He is at high risk for readmission.  I have asked the advanced heart failure team to consult. Appreciate their assistance.  Candee Furbish, MD

## 2014-09-03 LAB — BASIC METABOLIC PANEL
ANION GAP: 13 (ref 5–15)
BUN: 38 mg/dL — ABNORMAL HIGH (ref 6–23)
CO2: 29 mEq/L (ref 19–32)
Calcium: 8.4 mg/dL (ref 8.4–10.5)
Chloride: 95 mEq/L — ABNORMAL LOW (ref 96–112)
Creatinine, Ser: 1.6 mg/dL — ABNORMAL HIGH (ref 0.50–1.35)
GFR calc Af Amer: 49 mL/min — ABNORMAL LOW (ref >90)
GFR calc non Af Amer: 42 mL/min — ABNORMAL LOW (ref >90)
Glucose, Bld: 91 mg/dL (ref 70–99)
POTASSIUM: 4.2 meq/L (ref 3.7–5.3)
Sodium: 137 mEq/L (ref 137–147)

## 2014-09-03 LAB — GLUCOSE, CAPILLARY
Glucose-Capillary: 105 mg/dL — ABNORMAL HIGH (ref 70–99)
Glucose-Capillary: 131 mg/dL — ABNORMAL HIGH (ref 70–99)

## 2014-09-03 MED ORDER — AMIODARONE HCL 400 MG PO TABS: ORAL_TABLET | ORAL | Status: DC

## 2014-09-03 NOTE — Progress Notes (Signed)
Patient discharged home in stable condition. Pt and daughter verbalize understanding of all discharge instructions, including home medications and follow up appointments. 

## 2014-09-03 NOTE — Progress Notes (Signed)
Subjective:  68 year old with severe ischemic cardiomyopathy, high risk PCI to LAD with LVEDP 45 during heart catheterization on 08/16/14 with ejection fraction of 5-10% at that time with severe COPD on home O2.  Appreciate heart failure team consultation.  Still remain quite weak. Refusing rehabilitation facility. Renal function improved.  Objective:  Vital Signs in the last 24 hours: Temp:  [98 F (36.7 C)-98.2 F (36.8 C)] 98.2 F (36.8 C) (09/15 0552) Pulse Rate:  [67-77] 67 (09/15 0552) Resp:  [18-19] 19 (09/15 0552) BP: (90-108)/(45-63) 99/63 mmHg (09/15 0552) SpO2:  [98 %-100 %] 100 % (09/15 0552)  Intake/Output from previous day: 09/14 0701 - 09/15 0700 In: 1080 [P.O.:1080] Out: 1725 [Urine:1725]   Physical Exam: General: Well developed, well nourished, in no acute distress. Head:  Normocephalic and atraumatic. JVP does not appear to be elevated Lungs: Clear to auscultation and percussion. Heart: Normal S1 and S2.  No murmur, rubs or gallops.  Abdomen: soft, non-tender, positive bowel sounds. Protuberant Extremities: No clubbing or cyanosis. No edema. Good distal perfusion. Warm Neurologic: Alert and oriented x 3. Generalized weakness    Lab Results:  Recent Labs  09/01/14 0253  WBC 16.3*  HGB 10.3*  PLT 403*    Recent Labs  09/02/14 0700 09/03/14 0357  NA 136* 137  K 3.9 4.2  CL 95* 95*  CO2 31 29  GLUCOSE 109* 91  BUN 41* 38*  CREATININE 1.71* 1.60*   Telemetry: Sinus rhythm 70s Personally viewed.   Scheduled Meds: . amiodarone  400 mg Oral BID  . aspirin  81 mg Oral Daily  . atorvastatin  80 mg Oral q1800  . budesonide (PULMICORT) nebulizer solution  0.5 mg Nebulization BID  . clopidogrel  75 mg Oral Daily  . docusate sodium  100 mg Oral BID  . fluticasone  2 spray Each Nare Daily  . furosemide  40 mg Oral BID  . hydrocortisone   Rectal BID  . insulin aspart  0-15 Units Subcutaneous TID WC  . ipratropium  0.5 mg Nebulization BID  .  levalbuterol  0.63 mg Nebulization BID  . lisinopril  2.5 mg Oral Daily  . mirtazapine  15 mg Oral QHS  . predniSONE  30 mg Oral Q breakfast  . ranolazine  500 mg Oral BID  . sodium chloride  3 mL Intravenous Q12H  . sodium chloride  3 mL Intravenous Q12H  . triamcinolone cream   Topical BID   Continuous Infusions: . sodium chloride Stopped (08/26/14 1900)   PRN Meds:.sodium chloride, guaiFENesin, levalbuterol, LORazepam, oxyCODONE, senna, simethicone, sodium chloride  Assessment/Plan:   1. Ischemic cardiomyopathy  - Appreciate heart failure team consultation and followup in outpatient setting.  - He is at high risk for readmission  - Ejection fraction during cardiac catheterization reported 5-10% however echocardiogram on 8/28 demonstrated EF of 30-35%.  - On low-dose lisinopril 2.5 mg once a day for afterload reduction. Difficult to titrate upward because of hypotension.  - Not currently on beta blocker because of hypotension.  - On Ranexa because of refractory angina. Improved chest discomfort.  - Lasix currently 40 mg twice daily. Creatinine improved from 1.7 down to 1.6. Continue with current dose.   - He did not wish to pursue right heart catheterization currently. This is understandable. He will have close followup in heart failure clinic.  - Life vest.  - Basic metabolic profile in followup.  2. Acute systolic heart failure  - As above.  3. Coronary artery  disease/non-ST elevation myocardial infarction  - LAD PCI.  - Dual antiplatelet therapy for one year. Statin.  4. Deconditioning  - Home health PT.  5. Nonsustained ventricular tachycardia  - Continue amiodarone 400 mg twice a day for 4 more days then change to 400 mg daily.  Reasonable for discharge today. Weight currently [place], [place] 09/03/2014, 8:14 AM

## 2014-09-05 ENCOUNTER — Telehealth (HOSPITAL_COMMUNITY): Payer: Self-pay | Admitting: Surgery

## 2014-09-05 NOTE — Telephone Encounter (Signed)
Called to follow-up with patient after recent discharge from the hospital.  He has been weighing daily and reports weight today is 159 lbs.  He does not report any other symptoms of HF.  He does say that he is having soreness from "bed sores".  He is having "Meals on Wheels" deliver food and received that today.  He is expecting his HHRN to visit today and is awaiting a call from her.  I reminded him of his upcoming appt with the AHF clinic on Tuesday 22, 2015.  He says his daughter will transport him and he will have her call me back so that she will have more detailed instructions on how to get here.  Overall he feels as though things are going well.  Encouraged him to call with any concerns or questions.

## 2014-09-10 ENCOUNTER — Encounter (HOSPITAL_COMMUNITY): Payer: Self-pay

## 2014-09-10 ENCOUNTER — Ambulatory Visit (HOSPITAL_COMMUNITY)
Admit: 2014-09-10 | Discharge: 2014-09-10 | Disposition: A | Discharge status: Home / Self Care | Payer: Medicare Other | Source: Ambulatory Visit | Attending: Internal Medicine | Admitting: Internal Medicine

## 2014-09-10 VITALS — BP 70/50 | HR 102 | Wt 150.1 lb

## 2014-09-10 DIAGNOSIS — I2510 Atherosclerotic heart disease of native coronary artery without angina pectoris: Secondary | ICD-10-CM | POA: Insufficient documentation

## 2014-09-10 DIAGNOSIS — F17200 Nicotine dependence, unspecified, uncomplicated: Secondary | ICD-10-CM | POA: Insufficient documentation

## 2014-09-10 DIAGNOSIS — I5022 Chronic systolic (congestive) heart failure: Secondary | ICD-10-CM | POA: Insufficient documentation

## 2014-09-10 DIAGNOSIS — R5381 Other malaise: Secondary | ICD-10-CM | POA: Diagnosis not present

## 2014-09-10 DIAGNOSIS — J449 Chronic obstructive pulmonary disease, unspecified: Secondary | ICD-10-CM

## 2014-09-10 DIAGNOSIS — Z9981 Dependence on supplemental oxygen: Secondary | ICD-10-CM | POA: Insufficient documentation

## 2014-09-10 DIAGNOSIS — R5383 Other fatigue: Secondary | ICD-10-CM

## 2014-09-10 DIAGNOSIS — I952 Hypotension due to drugs: Secondary | ICD-10-CM | POA: Insufficient documentation

## 2014-09-10 DIAGNOSIS — I959 Hypotension, unspecified: Secondary | ICD-10-CM | POA: Insufficient documentation

## 2014-09-10 DIAGNOSIS — I5041 Acute combined systolic (congestive) and diastolic (congestive) heart failure: Secondary | ICD-10-CM

## 2014-09-10 DIAGNOSIS — J4489 Other specified chronic obstructive pulmonary disease: Secondary | ICD-10-CM | POA: Insufficient documentation

## 2014-09-10 DIAGNOSIS — I509 Heart failure, unspecified: Secondary | ICD-10-CM | POA: Diagnosis not present

## 2014-09-10 DIAGNOSIS — I255 Ischemic cardiomyopathy: Secondary | ICD-10-CM

## 2014-09-10 DIAGNOSIS — N189 Chronic kidney disease, unspecified: Secondary | ICD-10-CM | POA: Diagnosis not present

## 2014-09-10 DIAGNOSIS — I129 Hypertensive chronic kidney disease with stage 1 through stage 4 chronic kidney disease, or unspecified chronic kidney disease: Secondary | ICD-10-CM | POA: Insufficient documentation

## 2014-09-10 DIAGNOSIS — D649 Anemia, unspecified: Secondary | ICD-10-CM | POA: Diagnosis not present

## 2014-09-10 LAB — BASIC METABOLIC PANEL
Anion gap: 11 (ref 5–15)
BUN: 23 mg/dL (ref 6–23)
CHLORIDE: 96 meq/L (ref 96–112)
CO2: 30 meq/L (ref 19–32)
Calcium: 9.3 mg/dL (ref 8.4–10.5)
Creatinine, Ser: 1.72 mg/dL — ABNORMAL HIGH (ref 0.50–1.35)
GFR calc Af Amer: 45 mL/min — ABNORMAL LOW (ref >90)
GFR calc non Af Amer: 39 mL/min — ABNORMAL LOW (ref >90)
Glucose, Bld: 114 mg/dL — ABNORMAL HIGH (ref 70–99)
POTASSIUM: 4.5 meq/L (ref 3.7–5.3)
Sodium: 137 mEq/L (ref 137–147)

## 2014-09-10 MED ORDER — ATORVASTATIN CALCIUM 40 MG PO TABS: 40.0000 mg | ORAL_TABLET | Freq: Every day | ORAL | Status: DC

## 2014-09-10 MED ORDER — AMIODARONE HCL 200 MG PO TABS: 200.0000 mg | ORAL_TABLET | Freq: Every day | ORAL | Status: DC

## 2014-09-10 MED ORDER — SODIUM CHLORIDE 0.9 % IV BOLUS (SEPSIS): 500.0000 mL | Freq: Once | INTRAVENOUS | Status: DC

## 2014-09-10 MED ORDER — AMIODARONE HCL 400 MG PO TABS: 400.0000 mg | ORAL_TABLET | Freq: Every day | ORAL | Status: DC

## 2014-09-10 NOTE — Patient Instructions (Addendum)
Follow up on Thursday  Stop lasix   Stop lisinopril  Take 400 mg amiodarone daily  Stop Zocor  Take atorvastatin 40 mg daily  Do the following things EVERYDAY: 1) Weigh yourself in the morning before breakfast. Write it down and keep it in a log. 2) Take your medicines as prescribed 3) Eat low salt foods-Limit salt (sodium) to 2000 mg per day.  4) Stay as active as you can everyday 5) Limit all fluids for the day to less than 2 liters

## 2014-09-10 NOTE — Progress Notes (Signed)
Patient ID: Douglas Mann, male   DOB: 02/17/1946, 68 y.o.   MRN: [id number] PCP: Dr. Elease Hashimoto  HPI: Douglas Mann is a 68 year old with history of 79 pack/yr active smoker, COPD oxygen dependent x 2 yrs, CAD and s/p stent [facility] > 10 years ago, HTN, and prior GI bleed.   Patient presented to the ED on 08/13/14 with CP and abdominal pain. He became tachypneic, hypoxic and diaphoretic and was intubated. NSTEMI noted with positive troponin and ST depression, and on 08/16/14 underwent LHC revealing severe 2 V CAD with 100% occluded RCA filled via collaterals and prox LAD 90% stenosis with pre/post stenosis ectasia. EF down to 5-10% and marked LVEDP 45. He underwent high risk PCI to LAD with IABP placement. Was reintubated after IABP removed and extubated again 9/1. Also required milrinone and IV lasix. Later Milrinone stopped. He was transitioned to lasix 40 mg twice a day. He was not placed on BB due to cardiogenic shock.  Had frequent NSVT and ranexa was added to amiodarone and LifeVest ordered. Weight on discharge was 165 pounds.   He presents for post hospital follow up. Complains of fatigue. Not particularly short of breath but very weak.  Sleeps in recliner. Reports dizziness when stands up and not doing much around the house. SBP was in the 70s when he arrived in the office.  No syncope or falls.  Wearing Lifevest. No alarms. Weight at home 148-154 pounds. [facility] following. Wears 3 liters oxygen [place]. Taking all medications. Poor appetite.  Drinking< 2 liters per day. No BRBPR   Labs (9/15): K 4.2 Creatinine 1.6  ECG: NSR, old inferior MI  ROS: All systems negative except as listed in HPI, PMH and Problem List.  SH: Active smoker, lives with daughter  FH: CAD  Past Medical History  Diagnosis Date  . HYPERLIPIDEMIA 10/01/2009  . HYPERTENSION 10/01/2009  . CAD 10/01/2009    Stent at [place] greater than 10 years ago  . PVD 10/01/2009  . ALLERGIC RHINITIS 12/24/2009  . COPD 10/01/2009  . OSTEOARTHRITIS,  GENERALIZED, MULTIPLE JOINTS 12/24/2009    Current Outpatient Prescriptions  Medication Sig Dispense Refill  . albuterol (VENTOLIN HFA) 108 (90 BASE) MCG/ACT inhaler Inhale 2 puffs into the lungs every 6 (six) hours as needed.  3 Inhaler  3  . amiodarone (PACERONE) 400 MG tablet 400 mg BID x 4 days then daily  38 tablet  0  . aspirin 81 MG tablet Take 81 mg by mouth daily.        . clopidogrel (PLAVIX) 75 MG tablet take 1 tablet by mouth once daily  90 tablet  2  . fish oil-omega-3 fatty acids 1000 MG capsule Take 2 g by mouth daily.        . Fluticasone-Salmeterol (ADVAIR) 500-50 MCG/DOSE AEPB Inhale 1 puff into the lungs 2 (two) times daily.      Marland Kitchen PROCTOSOL HC 2.5 % rectal cream apply rectally twice a day  28.35 g  0  . ranolazine (RANEXA) 500 MG 12 hr tablet Take 1 tablet (500 mg total) by mouth 2 (two) times daily.  60 tablet  0  . triamcinolone cream (KENALOG) 0.1 % Apply topically 2 (two) times daily.  30 g  5  . atorvastatin (LIPITOR) 40 MG tablet Take 1 tablet (40 mg total) by mouth daily.  30 tablet  6   Current Facility-Administered Medications  Medication Dose Route Frequency Provider Last Rate Last Dose  . sodium chloride 0.9 % bolus  500 mL  500 mL Intravenous Once Amy D Clegg, NP         PHYSICAL EXAM: Filed Vitals:   09/10/14 1505  BP: 70/50  Pulse: 102  Weight: 150 lb 1.9 oz (68.094 kg)  SpO2: 96%   General:  Chronically ill-appearing, no resp difficulty on 3 liters [place] oxygen. Sitting in wheelchair.  HEENT: normal Neck: supple. JVP flat. Carotids 2+ bilaterally; no bruits. No lymphadenopathy or thryomegaly appreciated. Cor: PMI normal. Regular rate & rhythm. No rubs, gallops or murmurs. Lungs: clear on 3 liters [place].  Abdomen: soft, nontender, nondistended. No hepatosplenomegaly. No bruits or masses. Good bowel sounds. Extremities: no cyanosis, clubbing, rash, edema Neuro: alert & orientedx3, cranial nerves grossly intact. Moves all 4 extremities w/o difficulty.  Affect pleasant.  ASSESSMENT & PLAN: 1) CAD: s/p recent NSTEMI with high risk PCI to LAD (08/16/14).  Has chronically occluded RCA.   - Continue ASA 81 and Plavix. - He is on amiodarone so simvastatin 40 mg daily is not ideal.  Stop simvastatin 40 and start atorvastatin 40 mg daily with lipids/LFTs in 2 months.  2) Chronic Systolic Heart Failure:  Ischemic cardiomyopathy.  EF 30-35% on last echo.  He does not look volume overloaded, actually on the dry side today.  BP is very low and he has orthostatic symptoms.  Weight down 15 pounds from discharge.  - Stop Lasix and will give 500 cc NS => BP improved to 84/50.  - We will also stop lisinopril.  - No BB due to recent cardiogenic shock and low BP today.  - Check BMET now  - I have asked [facility] to perform home visit tomorrow and check meds.  - Repeat echo in 3 months for ICD.  Not CRT candidate with narrow QRS.  3)  NSVT: On amiodarone and Ranexa with NSVT in hospital. Continue amiodarone 400 mg daily. Continue Lifevest until repeat echo (3 months). Will need LFTs/TSH at followup.  5)  H/o GI bleed: No BRBPR  6) Hypotension: Likely due to over-diuresis. Given 500 cc NS in clinic. SBP 70>84 after fluids.  8) COPD: on 3 liters [place] oxygen  9) Anemia  10) CKD: creatinine base 1.6-1.8  Follow up in 2 days to reassess volume status.   CLEGG,AMY NP-C  4:31 PM  Patient seen with NP, agree with the above note.  Recent complicated admission with NSTEMI, LAD PCI, cardiogenic shock, acute systolic CHF.  He was discharged on Lasix 40 mg bid and now appears dry and hypotensive.  We stopped Lasix and gave him a 500 cc bolus with improvement of blood pressure and resolution of lightheadedness.  I am going to hold Lasix and lisinopril for now, he will followup in clinic on Thursday and we will decide on what to do with them at that time.   Loralie Champagne 09/10/2014 4:57 PM

## 2014-09-11 ENCOUNTER — Telehealth (HOSPITAL_COMMUNITY): Payer: Self-pay | Admitting: Vascular Surgery

## 2014-09-11 NOTE — Telephone Encounter (Signed)
Per Darrick Grinder, expected weight gain from 500cc fluid bolus given yesterday in clinic for SBP in 70's.  Patient had been drastically diuresed since hosp discharge and was down 15lbs.

## 2014-09-11 NOTE — Telephone Encounter (Signed)
Pt gained 3 lbs in one day.. Please advise

## 2014-09-12 ENCOUNTER — Ambulatory Visit (HOSPITAL_COMMUNITY)
Admission: RE | Admit: 2014-09-12 | Discharge: 2014-09-12 | Disposition: A | Discharge status: Home / Self Care | Payer: Medicare Other | Source: Ambulatory Visit | Attending: Internal Medicine | Admitting: Internal Medicine

## 2014-09-12 ENCOUNTER — Telehealth (HOSPITAL_COMMUNITY): Payer: Self-pay | Admitting: Vascular Surgery

## 2014-09-12 VITALS — BP 94/56 | HR 94 | Wt 151.0 lb

## 2014-09-12 DIAGNOSIS — Z87891 Personal history of nicotine dependence: Secondary | ICD-10-CM | POA: Diagnosis not present

## 2014-09-12 DIAGNOSIS — Z7982 Long term (current) use of aspirin: Secondary | ICD-10-CM | POA: Diagnosis not present

## 2014-09-12 DIAGNOSIS — I129 Hypertensive chronic kidney disease with stage 1 through stage 4 chronic kidney disease, or unspecified chronic kidney disease: Secondary | ICD-10-CM | POA: Insufficient documentation

## 2014-09-12 DIAGNOSIS — I6529 Occlusion and stenosis of unspecified carotid artery: Secondary | ICD-10-CM | POA: Diagnosis not present

## 2014-09-12 DIAGNOSIS — N189 Chronic kidney disease, unspecified: Secondary | ICD-10-CM | POA: Diagnosis not present

## 2014-09-12 DIAGNOSIS — J449 Chronic obstructive pulmonary disease, unspecified: Secondary | ICD-10-CM | POA: Insufficient documentation

## 2014-09-12 DIAGNOSIS — J4489 Other specified chronic obstructive pulmonary disease: Secondary | ICD-10-CM | POA: Insufficient documentation

## 2014-09-12 DIAGNOSIS — I2510 Atherosclerotic heart disease of native coronary artery without angina pectoris: Secondary | ICD-10-CM | POA: Insufficient documentation

## 2014-09-12 DIAGNOSIS — I959 Hypotension, unspecified: Secondary | ICD-10-CM | POA: Diagnosis not present

## 2014-09-12 DIAGNOSIS — I5022 Chronic systolic (congestive) heart failure: Secondary | ICD-10-CM

## 2014-09-12 DIAGNOSIS — Z9981 Dependence on supplemental oxygen: Secondary | ICD-10-CM | POA: Insufficient documentation

## 2014-09-12 DIAGNOSIS — D649 Anemia, unspecified: Secondary | ICD-10-CM | POA: Insufficient documentation

## 2014-09-12 DIAGNOSIS — Z7901 Long term (current) use of anticoagulants: Secondary | ICD-10-CM | POA: Insufficient documentation

## 2014-09-12 NOTE — Patient Instructions (Addendum)
Follow up next week   Please take amiodarone 400 mg daily    Do the following things EVERYDAY: 1) Weigh yourself in the morning before breakfast. Write it down and keep it in a log. 2) Take your medicines as prescribed 3) Eat low salt foods-Limit salt (sodium) to 2000 mg per day.  4) Stay as active as you can everyday 5) Limit all fluids for the day to less than 2 liters

## 2014-09-12 NOTE — Progress Notes (Signed)
Patient ID: Douglas Mann, male   DOB: 06/24/1946, 68 y.o.   MRN: [id number]  PCP: Dr. Elease Hashimoto  HPI: Mr Douglas Mann is a 68 year old with history of 86 pack/yr active smoker, COPD oxygen dependent x 2 yrs, CAD and s/[facility] stent [facility] 10 years ago, HTN, and prior GI bleed.   Patient presented to the ED on 08/13/14 with CP and abdominal pain. He became tachypneic, hypoxic and diaphoretic and was intubated. NSTEMI noted with positive troponin and ST depression, and on 08/16/14 underwent LHC revealing severe 2 V CAD with 100% occluded RCA filled via collaterals and prox LAD 90% stenosis with pre/post stenosis ectasia. EF down to 5-10% and marked LVEDP 45. He underwent high risk PCI to LAD with IABP placement. Was reintubated after IABP removed and extubated again 9/1. Also required milrinone and IV lasix. Later Milrinone stopped. He was transitioned to lasix 40 mg twice a day. He was not placed on BB due to cardiogenic shock.  Had frequent NSVT and ranexa was added to amiodarone and LifeVest ordered. Weight on discharge was 165 pounds.   He presents for follow up. Last visit he was given 500 cc NS due to orthostatic hypotension. Lasix and Denies SOB/Orthopne/PND. Mild dizziness. Appetite improving. Wearing lifevest. Weight at home 151-153 pounds.  [facility] following. Wears 3 liters oxygen [place]. Taking all medications. Poor appetite.  Drinking< 2 liters per day. No BRBPR   Labs (9/15): K 4.2 Creatinine 1.6 Labs (09/10/14): K 4.5 Creatinine 1.72    ROS: All systems negative except as listed in HPI, PMH and Problem List.  SH: Active smoker, lives with daughter  FH: CAD  Past Medical History  Diagnosis Date  . HYPERLIPIDEMIA 10/01/2009  . HYPERTENSION 10/01/2009  . CAD 10/01/2009    Stent at [place] greater than 10 years ago  . PVD 10/01/2009  . ALLERGIC RHINITIS 12/24/2009  . COPD 10/01/2009  . OSTEOARTHRITIS, GENERALIZED, MULTIPLE JOINTS 12/24/2009    Current Outpatient Prescriptions  Medication Sig Dispense  Refill  . albuterol (VENTOLIN HFA) 108 (90 BASE) MCG/ACT inhaler Inhale 2 puffs into the lungs every 6 (six) hours as needed.  3 Inhaler  3  . aspirin 81 MG tablet Take 81 mg by mouth daily.        Marland Kitchen atorvastatin (LIPITOR) 40 MG tablet Take 1 tablet (40 mg total) by mouth daily.  30 tablet  6  . clopidogrel (PLAVIX) 75 MG tablet take 1 tablet by mouth once daily  90 tablet  2  . fish oil-omega-3 fatty acids 1000 MG capsule Take 2 g by mouth daily.        . Fluticasone-Salmeterol (ADVAIR) 500-50 MCG/DOSE AEPB Inhale 1 puff into the lungs 2 (two) times daily.      Marland Kitchen PROCTOSOL HC 2.5 % rectal cream apply rectally twice a day  28.35 g  0  . ranolazine (RANEXA) 500 MG 12 hr tablet Take 1 tablet (500 mg total) by mouth 2 (two) times daily.  60 tablet  0  . triamcinolone cream (KENALOG) 0.1 % Apply topically 2 (two) times daily.  30 g  5  . amiodarone (PACERONE) 400 MG tablet Take 1 tablet (400 mg total) by mouth daily.  30 tablet  6   No current facility-administered medications for this encounter.     PHYSICAL EXAM: Filed Vitals:   09/12/14 1146  BP: 94/56  Pulse: 94  Weight: 151 lb (68.493 kg)  SpO2: 98%   General:  Chronically ill-appearing, no resp difficulty  on 3 liters [place] oxygen. Sitting in wheelchair.  HEENT: normal Neck: supple. JVP flat. Carotids 2+ bilaterally; no bruits. No lymphadenopathy or thryomegaly appreciated. Cor: PMI normal. Regular rate & rhythm. No rubs, gallops or murmurs. Wearing Life Vest  Lungs: clear on 3 liters [place].  Abdomen: soft, nontender, nondistended. No hepatosplenomegaly. No bruits or masses. Good bowel sounds. Extremities: no cyanosis, clubbing, rash, edema Neuro: alert & orientedx3, cranial nerves grossly intact. Moves all 4 extremities w/o difficulty. Affect pleasant.  ASSESSMENT & PLAN: 1) CAD: s/p recent NSTEMI with high risk PCI to LAD (08/16/14).  Has chronically occluded RCA.   - Continue ASA 81 and Plavix. - He is on amiodarone and will continue  atorvastatin 40 mg daily with lipids/LFTs in 2 months.  2) Chronic Systolic Heart Failure:  Ischemic cardiomyopathy.  EF 30-35% on last echo.  Volume status still a little low. Keep off lasix for now.  - For now keep off  lisinopril.  - No BB due to recent cardiogenic shock and low BP today.  [facility] to evaluate tomorrow.  -  Repeat echo in 3 months for ICD.  Not CRT candidate with narrow QRS.  3)  NSVT: On amiodarone and Ranexa with NSVT in hospital. Continue amiodarone 400 mg daily. Continue Lifevest until repeat echo (3 months). Will need LFTs/TSH next week 5)  H/o GI bleed: No BRBPR  6) Hypotension: improved.   8) COPD: on 3 liters [place] oxygen  9) Anemia  10) CKD: creatinine base 1.6-1.8  Follow up in next week to reassess and consider adding back 20 mg lasix. Marland Kitchen   CLEGG,AMY NP-C  11:50 AM  Patient seen and examined with Darrick Grinder, NP. We discussed all aspects of the encounter. I agree with the assessment and plan as stated above. He is much better from earlier this week when he was overdiuresed. Will continue to hold lasix for now. Will see back next week to restart. Told daughter that they can use prn lasix as needed for weight gain or swelling. Continue LifeVest.   Benay Spice 4:42 PM

## 2014-09-13 DIAGNOSIS — I5022 Chronic systolic (congestive) heart failure: Secondary | ICD-10-CM | POA: Insufficient documentation

## 2014-09-16 ENCOUNTER — Other Ambulatory Visit: Payer: Self-pay

## 2014-09-16 MED ORDER — FLUTICASONE-SALMETEROL 500-50 MCG/DOSE IN AEPB: 1.0000 | INHALATION_SPRAY | Freq: Two times a day (BID) | RESPIRATORY_TRACT | Status: DC

## 2014-09-20 ENCOUNTER — Encounter (HOSPITAL_COMMUNITY): Payer: Medicare Other

## 2014-09-25 ENCOUNTER — Ambulatory Visit (HOSPITAL_COMMUNITY)
Admission: RE | Admit: 2014-09-25 | Discharge: 2014-09-25 | Disposition: A | Discharge status: Home / Self Care | Payer: Medicare Other | Source: Ambulatory Visit | Attending: Internal Medicine | Admitting: Internal Medicine

## 2014-09-25 ENCOUNTER — Encounter (HOSPITAL_COMMUNITY): Payer: Self-pay

## 2014-09-25 VITALS — BP 128/80 | HR 99 | Wt 153.4 lb

## 2014-09-25 DIAGNOSIS — I5022 Chronic systolic (congestive) heart failure: Secondary | ICD-10-CM | POA: Diagnosis not present

## 2014-09-25 DIAGNOSIS — I129 Hypertensive chronic kidney disease with stage 1 through stage 4 chronic kidney disease, or unspecified chronic kidney disease: Secondary | ICD-10-CM | POA: Insufficient documentation

## 2014-09-25 DIAGNOSIS — I25111 Atherosclerotic heart disease of native coronary artery with angina pectoris with documented spasm: Secondary | ICD-10-CM

## 2014-09-25 DIAGNOSIS — N183 Chronic kidney disease, stage 3 unspecified: Secondary | ICD-10-CM

## 2014-09-25 DIAGNOSIS — I472 Ventricular tachycardia: Secondary | ICD-10-CM | POA: Diagnosis not present

## 2014-09-25 DIAGNOSIS — I2510 Atherosclerotic heart disease of native coronary artery without angina pectoris: Secondary | ICD-10-CM | POA: Insufficient documentation

## 2014-09-25 DIAGNOSIS — I255 Ischemic cardiomyopathy: Secondary | ICD-10-CM

## 2014-09-25 DIAGNOSIS — J449 Chronic obstructive pulmonary disease, unspecified: Secondary | ICD-10-CM | POA: Insufficient documentation

## 2014-09-25 DIAGNOSIS — I252 Old myocardial infarction: Secondary | ICD-10-CM | POA: Insufficient documentation

## 2014-09-25 DIAGNOSIS — F17210 Nicotine dependence, cigarettes, uncomplicated: Secondary | ICD-10-CM | POA: Insufficient documentation

## 2014-09-25 LAB — BASIC METABOLIC PANEL
ANION GAP: 8 (ref 5–15)
BUN: 12 mg/dL (ref 6–23)
CO2: 29 meq/L (ref 19–32)
Calcium: 9.2 mg/dL (ref 8.4–10.5)
Chloride: 100 mEq/L (ref 96–112)
Creatinine, Ser: 1.34 mg/dL (ref 0.50–1.35)
GFR calc Af Amer: 60 mL/min — ABNORMAL LOW (ref >90)
GFR calc non Af Amer: 52 mL/min — ABNORMAL LOW (ref >90)
Glucose, Bld: 121 mg/dL — ABNORMAL HIGH (ref 70–99)
POTASSIUM: 5.1 meq/L (ref 3.7–5.3)
SODIUM: 137 meq/L (ref 137–147)

## 2014-09-25 LAB — PRO B NATRIURETIC PEPTIDE: PRO B NATRI PEPTIDE: 3346 pg/mL — AB (ref 0–125)

## 2014-09-25 MED ORDER — CLOPIDOGREL BISULFATE 75 MG PO TABS: 75.0000 mg | ORAL_TABLET | Freq: Every day | ORAL | Status: DC

## 2014-09-25 MED ORDER — AMIODARONE HCL 200 MG PO TABS: 400.0000 mg | ORAL_TABLET | Freq: Two times a day (BID) | ORAL | Status: DC

## 2014-09-25 MED ORDER — RANOLAZINE ER 500 MG PO TB12: 500.0000 mg | ORAL_TABLET | Freq: Two times a day (BID) | ORAL | Status: DC

## 2014-09-25 MED ORDER — AMIODARONE HCL 200 MG PO TABS: 200.0000 mg | ORAL_TABLET | Freq: Two times a day (BID) | ORAL | Status: DC

## 2014-09-25 MED ORDER — LISINOPRIL 2.5 MG PO TABS: 2.5000 mg | ORAL_TABLET | Freq: Every day | ORAL | Status: DC

## 2014-09-25 NOTE — Progress Notes (Signed)
Patient ID: [name], male   DOB: 1946-04-29, 68 y.o.   MRN: [id number]  PCP: Dr. Elease Hashimoto  HPI: Mr Todorov is a 68 year old with history of 67 pack/yr active smoker, COPD oxygen dependent x 2 yrs, CAD s/p stent at [facility] >10 yrs ago and PCI 2015, HTN, prior GI bleed, ICM and chronic systolic HF.   Patient presented to the ED on 08/13/14 with CP and abdominal pain. He became tachypneic, hypoxic and diaphoretic and was intubated. NSTEMI noted with positive troponin and ST depression, and on 08/16/14 underwent LHC revealing severe 2 V CAD with 100% occluded RCA filled via collaterals and prox LAD 90% stenosis with pre/post stenosis ectasia. EF down to 5-10% and marked LVEDP 45. He underwent high risk PCI to LAD with IABP placement. Was reintubated after IABP removed and extubated again 9/1. Also required milrinone and IV lasix. Later Milrinone stopped. He was transitioned to lasix 40 mg twice a day. He was not placed on BB due to cardiogenic shock.  Had frequent NSVT and ranexa was added to amiodarone and LifeVest ordered. Weight on discharge was 165 pounds.   Follow up for Heart Failure:  Last visit lisinopril and lasix placed on hold. Daughter thinks he is doing much better. +DOE with minimal exertion. Denies PND, orthopnea, CP or edema. Weight at home 153-154 lbs. Denies dizziness. Appetite fair but still not eating much. Wearing LifeVest and not having any alarms. Wearing 3L O2 24/7. Taking medications as prescribed. Following a low salt diet and drinking less than 2L a day.   Labs (9/15): K 4.2 Creatinine 1.6 Labs (09/10/14): K 4.5 Creatinine 1.72    ROS: All systems negative except as listed in HPI, PMH and Problem List.  SH: Active smoker, lives with daughter  FH: CAD  Past Medical History  Diagnosis Date  . HYPERLIPIDEMIA 10/01/2009  . HYPERTENSION 10/01/2009  . CAD 10/01/2009    Stent at [place] greater than 10 years ago  . PVD 10/01/2009  . ALLERGIC RHINITIS 12/24/2009  . COPD 10/01/2009   . OSTEOARTHRITIS, GENERALIZED, MULTIPLE JOINTS 12/24/2009    Current Outpatient Prescriptions  Medication Sig Dispense Refill  . albuterol (VENTOLIN HFA) 108 (90 BASE) MCG/ACT inhaler Inhale 2 puffs into the lungs every 6 (six) hours as needed.  3 Inhaler  3  . amiodarone (PACERONE) 400 MG tablet Take 1 tablet (400 mg total) by mouth daily.  30 tablet  6  . aspirin 81 MG tablet Take 81 mg by mouth daily.        Marland Kitchen atorvastatin (LIPITOR) 40 MG tablet Take 1 tablet (40 mg total) by mouth daily.  30 tablet  6  . clopidogrel (PLAVIX) 75 MG tablet take 1 tablet by mouth once daily  90 tablet  2  . fish oil-omega-3 fatty acids 1000 MG capsule Take 2 g by mouth daily.        . Fluticasone-Salmeterol (ADVAIR) 500-50 MCG/DOSE AEPB Inhale 1 puff into the lungs 2 (two) times daily.  180 each  1  . PROCTOSOL HC 2.5 % rectal cream apply rectally twice a day  28.35 g  0  . ranolazine (RANEXA) 500 MG 12 hr tablet Take 1 tablet (500 mg total) by mouth 2 (two) times daily.  60 tablet  0  . triamcinolone cream (KENALOG) 0.1 % Apply topically 2 (two) times daily.  30 g  5   No current facility-administered medications for this encounter.    Filed Vitals:   09/25/14 1419  BP:  128/80  Pulse: 99  Weight: 153 lb 6.4 oz (69.582 kg)  SpO2: 96%    PHYSICAL EXAM: General:  Chronically ill-appearing, no resp difficulty on 3 liters [place] oxygen. Sitting in wheelchair. Daughter present  HEENT: normal Neck: supple. JVP flat. Carotids 2+ bilaterally; no bruits. No lymphadenopathy or thryomegaly appreciated. Cor: PMI normal. Regular rate & rhythm. No rubs, gallops or murmurs. Wearing Life Vest  Lungs: clear on 3 liters [place].  Abdomen: soft, nontender, nondistended. No hepatosplenomegaly. No bruits or masses. Good bowel sounds. Extremities: no cyanosis, clubbing, rash, edema Neuro: alert & orientedx3, cranial nerves grossly intact. Moves all 4 extremities w/o difficulty. Affect pleasant.  ASSESSMENT & PLAN:  1) CAD:  s/p recent NSTEMI with high risk PCI to LAD (08/16/14).  Has chronically occluded RCA.   - Continue ASA 81 and Plavix. - He is on amiodarone and will continue atorvastatin 40 mg daily with lipids/LFTs in 2 months.  - no s/s of ischemia 2) Chronic Systolic Heart Failure: ICM,.  EF 30-35% (07/2014)  - NYHA III symptoms and volume status stable. Will continue to hold lasix for now. Check BMET and pro-BNP today. - No BB due to recent cardiogenic shock - Will try to add low dose ACE-I back on, lisinopril 2.5 mg at night. Instructed to call if any dizziness. Check BMET next week with [facility]. -  Repeat echo in 3 months, 11/2014 for ICD.  Not CRT candidate with narrow QRS. - Reinforced the need and importance of daily weights, a low sodium diet, and fluid restriction (less than 2 L a day). Instructed to call the HF clinic if weight increases more than 3 lbs overnight or 5 lbs in a week.   3)  NSVT: On amiodarone and Ranexa with NSVT in hospital. Continue amiodarone 200 mg BID. Continue Lifevest until repeat echo (3 months). Will check LFTs and TSH next week with [facility].  4)  H/o GI bleed: - Has occasional BRBPR that is minimal and with wiping. Likely related to hemorrhoids. Marland Kitchen   5) COPD: on 3 liters [place] oxygen with rest and 4L with ambulation. Continue to maintain O2 sats >90%  6) CKD stage III - baseline creatinine base 1.6-1.8. Check BMET today.   Follow up in 1 month  Junie Bame B NP-C  2:25 PM

## 2014-09-25 NOTE — Patient Instructions (Signed)
Doing great.  Start lisinopril 2.5 mg (1 tablet) at bedtime.  Will have [facility] recheck blood work next Thursday.  Follow up in 1 month  Do the following things EVERYDAY: 1) Weigh yourself in the morning before breakfast. Write it down and keep it in a log. 2) Take your medicines as prescribed 3) Eat low salt foods-Limit salt (sodium) to 2000 mg per day.  4) Stay as active as you can everyday 5) Limit all fluids for the day to less than 2 liters 6)

## 2014-10-07 NOTE — Telephone Encounter (Signed)
Encounter open in error 

## 2014-10-10 ENCOUNTER — Encounter: Payer: Self-pay | Admitting: Internal Medicine

## 2014-10-17 ENCOUNTER — Telehealth: Payer: Self-pay | Admitting: Family Medicine

## 2014-10-17 NOTE — Telephone Encounter (Signed)
Trial of Astelin nasal one spray per nostril BID prn and office follow up if not helping.

## 2014-10-17 NOTE — Telephone Encounter (Signed)
Last visit 08/07/14. Do you want patient to come in for a visit.

## 2014-10-17 NOTE — Telephone Encounter (Signed)
Pt having sinus issues. Tickle at back of throat. Pt on flonase. Drainage is affecting appetite, pt is nauseous. Maybe something to dry up this? pls advise.

## 2014-10-18 MED ORDER — AZELASTINE HCL 0.1 % NA SOLN: 1.0000 | Freq: Two times a day (BID) | NASAL | Status: DC

## 2014-10-18 NOTE — Telephone Encounter (Signed)
Clementeen Hoof is aware.

## 2014-10-28 ENCOUNTER — Encounter (HOSPITAL_COMMUNITY): Payer: Self-pay

## 2014-10-28 ENCOUNTER — Ambulatory Visit (HOSPITAL_COMMUNITY)
Admission: RE | Admit: 2014-10-28 | Discharge: 2014-10-28 | Disposition: A | Discharge status: Home / Self Care | Payer: Medicare Other | Source: Ambulatory Visit | Attending: Internal Medicine | Admitting: Internal Medicine

## 2014-10-28 VITALS — BP 142/70 | HR 85 | Wt 151.8 lb

## 2014-10-28 DIAGNOSIS — I739 Peripheral vascular disease, unspecified: Secondary | ICD-10-CM | POA: Diagnosis not present

## 2014-10-28 DIAGNOSIS — J449 Chronic obstructive pulmonary disease, unspecified: Secondary | ICD-10-CM | POA: Diagnosis not present

## 2014-10-28 DIAGNOSIS — F17200 Nicotine dependence, unspecified, uncomplicated: Secondary | ICD-10-CM | POA: Insufficient documentation

## 2014-10-28 DIAGNOSIS — I5022 Chronic systolic (congestive) heart failure: Secondary | ICD-10-CM | POA: Diagnosis present

## 2014-10-28 DIAGNOSIS — Z7982 Long term (current) use of aspirin: Secondary | ICD-10-CM | POA: Insufficient documentation

## 2014-10-28 DIAGNOSIS — I129 Hypertensive chronic kidney disease with stage 1 through stage 4 chronic kidney disease, or unspecified chronic kidney disease: Secondary | ICD-10-CM | POA: Insufficient documentation

## 2014-10-28 DIAGNOSIS — Z9981 Dependence on supplemental oxygen: Secondary | ICD-10-CM | POA: Diagnosis not present

## 2014-10-28 DIAGNOSIS — Z8249 Family history of ischemic heart disease and other diseases of the circulatory system: Secondary | ICD-10-CM | POA: Insufficient documentation

## 2014-10-28 DIAGNOSIS — I471 Supraventricular tachycardia: Secondary | ICD-10-CM | POA: Diagnosis not present

## 2014-10-28 DIAGNOSIS — Z7902 Long term (current) use of antithrombotics/antiplatelets: Secondary | ICD-10-CM | POA: Diagnosis not present

## 2014-10-28 DIAGNOSIS — Z79899 Other long term (current) drug therapy: Secondary | ICD-10-CM | POA: Diagnosis not present

## 2014-10-28 DIAGNOSIS — N183 Chronic kidney disease, stage 3 (moderate): Secondary | ICD-10-CM | POA: Diagnosis not present

## 2014-10-28 DIAGNOSIS — I255 Ischemic cardiomyopathy: Secondary | ICD-10-CM | POA: Diagnosis not present

## 2014-10-28 DIAGNOSIS — Z955 Presence of coronary angioplasty implant and graft: Secondary | ICD-10-CM | POA: Diagnosis not present

## 2014-10-28 DIAGNOSIS — I2510 Atherosclerotic heart disease of native coronary artery without angina pectoris: Secondary | ICD-10-CM | POA: Insufficient documentation

## 2014-10-28 LAB — BASIC METABOLIC PANEL
ANION GAP: 14 (ref 5–15)
BUN: 13 mg/dL (ref 6–23)
CHLORIDE: 95 meq/L — AB (ref 96–112)
CO2: 24 mEq/L (ref 19–32)
Calcium: 9.4 mg/dL (ref 8.4–10.5)
Creatinine, Ser: 1.12 mg/dL (ref 0.50–1.35)
GFR calc Af Amer: 75 mL/min — ABNORMAL LOW (ref >90)
GFR, EST NON AFRICAN AMERICAN: 65 mL/min — AB (ref >90)
Glucose, Bld: 109 mg/dL — ABNORMAL HIGH (ref 70–99)
POTASSIUM: 4.3 meq/L (ref 3.7–5.3)
Sodium: 133 mEq/L — ABNORMAL LOW (ref 137–147)

## 2014-10-28 LAB — PRO B NATRIURETIC PEPTIDE: Pro B Natriuretic peptide (BNP): 2110 pg/mL — ABNORMAL HIGH (ref 0–125)

## 2014-10-28 MED ORDER — SACUBITRIL-VALSARTAN 24-26 MG PO TABS: 1.0000 | ORAL_TABLET | Freq: Two times a day (BID) | ORAL | Status: DC

## 2014-10-28 MED ORDER — AMIODARONE HCL 200 MG PO TABS: 200.0000 mg | ORAL_TABLET | Freq: Every day | ORAL | Status: DC

## 2014-10-28 MED ORDER — ATORVASTATIN CALCIUM 40 MG PO TABS: 40.0000 mg | ORAL_TABLET | Freq: Every day | ORAL | Status: DC

## 2014-10-28 MED ORDER — FUROSEMIDE 20 MG PO TABS: 20.0000 mg | ORAL_TABLET | ORAL | Status: DC | PRN

## 2014-10-28 NOTE — Progress Notes (Signed)
Patient ID: Douglas [facility], male   DOB: December 26, 1945, 68 y.o.   MRN: [id number]  PCP: Dr. Elease Mann  HPI: Douglas Mann is a 68 year old with history of 30 pack/yr active smoker, COPD oxygen dependent x 2 yrs, CAD s/p stent at [facility] >10 yrs ago and PCI 2015, HTN, prior GI bleed, ICM and chronic systolic HF.   Patient presented to the ED on 08/13/14 with CP and abdominal pain. He became tachypneic, hypoxic and diaphoretic and was intubated. NSTEMI noted with positive troponin and ST depression, and on 08/16/14 underwent LHC revealing severe 2 V CAD with 100% occluded RCA filled via collaterals and prox LAD 90% stenosis with pre/post stenosis ectasia. EF down to 5-10% and marked LVEDP 45. He underwent high risk PCI to LAD with IABP placement. Was reintubated after IABP removed and extubated again 9/1. Also required milrinone and IV lasix. Later Milrinone stopped. He was transitioned to lasix 40 mg twice a day. He was not placed on BB due to cardiogenic shock.  Had frequent NSVT and ranexa was added to amiodarone and LifeVest ordered. Weight on discharge was 165 pounds.   Follow up for Heart Failure:  Last visit lisinopril 2.5 mg started daily which he tolerated. He is doing well. Weight at home 148-152 lbs. Denies SOB, PND or CP. +chronic orthopnea sleeps in a recliner. +DOE with minimal exertion about 5-10 ft. No dizziness. Appetite down.  Wearing LifeVest and not having any alarms. Wearing 3L O2 24/7. Taking medications as prescribed. Following a low salt diet and drinking less than 2L a day.   Labs (9/15): K 4.2 Creatinine 1.6 Labs (09/10/14): K 4.5 Creatinine 1.72   ROS: All systems negative except as listed in HPI, PMH and Problem List.  SH: Active smoker, lives with daughter  FH: CAD  Past Medical History  Diagnosis Date  . HYPERLIPIDEMIA 10/01/2009  . HYPERTENSION 10/01/2009  . CAD 10/01/2009    Stent at [name] greater than 10 years ago  . PVD 10/01/2009  . ALLERGIC RHINITIS 12/24/2009  . COPD 10/01/2009   . OSTEOARTHRITIS, GENERALIZED, MULTIPLE JOINTS 12/24/2009    Current Outpatient Prescriptions  Medication Sig Dispense Refill  . albuterol (VENTOLIN HFA) 108 (90 BASE) MCG/ACT inhaler Inhale 2 puffs into the lungs every 6 (six) hours as needed. 3 Inhaler 3  . amiodarone (PACERONE) 200 MG tablet Take 1 tablet (200 mg total) by mouth 2 (two) times daily. 60 tablet 3  . aspirin 81 MG tablet Take 81 mg by mouth daily.      Marland Kitchen azelastine (ASTELIN) 0.1 % nasal spray Place 1 spray into both nostrils 2 (two) times daily. Use in each nostril as directed 30 mL 1  . clopidogrel (PLAVIX) 75 MG tablet Take 1 tablet (75 mg total) by mouth daily. 30 tablet 6  . fish oil-omega-3 fatty acids 1000 MG capsule Take 2 g by mouth daily.      . Fluticasone-Salmeterol (ADVAIR) 500-50 MCG/DOSE AEPB Inhale 1 puff into the lungs 2 (two) times daily. 180 each 1  . HYDROcodone-acetaminophen (NORCO) 7.5-325 MG per tablet Take 1 tablet by mouth every 6 (six) hours as needed for moderate pain.    Marland Kitchen lisinopril (ZESTRIL) 2.5 MG tablet Take 1 tablet (2.5 mg total) by mouth daily. 30 tablet 3  . PROCTOSOL HC 2.5 % rectal cream apply rectally twice a day 28.35 g 0  . ranolazine (RANEXA) 500 MG 12 hr tablet Take 1 tablet (500 mg total) by mouth 2 (two) times daily. [name] 3  . triamcinolone cream (KENALOG) 0.1 % Apply topically 2 (two) times daily. 30 g 5   No current facility-administered medications for this encounter.    Filed Vitals:   10/28/14 1423  BP: 142/70  Pulse: 85  Weight: 151 lb 12 oz (68.833 kg)  SpO2: 98%    PHYSICAL EXAM: General:  Chronically ill-appearing, no resp difficulty on 3 liters [place] oxygen. Sitting in wheelchair. Daughter present  HEENT: normal Neck: supple. JVP flat. Carotids 2+ bilaterally; no bruits. No lymphadenopathy or thryomegaly appreciated. Cor: PMI normal. Regular rate & rhythm. No rubs, gallops or murmurs. Wearing Life Vest  Lungs: clear on 3 liters [name].  Abdomen: soft, nontender,  nondistended. No hepatosplenomegaly. No bruits or masses. Good bowel sounds. Extremities: no cyanosis, clubbing, rash, edema Neuro: alert & orientedx3, cranial nerves grossly intact. Moves all 4 extremities w/o difficulty. Affect pleasant.  ASSESSMENT & PLAN:  1) CAD: s/p recent NSTEMI with high risk PCI to LAD (08/16/14).  Has chronically occluded RCA.   - Continue ASA 81 and Plavix. - Restart atorvastatin 40 mg daily. Will need to check lipids in 2 months - no s/s of ischemia 2) Chronic Systolic Heart Failure: ICM,.  EF 30-35% (07/2014)  - NYHA III symptoms and volume status stable. Will continue to hold lasix and instructed to take 20 mg only if weight > 152 lbs. Check BMET and pro-BNP today.  - Not on BB currently. If stable next visit consider adding low dose. - Will stop lisinopril. Starting on Wednesday start Entresto 24-26 (1 tablet) BID. Told to call if any dizziness. Will need BMET in 1-2 weeks.  -  Repeat echo 11/2014 for ICD.  Not CRT candidate with narrow QRS. Patient is not sure if he wants to get ICD. Continue LifeVest currently.  - Reinforced the need and importance of daily weights, a low sodium diet, and fluid restriction (less than 2 L a day). Instructed to call the HF clinic if weight increases more than 3 lbs overnight or 5 lbs in a week.   3)  NSVT: On amiodarone and Ranexa with NSVT in hospital. Decrease amiodarone 200 mg daily. Continue Lifevest until repeat echo next visit.Marland Kitchen  4)  H/o GI bleed: - no BRBPR. 5) COPD: on 3 liters [name] oxygen with rest and 4L with ambulation. Continue to maintain O2 sats >90%  6) CKD stage III - baseline creatinine base 1.6-1.8. Check BMET today.   Follow up in 4-6 weeks with ECHO Douglas Brunt NP-C  2:33 PM  Patient seen and examined with Douglas Bame, NP. We discussed all aspects of the encounter. I agree with the assessment and plan as stated above.   He is improving slowly. Agree with switching to [name]. Use lasix 20 for weight  152 or greater. Continue LifeVest. F/u echo 1 month. He is still unsure if he wants ICD. Can decrease amio.   Benay Spice 3:39 PM

## 2014-10-28 NOTE — Patient Instructions (Addendum)
Stop lisinopril.  On Wednesday start Entresto 24-26 (1 tablet) twice a day. Call any dizziness. Follow up next Tuesday 11/05/14 for labs.  Take lasix 20 mg (1/2 tablet) as needed for weight greater than 152 lbs.  Restart Atorvastatin 40 mg at night.  Change amiodarone to 200 mg daily.  Call any issues.  Follow up in 4-6 weeks with ECHO.  Do the following things EVERYDAY: 1) Weigh yourself in the morning before breakfast. Write it down and keep it in a log. 2) Take your medicines as prescribed 3) Eat low salt foods-Limit salt (sodium) to 2000 mg per day.  4) Stay as active as you can everyday 5) Limit all fluids for the day to less than 2 liters

## 2014-10-29 ENCOUNTER — Telehealth (HOSPITAL_COMMUNITY): Payer: Self-pay | Admitting: Vascular Surgery

## 2014-10-29 NOTE — Telephone Encounter (Signed)
PT daughter called she needs a call back to clarify some medication changes... Please advise

## 2014-10-29 NOTE — Telephone Encounter (Signed)
Spoke w/pt's daughter we went over med changes from yesterday

## 2014-11-05 ENCOUNTER — Ambulatory Visit (HOSPITAL_COMMUNITY)
Admission: RE | Admit: 2014-11-05 | Discharge: 2014-11-05 | Disposition: A | Discharge status: Home / Self Care | Payer: Medicare Other | Source: Ambulatory Visit | Attending: Cardiology | Admitting: Cardiology

## 2014-11-05 DIAGNOSIS — I5022 Chronic systolic (congestive) heart failure: Secondary | ICD-10-CM

## 2014-11-05 LAB — BASIC METABOLIC PANEL
Anion gap: 12 (ref 5–15)
BUN: 10 mg/dL (ref 6–23)
CALCIUM: 9.2 mg/dL (ref 8.4–10.5)
CO2: 28 mEq/L (ref 19–32)
Chloride: 99 mEq/L (ref 96–112)
Creatinine, Ser: 1.14 mg/dL (ref 0.50–1.35)
GFR calc Af Amer: 73 mL/min — ABNORMAL LOW (ref >90)
GFR, EST NON AFRICAN AMERICAN: 63 mL/min — AB (ref >90)
Glucose, Bld: 114 mg/dL — ABNORMAL HIGH (ref 70–99)
POTASSIUM: 4.2 meq/L (ref 3.7–5.3)
Sodium: 139 mEq/L (ref 137–147)

## 2014-11-18 ENCOUNTER — Telehealth: Payer: Self-pay | Admitting: Licensed Clinical Social Worker

## 2014-11-18 NOTE — Telephone Encounter (Signed)
CSW received referral to assist with homecare resources to assist family in caring for patient. CSW contacted daughter Vanita Ingles and message left for return call. Raquel Sarna, [place]

## 2014-11-19 ENCOUNTER — Ambulatory Visit (INDEPENDENT_AMBULATORY_CARE_PROVIDER_SITE_OTHER): Payer: Medicare Other | Admitting: Family Medicine

## 2014-11-19 ENCOUNTER — Encounter: Payer: Self-pay | Admitting: Family Medicine

## 2014-11-19 ENCOUNTER — Telehealth: Payer: Self-pay | Admitting: Licensed Clinical Social Worker

## 2014-11-19 VITALS — BP 130/64 | HR 84 | Temp 97.9°F

## 2014-11-19 DIAGNOSIS — J0100 Acute maxillary sinusitis, unspecified: Secondary | ICD-10-CM

## 2014-11-19 MED ORDER — NEOMYCIN-POLYMYXIN-HC 3.5-10000-1 OP SUSP: 4.0000 [drp] | Freq: Four times a day (QID) | OPHTHALMIC | Status: DC

## 2014-11-19 MED ORDER — DOXYCYCLINE HYCLATE 100 MG PO CAPS: 100.0000 mg | ORAL_CAPSULE | Freq: Two times a day (BID) | ORAL | Status: AC

## 2014-11-19 NOTE — Progress Notes (Signed)
   Subjective:    Patient ID: Douglas [facility], male    DOB: 20-Dec-1946, 68 y.o.   MRN: [id number]  HPI Here for one week of sinus pressure, PND, eyes burning and sticking shut, and ST. No coughing or fever. He thinks he has a sinus infection.    Review of Systems  Constitutional: Negative.   HENT: Positive for congestion, postnasal drip and sinus pressure.   Eyes: Positive for discharge and redness. Negative for visual disturbance.  Respiratory: Negative.        Objective:   Physical Exam  Constitutional: He appears well-developed and well-nourished.  HENT:  Right Ear: External ear normal.  Left Ear: External ear normal.  Nose: Nose normal.  Mouth/Throat: Oropharynx is clear and moist.  Eyes:  Both conjunctivae are pink  Pulmonary/Chest: Effort normal. No respiratory distress. He has no rales.  Scattered wheezes   Lymphadenopathy:    He has no cervical adenopathy.          Assessment & Plan:  Treat with Cortisporin drops and Doxycycline

## 2014-11-19 NOTE — Progress Notes (Signed)
Pre visit review using our clinic review tool, if applicable. No additional management support is needed unless otherwise documented below in the visit note. 

## 2014-11-19 NOTE — Telephone Encounter (Signed)
CSW received return call from daughter Vanita Ingles. She reports that she visits patient 2x weekly and assist with all personal care and medication pre pours. Daughter states that she transports to all medical appointments and managed his care. She reports she has a cleaning service and [employer] delivery although no meals available on the weekends. CSW discussed at length and provided support for daughter who appears to be feeling overwhelmed about the future in the event she is unable to make visits and manage her father's care. CSW provided resources and contact number for [facility] [place]. Daughter verbalizes understanding of call and will follow up with CSW on next clinic visit. Raquel Sarna, [place]

## 2014-11-28 ENCOUNTER — Encounter (HOSPITAL_COMMUNITY): Payer: Self-pay | Admitting: Cardiology

## 2014-12-10 ENCOUNTER — Encounter (HOSPITAL_COMMUNITY): Payer: Self-pay

## 2014-12-10 ENCOUNTER — Ambulatory Visit (HOSPITAL_BASED_OUTPATIENT_CLINIC_OR_DEPARTMENT_OTHER)
Admission: RE | Admit: 2014-12-10 | Discharge: 2014-12-10 | Disposition: A | Discharge status: Home / Self Care | Payer: Medicare Other | Source: Ambulatory Visit | Attending: Internal Medicine | Admitting: Internal Medicine

## 2014-12-10 ENCOUNTER — Telehealth (HOSPITAL_COMMUNITY): Payer: Self-pay | Admitting: *Deleted

## 2014-12-10 ENCOUNTER — Ambulatory Visit (HOSPITAL_COMMUNITY)
Admission: RE | Admit: 2014-12-10 | Discharge: 2014-12-10 | Disposition: A | Discharge status: Home / Self Care | Payer: Medicare Other | Source: Ambulatory Visit | Attending: Family Medicine | Admitting: Family Medicine

## 2014-12-10 VITALS — BP 122/62 | HR 83 | Wt 151.8 lb

## 2014-12-10 DIAGNOSIS — I255 Ischemic cardiomyopathy: Secondary | ICD-10-CM | POA: Insufficient documentation

## 2014-12-10 DIAGNOSIS — I5022 Chronic systolic (congestive) heart failure: Secondary | ICD-10-CM | POA: Diagnosis not present

## 2014-12-10 DIAGNOSIS — J449 Chronic obstructive pulmonary disease, unspecified: Secondary | ICD-10-CM | POA: Insufficient documentation

## 2014-12-10 DIAGNOSIS — N183 Chronic kidney disease, stage 3 unspecified: Secondary | ICD-10-CM

## 2014-12-10 DIAGNOSIS — I2510 Atherosclerotic heart disease of native coronary artery without angina pectoris: Secondary | ICD-10-CM | POA: Insufficient documentation

## 2014-12-10 DIAGNOSIS — I369 Nonrheumatic tricuspid valve disorder, unspecified: Secondary | ICD-10-CM

## 2014-12-10 LAB — BRAIN NATRIURETIC PEPTIDE: B Natriuretic Peptide: 152.6 pg/mL — ABNORMAL HIGH (ref 0.0–100.0)

## 2014-12-10 LAB — BASIC METABOLIC PANEL
Anion gap: 7 (ref 5–15)
BUN: 10 mg/dL (ref 6–23)
CALCIUM: 9.6 mg/dL (ref 8.4–10.5)
CO2: 28 mmol/L (ref 19–32)
Chloride: 100 mEq/L (ref 96–112)
Creatinine, Ser: 1.33 mg/dL (ref 0.50–1.35)
GFR, EST AFRICAN AMERICAN: 61 mL/min — AB (ref >90)
GFR, EST NON AFRICAN AMERICAN: 53 mL/min — AB (ref >90)
Glucose, Bld: 119 mg/dL — ABNORMAL HIGH (ref 70–99)
POTASSIUM: 4.8 mmol/L (ref 3.5–5.1)
Sodium: 135 mmol/L (ref 135–145)

## 2014-12-10 MED ORDER — AMIODARONE HCL 200 MG PO TABS: 100.0000 mg | ORAL_TABLET | Freq: Every day | ORAL | Status: DC

## 2014-12-10 MED ORDER — CARVEDILOL 3.125 MG PO TABS: 3.1250 mg | ORAL_TABLET | Freq: Two times a day (BID) | ORAL | Status: DC

## 2014-12-10 NOTE — Progress Notes (Signed)
*  PRELIMINARY RESULTS* Echocardiogram 2D Echocardiogram has been performed.  Leavy Cella 12/10/2014, 2:33 PM

## 2014-12-10 NOTE — Telephone Encounter (Signed)
Called [employer] to report we have d/c'd pt's Life Vest, they will contact pt to retrieve equipment

## 2014-12-10 NOTE — Patient Instructions (Addendum)
Decrease Amiodarone to 100 mg daily  Start Carvedilol 3.125 mg Twice daily   Labs today  Stop Life Vest, the company is aware and will send you a box to return vest in  Your physician recommends that you schedule a follow-up appointment in: 6 weeks

## 2014-12-10 NOTE — Progress Notes (Signed)
Patient ID: Douglas [facility], male   DOB: Aug 23, 1946, 68 y.o.   MRN: [id number]  PCP: Dr. Elease Hashimoto  HPI: Douglas Mann is a 68 year old with history of 32 pack/yr active smoker, COPD oxygen dependent x 2 yrs, CAD s/p stent at [facility] >10 yrs ago and PCI 2015, HTN, prior GI bleed, ICM and chronic systolic HF.   Patient presented to the ED on 08/13/14 with CP and abdominal pain. He became tachypneic, hypoxic and diaphoretic and was intubated. NSTEMI noted with positive troponin and ST depression, and on 08/16/14 underwent LHC revealing severe 2 V CAD with 100% occluded RCA filled via collaterals and prox LAD 90% stenosis with pre/post stenosis ectasia. EF down to 5-10% and marked LVEDP 45. He underwent high risk PCI to LAD with IABP placement. Was reintubated after IABP removed and extubated again 9/1. Also required milrinone and IV lasix. Later Milrinone stopped. He was transitioned to lasix 40 mg twice a day. He was not placed on BB due to cardiogenic shock.  Had frequent NSVT and ranexa was added to amiodarone and LifeVest ordered. Weight on discharge was 165 pounds.   Follow up for Heart Failure:  Last visit stopped lisinopril and started Entresto 24-26 which he tolerated. Overall feeling pretty good. Denies CP, LE edema or PND. +orthopnea and DOE with minimal exertion. Appetite improving. Sleeps in recliner. Wearing 3L O2 with rest and 4L with ambulation. Taking medications as prescribed. Following a low salt diet and drinking less than 2L a day.    Labs (9/15): K 4.2 Creatinine 1.6 Labs (09/10/14): K 4.5 Creatinine 1.72   ROS: All systems negative except as listed in HPI, PMH and Problem List.  SH: Active smoker, lives with daughter  FH: CAD  Past Medical History  Diagnosis Date  . HYPERLIPIDEMIA 10/01/2009  . HYPERTENSION 10/01/2009  . CAD 10/01/2009    Stent at [place] greater than 10 years ago  . PVD 10/01/2009  . ALLERGIC RHINITIS 12/24/2009  . COPD 10/01/2009  . OSTEOARTHRITIS, GENERALIZED, MULTIPLE  JOINTS 12/24/2009    Current Outpatient Prescriptions  Medication Sig Dispense Refill  . albuterol (VENTOLIN HFA) 108 (90 BASE) MCG/ACT inhaler Inhale 2 puffs into the lungs every 6 (six) hours as needed. 3 Inhaler 3  . amiodarone (PACERONE) 200 MG tablet Take 1 tablet (200 mg total) by mouth daily. 30 tablet 3  . aspirin 81 MG tablet Take 81 mg by mouth daily.      Marland Kitchen atorvastatin (LIPITOR) 40 MG tablet Take 1 tablet (40 mg total) by mouth daily. 30 tablet 3  . azelastine (ASTELIN) 0.1 % nasal spray Place 1 spray into both nostrils 2 (two) times daily. Use in each nostril as directed 30 mL 1  . clopidogrel (PLAVIX) 75 MG tablet Take 1 tablet (75 mg total) by mouth daily. 30 tablet 6  . fish oil-omega-3 fatty acids 1000 MG capsule Take 2 g by mouth daily.      . Fluticasone-Salmeterol (ADVAIR) 500-50 MCG/DOSE AEPB Inhale 1 puff into the lungs 2 (two) times daily. 180 each 1  . furosemide (LASIX) 20 MG tablet Take 1 tablet (20 mg total) by mouth as needed (for weight greater than 152 lbs). 15 tablet 3  . HYDROcodone-acetaminophen (NORCO) 7.5-325 MG per tablet Take 1 tablet by mouth every 6 (six) hours as needed for moderate pain.    Marland Kitchen neomycin-polymyxin-hydrocortisone (CORTISPORIN) 3.5-10000-1 ophthalmic suspension Place 4 drops into both eyes 4 (four) times daily. 7.5 mL 0  . PROCTOSOL HC 2.5 %  rectal cream apply rectally twice a day 28.35 g 0  . ranolazine (RANEXA) 500 MG 12 hr tablet Take 1 tablet (500 mg total) by mouth 2 (two) times daily. 60 tablet 3  . Sacubitril-Valsartan (ENTRESTO) 24-26 MG TABS Take 1 tablet by mouth 2 (two) times daily. 60 tablet 3  . triamcinolone cream (KENALOG) 0.1 % Apply topically 2 (two) times daily. 30 g 5   No current facility-administered medications for this encounter.    Filed Vitals:   12/10/14 1447  BP: 122/62  Pulse: 83  Weight: 151 lb 12.8 oz (68.856 kg)  SpO2: 99%    PHYSICAL EXAM: General:  Chronically ill-appearing, no resp difficulty on 3  liters [place] oxygen. Sitting in wheelchair. Daughter present  HEENT: normal Neck: supple. JVP flat. Carotids 2+ bilaterally; no bruits. No lymphadenopathy or thryomegaly appreciated. Cor: PMI normal. Regular rate & rhythm. No rubs, gallops or murmurs. Wearing Life Vest  Lungs: clear on 3 liters [place].  Abdomen: soft, nontender, nondistended. No hepatosplenomegaly. No bruits or masses. Good bowel sounds. Extremities: no cyanosis, clubbing, rash, edema Neuro: alert & orientedx3, cranial nerves grossly intact. Moves all 4 extremities w/o difficulty. Affect pleasant.  ASSESSMENT & PLAN:  1) CAD: s/p recent NSTEMI with high risk PCI to LAD (08/16/14).  Has chronically occluded RCA.   - Continue ASA 81 and Plavix. - Restart atorvastatin 40 mg daily. Will need to check lipids next visit.  - no s/s of ischemia 2) Chronic Systolic Heart Failure: ICM, 35% (11/2014)  - NYHA III symptoms and volume status stable. Continue to only take lasix 20 mg if weight > 152 lbs. Check BMET today. - Start coreg 3.125 mg BID.  - Continue current dose of Entresto as above check BMET today.  -  Dr. Haroldine Laws reviewed Echo and EF 35% much improved. Patient can discontinue LifeVest.  - Reinforced the need and importance of daily weights, a low sodium diet, and fluid restriction (less than 2 L a day). Instructed to call the HF clinic if weight increases more than 3 lbs overnight or 5 lbs in a week.   3)  NSVT: On amiodarone and Ranexa with NSVT in hospital. Decrease amiodarone 100 mg daily.  4)  H/o GI bleed: - no BRBPR. 5) COPD: on 3 liters [place] oxygen with rest and 4L with ambulation. Continue to maintain O2 sats >90%  6) CKD stage III - baseline creatinine base 1.6-1.8.   F/U 6 weeks Junie Bame B NP-C  2:54 PM

## 2014-12-11 ENCOUNTER — Telehealth (HOSPITAL_COMMUNITY): Payer: Self-pay | Admitting: *Deleted

## 2014-12-11 NOTE — Telephone Encounter (Signed)
Called [phone] (pt's ID [id number]) and completed PA for Entresto, medication was approved 12/11/14-12/12/15, [facility] aware

## 2015-01-01 ENCOUNTER — Ambulatory Visit (INDEPENDENT_AMBULATORY_CARE_PROVIDER_SITE_OTHER): Payer: PRIVATE HEALTH INSURANCE | Admitting: Family Medicine

## 2015-01-01 ENCOUNTER — Encounter: Payer: Self-pay | Admitting: Family Medicine

## 2015-01-01 VITALS — BP 124/66 | HR 82 | Temp 97.6°F | Wt 154.0 lb

## 2015-01-01 DIAGNOSIS — M159 Polyosteoarthritis, unspecified: Secondary | ICD-10-CM

## 2015-01-01 DIAGNOSIS — J449 Chronic obstructive pulmonary disease, unspecified: Secondary | ICD-10-CM

## 2015-01-01 DIAGNOSIS — E785 Hyperlipidemia, unspecified: Secondary | ICD-10-CM

## 2015-01-01 DIAGNOSIS — I255 Ischemic cardiomyopathy: Secondary | ICD-10-CM

## 2015-01-01 MED ORDER — HYDROCODONE-ACETAMINOPHEN 7.5-325 MG PO TABS: 1.0000 | ORAL_TABLET | Freq: Four times a day (QID) | ORAL | Status: DC | PRN

## 2015-01-01 NOTE — Progress Notes (Signed)
Pre visit review using our clinic review tool, if applicable. No additional management support is needed unless otherwise documented below in the visit note. 

## 2015-01-01 NOTE — Progress Notes (Signed)
Subjective:    Patient ID: Douglas Mann, male    DOB: 04-28-1946, 69 y.o.   MRN: [id number]  HPI Patient seen for medical follow-up. He has multiple medical problems including history of CAD, peripheral vascular disease, hypertension, hyperlipidemia, chronic kidney disease, chronic systolic heart failure, ischemic cardiomyopathy with severe reduction in EF. He had admission back in August for chest pain and non-STEMI. Patient had severe two-vessel school CAD with 100% occlusion RCA and proximal LAD 90%. He's been followed closely by cardiology since then. He does daily weights. Weight has been stable. He has chronic dyspnea which is unchanged. Chronic oxygen therapy. He was placed on Entresto and having issues with cost which is trying to work out with cardiology. He is compliant with all medications  He had flu vaccine. He declines Prevnar 13. He has some chronic hip pain with hx of osteoarthritis. Rarely takes hydrocodone and requesting refills. Only occasional constipation issues. No recent chest pains.    Past Medical History  Diagnosis Date  . HYPERLIPIDEMIA 10/01/2009  . HYPERTENSION 10/01/2009  . CAD 10/01/2009    Stent at [place] greater than 10 years ago  . PVD 10/01/2009  . ALLERGIC RHINITIS 12/24/2009  . COPD 10/01/2009  . OSTEOARTHRITIS, GENERALIZED, MULTIPLE JOINTS 12/24/2009   Past Surgical History  Procedure Laterality Date  . Fracture surgery  2012    ORIF r tibia fracture  . Tonsillectomy and adenoidectomy    . Left heart cath Bilateral 08/15/2014    Procedure: LEFT HEART CATH;  Surgeon: Leonie Man, MD;  Location: [facility] CATH LAB;  Service: Cardiovascular;  Laterality: Bilateral;  . Percutaneous coronary stent intervention (pci-s) N/A 08/16/2014    Procedure: PERCUTANEOUS CORONARY STENT INTERVENTION (PCI-S);  Surgeon: Leonie Man, MD;  Location: [facility] CATH LAB;  Service: Cardiovascular;  Laterality: N/A;  . Cardiac catheterization  08/16/2014    Procedure: IABP INSERTION;   Surgeon: Leonie Man, MD;  Location: [facility] CATH LAB;  Service: Cardiovascular;;    reports that he has quit smoking. His smoking use included Cigarettes. He has a 75 pack-year smoking history. He has never used smokeless tobacco. He reports that he does not drink alcohol or use illicit drugs. family history includes CAD in his father. No Known Allergies    Review of Systems  Constitutional: Positive for fatigue.  Eyes: Negative for visual disturbance.  Respiratory: Positive for shortness of breath. Negative for cough and chest tightness.   Cardiovascular: Negative for chest pain, palpitations and leg swelling.  Neurological: Negative for dizziness, syncope, weakness, light-headedness and headaches.       Objective:   Physical Exam  Constitutional: He is oriented to person, place, and time. He appears well-developed and well-nourished.  HENT:  Right Ear: External ear normal.  Left Ear: External ear normal.  Mouth/Throat: Oropharynx is clear and moist.  Eyes: Pupils are equal, round, and reactive to light.  Neck: Neck supple. No thyromegaly present.  Cardiovascular: Normal rate and regular rhythm.   Pulmonary/Chest: Effort normal and breath sounds normal. No respiratory distress. He has no wheezes. He has no rales.  Somewhat diminished breath sounds throughout but clear  Musculoskeletal: He exhibits no edema.  Neurological: He is alert and oriented to person, place, and time.          Assessment & Plan:  #1 history of CAD with ischemic cardiomyopathy and chronic systolic heart failure. Continue close follow up with cardiology. Continue daily weights. We explained he will need monitoring of thyroid functions on amiodarone  and he wishes to defer to cardiology  #2 dyslipidemia. He plans to get follow-up lipids per cardiology at follow-up visit  #3 osteoarthritis involving multiple joints. Refill hydrocodone 7.5 mg 1 every 6 hours for severe pain #60. He generally takes about 60  tablets per year and uses very rarely for severe pain. No history of misuse. #4 COPD.  Chronic oxygen use.  Continue Advair.  Recommended Prevnar 13 and he declines.

## 2015-01-02 ENCOUNTER — Telehealth (HOSPITAL_COMMUNITY): Payer: Self-pay | Admitting: Vascular Surgery

## 2015-01-02 NOTE — Telephone Encounter (Addendum)
Pt daughter called pt has a week worth Entrusto left pt can not afford this medication.. 240 co pay pt needs assistants with medication Please advise

## 2015-01-02 NOTE — Telephone Encounter (Signed)
Douglas Mann resident working on prior Douglas Mann, patient aware.  Daughter will pick up 2 wk sample pack up tomorrow.  Douglas Mann

## 2015-01-27 ENCOUNTER — Ambulatory Visit (HOSPITAL_COMMUNITY)
Admission: RE | Admit: 2015-01-27 | Discharge: 2015-01-27 | Disposition: A | Discharge status: Home / Self Care | Payer: Medicare Other | Source: Ambulatory Visit | Attending: Internal Medicine | Admitting: Internal Medicine

## 2015-01-27 VITALS — BP 120/72 | HR 83 | Wt 154.8 lb

## 2015-01-27 DIAGNOSIS — I255 Ischemic cardiomyopathy: Secondary | ICD-10-CM | POA: Diagnosis not present

## 2015-01-27 DIAGNOSIS — I471 Supraventricular tachycardia: Secondary | ICD-10-CM | POA: Diagnosis not present

## 2015-01-27 DIAGNOSIS — I2510 Atherosclerotic heart disease of native coronary artery without angina pectoris: Secondary | ICD-10-CM | POA: Diagnosis not present

## 2015-01-27 DIAGNOSIS — N183 Chronic kidney disease, stage 3 unspecified: Secondary | ICD-10-CM

## 2015-01-27 DIAGNOSIS — I5022 Chronic systolic (congestive) heart failure: Secondary | ICD-10-CM | POA: Diagnosis not present

## 2015-01-27 DIAGNOSIS — J449 Chronic obstructive pulmonary disease, unspecified: Secondary | ICD-10-CM | POA: Diagnosis not present

## 2015-01-27 LAB — BASIC METABOLIC PANEL
ANION GAP: 2 — AB (ref 5–15)
BUN: 13 mg/dL (ref 6–23)
CHLORIDE: 98 mmol/L (ref 96–112)
CO2: 36 mmol/L — ABNORMAL HIGH (ref 19–32)
Calcium: 9.5 mg/dL (ref 8.4–10.5)
Creatinine, Ser: 1.39 mg/dL — ABNORMAL HIGH (ref 0.50–1.35)
GFR, EST AFRICAN AMERICAN: 58 mL/min — AB (ref >90)
GFR, EST NON AFRICAN AMERICAN: 50 mL/min — AB (ref >90)
Glucose, Bld: 110 mg/dL — ABNORMAL HIGH (ref 70–99)
Potassium: 4.4 mmol/L (ref 3.5–5.1)
SODIUM: 136 mmol/L (ref 135–145)

## 2015-01-27 MED ORDER — FUROSEMIDE 20 MG PO TABS: 20.0000 mg | ORAL_TABLET | ORAL | Status: DC | PRN

## 2015-01-27 MED ORDER — RANOLAZINE ER 500 MG PO TB12: 500.0000 mg | ORAL_TABLET | Freq: Two times a day (BID) | ORAL | Status: DC

## 2015-01-27 MED ORDER — AMIODARONE HCL 200 MG PO TABS
100.0000 mg | ORAL_TABLET | Freq: Every day | ORAL | Status: DC
Start: 2015-01-27 — End: 2015-06-11

## 2015-01-27 MED ORDER — SACUBITRIL-VALSARTAN 49-51 MG PO TABS: 1.0000 | ORAL_TABLET | Freq: Two times a day (BID) | ORAL | Status: DC

## 2015-01-27 NOTE — Progress Notes (Signed)
Patient ID: Douglas Mann, male   DOB: 08-30-1946, 69 y.o.   MRN: [id number]  PCP: Dr. Elease Hashimoto  HPI: Douglas Mann is a 69 year old with history of 58 pack/yr active smoker, COPD oxygen dependent x 2 yrs, CAD s/p stent at [facility] >10 yrs ago and PCI 2015, HTN, prior GI bleed, ICM and chronic systolic HF.   Patient presented to the ED on 08/13/14 with CP and abdominal pain. He became tachypneic, hypoxic and diaphoretic and was intubated. NSTEMI noted with positive troponin and ST depression, and on 08/16/14 underwent LHC revealing severe 2 V CAD with 100% occluded RCA filled via collaterals and prox LAD 90% stenosis with pre/post stenosis ectasia. EF down to 5-10% and marked LVEDP 45. He underwent high risk PCI to LAD with IABP placement. Was reintubated after IABP removed and extubated again 9/1. Also required milrinone and IV lasix. Later Milrinone stopped. He was transitioned to lasix 40 mg twice a day. He was not placed on BB due to cardiogenic shock.  Had frequent NSVT and ranexa was added to amiodarone and LifeVest ordered. Weight on discharge was 165 pounds.   Follow up for Heart Failure:  Last visit carvedilol was started. He continues on 3 liters oxygen. Not taking lasix. Weight at home 151-153 pounds.Taking lasix for weight 152 or greater.  Mild dyspnea with exertion. Taking medications as prescribed. Following a low salt diet and drinking less than 2L a day.     ECHO 11/2014 EF 450-50%   Labs (9/15): K 4.2 Creatinine 1.6 Labs (09/10/14): K 4.5 Creatinine 1.72  Labs 12/10/2014: K 4.8 Creatinine 1.33    ROS: All systems negative except as listed in HPI, PMH and Problem List.  SH: Active smoker, lives with daughter  FH: CAD  Past Medical History  Diagnosis Date  . HYPERLIPIDEMIA 10/01/2009  . HYPERTENSION 10/01/2009  . CAD 10/01/2009    Stent at [place] greater than 10 years ago  . PVD 10/01/2009  . ALLERGIC RHINITIS 12/24/2009  . COPD 10/01/2009  . OSTEOARTHRITIS, GENERALIZED, MULTIPLE JOINTS  12/24/2009    Current Outpatient Prescriptions  Medication Sig Dispense Refill  . albuterol (VENTOLIN HFA) 108 (90 BASE) MCG/ACT inhaler Inhale 2 puffs into the lungs every 6 (six) hours as needed. 3 Inhaler 3  . amiodarone (PACERONE) 200 MG tablet Take 0.5 tablets (100 mg total) by mouth daily. (Patient taking differently: Take 200 mg by mouth daily. ) 30 tablet 3  . aspirin 81 MG tablet Take 81 mg by mouth daily.      Marland Kitchen atorvastatin (LIPITOR) 40 MG tablet Take 1 tablet (40 mg total) by mouth daily. 30 tablet 3  . azelastine (ASTELIN) 0.1 % nasal spray Place 1 spray into both nostrils 2 (two) times daily. Use in each nostril as directed 30 mL 1  . carvedilol (COREG) 3.125 MG tablet Take 1 tablet (3.125 mg total) by mouth 2 (two) times daily. 60 tablet 3  . clopidogrel (PLAVIX) 75 MG tablet Take 1 tablet (75 mg total) by mouth daily. 30 tablet 6  . fish oil-omega-3 fatty acids 1000 MG capsule Take 2 g by mouth daily.      . Fluticasone-Salmeterol (ADVAIR) 500-50 MCG/DOSE AEPB Inhale 1 puff into the lungs 2 (two) times daily. 180 each 1  . HYDROcodone-acetaminophen (NORCO) 7.5-325 MG per tablet Take 1 tablet by mouth every 6 (six) hours as needed for moderate pain. 60 tablet 0  . neomycin-polymyxin-hydrocortisone (CORTISPORIN) 3.5-10000-1 ophthalmic suspension Place 4 drops into both eyes 4 (four)  times daily. 7.5 mL 0  . PROCTOSOL HC 2.5 % rectal cream apply rectally twice a day 28.35 g 0  . ranolazine (RANEXA) 500 MG 12 hr tablet Take 1 tablet (500 mg total) by mouth 2 (two) times daily. 60 tablet 3  . Sacubitril-Valsartan (ENTRESTO) 24-26 MG TABS Take 1 tablet by mouth 2 (two) times daily. 60 tablet 3  . triamcinolone cream (KENALOG) 0.1 % Apply topically 2 (two) times daily. 30 g 5  . furosemide (LASIX) 20 MG tablet Take 1 tablet (20 mg total) by mouth as needed (for weight greater than 152 lbs). (Patient not taking: Reported on 01/01/2015) 15 tablet 3   No current facility-administered  medications for this encounter.    Filed Vitals:   01/27/15 1454  BP: 120/72  Pulse: 83  Weight: 154 lb 12.8 oz (70.217 kg)  SpO2: 94%    PHYSICAL EXAM: General:  Chronically ill-appearing, no resp difficulty on 3 liters [place] oxygen. Sitting in wheelchair. Daughter present  HEENT: normal Neck: supple. JVP 5-6. Carotids 2+ bilaterally; no bruits. No lymphadenopathy or thryomegaly appreciated. Cor: PMI normal. Regular rate & rhythm. No rubs, gallops or murmurs. Wearing Life Vest  Lungs: clear on 3 liters [place].  Abdomen: soft, nontender, nondistended. No hepatosplenomegaly. No bruits or masses. Good bowel sounds. Extremities: no cyanosis, clubbing, rash, edema Neuro: alert & orientedx3, cranial nerves grossly intact. Moves all 4 extremities w/o difficulty. Affect pleasant.  ASSESSMENT & PLAN:  1) CAD: s/p recent NSTEMI with high risk PCI to LAD (08/16/14).  Has chronically occluded RCA.   - Continue ASA 81 and Plavix. - Continue statin. . Will need to check lipids next visit.  - no s/s of ischemia 2) Chronic Systolic Heart Failure: ICM, 45-50%  (11/2014)  - NYHA III symptoms and volume status stable. Continue to only take lasix 20 mg if weight > 152 lbs.  - Continue  coreg 3.125 mg BID.  - Increase Entresto to 49/51 mg twice a day.  .  -   Reinforced the need and importance of daily weights, a low sodium diet, and fluid restriction (less than 2 L a day). Instructed to call the HF clinic if weight increases more than 3 lbs overnight or 5 lbs in a week.   3)  NSVT: On amiodarone and Ranexa with NSVT in hospital. Decrease amiodarone 100 mg daily. Follow LFTs, TFTs. Yearly eye exams.  4)  H/o GI bleed: - no BRBPR. 5) COPD: on 3 liters [place] oxygen with rest and 4L with ambulation. Continue to maintain O2 sats >90%  6) CKD stage III - baseline creatinine base 1.6-1.8.   F/U 6 weeks CLEGG,AMY NP-C  3:01 PM  Patient seen and examined with Darrick Grinder, NP. We discussed all aspects of the  encounter. I agree with the assessment and plan as stated above.   Doing very well. EF improving. Volume status looks good. Agree with increasing Entresto. Reinforced need for daily weights and reviewed use of sliding scale diuretics.  Benay Spice 5:26 PM

## 2015-01-27 NOTE — Patient Instructions (Addendum)
Decrease Amiodarone to 100 mg (1/2 tab) daily  Increase Entresto to 49/51 mg Twice daily, you can take 2 of your tabs until you run out, then we have sent you in a new prescription for 49/51 mg tablets  Take Furosemide (Lasxi) for weight of 152 lb or greater  Labs today  Your physician recommends that you schedule a follow-up appointment in: 6 weeks

## 2015-03-10 ENCOUNTER — Encounter (HOSPITAL_COMMUNITY): Payer: Self-pay

## 2015-03-10 ENCOUNTER — Ambulatory Visit (HOSPITAL_COMMUNITY)
Admission: RE | Admit: 2015-03-10 | Discharge: 2015-03-10 | Disposition: A | Discharge status: Home / Self Care | Payer: Medicare Other | Source: Ambulatory Visit | Attending: Internal Medicine | Admitting: Internal Medicine

## 2015-03-10 VITALS — BP 122/60 | HR 84 | Wt 159.2 lb

## 2015-03-10 DIAGNOSIS — I252 Old myocardial infarction: Secondary | ICD-10-CM | POA: Diagnosis not present

## 2015-03-10 DIAGNOSIS — N183 Chronic kidney disease, stage 3 (moderate): Secondary | ICD-10-CM | POA: Insufficient documentation

## 2015-03-10 DIAGNOSIS — J449 Chronic obstructive pulmonary disease, unspecified: Secondary | ICD-10-CM | POA: Insufficient documentation

## 2015-03-10 DIAGNOSIS — E785 Hyperlipidemia, unspecified: Secondary | ICD-10-CM | POA: Insufficient documentation

## 2015-03-10 DIAGNOSIS — I129 Hypertensive chronic kidney disease with stage 1 through stage 4 chronic kidney disease, or unspecified chronic kidney disease: Secondary | ICD-10-CM | POA: Diagnosis not present

## 2015-03-10 DIAGNOSIS — I472 Ventricular tachycardia: Secondary | ICD-10-CM | POA: Insufficient documentation

## 2015-03-10 DIAGNOSIS — Z7902 Long term (current) use of antithrombotics/antiplatelets: Secondary | ICD-10-CM | POA: Diagnosis not present

## 2015-03-10 DIAGNOSIS — Z79899 Other long term (current) drug therapy: Secondary | ICD-10-CM | POA: Insufficient documentation

## 2015-03-10 DIAGNOSIS — I5022 Chronic systolic (congestive) heart failure: Secondary | ICD-10-CM | POA: Insufficient documentation

## 2015-03-10 DIAGNOSIS — Z7982 Long term (current) use of aspirin: Secondary | ICD-10-CM | POA: Diagnosis not present

## 2015-03-10 DIAGNOSIS — Z9981 Dependence on supplemental oxygen: Secondary | ICD-10-CM | POA: Diagnosis not present

## 2015-03-10 DIAGNOSIS — I2510 Atherosclerotic heart disease of native coronary artery without angina pectoris: Secondary | ICD-10-CM

## 2015-03-10 DIAGNOSIS — F17210 Nicotine dependence, cigarettes, uncomplicated: Secondary | ICD-10-CM | POA: Insufficient documentation

## 2015-03-10 MED ORDER — SACUBITRIL-VALSARTAN 97-103 MG PO TABS: 1.0000 | ORAL_TABLET | Freq: Two times a day (BID) | ORAL | Status: DC

## 2015-03-10 NOTE — Progress Notes (Signed)
Patient ID: Douglas Mann, male   DOB: [birth year], 69 y.o.   MRN: [id number]  PCP: Dr. Elease Hashimoto  HPI: Douglas Mann is a 69 year old with history of 81 pack/yr active smoker, COPD oxygen dependent x 2 yrs, CAD s/p stent at [facility] >10 yrs ago and PCI 2015, HTN, prior GI bleed, ICM and chronic systolic HF.   Patient presented to the ED on 08/13/14 with CP and abdominal pain. He became tachypneic, hypoxic and diaphoretic and was intubated. NSTEMI noted with positive troponin and ST depression, and on 08/16/14 underwent LHC revealing severe 2 V CAD with 100% occluded RCA filled via collaterals and prox LAD 90% stenosis with pre/post stenosis ectasia. EF down to 5-10% and marked LVEDP 45. He underwent high risk PCI to LAD with IABP placement. Was reintubated after IABP removed and extubated again 9/1. Also required milrinone and IV lasix. Later Milrinone stopped. He was transitioned to lasix 40 mg twice a day. He was not placed on BB due to cardiogenic shock.  Had frequent NSVT and ranexa was added to amiodarone and LifeVest ordered. Weight on discharge was 165 pounds.   Follow up for Heart Failure:  Last visit Entresto increased to 100 bid. Feeling fine. Eating a lot - gets [employer] through [employer]. Has gained 2 pounds but says it isn't fluid. Now 153-155.  He continues on 3 liters oxygen. Not taking lasix. Chronic dyspnea with exertion. No edema, CP or dizziness. Taking medications as prescribed.    ECHO 11/2014 EF 450-50%   Labs (9/15): K 4.2 Creatinine 1.6 Labs (09/10/14): K 4.5 Creatinine 1.72  Labs 12/10/2014: K 4.8 Creatinine 1.33  Labs 01/27/15: K 4.4 Creatinine 1.39   ROS: All systems negative except as listed in HPI, PMH and Problem List.  SH: Active smoker, lives with daughter  FH: CAD  Past Medical History  Diagnosis Date  . HYPERLIPIDEMIA 10/01/2009  . HYPERTENSION 10/01/2009  . CAD 10/01/2009    Stent at [place] greater than 10 years ago  . PVD 10/01/2009  . ALLERGIC RHINITIS  12/24/2009  . COPD 10/01/2009  . OSTEOARTHRITIS, GENERALIZED, MULTIPLE JOINTS 12/24/2009    Current Outpatient Prescriptions  Medication Sig Dispense Refill  . albuterol (VENTOLIN HFA) 108 (90 BASE) MCG/ACT inhaler Inhale 2 puffs into the lungs every 6 (six) hours as needed. 3 Inhaler 3  . amiodarone (PACERONE) 200 MG tablet Take 0.5 tablets (100 mg total) by mouth daily. 30 tablet 3  . aspirin 81 MG tablet Take 81 mg by mouth every other day.     Marland Kitchen atorvastatin (LIPITOR) 40 MG tablet Take 1 tablet (40 mg total) by mouth daily. 30 tablet 3  . carvedilol (COREG) 3.125 MG tablet Take 1 tablet (3.125 mg total) by mouth 2 (two) times daily. 60 tablet 3  . clopidogrel (PLAVIX) 75 MG tablet Take 1 tablet (75 mg total) by mouth daily. 30 tablet 6  . fish oil-omega-3 fatty acids 1000 MG capsule Take 1 g by mouth daily.     . Fluticasone-Salmeterol (ADVAIR) 500-50 MCG/DOSE AEPB Inhale 1 puff into the lungs 2 (two) times daily. 180 each 1  . furosemide (LASIX) 40 MG tablet Take 20 mg by mouth 2 (two) times daily.    Marland Kitchen HYDROcodone-acetaminophen (NORCO) 7.5-325 MG per tablet Take 1 tablet by mouth every 6 (six) hours as needed for moderate pain. 60 tablet 0  . ranolazine (RANEXA) 500 MG 12 hr tablet Take 1 tablet (500 mg total) by mouth 2 (two) times  daily. 60 tablet 3  . sacubitril-valsartan (ENTRESTO) 49-51 MG Take 1 tablet by mouth 2 (two) times daily. 60 tablet 3   No current facility-administered medications for this encounter.    Filed Vitals:   03/10/15 1505  BP: 122/60  Pulse: 84  Weight: 159 lb 3.2 oz (72.213 kg)  SpO2: 93%    PHYSICAL EXAM: General:  Chronically ill-appearing, no resp difficulty on 3 liters [place] oxygen. Sitting in wheelchair. Daughter present  HEENT: normal Neck: supple. JVP 5-6. Carotids 2+ bilaterally; no bruits. No lymphadenopathy or thryomegaly appreciated. Cor: PMI normal. Regular rate & rhythm. No rubs, gallops or murmurs.  Lungs: severely decreased air movement  throughout on 3 liters [place].  Abdomen: soft, nontender, nondistended. No hepatosplenomegaly. No bruits or masses. Good bowel sounds. Extremities: no cyanosis, clubbing, rash, edema Neuro: alert & orientedx3, cranial nerves grossly intact. Moves all 4 extremities w/o difficulty. Affect pleasant.  ASSESSMENT & PLAN:  1) Chronic Systolic Heart Failure: ICM, 45-50%  (11/2014)  - NYHA III symptoms and volume status stable. Take lasix 20 mg if weight >= 156 lbs.  - Continue  coreg 3.125 mg BID.  - Increase Entresto to 200 mg bid  -   Reinforced the need and importance of daily weights, a low sodium diet, and fluid restriction (less than 2 L a day). Instructed to call the HF clinic if weight increases more than 3 lbs overnight or 5 lbs in a week.   2) CAD: s/p recent NSTEMI with high risk PCI to LAD (08/16/14).  Has chronically occluded RCA.   - Continue ASA 81 and Plavix. - Continue statin.  - no s/s of ischemia 3)  NSVT: On amiodarone and Ranexa with NSVT in hospital. Continue amiodarone 100 mg daily. Follow LFTs, TFTs. Yearly eye exams.  4)  H/o GI bleed: - no BRBPR. 5) COPD: on 3 liters [place] oxygen with rest and 4L with ambulation. Continue to maintain O2 sats >90%  6) CKD stage III - baseline creatinine base 1.6-1.8. Check BMET today.   F/U 3 months  Benay Spice 3:35 PM

## 2015-03-10 NOTE — Patient Instructions (Signed)
INCREASE Entresto to 97-103mg  tablet twice daily.  Follow up 3 months.  Do the following things EVERYDAY: 1) Weigh yourself in the morning before breakfast. Write it down and keep it in a log. 2) Take your medicines as prescribed 3) Eat low salt foods-Limit salt (sodium) to 2000 mg per day.  4) Stay as active as you can everyday 5) Limit all fluids for the day to less than 2 liters

## 2015-03-10 NOTE — Progress Notes (Signed)
[facility] Medication Review by a Pharmacist  Does the patient  feel that his/her medications are working for him/her?  yes  Has the patient been experiencing any side effects to the medications prescribed?  yes  Does the patient measure his/her own blood pressure or blood glucose at home?  no   Does the patient have any problems obtaining medications due to transportation or finances?   no  Understanding of regimen: good Understanding of indications: good Potential of compliance: good    Pharmacist comments: Pt was previously experiencing increased hemorrhoidal bleeding when taking aspirin 81mg  daily.  Switching to every other day seems to have resolved the bleeding he was experiencing.  He brought his pill bottles in with him today, and we discussed his medicines at length.  He denies missing doses of meds regularly.  I provided him with a new bag for him to store his medicine bottles in since he was using a plastic grocery bag.  Drucie Opitz, PharmD Clinical Pharmacy Resident Pager: [phone] 03/10/2015 3:30 PM

## 2015-03-10 NOTE — Addendum Note (Signed)
Encounter addended by: Effie Berkshire, RN on: 03/10/2015  3:49 PM<BR>     Documentation filed: Medications, Patient Instructions Section, Orders

## 2015-03-13 ENCOUNTER — Other Ambulatory Visit (HOSPITAL_COMMUNITY): Payer: Self-pay

## 2015-03-13 MED ORDER — ATORVASTATIN CALCIUM 40 MG PO TABS: 40.0000 mg | ORAL_TABLET | Freq: Every day | ORAL | Status: DC

## 2015-04-14 ENCOUNTER — Other Ambulatory Visit (HOSPITAL_COMMUNITY): Payer: Self-pay | Admitting: *Deleted

## 2015-04-14 MED ORDER — CARVEDILOL 3.125 MG PO TABS: 3.1250 mg | ORAL_TABLET | Freq: Two times a day (BID) | ORAL | Status: DC

## 2015-04-24 ENCOUNTER — Telehealth: Payer: Self-pay | Admitting: Family Medicine

## 2015-04-24 ENCOUNTER — Other Ambulatory Visit: Payer: Self-pay | Admitting: Family Medicine

## 2015-04-24 DIAGNOSIS — J449 Chronic obstructive pulmonary disease, unspecified: Secondary | ICD-10-CM

## 2015-04-24 MED ORDER — FLUTICASONE-SALMETEROL 500-50 MCG/DOSE IN AEPB: 1.0000 | INHALATION_SPRAY | Freq: Two times a day (BID) | RESPIRATORY_TRACT | Status: DC

## 2015-04-24 NOTE — Telephone Encounter (Signed)
Patient's daughter called back and is aware of orders faxed to [facility].

## 2015-04-24 NOTE — Telephone Encounter (Signed)
Sent order to [facility] at [employer]

## 2015-04-24 NOTE — Telephone Encounter (Signed)
OK 

## 2015-04-24 NOTE — Telephone Encounter (Signed)
Pt daughter is requesting a bigger oxygen tank. Pt tank only holds 4 units of oxygen. Please fax order to [facility]. Please call pt daughter back vera bullard at [phone]

## 2015-05-02 ENCOUNTER — Telehealth: Payer: Self-pay | Admitting: Family Medicine

## 2015-05-02 NOTE — Telephone Encounter (Signed)
1. Vera daughter following up on request for pt to get his advair from [place]  2. Also wants to know status of pt's request for larger O2 tank. Pt wants it to hold more than 6 units

## 2015-05-02 NOTE — Telephone Encounter (Signed)
Pt forms have been faxed and Larger tank order was sent to [facility].

## 2015-05-14 ENCOUNTER — Other Ambulatory Visit (HOSPITAL_COMMUNITY): Payer: Self-pay | Admitting: *Deleted

## 2015-05-14 MED ORDER — CLOPIDOGREL BISULFATE 75 MG PO TABS: 75.0000 mg | ORAL_TABLET | Freq: Every day | ORAL | Status: DC

## 2015-05-21 ENCOUNTER — Encounter (HOSPITAL_COMMUNITY): Payer: Medicare Other

## 2015-06-10 ENCOUNTER — Encounter (HOSPITAL_COMMUNITY): Payer: Medicare Other

## 2015-06-11 ENCOUNTER — Ambulatory Visit (HOSPITAL_COMMUNITY)
Admission: RE | Admit: 2015-06-11 | Discharge: 2015-06-11 | Disposition: A | Discharge status: Home / Self Care | Payer: Medicare Other | Source: Ambulatory Visit | Attending: Internal Medicine | Admitting: Internal Medicine

## 2015-06-11 VITALS — BP 144/62 | HR 79 | Wt 148.5 lb

## 2015-06-11 DIAGNOSIS — I2510 Atherosclerotic heart disease of native coronary artery without angina pectoris: Secondary | ICD-10-CM

## 2015-06-11 DIAGNOSIS — I5022 Chronic systolic (congestive) heart failure: Secondary | ICD-10-CM | POA: Diagnosis not present

## 2015-06-11 LAB — BASIC METABOLIC PANEL
Anion gap: 4 — ABNORMAL LOW (ref 5–15)
BUN: 12 mg/dL (ref 6–20)
CALCIUM: 9.3 mg/dL (ref 8.9–10.3)
CO2: 29 mmol/L (ref 22–32)
Chloride: 101 mmol/L (ref 101–111)
Creatinine, Ser: 1.52 mg/dL — ABNORMAL HIGH (ref 0.61–1.24)
GFR calc Af Amer: 51 mL/min — ABNORMAL LOW (ref >60)
GFR calc non Af Amer: 44 mL/min — ABNORMAL LOW (ref >60)
GLUCOSE: 110 mg/dL — AB (ref 65–99)
Potassium: 5.9 mmol/L — ABNORMAL HIGH (ref 3.5–5.1)
SODIUM: 134 mmol/L — AB (ref 135–145)

## 2015-06-11 MED ORDER — AMIODARONE HCL 200 MG PO TABS: 100.0000 mg | ORAL_TABLET | Freq: Every day | ORAL | Status: DC

## 2015-06-11 MED ORDER — RANOLAZINE ER 500 MG PO TB12: 500.0000 mg | ORAL_TABLET | Freq: Two times a day (BID) | ORAL | Status: DC

## 2015-06-11 NOTE — Patient Instructions (Signed)
Decrease Amiodarone to 100 mg (1/2 tab) daily  Labs today  Your physician recommends that you schedule a follow-up appointment in: 3 months

## 2015-06-11 NOTE — Progress Notes (Signed)
Patient ID: Douglas Mann, male   DOB: 05/11/1946, 69 y.o.   MRN: [id number]  PCP: Dr. Elease Hashimoto  HPI: Douglas Mann is a 69 year old with history of 3 pack/yr active smoker, COPD oxygen dependent x 2 yrs, CAD s/p stent at [facility] >10 yrs ago and PCI 2015, HTN, prior GI bleed, ICM and chronic systolic HF.   Patient presented to the ED on 08/13/14 with CP and abdominal pain. He became tachypneic, hypoxic and diaphoretic and was intubated. NSTEMI noted with positive troponin and ST depression, and on 08/16/14 underwent LHC revealing severe 2 V CAD with 100% occluded RCA filled via collaterals and prox LAD 90% stenosis with pre/post stenosis ectasia. EF down to 5-10% and marked LVEDP 45. He underwent high risk PCI to LAD with IABP placement. Was reintubated after IABP removed and extubated again 9/1. Also required milrinone and IV lasix. Later Milrinone stopped. He was transitioned to lasix 40 mg twice a day. He was not placed on BB due to cardiogenic shock.  Had frequent NSVT and ranexa was added to amiodarone and LifeVest ordered. Weight on discharge was 165 pounds.   Follow up for Heart Failure:   Overall feeling pretty good. Breathing at baseline. Remains SOB with mild to moderate exertion. Sleeps in recliner. Daughter follows him closely. Weight at home 156-158 pounds. He continues on 3 liters oxygen. CP or dizziness. Taking medications as prescribed. Has not needed lasix.  ECHO 11/2014 EF 45-50%   Labs (9/15): K 4.2 Creatinine 1.6 Labs (09/10/14): K 4.5 Creatinine 1.72  Labs 12/10/2014: K 4.8 Creatinine 1.33  Labs 01/27/15: K 4.4 Creatinine 1.39   ROS: All systems negative except as listed in HPI, PMH and Problem List.  SH: Active smoker, lives with daughter  FH: CAD  Past Medical History  Diagnosis Date  . HYPERLIPIDEMIA 10/01/2009  . HYPERTENSION 10/01/2009  . CAD 10/01/2009    Stent at [place] greater than 10 years ago  . PVD 10/01/2009  . ALLERGIC RHINITIS 12/24/2009  . COPD 10/01/2009  .  OSTEOARTHRITIS, GENERALIZED, MULTIPLE JOINTS 12/24/2009    Current Outpatient Prescriptions  Medication Sig Dispense Refill  . albuterol (VENTOLIN HFA) 108 (90 BASE) MCG/ACT inhaler Inhale 2 puffs into the lungs every 6 (six) hours as needed. 3 Inhaler 3  . amiodarone (PACERONE) 200 MG tablet Take 200 mg by mouth daily.    Marland Kitchen aspirin 81 MG tablet Take 81 mg by mouth every other day.     Marland Kitchen atorvastatin (LIPITOR) 40 MG tablet Take 1 tablet (40 mg total) by mouth daily. 90 tablet 3  . carvedilol (COREG) 3.125 MG tablet Take 1 tablet (3.125 mg total) by mouth 2 (two) times daily. 60 tablet 3  . clopidogrel (PLAVIX) 75 MG tablet Take 1 tablet (75 mg total) by mouth daily. 30 tablet 6  . fish oil-omega-3 fatty acids 1000 MG capsule Take 1 g by mouth daily.     . Fluticasone-Salmeterol (ADVAIR) 500-50 MCG/DOSE AEPB Inhale 1 puff into the lungs 2 (two) times daily. 180 each 3  . furosemide (LASIX) 40 MG tablet Take 40 mg by mouth as needed.    Marland Kitchen HYDROcodone-acetaminophen (NORCO) 7.5-325 MG per tablet Take 1 tablet by mouth every 6 (six) hours as needed for moderate pain. 60 tablet 0  . ranolazine (RANEXA) 500 MG 12 hr tablet Take 1 tablet (500 mg total) by mouth 2 (two) times daily. 60 tablet 3  . sacubitril-valsartan (ENTRESTO) 97-103 MG Take 1 tablet by mouth 2 (two) times daily.  60 tablet 3   No current facility-administered medications for this encounter.    Filed Vitals:   06/11/15 1434  BP: 144/62  Pulse: 79  Weight: 148 lb 8 oz (67.359 kg)  SpO2: 97%    PHYSICAL EXAM: General:  No resp difficulty on 3 liters [place] oxygen. Sitting in wheelchair. Daughter present  HEENT: normal Neck: supple. JVP 5-6. Carotids 2+ bilaterally; no bruits. No lymphadenopathy or thryomegaly appreciated. Cor: PMI normal. Regular rate & rhythm. No rubs, gallops or murmurs.  Lungs: decreased air movement throughout on 3 liters [place].  Abdomen: soft, nontender, nondistended. No hepatosplenomegaly. No bruits or masses.  Good bowel sounds. Extremities: no cyanosis, clubbing, rash, edema Neuro: alert & orientedx3, cranial nerves grossly intact. Moves all 4 extremities w/o difficulty. Affect pleasant.  ASSESSMENT & PLAN:  1) Chronic Systolic Heart Failure: ICM, 45-50%  (11/2014)  - NYHA III symptoms and volume status stable. Take lasix 20 mg if weight >= 156 lbs as needed.   - Continue  coreg 3.125 mg BID.  - On Entresto to 97-103 mg bid  - Potassium too high for spiro - Reinforced the need and importance of daily weights, a low sodium diet, and fluid restriction (less than 2 L a day). Instructed to call the HF clinic if weight increases more than 3 lbs overnight or 5 lbs in a week.   2) CAD: s/p recent NSTEMI with high risk PCI to LAD (08/16/14).  Has chronically occluded RCA.   - Continue ASA 81 and Plavix. - Continue statin.  - no s/s of ischemia 3)  NSVT: On amiodarone and Ranexa with NSVT in hospital. Cut back amio 100 mg daily. Follow LFTs, TFTs. Yearly eye exams.  4)  H/o GI bleed: - no BRBPR. 5) COPD: on 3 liters [place] oxygen with rest and 4L with ambulation. Continue to maintain O2 sats >90%  6) CKD stage III - baseline creatinine base 1.6-1.8. Check BMET today.   F/U 3 months  CLEGG,AMY,NP-C 3:02 PM  Patient seen and examined with Darrick Grinder, NP. We discussed all aspects of the encounter. I agree with the assessment and plan as stated above.   Remains stable. NYHA III. Volume status looks good. Agree with decreasing amio. Consider increasing carvedilol at next visit.   Eleisha Branscomb,MD 10:10 AM

## 2015-06-12 ENCOUNTER — Other Ambulatory Visit (HOSPITAL_COMMUNITY): Payer: Medicare Other

## 2015-06-13 ENCOUNTER — Ambulatory Visit (HOSPITAL_COMMUNITY)
Admission: RE | Admit: 2015-06-13 | Discharge: 2015-06-13 | Disposition: A | Discharge status: Home / Self Care | Payer: Medicare Other | Source: Ambulatory Visit | Attending: Cardiology | Admitting: Cardiology

## 2015-06-13 DIAGNOSIS — I5022 Chronic systolic (congestive) heart failure: Secondary | ICD-10-CM | POA: Diagnosis not present

## 2015-06-13 LAB — BASIC METABOLIC PANEL
Anion gap: 6 (ref 5–15)
BUN: 12 mg/dL (ref 6–20)
CALCIUM: 9.4 mg/dL (ref 8.9–10.3)
CO2: 30 mmol/L (ref 22–32)
Chloride: 100 mmol/L — ABNORMAL LOW (ref 101–111)
Creatinine, Ser: 1.58 mg/dL — ABNORMAL HIGH (ref 0.61–1.24)
GFR calc Af Amer: 49 mL/min — ABNORMAL LOW (ref >60)
GFR calc non Af Amer: 42 mL/min — ABNORMAL LOW (ref >60)
Glucose, Bld: 104 mg/dL — ABNORMAL HIGH (ref 65–99)
Potassium: 5.4 mmol/L — ABNORMAL HIGH (ref 3.5–5.1)
Sodium: 136 mmol/L (ref 135–145)

## 2015-07-16 ENCOUNTER — Ambulatory Visit (HOSPITAL_COMMUNITY)
Admission: RE | Admit: 2015-07-16 | Discharge: 2015-07-16 | Disposition: A | Discharge status: Home / Self Care | Payer: Medicare Other | Source: Ambulatory Visit | Attending: Internal Medicine | Admitting: Internal Medicine

## 2015-07-16 DIAGNOSIS — I5022 Chronic systolic (congestive) heart failure: Secondary | ICD-10-CM | POA: Diagnosis present

## 2015-07-16 LAB — BASIC METABOLIC PANEL
Anion gap: 8 (ref 5–15)
BUN: 11 mg/dL (ref 6–20)
CHLORIDE: 95 mmol/L — AB (ref 101–111)
CO2: 29 mmol/L (ref 22–32)
Calcium: 9.2 mg/dL (ref 8.9–10.3)
Creatinine, Ser: 1.29 mg/dL — ABNORMAL HIGH (ref 0.61–1.24)
GFR calc Af Amer: >60 mL/min (ref >60)
GFR calc non Af Amer: 54 mL/min — ABNORMAL LOW (ref >60)
GLUCOSE: 102 mg/dL — AB (ref 65–99)
Potassium: 4.3 mmol/L (ref 3.5–5.1)
SODIUM: 132 mmol/L — AB (ref 135–145)

## 2015-08-19 ENCOUNTER — Other Ambulatory Visit (HOSPITAL_COMMUNITY): Payer: Self-pay | Admitting: *Deleted

## 2015-08-19 MED ORDER — CARVEDILOL 3.125 MG PO TABS: 3.1250 mg | ORAL_TABLET | Freq: Two times a day (BID) | ORAL | Status: DC

## 2015-08-28 ENCOUNTER — Telehealth: Payer: Self-pay | Admitting: Family Medicine

## 2015-08-28 NOTE — Telephone Encounter (Signed)
Patient Name: Douglas Mann DOB: Jun 26, 1946 Initial Comment Caller states he looked on [employer], and thinks he may have Malaria. Chills and sweating. Nurse Assessment Nurse: Marcelline Deist, RN, Lynda Date/Time (Eastern Time): 08/28/2015 11:43:46 AM Confirm and document reason for call. If symptomatic, describe symptoms. ---Caller states he looked on [employer], and thinks he may have Malaria. Has chills, sweating & weakness. Has had symptoms for 2 years. Had a heart attack a few months ago. Is on O2. Has the patient traveled out of the country within the last 30 days? ---No Does the patient require triage? ---Yes Related visit to physician within the last 2 weeks? ---No Does the PT have any chronic conditions? (i.e. diabetes, asthma, etc.) ---Yes List chronic conditions. ---heart attack, on BP rx, other unknown conditions Guidelines Guideline Title Affirmed Question Affirmed Notes Sweating [1] NIGHT SWEATS occur (e.g., drenching sweat that occurs at night and has to change bed clothes or bed sheets) AND [2] cause unknown Final Disposition User See Physician within [place], RN, Kermit Balo Comments Caller feels sure he has malaria. Is not sure about transportation at this time, so does not want nurse to schedule him until he can find out if he has a ride. Will call back to schedule. Referrals REFERRED TO PCP OFFICE Disagree/Comply: Comply

## 2015-08-28 NOTE — Telephone Encounter (Signed)
Unless he has traveled out of the country to high endemic area in the past year it is highly unlikely has malaria. If he is having recurrent fevers and chills needs to be further assessed with office visit

## 2015-08-28 NOTE — Telephone Encounter (Signed)
Patient is aware and has an appointment.

## 2015-08-29 ENCOUNTER — Ambulatory Visit (INDEPENDENT_AMBULATORY_CARE_PROVIDER_SITE_OTHER): Payer: Medicare Other | Admitting: Family Medicine

## 2015-08-29 ENCOUNTER — Encounter: Payer: Self-pay | Admitting: Family Medicine

## 2015-08-29 VITALS — BP 130/80 | HR 85 | Temp 97.6°F | Wt 155.0 lb

## 2015-08-29 DIAGNOSIS — Z23 Encounter for immunization: Secondary | ICD-10-CM

## 2015-08-29 DIAGNOSIS — Z111 Encounter for screening for respiratory tuberculosis: Secondary | ICD-10-CM

## 2015-08-29 DIAGNOSIS — R6883 Chills (without fever): Secondary | ICD-10-CM

## 2015-08-29 DIAGNOSIS — R5382 Chronic fatigue, unspecified: Secondary | ICD-10-CM | POA: Diagnosis not present

## 2015-08-29 LAB — TSH: TSH: 10.44 u[IU]/mL — ABNORMAL HIGH (ref 0.35–4.50)

## 2015-08-29 LAB — HEPATIC FUNCTION PANEL
ALBUMIN: 4.2 g/dL (ref 3.5–5.2)
ALK PHOS: 107 U/L (ref 39–117)
ALT: 23 U/L (ref 0–53)
AST: 19 U/L (ref 0–37)
Bilirubin, Direct: 0.2 mg/dL (ref 0.0–0.3)
Total Bilirubin: 0.5 mg/dL (ref 0.2–1.2)
Total Protein: 7.6 g/dL (ref 6.0–8.3)

## 2015-08-29 LAB — CBC WITH DIFFERENTIAL/PLATELET
BASOS PCT: 0.3 % (ref 0.0–3.0)
Basophils Absolute: 0 10*3/uL (ref 0.0–0.1)
EOS PCT: 1.2 % (ref 0.0–5.0)
Eosinophils Absolute: 0.1 10*3/uL (ref 0.0–0.7)
HCT: 39.1 % (ref 39.0–52.0)
Hemoglobin: 12.9 g/dL — ABNORMAL LOW (ref 13.0–17.0)
Lymphocytes Relative: 27.2 % (ref 12.0–46.0)
Lymphs Abs: 2.4 10*3/uL (ref 0.7–4.0)
MCHC: 33 g/dL (ref 30.0–36.0)
MCV: 91.5 fl (ref 78.0–100.0)
MONO ABS: 1 10*3/uL (ref 0.1–1.0)
MONOS PCT: 11.4 % (ref 3.0–12.0)
NEUTROS PCT: 59.9 % (ref 43.0–77.0)
Neutro Abs: 5.4 10*3/uL (ref 1.4–7.7)
Platelets: 234 10*3/uL (ref 150.0–400.0)
RBC: 4.27 Mil/uL (ref 4.22–5.81)
RDW: 13.8 % (ref 11.5–15.5)
WBC: 9 10*3/uL (ref 4.0–10.5)

## 2015-08-29 LAB — SEDIMENTATION RATE: SED RATE: 35 mm/h — AB (ref 0–22)

## 2015-08-29 NOTE — Progress Notes (Signed)
Pre visit review using our clinic review tool, if applicable. No additional management support is needed unless otherwise documented below in the visit note. 

## 2015-08-29 NOTE — Progress Notes (Signed)
   Subjective:    Patient ID: Douglas Mann, male    DOB: [birth year], 69 y.o.   MRN: [id number]  HPI Patient had called couple days ago with concerns that he may have "malaria ". He has not traveled out of this country- ever. He apparently read about malaria symptoms and is convinced he has this. His main symptom is he has intermittent chills and occasional sweats. He's had occasional nausea without vomiting. No abdominal pain. No stool changes. He had his symptoms for at least a year and possibly more. His appetite and weight have been stable. He has severe chronic lung disease on chronic oxygen. He has occasional myalgias.  Chronic problems include hypertension, peripheral vascular disease, end-stage COPD, CAD. He is very limited in activity levels because of his overall chronic medical problems. No recent sick exposures. He does take amiodarone and no recent thyroid functions. Other medications reviewed.  Past Medical History  Diagnosis Date  . HYPERLIPIDEMIA 10/01/2009  . HYPERTENSION 10/01/2009  . CAD 10/01/2009    Stent at [place] greater than 10 years ago  . PVD 10/01/2009  . ALLERGIC RHINITIS 12/24/2009  . COPD 10/01/2009  . OSTEOARTHRITIS, GENERALIZED, MULTIPLE JOINTS 12/24/2009   Past Surgical History  Procedure Laterality Date  . Fracture surgery  2012    ORIF r tibia fracture  . Tonsillectomy and adenoidectomy    . Left heart cath Bilateral 08/15/2014    Procedure: LEFT HEART CATH;  Surgeon: Leonie Man, MD;  Location: [facility] CATH LAB;  Service: Cardiovascular;  Laterality: Bilateral;  . Percutaneous coronary stent intervention (pci-s) N/A 08/16/2014    Procedure: PERCUTANEOUS CORONARY STENT INTERVENTION (PCI-S);  Surgeon: Leonie Man, MD;  Location: [facility] CATH LAB;  Service: Cardiovascular;  Laterality: N/A;  . Cardiac catheterization  08/16/2014    Procedure: IABP INSERTION;  Surgeon: Leonie Man, MD;  Location: [facility] CATH LAB;  Service: Cardiovascular;;    reports that he has quit  smoking. His smoking use included Cigarettes. He has a 75 pack-year smoking history. He has never used smokeless tobacco. He reports that he does not drink alcohol or use illicit drugs. family history includes CAD in his father. No Known Allergies    Review of Systems  Constitutional: Positive for chills and fatigue. Negative for fever.  Respiratory: Positive for shortness of breath. Negative for wheezing.   Cardiovascular: Negative for chest pain, palpitations and leg swelling.  Gastrointestinal: Negative for vomiting and abdominal pain.  Genitourinary: Negative for dysuria.  Skin: Negative for rash.  Hematological: Negative for adenopathy.       Objective:   Physical Exam  Constitutional: He appears well-developed and well-nourished. No distress.  HENT:  Mouth/Throat: Oropharynx is clear and moist.  Neck: Neck supple.  Cardiovascular: Normal rate and regular rhythm.   Pulmonary/Chest: Breath sounds normal. No respiratory distress. He has no wheezes. He has no rales.  Abdominal: Soft. He exhibits no mass. There is no tenderness.  Musculoskeletal: He exhibits no edema.  Lymphadenopathy:    He has no cervical adenopathy.          Assessment & Plan:  Patient presents with chronic intermittent sweats and chills. He has never documented any fever. We explained those symptoms are very nonspecific and he has no risk factors whatsoever for malaria. Check further labs with TSH, CBC,, hepatic panel, sedimentation rate.  Check PPD. Symptoms are very nonspecific and doubt related to infection we have recommend he try to document fever during episodes

## 2015-09-01 ENCOUNTER — Telehealth: Payer: Self-pay | Admitting: Family Medicine

## 2015-09-01 ENCOUNTER — Other Ambulatory Visit: Payer: Self-pay

## 2015-09-01 LAB — TB SKIN TEST
INDURATION: 0 mm
TB SKIN TEST: NEGATIVE

## 2015-09-01 MED ORDER — LEVOTHYROXINE SODIUM 25 MCG PO TABS: 25.0000 ug | ORAL_TABLET | Freq: Every day | ORAL | Status: DC

## 2015-09-01 MED ORDER — HYDROCODONE-ACETAMINOPHEN 7.5-325 MG PO TABS: 1.0000 | ORAL_TABLET | Freq: Four times a day (QID) | ORAL | Status: DC | PRN

## 2015-09-01 NOTE — Telephone Encounter (Signed)
Refill once.  Avoid regular use. 

## 2015-09-01 NOTE — Telephone Encounter (Signed)
Pt is aware that Rx is ready for pickup  

## 2015-09-01 NOTE — Telephone Encounter (Signed)
Wyline Beady arrived with Douglas Mann Prescription bottle for his Hydrocodone stating he needs a refill.  Patient still uses [facility] on [address] [phone]

## 2015-09-01 NOTE — Addendum Note (Signed)
Addended by: Marcina Millard on: 09/01/2015 01:23 PM   Modules accepted: Orders

## 2015-09-01 NOTE — Telephone Encounter (Signed)
Last visit 08/29/15 Last refill 01/01/15 #60 0 refill

## 2015-10-09 ENCOUNTER — Encounter (HOSPITAL_COMMUNITY): Payer: Medicare Other | Admitting: Internal Medicine

## 2015-10-14 ENCOUNTER — Ambulatory Visit (HOSPITAL_COMMUNITY)
Admission: RE | Admit: 2015-10-14 | Discharge: 2015-10-14 | Disposition: A | Discharge status: Home / Self Care | Payer: Medicare Other | Source: Ambulatory Visit | Attending: Internal Medicine | Admitting: Internal Medicine

## 2015-10-14 VITALS — BP 152/80 | HR 77 | Wt 157.1 lb

## 2015-10-14 DIAGNOSIS — Z955 Presence of coronary angioplasty implant and graft: Secondary | ICD-10-CM | POA: Insufficient documentation

## 2015-10-14 DIAGNOSIS — F17200 Nicotine dependence, unspecified, uncomplicated: Secondary | ICD-10-CM | POA: Diagnosis not present

## 2015-10-14 DIAGNOSIS — Z7982 Long term (current) use of aspirin: Secondary | ICD-10-CM | POA: Insufficient documentation

## 2015-10-14 DIAGNOSIS — I252 Old myocardial infarction: Secondary | ICD-10-CM | POA: Diagnosis not present

## 2015-10-14 DIAGNOSIS — Z7902 Long term (current) use of antithrombotics/antiplatelets: Secondary | ICD-10-CM | POA: Diagnosis not present

## 2015-10-14 DIAGNOSIS — N183 Chronic kidney disease, stage 3 (moderate): Secondary | ICD-10-CM | POA: Diagnosis not present

## 2015-10-14 DIAGNOSIS — I5022 Chronic systolic (congestive) heart failure: Secondary | ICD-10-CM

## 2015-10-14 DIAGNOSIS — R3911 Hesitancy of micturition: Secondary | ICD-10-CM | POA: Insufficient documentation

## 2015-10-14 DIAGNOSIS — I2510 Atherosclerotic heart disease of native coronary artery without angina pectoris: Secondary | ICD-10-CM | POA: Diagnosis not present

## 2015-10-14 DIAGNOSIS — Z79899 Other long term (current) drug therapy: Secondary | ICD-10-CM | POA: Insufficient documentation

## 2015-10-14 DIAGNOSIS — I472 Ventricular tachycardia: Secondary | ICD-10-CM | POA: Insufficient documentation

## 2015-10-14 DIAGNOSIS — J449 Chronic obstructive pulmonary disease, unspecified: Secondary | ICD-10-CM | POA: Insufficient documentation

## 2015-10-14 DIAGNOSIS — I130 Hypertensive heart and chronic kidney disease with heart failure and stage 1 through stage 4 chronic kidney disease, or unspecified chronic kidney disease: Secondary | ICD-10-CM | POA: Diagnosis not present

## 2015-10-14 DIAGNOSIS — Z9981 Dependence on supplemental oxygen: Secondary | ICD-10-CM | POA: Insufficient documentation

## 2015-10-14 DIAGNOSIS — Z8249 Family history of ischemic heart disease and other diseases of the circulatory system: Secondary | ICD-10-CM | POA: Insufficient documentation

## 2015-10-14 LAB — COMPREHENSIVE METABOLIC PANEL
ALK PHOS: 98 U/L (ref 38–126)
ALT: 19 U/L (ref 17–63)
ANION GAP: 7 (ref 5–15)
AST: 22 U/L (ref 15–41)
Albumin: 3.6 g/dL (ref 3.5–5.0)
BUN: 8 mg/dL (ref 6–20)
CO2: 32 mmol/L (ref 22–32)
CREATININE: 1.28 mg/dL — AB (ref 0.61–1.24)
Calcium: 9.6 mg/dL (ref 8.9–10.3)
Chloride: 99 mmol/L — ABNORMAL LOW (ref 101–111)
GFR calc non Af Amer: 55 mL/min — ABNORMAL LOW (ref >60)
Glucose, Bld: 106 mg/dL — ABNORMAL HIGH (ref 65–99)
Potassium: 5.4 mmol/L — ABNORMAL HIGH (ref 3.5–5.1)
SODIUM: 138 mmol/L (ref 135–145)
Total Bilirubin: 0.6 mg/dL (ref 0.3–1.2)
Total Protein: 6.9 g/dL (ref 6.5–8.1)

## 2015-10-14 MED ORDER — TAMSULOSIN HCL 0.4 MG PO CAPS: 0.4000 mg | ORAL_CAPSULE | Freq: Every day | ORAL | Status: DC

## 2015-10-14 NOTE — Addendum Note (Signed)
Encounter addended by: Scarlette Calico, RN on: 10/14/2015  2:23 PM<BR>     Documentation filed: [employer], Patient Instructions Section, Orders

## 2015-10-14 NOTE — Patient Instructions (Signed)
Start Flomax 0.4 mg daily  Labs today  We will contact you in 6 months to schedule your next appointment.

## 2015-10-14 NOTE — Progress Notes (Signed)
ADVANCED HF CLINIC NOTE  Patient ID: Douglas Mann, male   DOB: 04-10-1946, 69 y.o.   MRN: [id number]  PCP: Dr. Elease Hashimoto CHF: Douglas Mann  HPI: Douglas Mann is a 69 year old with history of COPD oxygen dependent x 2 yrs, CAD s/p stent at [facility] >10 yrs ago and PCI 2015, HTN, prior GI bleed, ICM and chronic systolic HF.   Patient presented to the ED on 08/13/14 with CP and abdominal pain. He became tachypneic, hypoxic and diaphoretic and was intubated. NSTEMI noted with positive troponin and ST depression, and on 08/16/14 underwent LHC revealing severe 2 V CAD with 100% occluded RCA filled via collaterals and prox LAD 90% stenosis with pre/post stenosis ectasia. EF down to 5-10% and marked LVEDP 45. He underwent high risk PCI to LAD with IABP placement. Was reintubated after IABP removed and extubated again 9/1. Also required milrinone and IV lasix. Later Milrinone stopped. He was transitioned to lasix 40 mg twice a day. He was not placed on BB due to cardiogenic shock.  Had frequent NSVT and ranexa was added to amiodarone and LifeVest ordered. Weight on discharge was 165 pounds.   Follow up for Heart Failure:   Overall feeling pretty good. Breathing at baseline. Remains SOB with mild to moderate exertion. No orthopnea, PND or edema. Main issue is urinary hesitancy. Daughter follows him closely. Weight at home 155-157 pounds. He continues on 3 liters oxygen. CP or dizziness. Taking medications as prescribed. Has not needed lasix.  ECHO 11/2014 EF 45-50%   Labs (9/15): K 4.2 Creatinine 1.6 Labs (09/10/14): K 4.5 Creatinine 1.72  Labs 12/10/2014: K 4.8 Creatinine 1.33  Labs 01/27/15: K 4.4 Creatinine 1.39   ROS: All systems negative except as listed in HPI, PMH and Problem List.  SH: Active smoker, lives with daughter  FH: CAD  Past Medical History  Diagnosis Date  . HYPERLIPIDEMIA 10/01/2009  . HYPERTENSION 10/01/2009  . CAD 10/01/2009    Stent at [facility] greater than 10 years ago  . PVD 10/01/2009    . ALLERGIC RHINITIS 12/24/2009  . COPD 10/01/2009  . OSTEOARTHRITIS, GENERALIZED, MULTIPLE JOINTS 12/24/2009    Current Outpatient Prescriptions  Medication Sig Dispense Refill  . albuterol (VENTOLIN HFA) 108 (90 BASE) MCG/ACT inhaler Inhale 2 puffs into the lungs every 6 (six) hours as needed. 3 Inhaler 3  . amiodarone (PACERONE) 200 MG tablet Take 0.5 tablets (100 mg total) by mouth daily. 45 tablet 3  . aspirin 81 MG tablet Take 81 mg by mouth every other day.     Marland Kitchen atorvastatin (LIPITOR) 40 MG tablet Take 1 tablet (40 mg total) by mouth daily. 90 tablet 3  . carvedilol (COREG) 3.125 MG tablet Take 1 tablet (3.125 mg total) by mouth 2 (two) times daily. 60 tablet 3  . clopidogrel (PLAVIX) 75 MG tablet Take 1 tablet (75 mg total) by mouth daily. 30 tablet 6  . fish oil-omega-3 fatty acids 1000 MG capsule Take 1 g by mouth daily.     . Fluticasone-Salmeterol (ADVAIR) 500-50 MCG/DOSE AEPB Inhale 1 puff into the lungs 2 (two) times daily. 180 each 3  . furosemide (LASIX) 40 MG tablet Take 40 mg by mouth as needed.    Marland Kitchen HYDROcodone-acetaminophen (NORCO) 7.5-325 MG per tablet Take 1 tablet by mouth every 6 (six) hours as needed for moderate pain. 60 tablet 0  . ranolazine (RANEXA) 500 MG 12 hr tablet Take 1 tablet (500 mg total) by mouth 2 (two) times daily. 180 tablet 3  No current facility-administered medications for this encounter.    Filed Vitals:   10/14/15 1351  BP: 152/80  Pulse: 77  Weight: 157 lb 1.9 oz (71.269 kg)  SpO2: 100%    PHYSICAL EXAM: General:  No resp difficulty on 3 liters [place] oxygen. Sitting in wheelchair. Daughter present  HEENT: normal Neck: supple. JVP 5-6. Carotids 2+ bilaterally; no bruits. No lymphadenopathy or thryomegaly appreciated. Cor: PMI normal. Regular rate & rhythm. No rubs, gallops or murmurs.  Lungs: decreased air movement throughout on 3 liters [place]. Mild wheeze Abdomen: soft, nontender, nondistended. No hepatosplenomegaly. No bruits or masses.  Good bowel sounds. Extremities: no cyanosis, clubbing, rash, edema Neuro: alert & orientedx3, cranial nerves grossly intact. Moves all 4 extremities w/o difficulty. Affect pleasant.  ASSESSMENT & PLAN:  1) Chronic Systolic Heart Failure: ICM, 45-50%  (11/2014)  - NYHA II-III symptoms and volume status stable. Take lasix 20 mg if weight >= 158 lbs as needed.   - Continue coreg to 3.125 mg BID. Will not increase with mild wheezing on exam - On Entresto to 97-103 mg bid  - Potassium too high for spiro in past.  - Reinforced the need and importance of daily weights, a low sodium diet, and fluid restriction (less than 2 L a day). Instructed to call the HF clinic if weight increases more than 3 lbs overnight or 5 lbs in a week.   2) CAD: s/p recent NSTEMI with high risk PCI to LAD (08/16/14).  Has chronically occluded RCA.   - Continue ASA 81 and Plavix. - Continue statin.  - no s/s of ischemia 3)  NSVT: On amiodarone and Ranexa with NSVT in hospital. Continue amio 100 mg daily. Follow LFTs, TFTs. Yearly eye exams.  4)  H/o GI bleed: - no BRBPR. 5) COPD: on 3 liters [place] oxygen with rest and 4L with ambulation. Continue to maintain O2 sats >90%  6) CKD stage III - baseline creatinine base 1.6-1.8. Check BMET today.  7) HTN  - mildly elevated today but overall has been well controlled. Will follow. May see some improvement with Flomax.  8) Urinary hesitancy  - start flomax 0.4  F/U 6 months. BMET today.   Douglas Defino,MD

## 2015-10-15 ENCOUNTER — Telehealth: Payer: Self-pay | Admitting: Family Medicine

## 2015-10-15 NOTE — Telephone Encounter (Signed)
Patient Name: Douglas Mann  DOB: May 06, 1946    Initial Comment Caller states, woke up this morning with a cold sweat, wants an appt , He started a new diuretic Rx yesterday    Nurse Assessment  Nurse: Mallie Mussel, RN, Alveta Heimlich Date/Time Eilene Ghazi Time): 10/15/2015 11:18:01 AM  Confirm and document reason for call. If symptomatic, describe symptoms. ---Caller states that he needs to be seen. He began taking Tamsulosin yesterday. He woke up with a cold sweat this morning. This is not listed as a side effect of the medication per Drugs.com's site. He was prescribed this medication by his cardiologist. He has been having hot and cold flashes. He has had this for a couple years. Denies fever at present. https://www.drugs.com/sfx/tamsulosin-side-effects.html  Has the patient traveled out of the country within the last 30 days? ---No  Does the patient have any new or worsening symptoms? ---Yes  Will a triage be completed? ---Yes  Related visit to physician within the last 2 weeks? ---No  Does the PT have any chronic conditions? (i.e. diabetes, asthma, etc.) ---Yes  List chronic conditions. ---MI, BPH     Guidelines    Guideline Title Affirmed Question Affirmed Notes  Sweating [1] MODERATE sweating (e.g., interferes with normal activities like work or school) AND [2] possibly related to new medication or change in medication dosage    Final Disposition User   See PCP When Office is Open (within 3 days) Mallie Mussel, RN, Alveta Heimlich    Comments  Appointment made for tomorrow at 11:45am with Dr. Carolann Littler.   Disagree/Comply: Comply

## 2015-10-16 ENCOUNTER — Ambulatory Visit (INDEPENDENT_AMBULATORY_CARE_PROVIDER_SITE_OTHER): Payer: Medicare Other | Admitting: Family Medicine

## 2015-10-16 DIAGNOSIS — E039 Hypothyroidism, unspecified: Secondary | ICD-10-CM | POA: Diagnosis not present

## 2015-10-16 DIAGNOSIS — K5900 Constipation, unspecified: Secondary | ICD-10-CM

## 2015-10-16 DIAGNOSIS — I10 Essential (primary) hypertension: Secondary | ICD-10-CM

## 2015-10-16 MED ORDER — HYDROCORTISONE 2.5 % RE CREA: 1.0000 "application " | TOPICAL_CREAM | Freq: Two times a day (BID) | RECTAL | Status: DC | PRN

## 2015-10-16 NOTE — Patient Instructions (Signed)
Constipation, Adult Constipation is when a person has fewer than three bowel movements a week, has difficulty having a bowel movement, or has stools that are dry, hard, or larger than normal. As people grow older, constipation is more common. A low-fiber diet, not taking in enough fluids, and taking certain medicines may make constipation worse.  CAUSES   Certain medicines, such as antidepressants, pain medicine, iron supplements, antacids, and water pills.   Certain diseases, such as diabetes, irritable bowel syndrome (IBS), thyroid disease, or depression.   Not drinking enough water.   Not eating enough fiber-rich foods.   Stress or travel.   Lack of physical activity or exercise.   Ignoring the urge to have a bowel movement.   Using laxatives too much.  SIGNS AND SYMPTOMS   Having fewer than three bowel movements a week.   Straining to have a bowel movement.   Having stools that are hard, dry, or larger than normal.   Feeling full or bloated.   Pain in the lower abdomen.   Not feeling relief after having a bowel movement.  DIAGNOSIS  Your health care provider will take a medical history and perform a physical exam. Further testing may be done for severe constipation. Some tests may include:  A barium enema X-ray to examine your rectum, colon, and, sometimes, your small intestine.   A sigmoidoscopy to examine your lower colon.   A colonoscopy to examine your entire colon. TREATMENT  Treatment will depend on the severity of your constipation and what is causing it. Some dietary treatments include drinking more fluids and eating more fiber-rich foods. Lifestyle treatments may include regular exercise. If these diet and lifestyle recommendations do not help, your health care provider may recommend taking over-the-counter laxative medicines to help you have bowel movements. Prescription medicines may be prescribed if over-the-counter medicines do not work.   HOME CARE INSTRUCTIONS   Eat foods that have a lot of fiber, such as fruits, vegetables, whole grains, and beans.  Limit foods high in fat and processed sugars, such as french fries, hamburgers, cookies, candies, and soda.   A fiber supplement may be added to your diet if you cannot get enough fiber from foods.   Drink enough fluids to keep your urine clear or pale yellow.   Exercise regularly or as directed by your health care provider.   Go to the restroom when you have the urge to go. Do not hold it.   Only take over-the-counter or prescription medicines as directed by your health care provider. Do not take other medicines for constipation without talking to your health care provider first.  Anna IF:   You have bright red blood in your stool.   Your constipation lasts for more than 4 days or gets worse.   You have abdominal or rectal pain.   You have thin, pencil-like stools.   You have unexplained weight loss. MAKE SURE YOU:   Understand these instructions.  Will watch your condition.  Will get help right away if you are not doing well or get worse.   This information is not intended to replace advice given to you by your health care provider. Make sure you discuss any questions you have with your health care provider.   Document Released: 09/03/2004 Document Revised: 12/27/2014 Document Reviewed: 09/17/2013 Elsevier Interactive Patient Education 2016 [place] as needed for constipation.

## 2015-10-16 NOTE — Progress Notes (Signed)
   Subjective:    Patient ID: Douglas Mann, male    DOB: 1946/02/02, 69 y.o.   MRN: [id number]  HPI Patient has multiple chronic problems including history of COPD, hypertension, hypothyroidism, hyperlipidemia, chronic kidney disease, CAD, chronic systolic heart failure, BPH, hypothyroidism  Recently presented with "chills ". No fever. No evidence for infection. TSH over 10. We initiated levothyroxin 25 g once daily and patient only took this for one month and discontinued because he states this "did not make him feel any better ". He denies any side effects.  Recent issues with constipation. Sometimes goes 3 or 4 days without bowel movement. Very immobile related to chronic lung disease. He specifically had questions about medications such as Linzess or Amitiza- which she had seen never to eyes on television. We explained these medications were mostly for people with irritable bowel syndrome.  Only drinks about 3 glasses of fluid per day  COPD stable. Nasal oxygen. Recent issues with BPH. Cardiologist initiated Flomax and this has helped his urinary symptoms.  Past Medical History  Diagnosis Date  . HYPERLIPIDEMIA 10/01/2009  . HYPERTENSION 10/01/2009  . CAD 10/01/2009    Stent at [facility] greater than 10 years ago  . PVD 10/01/2009  . ALLERGIC RHINITIS 12/24/2009  . COPD 10/01/2009  . OSTEOARTHRITIS, GENERALIZED, MULTIPLE JOINTS 12/24/2009   Past Surgical History  Procedure Laterality Date  . Fracture surgery  2012    ORIF r tibia fracture  . Tonsillectomy and adenoidectomy    . Left heart cath Bilateral 08/15/2014    Procedure: LEFT HEART CATH;  Surgeon: Leonie Man, MD;  Location: [facility] CATH LAB;  Service: Cardiovascular;  Laterality: Bilateral;  . Percutaneous coronary stent intervention (pci-s) N/A 08/16/2014    Procedure: PERCUTANEOUS CORONARY STENT INTERVENTION (PCI-S);  Surgeon: Leonie Man, MD;  Location: [facility] CATH LAB;  Service: Cardiovascular;  Laterality: N/A;  . Cardiac  catheterization  08/16/2014    Procedure: IABP INSERTION;  Surgeon: Leonie Man, MD;  Location: [facility] CATH LAB;  Service: Cardiovascular;;    reports that he has quit smoking. His smoking use included Cigarettes. He has a 75 pack-year smoking history. He has never used smokeless tobacco. He reports that he does not drink alcohol or use illicit drugs. family history includes CAD in his father. No Known Allergies    Review of Systems  Constitutional: Positive for chills. Negative for fever and appetite change.  Respiratory: Positive for shortness of breath. Negative for cough.   Cardiovascular: Negative for chest pain, palpitations and leg swelling.  Gastrointestinal: Positive for constipation. Negative for nausea, vomiting, abdominal pain, diarrhea and abdominal distention.       Objective:   Physical Exam  Constitutional: He appears well-developed and well-nourished.  Neck: Neck supple. No JVD present.  Cardiovascular: Normal rate and regular rhythm.   Pulmonary/Chest: He has no rales.  Somewhat diminished breath sounds throughout.  Abdominal: Soft. There is no tenderness.  Musculoskeletal: He exhibits no edema.  Neurological: He is alert.          Assessment & Plan:  #1 hypothyroidism. Patient currently not on medication. We stressed importance of compliance and that we did not expect him to notice symptomatic improvement immediately if at all. Start back low-dose levothyroxine 25 g daily and recheck TSH in 3 months #2 constipation. Could be partly related to #1 but mostly related to immobility. Discussed general measures for constipation. Recommend trial of MiraLAX as necessary #3 hypertension stable and at goal

## 2015-10-29 ENCOUNTER — Ambulatory Visit (HOSPITAL_COMMUNITY)
Admission: RE | Admit: 2015-10-29 | Discharge: 2015-10-29 | Disposition: A | Discharge status: Home / Self Care | Payer: Medicare Other | Source: Ambulatory Visit | Attending: Internal Medicine | Admitting: Internal Medicine

## 2015-10-29 DIAGNOSIS — I5022 Chronic systolic (congestive) heart failure: Secondary | ICD-10-CM | POA: Diagnosis present

## 2015-10-29 LAB — BASIC METABOLIC PANEL
Anion gap: 7 (ref 5–15)
BUN: 10 mg/dL (ref 6–20)
CALCIUM: 9.4 mg/dL (ref 8.9–10.3)
CO2: 29 mmol/L (ref 22–32)
CREATININE: 1.24 mg/dL (ref 0.61–1.24)
Chloride: 101 mmol/L (ref 101–111)
GFR calc Af Amer: >60 mL/min (ref >60)
GFR, EST NON AFRICAN AMERICAN: 57 mL/min — AB (ref >60)
GLUCOSE: 107 mg/dL — AB (ref 65–99)
Potassium: 5.5 mmol/L — ABNORMAL HIGH (ref 3.5–5.1)
Sodium: 137 mmol/L (ref 135–145)

## 2015-12-12 ENCOUNTER — Other Ambulatory Visit (HOSPITAL_COMMUNITY): Payer: Self-pay | Admitting: *Deleted

## 2015-12-12 MED ORDER — CLOPIDOGREL BISULFATE 75 MG PO TABS
75.0000 mg | ORAL_TABLET | Freq: Every day | ORAL | Status: DC
Start: 2015-12-12 — End: 2016-01-06

## 2015-12-19 ENCOUNTER — Other Ambulatory Visit: Payer: Self-pay

## 2015-12-19 ENCOUNTER — Other Ambulatory Visit (HOSPITAL_COMMUNITY): Payer: Self-pay | Admitting: Cardiology

## 2015-12-19 DIAGNOSIS — I5022 Chronic systolic (congestive) heart failure: Secondary | ICD-10-CM

## 2015-12-19 MED ORDER — CARVEDILOL 3.125 MG PO TABS: 3.1250 mg | ORAL_TABLET | Freq: Two times a day (BID) | ORAL | Status: DC

## 2015-12-19 MED ORDER — TAMSULOSIN HCL 0.4 MG PO CAPS: 0.4000 mg | ORAL_CAPSULE | Freq: Every day | ORAL | Status: DC

## 2015-12-19 MED ORDER — ATORVASTATIN CALCIUM 40 MG PO TABS: 40.0000 mg | ORAL_TABLET | Freq: Every day | ORAL | Status: DC

## 2015-12-19 MED ORDER — FLUTICASONE-SALMETEROL 500-50 MCG/DOSE IN AEPB: 1.0000 | INHALATION_SPRAY | Freq: Two times a day (BID) | RESPIRATORY_TRACT | Status: DC

## 2015-12-19 MED ORDER — AMIODARONE HCL 200 MG PO TABS: 100.0000 mg | ORAL_TABLET | Freq: Every day | ORAL | Status: DC

## 2015-12-23 ENCOUNTER — Other Ambulatory Visit: Payer: Self-pay

## 2015-12-23 DIAGNOSIS — I5022 Chronic systolic (congestive) heart failure: Secondary | ICD-10-CM

## 2016-01-06 ENCOUNTER — Telehealth (HOSPITAL_COMMUNITY): Payer: Self-pay | Admitting: *Deleted

## 2016-01-06 ENCOUNTER — Other Ambulatory Visit (HOSPITAL_COMMUNITY): Payer: Self-pay | Admitting: *Deleted

## 2016-01-06 DIAGNOSIS — I5022 Chronic systolic (congestive) heart failure: Secondary | ICD-10-CM

## 2016-01-06 MED ORDER — CLOPIDOGREL BISULFATE 75 MG PO TABS: 75.0000 mg | ORAL_TABLET | Freq: Every day | ORAL | Status: DC

## 2016-01-06 MED ORDER — CARVEDILOL 3.125 MG PO TABS: 3.1250 mg | ORAL_TABLET | Freq: Two times a day (BID) | ORAL | Status: DC

## 2016-01-06 MED ORDER — AMIODARONE HCL 200 MG PO TABS: 100.0000 mg | ORAL_TABLET | Freq: Every day | ORAL | Status: DC

## 2016-01-06 MED ORDER — ATORVASTATIN CALCIUM 40 MG PO TABS: 40.0000 mg | ORAL_TABLET | Freq: Every day | ORAL | Status: DC

## 2016-01-06 MED ORDER — FUROSEMIDE 40 MG PO TABS: 40.0000 mg | ORAL_TABLET | ORAL | Status: DC | PRN

## 2016-01-06 MED ORDER — RANOLAZINE ER 500 MG PO TB12: 500.0000 mg | ORAL_TABLET | Freq: Two times a day (BID) | ORAL | Status: DC

## 2016-01-06 NOTE — Telephone Encounter (Signed)
Refill sent for 90 day supply per pt request

## 2016-01-07 DIAGNOSIS — J449 Chronic obstructive pulmonary disease, unspecified: Secondary | ICD-10-CM | POA: Diagnosis not present

## 2016-01-08 ENCOUNTER — Other Ambulatory Visit (HOSPITAL_COMMUNITY): Payer: Self-pay | Admitting: *Deleted

## 2016-01-16 ENCOUNTER — Telehealth (HOSPITAL_COMMUNITY): Payer: Self-pay | Admitting: Cardiology

## 2016-01-16 MED ORDER — LEVOTHYROXINE SODIUM 25 MCG PO TABS: 25.0000 ug | ORAL_TABLET | ORAL | Status: DC

## 2016-01-16 MED ORDER — TAMSULOSIN HCL 0.4 MG PO CAPS: 0.4000 mg | ORAL_CAPSULE | Freq: Every day | ORAL | Status: DC

## 2016-01-16 NOTE — Telephone Encounter (Signed)
Will refill meds as a courtesy however further refills should come from PCP

## 2016-01-21 ENCOUNTER — Other Ambulatory Visit (INDEPENDENT_AMBULATORY_CARE_PROVIDER_SITE_OTHER): Payer: Commercial Managed Care - HMO

## 2016-01-21 ENCOUNTER — Other Ambulatory Visit (HOSPITAL_COMMUNITY): Payer: Self-pay | Admitting: *Deleted

## 2016-01-21 DIAGNOSIS — E039 Hypothyroidism, unspecified: Secondary | ICD-10-CM | POA: Diagnosis not present

## 2016-01-21 DIAGNOSIS — I5022 Chronic systolic (congestive) heart failure: Secondary | ICD-10-CM

## 2016-01-21 LAB — TSH: TSH: 3.9 u[IU]/mL (ref 0.35–4.50)

## 2016-01-21 MED ORDER — CARVEDILOL 3.125 MG PO TABS: 3.1250 mg | ORAL_TABLET | Freq: Two times a day (BID) | ORAL | Status: DC

## 2016-01-21 MED ORDER — TAMSULOSIN HCL 0.4 MG PO CAPS: 0.4000 mg | ORAL_CAPSULE | Freq: Every day | ORAL | Status: DC

## 2016-01-22 ENCOUNTER — Other Ambulatory Visit (HOSPITAL_COMMUNITY): Payer: Self-pay | Admitting: *Deleted

## 2016-01-22 ENCOUNTER — Telehealth (HOSPITAL_COMMUNITY): Payer: Self-pay

## 2016-01-22 MED ORDER — CLOPIDOGREL BISULFATE 75 MG PO TABS: 75.0000 mg | ORAL_TABLET | Freq: Every day | ORAL | Status: DC

## 2016-01-22 NOTE — Telephone Encounter (Signed)
Plavix RX refill for 90 days with 3 refill to [facility]

## 2016-01-24 DIAGNOSIS — J449 Chronic obstructive pulmonary disease, unspecified: Secondary | ICD-10-CM | POA: Diagnosis not present

## 2016-02-02 ENCOUNTER — Other Ambulatory Visit (HOSPITAL_COMMUNITY): Payer: Self-pay

## 2016-02-07 DIAGNOSIS — J449 Chronic obstructive pulmonary disease, unspecified: Secondary | ICD-10-CM | POA: Diagnosis not present

## 2016-02-16 ENCOUNTER — Other Ambulatory Visit: Payer: Self-pay | Admitting: Family Medicine

## 2016-02-16 MED ORDER — LEVOTHYROXINE SODIUM 25 MCG PO TABS: 25.0000 ug | ORAL_TABLET | ORAL | Status: DC

## 2016-02-16 NOTE — Telephone Encounter (Signed)
Rx sent 

## 2016-02-16 NOTE — Telephone Encounter (Signed)
Pt request refill of the following: levothyroxine (SYNTHROID, LEVOTHROID) 25 MCG tablet ,   90 day supply   Phamacy:  [facility]

## 2016-02-21 DIAGNOSIS — J449 Chronic obstructive pulmonary disease, unspecified: Secondary | ICD-10-CM | POA: Diagnosis not present

## 2016-03-02 ENCOUNTER — Encounter: Payer: Self-pay | Admitting: Family Medicine

## 2016-03-02 ENCOUNTER — Ambulatory Visit (INDEPENDENT_AMBULATORY_CARE_PROVIDER_SITE_OTHER): Payer: Commercial Managed Care - HMO | Admitting: Family Medicine

## 2016-03-02 VITALS — BP 120/78 | HR 94 | Temp 97.8°F | Ht 67.0 in | Wt 156.0 lb

## 2016-03-02 DIAGNOSIS — E039 Hypothyroidism, unspecified: Secondary | ICD-10-CM | POA: Diagnosis not present

## 2016-03-02 DIAGNOSIS — M159 Polyosteoarthritis, unspecified: Secondary | ICD-10-CM

## 2016-03-02 DIAGNOSIS — J449 Chronic obstructive pulmonary disease, unspecified: Secondary | ICD-10-CM

## 2016-03-02 DIAGNOSIS — I10 Essential (primary) hypertension: Secondary | ICD-10-CM

## 2016-03-02 DIAGNOSIS — N400 Enlarged prostate without lower urinary tract symptoms: Secondary | ICD-10-CM | POA: Insufficient documentation

## 2016-03-02 NOTE — Progress Notes (Signed)
Subjective:    Patient ID: Douglas Mann, male    DOB: 1946/09/20, 70 y.o.   MRN: [id number]  HPI Patient here for medical follow-up. Was initially scheduled for CPE but he declines.  Multiple chronic problems including severe COPD on chronic home oxygen, chronic kidney disease, systolic heart failure, history of CAD, hypothyroidism, hyperlipidemia, hypertension, osteoarthritis, BPH.   He complains of dry nasal passages. He does not use any nasal saline. He is using his oxygen continuously. Respiratory status is stable. He has some chronic dyspnea with activity which is unchanged. Denies any recent chest pains.   Medications reviewed. Compliant with all. Remains on Advair for COPD.  Denies any orthopnea. No recent increased peripheral edema.  Remains on Ranexa. No recent anginal symptoms.   Also complains of frequently "feeling cold ". He had repeat TSH in February which was normal range. Appetite is stable.  Past Medical History  Diagnosis Date  . HYPERLIPIDEMIA 10/01/2009  . HYPERTENSION 10/01/2009  . CAD 10/01/2009    Stent at [place] greater than 10 years ago  . PVD 10/01/2009  . ALLERGIC RHINITIS 12/24/2009  . COPD 10/01/2009  . OSTEOARTHRITIS, GENERALIZED, MULTIPLE JOINTS 12/24/2009   Past Surgical History  Procedure Laterality Date  . Fracture surgery  2012    ORIF r tibia fracture  . Tonsillectomy and adenoidectomy    . Left heart cath Bilateral 08/15/2014    Procedure: LEFT HEART CATH;  Surgeon: Leonie Man, MD;  Location: [facility] CATH LAB;  Service: Cardiovascular;  Laterality: Bilateral;  . Percutaneous coronary stent intervention (pci-s) N/A 08/16/2014    Procedure: PERCUTANEOUS CORONARY STENT INTERVENTION (PCI-S);  Surgeon: Leonie Man, MD;  Location: [facility] CATH LAB;  Service: Cardiovascular;  Laterality: N/A;  . Cardiac catheterization  08/16/2014    Procedure: IABP INSERTION;  Surgeon: Leonie Man, MD;  Location: [facility] CATH LAB;  Service: Cardiovascular;;    reports that  he has quit smoking. His smoking use included Cigarettes. He has a 75 pack-year smoking history. He has never used smokeless tobacco. He reports that he does not drink alcohol or use illicit drugs. family history includes CAD in his father. No Known Allergies     Review of Systems  Constitutional: Positive for chills and fatigue. Negative for fever.  Respiratory: Positive for shortness of breath. Negative for cough.   Cardiovascular: Negative for chest pain, palpitations and leg swelling.  Gastrointestinal: Negative for abdominal pain.  Genitourinary: Negative for dysuria.  Neurological: Negative for dizziness and syncope.  Psychiatric/Behavioral: Negative for confusion.       Objective:   Physical Exam  Constitutional: He appears well-developed and well-nourished. No distress.  HENT:  Mouth/Throat: Oropharynx is clear and moist.  Neck: Neck supple.  Cardiovascular: Normal rate and regular rhythm.   Pulmonary/Chest: Effort normal.  has some faint diffuse wheezes. Diminished breath sounds throughout.  Musculoskeletal: He exhibits no edema.  Neurological: He is alert.          Assessment & Plan:   #1 severe end-stage COPD. We recommended Prevnar 13 and he declines. Continue home oxygen. Continue Advair. We discussed long-acting anticholinergics such as Incruse but he is not interested at this point and would also have to use with caution because of his history of BPH.   #2 dyslipidemia. Patient is overdue for lipids but he declines today. He does agree to get follow-up in 3 months fasting   #3 history of CAD. No recent anginal symptoms. He is encouraged to keep close follow-up with  cardiology   #4 hypothyroidism. Recent TSH at goal

## 2016-03-02 NOTE — Patient Instructions (Signed)
Use over the counter saline nose spray and use 2 sprays each nostril several times per day.

## 2016-03-02 NOTE — Progress Notes (Signed)
Pre visit review using our clinic review tool, if applicable. No additional management support is needed unless otherwise documented below in the visit note. 

## 2016-03-06 DIAGNOSIS — J449 Chronic obstructive pulmonary disease, unspecified: Secondary | ICD-10-CM | POA: Diagnosis not present

## 2016-03-23 ENCOUNTER — Other Ambulatory Visit: Payer: Self-pay | Admitting: Family Medicine

## 2016-03-23 DIAGNOSIS — J449 Chronic obstructive pulmonary disease, unspecified: Secondary | ICD-10-CM | POA: Diagnosis not present

## 2016-04-06 DIAGNOSIS — J449 Chronic obstructive pulmonary disease, unspecified: Secondary | ICD-10-CM | POA: Diagnosis not present

## 2016-04-19 ENCOUNTER — Telehealth: Payer: Self-pay | Admitting: *Deleted

## 2016-04-19 MED ORDER — TAMSULOSIN HCL 0.4 MG PO CAPS: 0.4000 mg | ORAL_CAPSULE | Freq: Every day | ORAL | Status: DC

## 2016-04-19 NOTE — Telephone Encounter (Signed)
RX refilled for patient

## 2016-04-19 NOTE — Telephone Encounter (Signed)
[facility] pharmacy Phone [phone] Fax [phone] Refill request tamsulosin (FLOMAX) 0.4 MG CAPS capsule patient requesting a 90 day supply

## 2016-04-22 DIAGNOSIS — J449 Chronic obstructive pulmonary disease, unspecified: Secondary | ICD-10-CM | POA: Diagnosis not present

## 2016-05-03 ENCOUNTER — Other Ambulatory Visit: Payer: Self-pay

## 2016-05-03 MED ORDER — ALBUTEROL SULFATE HFA 108 (90 BASE) MCG/ACT IN AERS: 2.0000 | INHALATION_SPRAY | Freq: Four times a day (QID) | RESPIRATORY_TRACT | Status: DC | PRN

## 2016-05-06 DIAGNOSIS — J449 Chronic obstructive pulmonary disease, unspecified: Secondary | ICD-10-CM | POA: Diagnosis not present

## 2016-05-08 IMAGING — CT CT ABD-PELV W/ CM
1 of 2 series · 15 of 32 positions shown, 19 images · IV contrast (OMNIPAQUE 300)
Comparison: None.

CLINICAL DATA: GI bleeding.

EXAM:
CT ABDOMEN AND PELVIS WITH CONTRAST
TECHNIQUE: Multidetector CT imaging of the abdomen and pelvis was performed
using the standard protocol following bolus administration of
intravenous contrast.
CONTRAST:  50mL OMNIPAQUE IOHEXOL 300 MG/ML SOLN, 100mL OMNIPAQUE
IOHEXOL 300 MG/ML SOLN

[Series 2: abd/pel with · axial · 0.87mm/px · z∈[+1294,+1660]mm · 15 of 81 slices shown, 19 images]
[im 4/81  soft-tissue]
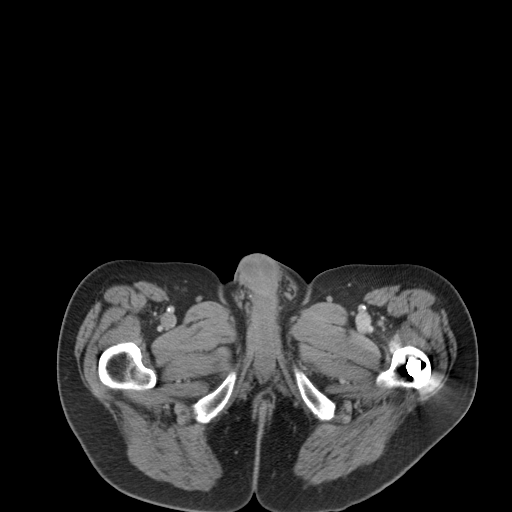
[im 4/81  bone]
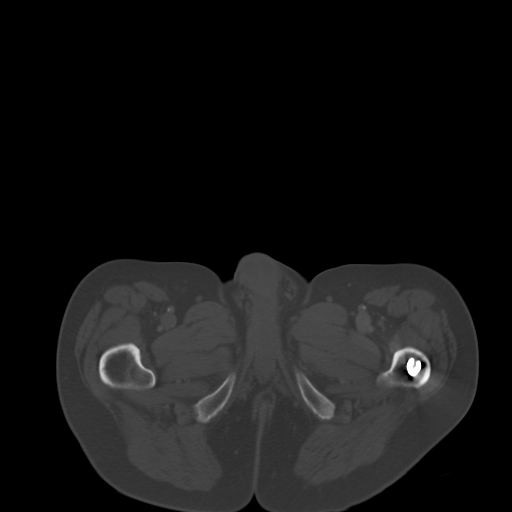
[im 10/81  soft-tissue]
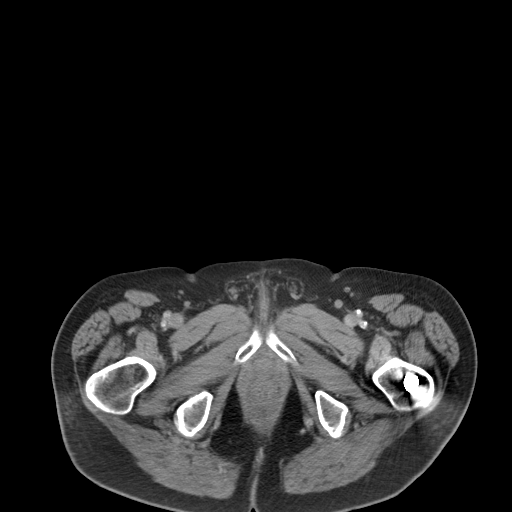
[im 17/81  soft-tissue]
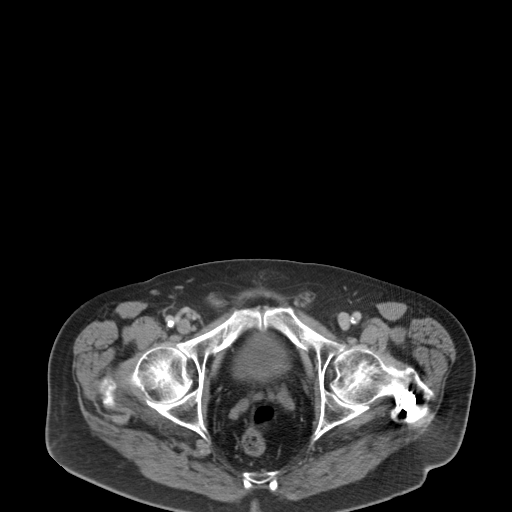
[im 23/81  soft-tissue]
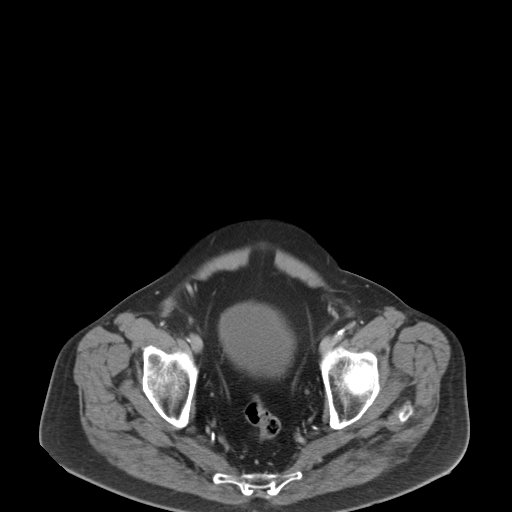
[im 29/81  soft-tissue]
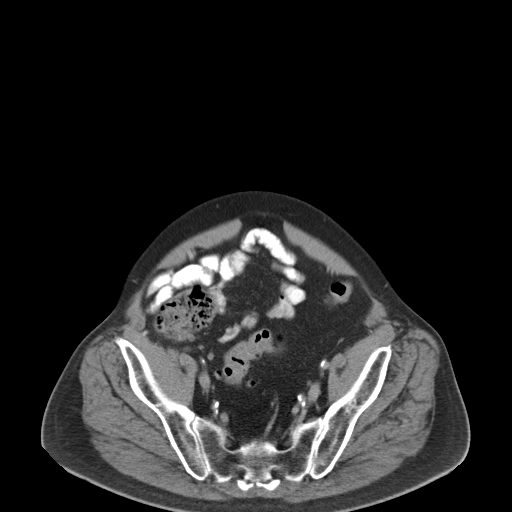
[im 36/81  soft-tissue]
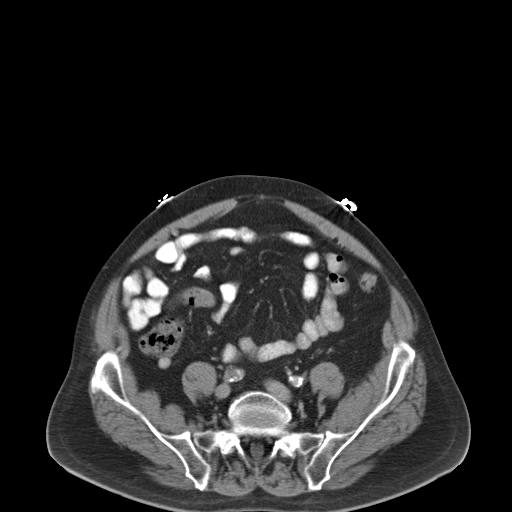
[im 42/81  soft-tissue]
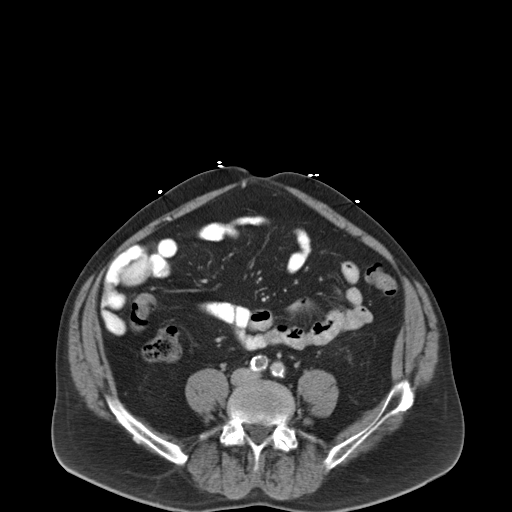
[im 45/81  soft-tissue]
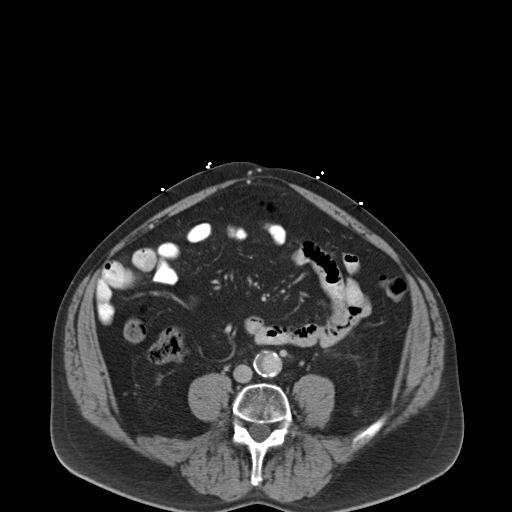
[im 52/81  soft-tissue]
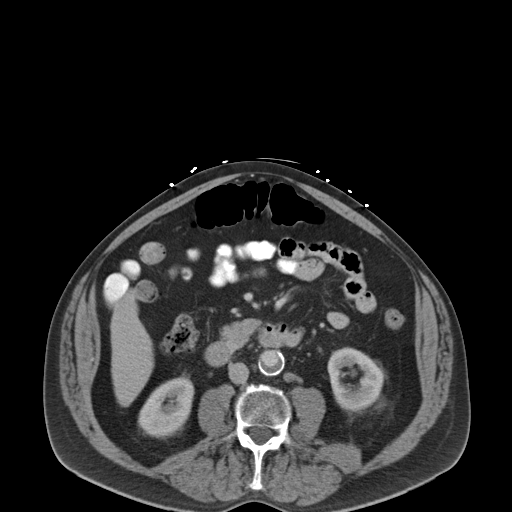
[im 52/81  bone]
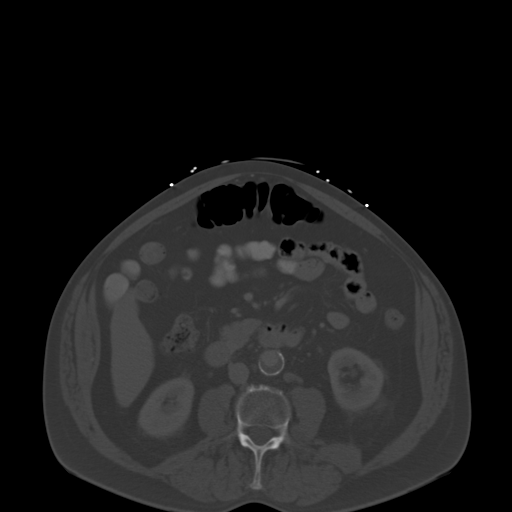
[im 58/81  soft-tissue]
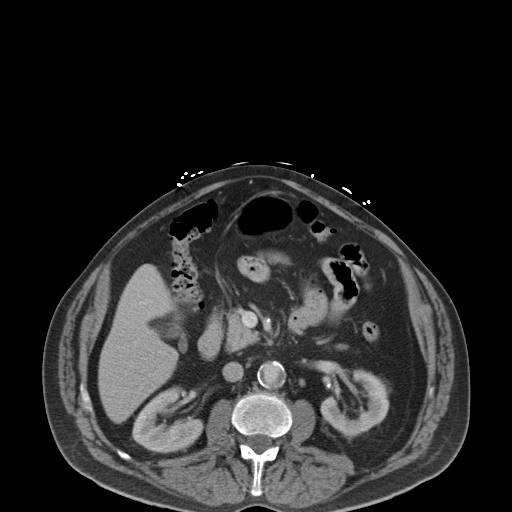
[im 65/81  soft-tissue]
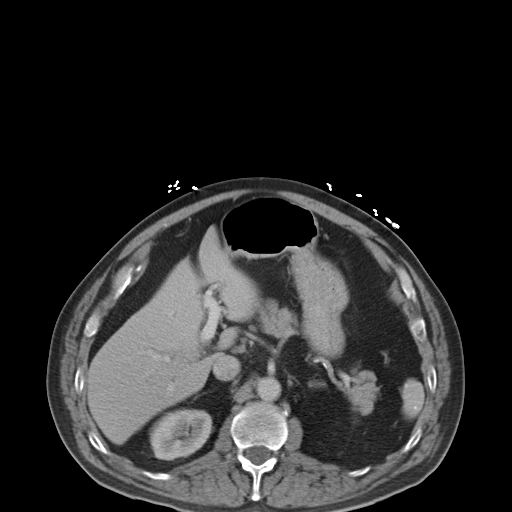
[im 68/81  lung]
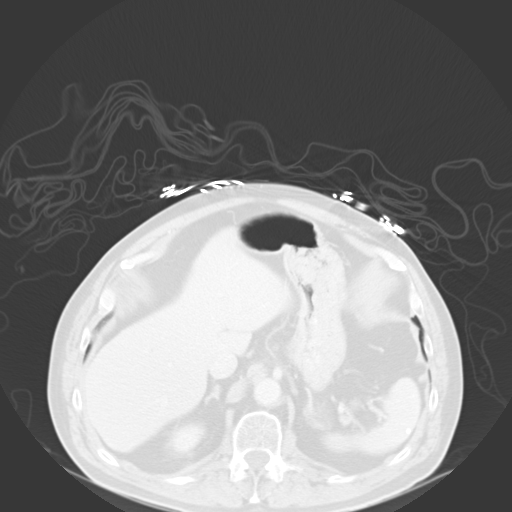
[im 71/81  soft-tissue]
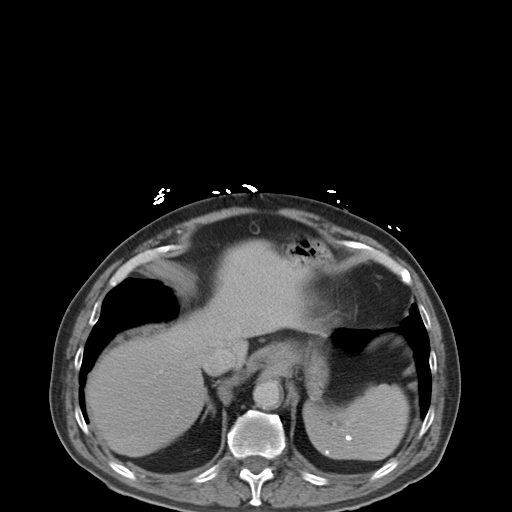
[im 71/81  lung]
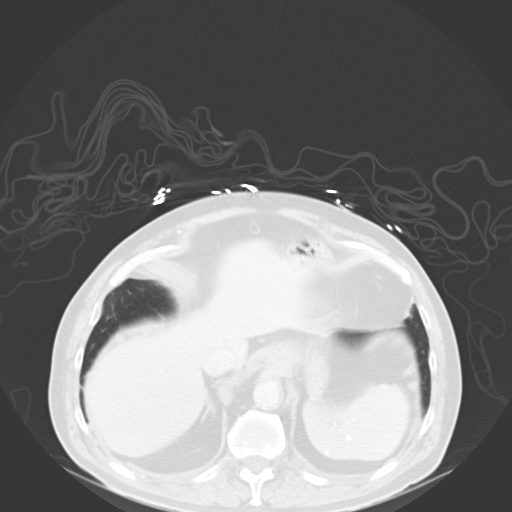
[im 74/81  lung]
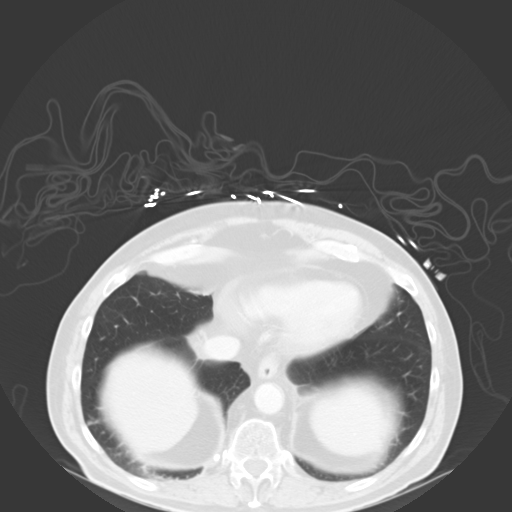
[im 77/81  soft-tissue]
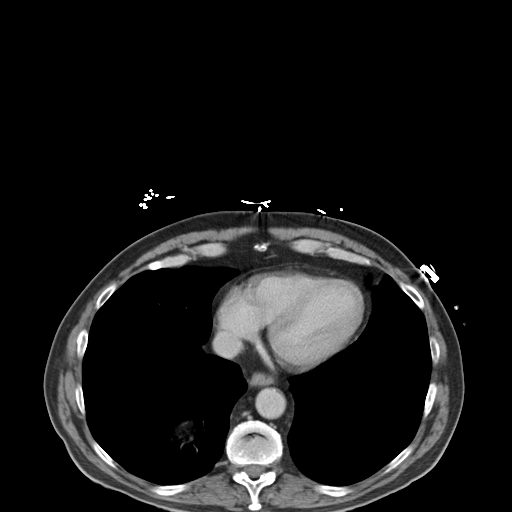
[im 77/81  lung]
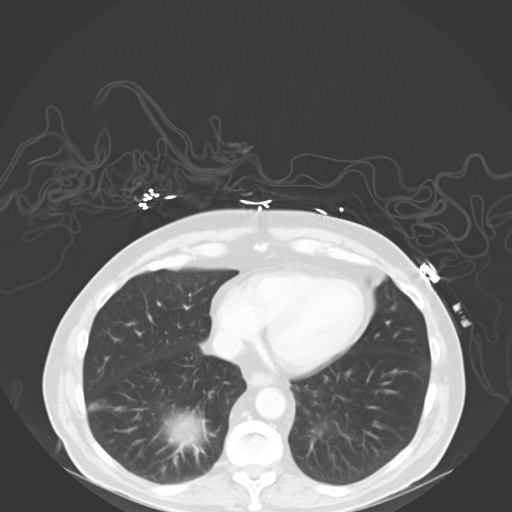

[15 of 32 positions shown; findings below may reference images not displayed]

FINDINGS: The lung bases demonstrate a focal calcification over the post to
medial right duct matter border. There is minimal linear scarring
over the right base.

Abdominal images demonstrate several calcified splenic granulomas.
There is mild cholelithiasis. The liver, pancreas and adrenal glands
are within normal. Kidneys normal in size without hydronephrosis or
focal mass. There are a couple small calcifications adjacent the
left renal hilum likely vascular. Ureters are normal. The appendix
is normal. There is diverticulosis throughout the colon including
the cecum. There is calcified plaque involving the abdominal aorta
and iliac vessels. There is minimal dilatation of the infrarenal
abdominal aorta measuring 2.7 cm in AP diameter.

Pelvic images demonstrate the bladder, prostate and rectum to be
within normal. There is no free fluid or inflammatory change noted.
Hardware is present over the left proximal femur. There are mild
degenerative changes of the spine and hips.
IMPRESSION: No acute findings in the abdomen/pelvis.

Moderate diverticulosis throughout the colon without active
inflammation.

Mild cholelithiasis.

## 2016-05-18 ENCOUNTER — Other Ambulatory Visit: Payer: Self-pay | Admitting: *Deleted

## 2016-05-18 MED ORDER — ALBUTEROL SULFATE HFA 108 (90 BASE) MCG/ACT IN AERS: 2.0000 | INHALATION_SPRAY | Freq: Four times a day (QID) | RESPIRATORY_TRACT | Status: DC | PRN

## 2016-05-18 NOTE — Telephone Encounter (Signed)
Rx done. 

## 2016-05-23 DIAGNOSIS — J449 Chronic obstructive pulmonary disease, unspecified: Secondary | ICD-10-CM | POA: Diagnosis not present

## 2016-05-31 ENCOUNTER — Ambulatory Visit: Payer: PRIVATE HEALTH INSURANCE | Admitting: Family Medicine

## 2016-05-31 ENCOUNTER — Encounter: Payer: Self-pay | Admitting: Family Medicine

## 2016-05-31 ENCOUNTER — Ambulatory Visit (INDEPENDENT_AMBULATORY_CARE_PROVIDER_SITE_OTHER): Payer: Commercial Managed Care - HMO | Admitting: Family Medicine

## 2016-05-31 VITALS — BP 140/70 | HR 79 | Temp 97.9°F | Ht 67.0 in | Wt 151.0 lb

## 2016-05-31 DIAGNOSIS — E785 Hyperlipidemia, unspecified: Secondary | ICD-10-CM

## 2016-05-31 DIAGNOSIS — J449 Chronic obstructive pulmonary disease, unspecified: Secondary | ICD-10-CM | POA: Diagnosis not present

## 2016-05-31 DIAGNOSIS — N400 Enlarged prostate without lower urinary tract symptoms: Secondary | ICD-10-CM | POA: Diagnosis not present

## 2016-05-31 DIAGNOSIS — I10 Essential (primary) hypertension: Secondary | ICD-10-CM | POA: Diagnosis not present

## 2016-05-31 DIAGNOSIS — M159 Polyosteoarthritis, unspecified: Secondary | ICD-10-CM

## 2016-05-31 LAB — LIPID PANEL
Cholesterol: 167 mg/dL (ref 0–200)
HDL: 51.5 mg/dL (ref >39.00)
LDL Cholesterol: 99 mg/dL (ref 0–99)
NonHDL: 115.27
TRIGLYCERIDES: 82 mg/dL (ref 0.0–149.0)
Total CHOL/HDL Ratio: 3
VLDL: 16.4 mg/dL (ref 0.0–40.0)

## 2016-05-31 LAB — HEPATIC FUNCTION PANEL
ALBUMIN: 4.2 g/dL (ref 3.5–5.2)
ALK PHOS: 91 U/L (ref 39–117)
ALT: 14 U/L (ref 0–53)
AST: 14 U/L (ref 0–37)
Bilirubin, Direct: 0.2 mg/dL (ref 0.0–0.3)
TOTAL PROTEIN: 7.4 g/dL (ref 6.0–8.3)
Total Bilirubin: 0.7 mg/dL (ref 0.2–1.2)

## 2016-05-31 MED ORDER — FLUTICASONE-SALMETEROL 500-50 MCG/DOSE IN AEPB: 1.0000 | INHALATION_SPRAY | Freq: Two times a day (BID) | RESPIRATORY_TRACT | Status: DC

## 2016-05-31 MED ORDER — HYDROCODONE-ACETAMINOPHEN 7.5-325 MG PO TABS: 1.0000 | ORAL_TABLET | Freq: Four times a day (QID) | ORAL | Status: DC | PRN

## 2016-05-31 NOTE — Progress Notes (Signed)
Pre visit review using our clinic review tool, if applicable. No additional management support is needed unless otherwise documented below in the visit note. 

## 2016-05-31 NOTE — Progress Notes (Signed)
Subjective:    Patient ID: Douglas Mann, male    DOB: [birth year], 70 y.o.   MRN: [id number]  HPI Patient here to discuss several issues as follows:  End-stage COPD. He takes high-dose Advair and requesting refills. He is on oxygen 24/7. He refused Prevnar 13 last visit and continues to do so today. He feels his respiratory status is stable overall. No recent fever or productive cough  History of CAD. Dyslipidemia. On Lipitor. Overdue for follow-up labs.  No associated myalgias with Lipitor.  Chronic left hip and left thigh pain. Prior surgery left hip. He has frequent night pain and takes low-dose hydrocodone infrequently. He has some intermittent chronic constipation. His hip pains have not been relieved with over-the-counter Tylenol. He is very limited as far as ambulation and is mostly wheelchair bound.  BPH treated with Flomax. Symptomatically stable.  Past Medical History  Diagnosis Date  . HYPERLIPIDEMIA 10/01/2009  . HYPERTENSION 10/01/2009  . CAD 10/01/2009    Stent at [place] greater than 10 years ago  . PVD 10/01/2009  . ALLERGIC RHINITIS 12/24/2009  . COPD 10/01/2009  . OSTEOARTHRITIS, GENERALIZED, MULTIPLE JOINTS 12/24/2009   Past Surgical History  Procedure Laterality Date  . Fracture surgery  2012    ORIF r tibia fracture  . Tonsillectomy and adenoidectomy    . Left heart cath Bilateral 08/15/2014    Procedure: LEFT HEART CATH;  Surgeon: Leonie Man, MD;  Location: [facility] CATH LAB;  Service: Cardiovascular;  Laterality: Bilateral;  . Percutaneous coronary stent intervention (pci-s) N/A 08/16/2014    Procedure: PERCUTANEOUS CORONARY STENT INTERVENTION (PCI-S);  Surgeon: Leonie Man, MD;  Location: [facility] CATH LAB;  Service: Cardiovascular;  Laterality: N/A;  . Cardiac catheterization  08/16/2014    Procedure: IABP INSERTION;  Surgeon: Leonie Man, MD;  Location: [facility] CATH LAB;  Service: Cardiovascular;;    reports that he has quit smoking. His smoking use included  Cigarettes. He has a 75 pack-year smoking history. He has never used smokeless tobacco. He reports that he does not drink alcohol or use illicit drugs. family history includes CAD in his father. No Known Allergies    Review of Systems  Constitutional: Negative for fever, activity change, appetite change and fatigue.  HENT: Negative for congestion, ear pain and trouble swallowing.   Eyes: Negative for pain and visual disturbance.  Respiratory: Negative for cough, shortness of breath and wheezing.   Cardiovascular: Negative for chest pain and palpitations.  Gastrointestinal: Negative for nausea, vomiting, abdominal pain, diarrhea, constipation, blood in stool, abdominal distention and rectal pain.  Genitourinary: Negative for dysuria, hematuria and testicular pain.  Musculoskeletal: Negative for joint swelling and arthralgias.  Skin: Negative for rash.  Neurological: Negative for dizziness, syncope and headaches.  Hematological: Negative for adenopathy.  Psychiatric/Behavioral: Negative for confusion and dysphoric mood.       Objective:   Physical Exam  Constitutional: He is oriented to person, place, and time. He appears well-developed and well-nourished.  HENT:  Right Ear: External ear normal.  Left Ear: External ear normal.  Mouth/Throat: Oropharynx is clear and moist.  Neck: Neck supple. No thyromegaly present.  Cardiovascular: Normal rate and regular rhythm.   Pulmonary/Chest:  Somewhat diminished breath sounds throughout but clear. No respiratory distress. Pulse oximetry 99% on oxygen  Musculoskeletal: He exhibits no edema.  Neurological: He is alert and oriented to person, place, and time.          Assessment & Plan:  #1 COPD on chronic oxygen.  He is again encouraged to get Prevnar 13 but declines. Refill Advair for one year. Reminder for flu vaccine this fall  #2 dyslipidemia. History of CAD. Recheck lipid and hepatic panel  #3 chronic left hip pain. Refill  hydrocodone which he uses infrequently for severe pain.  #4 BPH. Symptomatically stable. Continue Flomax.  [name] MD [facility] at [facility]

## 2016-06-06 DIAGNOSIS — J449 Chronic obstructive pulmonary disease, unspecified: Secondary | ICD-10-CM | POA: Diagnosis not present

## 2016-06-22 DIAGNOSIS — J449 Chronic obstructive pulmonary disease, unspecified: Secondary | ICD-10-CM | POA: Diagnosis not present

## 2016-07-06 DIAGNOSIS — J449 Chronic obstructive pulmonary disease, unspecified: Secondary | ICD-10-CM | POA: Diagnosis not present

## 2016-07-23 DIAGNOSIS — J449 Chronic obstructive pulmonary disease, unspecified: Secondary | ICD-10-CM | POA: Diagnosis not present

## 2016-07-26 ENCOUNTER — Other Ambulatory Visit: Payer: Self-pay

## 2016-07-26 MED ORDER — FLUTICASONE-SALMETEROL 500-50 MCG/DOSE IN AEPB: 1.0000 | INHALATION_SPRAY | Freq: Two times a day (BID) | RESPIRATORY_TRACT | 3 refills | Status: DC

## 2016-08-06 DIAGNOSIS — J449 Chronic obstructive pulmonary disease, unspecified: Secondary | ICD-10-CM | POA: Diagnosis not present

## 2016-08-23 DIAGNOSIS — J449 Chronic obstructive pulmonary disease, unspecified: Secondary | ICD-10-CM | POA: Diagnosis not present

## 2016-09-03 ENCOUNTER — Ambulatory Visit: Payer: Commercial Managed Care - HMO | Admitting: Family Medicine

## 2016-09-06 ENCOUNTER — Ambulatory Visit (INDEPENDENT_AMBULATORY_CARE_PROVIDER_SITE_OTHER): Payer: Commercial Managed Care - HMO | Admitting: Family Medicine

## 2016-09-06 ENCOUNTER — Encounter: Payer: Self-pay | Admitting: Family Medicine

## 2016-09-06 VITALS — BP 168/74 | HR 84 | Temp 97.9°F | Ht 67.0 in | Wt 154.0 lb

## 2016-09-06 DIAGNOSIS — B0229 Other postherpetic nervous system involvement: Secondary | ICD-10-CM

## 2016-09-06 DIAGNOSIS — B029 Zoster without complications: Secondary | ICD-10-CM | POA: Diagnosis not present

## 2016-09-06 DIAGNOSIS — J449 Chronic obstructive pulmonary disease, unspecified: Secondary | ICD-10-CM | POA: Diagnosis not present

## 2016-09-06 MED ORDER — GABAPENTIN 300 MG PO CAPS: 300.0000 mg | ORAL_CAPSULE | Freq: Three times a day (TID) | ORAL | 3 refills | Status: DC

## 2016-09-06 NOTE — Patient Instructions (Signed)

## 2016-09-06 NOTE — Progress Notes (Signed)
Subjective:     Patient ID: Douglas Mann, male   DOB: [birth year], 70 y.o.   MRN: [id number]  HPI Patient seen with onset of skin rash about 3 weeks ago right thoracic region. This came up as a blistery rash has been very painful with burning type pain with severity 8/10. He is taken some occasional hydrocodone which is not helping much. Difficulty sleeping secondary to pain. No fevers or chills  Past Medical History:  Diagnosis Date  . ALLERGIC RHINITIS 12/24/2009  . CAD 10/01/2009   Stent at [place] greater than 10 years ago  . COPD 10/01/2009  . HYPERLIPIDEMIA 10/01/2009  . HYPERTENSION 10/01/2009  . 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 PVD 10/01/2009   Past Surgical History:  Procedure Laterality Date  . CARDIAC CATHETERIZATION  08/16/2014   Procedure: IABP INSERTION;  Surgeon: Leonie Man, MD;  Location: [facility] CATH LAB;  Service: Cardiovascular;;  . FRACTURE SURGERY  2012   ORIF r tibia fracture  . LEFT HEART CATH Bilateral 08/15/2014   Procedure: LEFT HEART CATH;  Surgeon: Leonie Man, MD;  Location: [facility] CATH LAB;  Service: Cardiovascular;  Laterality: Bilateral;  . PERCUTANEOUS CORONARY STENT INTERVENTION (PCI-S) N/A 08/16/2014   Procedure: PERCUTANEOUS CORONARY STENT INTERVENTION (PCI-S);  Surgeon: Leonie Man, MD;  Location: [facility] CATH LAB;  Service: Cardiovascular;  Laterality: N/A;  . TONSILLECTOMY AND ADENOIDECTOMY      reports that he has quit smoking. His smoking use included Cigarettes. He has a 75.00 pack-year smoking history. He has never used smokeless tobacco. He reports that he does not drink alcohol or use drugs. family history includes CAD in his father. No Known Allergies   Review of Systems  Constitutional: Negative for chills and fever.  Cardiovascular: Negative for chest pain.       Objective:   Physical Exam  Constitutional: He appears well-developed and well-nourished.  Cardiovascular: Normal rate and regular rhythm.    Pulmonary/Chest:  Diminished breath sounds throughout which is his baseline  Skin:  Crusted rash in dermatomal distribution right thoracic area around T8-T10 distribution       Assessment:     Recent shingles with postherpetic neuralgia Moderate to severe pain not controlled with hydrocodone    Plan:     -Did not recommend Valtrex since this occurred about 3 weeks ago -Gabapentin 300 mg daily at bedtime and increase every couple days up to target dose of one 3 times daily. Reviewed potential side effects -Recommend flu vaccine and patient declines  [name] MD [facility]

## 2016-09-22 ENCOUNTER — Telehealth: Payer: Self-pay | Admitting: Family Medicine

## 2016-09-22 DIAGNOSIS — J449 Chronic obstructive pulmonary disease, unspecified: Secondary | ICD-10-CM | POA: Diagnosis not present

## 2016-09-22 MED ORDER — GABAPENTIN 300 MG PO CAPS: 300.0000 mg | ORAL_CAPSULE | Freq: Three times a day (TID) | ORAL | 3 refills | Status: DC

## 2016-09-22 NOTE — Telephone Encounter (Signed)
Pt needs early refill on gabapentin 300 mg. Pt has about 8 shingles sores on back. [facility]

## 2016-09-22 NOTE — Telephone Encounter (Signed)
Medication sent in for patient. 

## 2016-10-04 ENCOUNTER — Other Ambulatory Visit: Payer: Self-pay | Admitting: Family Medicine

## 2016-10-05 ENCOUNTER — Telehealth: Payer: Self-pay | Admitting: Family Medicine

## 2016-10-05 NOTE — Telephone Encounter (Signed)
Pt need a refill on his medication tamsulosin (FLOMAX) 0.4 MG CAPS capsule and  levothyroxine (SYNTHROID, LEVOTHROID) 25 MCG tablet  Need a 90 day supply with 3 refills  . Any question please contact patient  . Patient was told by pharmacy to contact his MD

## 2016-10-05 NOTE — Telephone Encounter (Signed)
Pt state that the pharmacy called and was needing new Rx for two of his medications and he just can not remember which ones they are.  Pt will try to find out which ones they are and give [place] a call back with the names.

## 2016-10-05 NOTE — Telephone Encounter (Signed)
Medication filled for patient.

## 2016-10-06 DIAGNOSIS — J449 Chronic obstructive pulmonary disease, unspecified: Secondary | ICD-10-CM | POA: Diagnosis not present

## 2016-10-23 DIAGNOSIS — J449 Chronic obstructive pulmonary disease, unspecified: Secondary | ICD-10-CM | POA: Diagnosis not present

## 2016-11-06 DIAGNOSIS — J449 Chronic obstructive pulmonary disease, unspecified: Secondary | ICD-10-CM | POA: Diagnosis not present

## 2016-11-22 DIAGNOSIS — J449 Chronic obstructive pulmonary disease, unspecified: Secondary | ICD-10-CM | POA: Diagnosis not present

## 2016-11-23 ENCOUNTER — Ambulatory Visit: Payer: Commercial Managed Care - HMO | Admitting: Family Medicine

## 2016-12-06 DIAGNOSIS — J449 Chronic obstructive pulmonary disease, unspecified: Secondary | ICD-10-CM | POA: Diagnosis not present

## 2016-12-07 ENCOUNTER — Other Ambulatory Visit: Payer: Self-pay | Admitting: Family Medicine

## 2016-12-22 ENCOUNTER — Telehealth: Payer: Self-pay | Admitting: Family Medicine

## 2016-12-22 NOTE — Telephone Encounter (Signed)
Okay to refill? Please advise.  

## 2016-12-22 NOTE — Telephone Encounter (Signed)
Pt request refill  °HYDROcodone-acetaminophen (NORCO) 7.5-325 MG tablet °

## 2016-12-22 NOTE — Telephone Encounter (Signed)
He takes this very infrequently for severe arthritis pain.  May refill once.

## 2016-12-23 MED ORDER — HYDROCODONE-ACETAMINOPHEN 7.5-325 MG PO TABS: 1.0000 | ORAL_TABLET | Freq: Four times a day (QID) | ORAL | 0 refills | Status: DC | PRN

## 2016-12-23 NOTE — Telephone Encounter (Signed)
Refill waiting up front for pt

## 2017-01-08 ENCOUNTER — Other Ambulatory Visit: Payer: Self-pay | Admitting: Cardiology

## 2017-01-08 DIAGNOSIS — I5022 Chronic systolic (congestive) heart failure: Secondary | ICD-10-CM

## 2017-02-18 ENCOUNTER — Telehealth: Payer: Self-pay | Admitting: Family Medicine

## 2017-02-18 NOTE — Telephone Encounter (Signed)
It looks like it has been several months since he had shingles. I would recommend a follow-up visit, or that this be addressed by his PCP.

## 2017-02-18 NOTE — Telephone Encounter (Signed)
I called the pt and informed him of the message below and he stated he will wait until his appt with Dr Elease Hashimoto on 3/19.  Message sent to Dr Elease Hashimoto as Juluis Rainier.

## 2017-02-18 NOTE — Telephone Encounter (Signed)
° °  Pt said he still has a little shingles on his back and is asking if he need to refill the below med    Pt request refill of the following:  gabapentin (NEURONTIN) 300 MG capsule   Phamacy:  [facility]

## 2017-02-21 ENCOUNTER — Other Ambulatory Visit: Payer: Self-pay | Admitting: Cardiology

## 2017-02-21 ENCOUNTER — Other Ambulatory Visit: Payer: Self-pay | Admitting: Family Medicine

## 2017-02-21 DIAGNOSIS — I5022 Chronic systolic (congestive) heart failure: Secondary | ICD-10-CM

## 2017-03-02 ENCOUNTER — Telehealth: Payer: Self-pay | Admitting: Family Medicine

## 2017-03-02 NOTE — Telephone Encounter (Signed)
Pharmacy calling pt need new Rx for atorvastatin,carvedilol and clopidogrel   Pharm:  [place] [phone]

## 2017-03-02 NOTE — Telephone Encounter (Signed)
These medicaiton refill requests were sent to cardiology

## 2017-03-03 ENCOUNTER — Telehealth: Payer: Self-pay | Admitting: Family Medicine

## 2017-03-03 DIAGNOSIS — I5022 Chronic systolic (congestive) heart failure: Secondary | ICD-10-CM

## 2017-03-03 NOTE — Telephone Encounter (Signed)
° °  Pt request refill of the following:  clopidogrel (PLAVIX) 75 MG tablet  carvedilol (COREG) 3.125 MG   atorvastatin (LIPITOR) 40 MG    Phamacy:  [facility]

## 2017-03-04 ENCOUNTER — Other Ambulatory Visit: Payer: Self-pay | Admitting: Cardiology

## 2017-03-04 DIAGNOSIS — I5022 Chronic systolic (congestive) heart failure: Secondary | ICD-10-CM

## 2017-03-04 MED ORDER — CLOPIDOGREL BISULFATE 75 MG PO TABS: 75.0000 mg | ORAL_TABLET | Freq: Every day | ORAL | 1 refills | Status: DC

## 2017-03-04 MED ORDER — CARVEDILOL 3.125 MG PO TABS: 3.1250 mg | ORAL_TABLET | Freq: Two times a day (BID) | ORAL | 1 refills | Status: DC

## 2017-03-04 MED ORDER — ATORVASTATIN CALCIUM 40 MG PO TABS: 40.0000 mg | ORAL_TABLET | Freq: Every day | ORAL | 1 refills | Status: DC

## 2017-03-04 NOTE — Telephone Encounter (Signed)
rx sent

## 2017-03-04 NOTE — Telephone Encounter (Signed)
Last refills 01/22/16 by Dr Haroldine Laws.  Last office visit 09/06/16.  Okay to fill?

## 2017-03-04 NOTE — Telephone Encounter (Signed)
Refill for 6 months. 

## 2017-03-07 ENCOUNTER — Encounter: Payer: Commercial Managed Care - HMO | Admitting: Family Medicine

## 2017-03-10 ENCOUNTER — Telehealth: Payer: Self-pay | Admitting: Family Medicine

## 2017-03-10 DIAGNOSIS — I5022 Chronic systolic (congestive) heart failure: Secondary | ICD-10-CM

## 2017-03-10 NOTE — Telephone Encounter (Signed)
° ° ° °  [facility] did not received the below meds please resend   atorvastatin (LIPITOR) 40 MG tablet  carvedilol (COREG) 3.125 MG tablet   clopidogrel (PLAVIX) 75 MG tablet

## 2017-03-10 NOTE — Telephone Encounter (Signed)
° ° °  Pt call to say [facility] mail order did not received the RX's that was sent in.   atorvastatin (LIPITOR) 40 MG tablet  carvedilol (COREG) 3.125 MG tablet  clopidogrel (PLAVIX) 75 MG tablet    Please resend

## 2017-03-11 MED ORDER — CLOPIDOGREL BISULFATE 75 MG PO TABS: 75.0000 mg | ORAL_TABLET | Freq: Every day | ORAL | 1 refills | Status: DC

## 2017-03-11 MED ORDER — CARVEDILOL 3.125 MG PO TABS: 3.1250 mg | ORAL_TABLET | Freq: Two times a day (BID) | ORAL | 1 refills | Status: DC

## 2017-03-11 MED ORDER — ATORVASTATIN CALCIUM 40 MG PO TABS: 40.0000 mg | ORAL_TABLET | Freq: Every day | ORAL | 1 refills | Status: DC

## 2017-03-11 NOTE — Telephone Encounter (Signed)
Rx resent.

## 2017-03-14 ENCOUNTER — Other Ambulatory Visit: Payer: Self-pay | Admitting: Family Medicine

## 2017-04-20 ENCOUNTER — Ambulatory Visit (INDEPENDENT_AMBULATORY_CARE_PROVIDER_SITE_OTHER): Payer: Medicare HMO | Admitting: Family Medicine

## 2017-04-20 VITALS — BP 160/80 | HR 88 | Temp 97.9°F | Ht 67.0 in | Wt 162.0 lb

## 2017-04-20 DIAGNOSIS — D649 Anemia, unspecified: Secondary | ICD-10-CM | POA: Diagnosis not present

## 2017-04-20 DIAGNOSIS — Z0000 Encounter for general adult medical examination without abnormal findings: Secondary | ICD-10-CM

## 2017-04-20 DIAGNOSIS — N183 Chronic kidney disease, stage 3 unspecified: Secondary | ICD-10-CM

## 2017-04-20 DIAGNOSIS — E785 Hyperlipidemia, unspecified: Secondary | ICD-10-CM

## 2017-04-20 DIAGNOSIS — L853 Xerosis cutis: Secondary | ICD-10-CM

## 2017-04-20 DIAGNOSIS — Z23 Encounter for immunization: Secondary | ICD-10-CM

## 2017-04-20 DIAGNOSIS — I5022 Chronic systolic (congestive) heart failure: Secondary | ICD-10-CM | POA: Diagnosis not present

## 2017-04-20 DIAGNOSIS — E039 Hypothyroidism, unspecified: Secondary | ICD-10-CM | POA: Diagnosis not present

## 2017-04-20 DIAGNOSIS — J449 Chronic obstructive pulmonary disease, unspecified: Secondary | ICD-10-CM | POA: Diagnosis not present

## 2017-04-20 LAB — CBC WITH DIFFERENTIAL/PLATELET
BASOS PCT: 0.8 % (ref 0.0–3.0)
Basophils Absolute: 0.1 10*3/uL (ref 0.0–0.1)
EOS ABS: 0.2 10*3/uL (ref 0.0–0.7)
EOS PCT: 2.5 % (ref 0.0–5.0)
HEMATOCRIT: 35.9 % — AB (ref 39.0–52.0)
HEMOGLOBIN: 11.6 g/dL — AB (ref 13.0–17.0)
LYMPHS PCT: 25.9 % (ref 12.0–46.0)
Lymphs Abs: 1.8 10*3/uL (ref 0.7–4.0)
MCHC: 32.3 g/dL (ref 30.0–36.0)
MCV: 88.2 fl (ref 78.0–100.0)
MONO ABS: 0.8 10*3/uL (ref 0.1–1.0)
Monocytes Relative: 11.1 % (ref 3.0–12.0)
Neutro Abs: 4.2 10*3/uL (ref 1.4–7.7)
Neutrophils Relative %: 59.7 % (ref 43.0–77.0)
Platelets: 247 10*3/uL (ref 150.0–400.0)
RBC: 4.07 Mil/uL — AB (ref 4.22–5.81)
RDW: 15.3 % (ref 11.5–15.5)
WBC: 7 10*3/uL (ref 4.0–10.5)

## 2017-04-20 LAB — LIPID PANEL
CHOLESTEROL: 201 mg/dL — AB (ref 0–200)
HDL: 57.5 mg/dL (ref >39.00)
LDL Cholesterol: 119 mg/dL — ABNORMAL HIGH (ref 0–99)
NonHDL: 143.06
Total CHOL/HDL Ratio: 3
Triglycerides: 121 mg/dL (ref 0.0–149.0)
VLDL: 24.2 mg/dL (ref 0.0–40.0)

## 2017-04-20 LAB — HEPATIC FUNCTION PANEL
ALK PHOS: 93 U/L (ref 39–117)
ALT: 14 U/L (ref 0–53)
AST: 13 U/L (ref 0–37)
Albumin: 4.1 g/dL (ref 3.5–5.2)
BILIRUBIN TOTAL: 0.6 mg/dL (ref 0.2–1.2)
Bilirubin, Direct: 0.1 mg/dL (ref 0.0–0.3)
Total Protein: 7 g/dL (ref 6.0–8.3)

## 2017-04-20 LAB — BASIC METABOLIC PANEL
BUN: 12 mg/dL (ref 6–23)
CALCIUM: 9.3 mg/dL (ref 8.4–10.5)
CO2: 31 mEq/L (ref 19–32)
CREATININE: 1.1 mg/dL (ref 0.40–1.50)
Chloride: 99 mEq/L (ref 96–112)
GFR: 69.71 mL/min (ref >60.00)
GLUCOSE: 99 mg/dL (ref 70–99)
POTASSIUM: 4.6 meq/L (ref 3.5–5.1)
Sodium: 135 mEq/L (ref 135–145)

## 2017-04-20 LAB — TSH: TSH: 2.86 u[IU]/mL (ref 0.35–4.50)

## 2017-04-20 MED ORDER — PNEUMOCOCCAL 13-VAL CONJ VACC IM SUSP: 0.5000 mL | INTRAMUSCULAR | Status: DC

## 2017-04-20 MED ORDER — HYDROCODONE-ACETAMINOPHEN 7.5-325 MG PO TABS: 1.0000 | ORAL_TABLET | ORAL | 0 refills | Status: DC | PRN

## 2017-04-20 MED ORDER — TRIAMCINOLONE ACETONIDE 0.1 % EX CREA: 1.0000 "application " | TOPICAL_CREAM | Freq: Two times a day (BID) | CUTANEOUS | 1 refills | Status: DC | PRN

## 2017-04-20 NOTE — Progress Notes (Signed)
Pre visit review using our clinic review tool, if applicable. No additional management support is needed unless otherwise documented below in the visit note. 

## 2017-04-20 NOTE — Progress Notes (Addendum)
Subjective:   Douglas Mann is a 71 y.o. male who presents for Medicare Annual/Subsequent preventive examination.   Cardiac Risk Factors include: advanced age (>73men, >24 women);male gender;family history of premature cardiovascular disease;hypertension;sedentary lifestyle HRA assessment completed during this visit with Douglas Mann   The Patient was informed that the wellness visit is to identify future health risk and educate and initiate measures that can reduce risk for increased disease through the lifespan.    NO ROS; Medicare Wellness Visit Last OV:  Today w Dr. Elease Hashimoto  Labs completed: 05/2016 chol/hdl ratio is 3; cho 167; HDL 51 and trig 82  A1c 5.8 in 2015   Describes health as fair, good or great? Good    Update:  Tobacco: former smoker; 1.5 packs x 50 years with 75 pack hx  Quit 2 years ago? Quit x 71 yo  Discussed AAA screen and LDCT but he declines due to COPD and does not want any treatment not absolutely necessary  How many drinks do you have per week? none  HD: stent 07/2014 Family hx of father dying at 50 of MI   Medications reviewed and instructed by Dr. Sinclair Ship   BMI: 24- 25    Diet; maintaining BMI  Has Breakfast and lunch at the same time  Has meals on wheels; very good meals Supper; sandwich; [place] fish sandwich  Educated caregiver on small amounts of food through out the day  States he fills easily and needs to rest; but can warm up food every 2 hours and eat   States he gets Out of breath fixing a sandwich Care giver lives next door Has a dtr in [place]; Douglas Mann is the neighbor and primary caregiver for am care during the day   Teeth or Denture issues? No teeth  States he eats anything he wants   Exercise;   Goes to the LR to the bathroom with oxygen at 4 liters 3 liters during the day while up  Does some chair exercise in chair for upper body and legs   Curtailed assessment due to Oxygen requirements.   HOME SAFETY;  very careful not to fall  Fall hx; not this year, fell x 1 last year  Given education on "Fall Prevention in the Home" for more safety tips the patient can apply as appropriate.  Long term goal is to "age in place" or undecided    Mental Health:  Any emotional problems? Anxious, depressed, irritable, sad or blue? no Denies feeling depressed or hopeless; voices pleasure in daily life How many social activities have you been engaged in within the last 2 weeks? no Who would help you with chores; illness; shopping other? Douglas Mann who is with him today  Pain: has some pain at hs secondary to hip.   Cognitive; no issues; manages his meds and his finances and dtr oversees  Manages checkbook, medications; no failures of task Ad8 score reviewed for issues;  Issues making decisions; no  Less interest in hobbies / activities" no  Repeats questions, stories; family complaining: NO  Trouble using ordinary gadgets; microwave; computer: no  Forgets the month or year: no  Mismanaging finances: no  Missing apt: no but does write them down  Daily problems with thinking of memory NO Ad8 score is 0    Mobilization and Functional losses from last year to this year?   Hearing Screening Comments: Hearing is good  No issues  Vision Screening Comments: Vision check; not sure but can see well  Advanced Directive addressed; Completed o    Colonoscopy; 02/1994 no report - will take out of system as he is not a surgical candidate and declines  Hep C - Hep C declined  Tdap - declined  PCV 13 - will take this today         Objective:    Vitals: BP (!) 160/80 (BP Location: Left Arm, Patient Position: Sitting, Cuff Size: Normal)   Pulse 88   Temp 97.9 F (36.6 C) (Oral)   Ht 5\' 7"  (1.702 m)   Wt 162 lb (73.5 kg)   SpO2 98% Comment: 3 liters oxygen  BMI 25.37 kg/m   Body mass index is 25.37 kg/m.  Tobacco History  Smoking Status  . Former Smoker  . Packs/day: 1.50  . Years: 50.00   . Types: Cigarettes  Smokeless Tobacco  . Never Used    Comment: quit  about 2 years ago     Counseling given: Not Answered   Past Medical History:  Diagnosis Date  . ALLERGIC RHINITIS 12/24/2009  . CAD 10/01/2009   Stent at [place] greater than 10 years ago  . COPD 10/01/2009  . HYPERLIPIDEMIA 10/01/2009  . HYPERTENSION 10/01/2009  . 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 PVD 10/01/2009   Past Surgical History:  Procedure Laterality Date  . CARDIAC CATHETERIZATION  08/16/2014   Procedure: IABP INSERTION;  Surgeon: Leonie Man, MD;  Location: [facility] CATH LAB;  Service: Cardiovascular;;  . FRACTURE SURGERY  2012   ORIF r tibia fracture  . LEFT HEART CATH Bilateral 08/15/2014   Procedure: LEFT HEART CATH;  Surgeon: Leonie Man, MD;  Location: [facility] CATH LAB;  Service: Cardiovascular;  Laterality: Bilateral;  . PERCUTANEOUS CORONARY STENT INTERVENTION (PCI-S) N/A 08/16/2014   Procedure: PERCUTANEOUS CORONARY STENT INTERVENTION (PCI-S);  Surgeon: Leonie Man, MD;  Location: [facility] CATH LAB;  Service: Cardiovascular;  Laterality: N/A;  . TONSILLECTOMY AND ADENOIDECTOMY     Family History  Problem Relation Age of Onset  . CAD Father     Died age 67 MI   History  Sexual Activity  . Sexual activity: Not on file    Outpatient Encounter Prescriptions as of 04/20/2017  Medication Sig  . albuterol (VENTOLIN HFA) 108 (90 Base) MCG/ACT inhaler Inhale 2 puffs into the lungs every 6 (six) hours as needed.  Marland Kitchen amiodarone (PACERONE) 200 MG tablet TAKE 1/2 TABLET EVERY DAY  . aspirin 81 MG tablet Take 81 mg by mouth every other day.   Marland Kitchen atorvastatin (LIPITOR) 40 MG tablet Take 1 tablet (40 mg total) by mouth daily.  . carvedilol (COREG) 3.125 MG tablet Take 1 tablet (3.125 mg total) by mouth 2 (two) times daily.  . clopidogrel (PLAVIX) 75 MG tablet Take 1 tablet (75 mg total) by mouth daily.  . fish oil-omega-3 fatty acids 1000 MG capsule Take 1 g by mouth daily.   .  Fluticasone-Salmeterol (ADVAIR) 500-50 MCG/DOSE AEPB Inhale 1 puff into the lungs 2 (two) times daily.  . furosemide (LASIX) 40 MG tablet Take 1 tablet (40 mg total) by mouth as needed.  Marland Kitchen HYDROcodone-acetaminophen (NORCO) 7.5-325 MG tablet Take 1 tablet by mouth every 4 (four) hours as needed for moderate pain.  . hydrocortisone (ANUSOL-HC) 2.5 % rectal cream apply rectally twice a day if needed for HEMORRHOIDS or itching  . levothyroxine (SYNTHROID, LEVOTHROID) 25 MCG tablet TAKE 1 TABLET ONE TIME DAILY (NEED MD APPOINTMENT)  . RANEXA 500 MG 12 hr tablet TAKE 1 TABLET TWICE  DAILY  . tamsulosin (FLOMAX) 0.4 MG CAPS capsule TAKE 1 CAPSULE EVERY DAY  . [DISCONTINUED] gabapentin (NEURONTIN) 300 MG capsule Take 1 capsule (300 mg total) by mouth 3 (three) times daily.  . [DISCONTINUED] HYDROcodone-acetaminophen (NORCO) 7.5-325 MG tablet Take 1 tablet by mouth every 6 (six) hours as needed for moderate pain.  Marland Kitchen triamcinolone cream (KENALOG) 0.1 % Apply 1 application topically 2 (two) times daily as needed. Compound 1:1 with Eucerin and apply to affected skin bid prn  . [DISCONTINUED] HYDROcodone-acetaminophen (NORCO) 7.5-325 MG tablet Take 1 tablet by mouth every 6 (six) hours as needed for moderate pain.   Facility-Administered Encounter Medications as of 04/20/2017  Medication  . [START ON 04/21/2017] pneumococcal 13-valent conjugate vaccine (PREVNAR 13) injection 0.5 mL    Activities of Daily Living In your present state of health, do you have any difficulty performing the following activities: 04/20/2017  Hearing? N  Vision? N  Difficulty concentrating or making decisions? N  Walking or climbing stairs? Y  Dressing or bathing? Y  Doing errands, shopping? Y  Preparing Food and eating ? Y  Using the Toilet? Y  In the past six months, have you accidently leaked urine? N  Do you have problems with loss of bowel control? N  Managing your Medications? N  Managing your Finances? N  Housekeeping or  managing your Housekeeping? Y  Some recent data might be hidden    Patient Care Team: [name], MD as PCP - General   Assessment:     Exercise Activities and Dietary recommendations Current Exercise Habits: Home exercise routine, Time (Minutes): 15, Frequency (Times/Week): 2, Weekly Exercise (Minutes/Week): 30  Goals    . patient          Continue to maintain your health       Fall Risk Fall Risk  04/20/2017 04/20/2017 03/02/2016 01/01/2015  Falls in the past year? No No No No   Depression Screen PHQ 2/9 Scores 04/20/2017 04/20/2017 03/02/2016 01/01/2015  PHQ - 2 Score 0 0 0 0    Cognitive Function; mood very good; Affect engaging.  Still manages the things he can; does not drive; has good support for now in home         Immunization History  Administered Date(s) Administered  . Influenza Split 10/07/2012  . Influenza Whole 08/28/2009, 08/19/2010, 09/04/2011  . Influenza, High Dose Seasonal PF 08/29/2015  . Influenza,inj,Quad PF,36+ Mos 09/14/2013, 08/07/2014  . PPD Test 08/29/2015  . Pneumococcal Conjugate-13 04/20/2017  . Pneumococcal Polysaccharide-23 10/01/2009   Screening Tests Health Maintenance  Topic Date Due  . Hepatitis C Screening  1946/03/26  . TETANUS/TDAP  02/18/1963  . COLONOSCOPY  02/17/1994  . PNA vac Low Risk Adult (2 of 2 - PCV13) 10/01/2010  . INFLUENZA VACCINE  07/20/2017      Plan:      PCP Notes  Health Maintenance Declines tdap, will postpone  Declines Hep C; will take out of HM Declines colonoscopy due to health status; will take out of o/d screens  Educated regarding prevnar and did agree to take this today   Abnormal Screens  Needs eye exam but states his vision is very good   Referrals none  Patient concerns; none voiced; dr. Elease Hashimoto managing his BP   Nurse Concerns; stable with disability at present in home with support Limited in diet due to receipt of meals on wheels;  Also goes out to eat or orders in and  caregiver picks food up. Gets very  sob when cooking; Sodium control would be difficult to manage.   Next PCP apt as directed to up BP     I have personally reviewed and noted the following in the patient's chart:   . Medical and social history . Use of alcohol, tobacco or illicit drugs  . Current medications and supplements . Functional ability and status . Nutritional status . Physical activity . Advanced directives . List of other physicians . Hospitalizations, surgeries, and ER visits in previous 12 months . Vitals . Screenings to include cognitive, depression, and falls . Referrals and appointments  In addition, I have reviewed and discussed with patient certain preventive protocols, quality metrics, and best practice recommendations. A written personalized care plan for preventive services as well as general preventive health recommendations were provided to patient.     URKYH,CWCBJ, RN  04/20/2017  Agree with assessment as above.  Prevnar 13 given.  We are addressing his elevated blood pressure.  [name] MD [facility] at [facility]
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
## 2017-04-20 NOTE — Patient Instructions (Addendum)
We will call you with labs done today We have sent prescription in to pharmacy for skin rash   Douglas Mann , Thank you for taking time to come for your Medicare Wellness Visit. I appreciate your ongoing commitment to your health goals. Please review the following plan we discussed and let me know if I can assist you in the future.   Will try to schedule eye exam if possible or if vision changes  I have postponed your Tdap, but if you get cut or have a "dirty" wound, you will need to come to the office and take this.  You have had your last pneumonia vaccine called the Prevnar 13 today  I have taken a future colonoscopy out of the system at your request. This will not be discussed again unless you have bleeding or other stomach issues which may warrant it.  It was a pleasure to meet you!     These are the goals we discussed: Goals    . patient          Continue to maintain your health        This is a list of the screening recommended for you and due dates:  Health Maintenance  Topic Date Due  .  Hepatitis C: One time screening is recommended by Center for Disease Control  (CDC) for  adults born from [birth year] through 1967.   09/26/1946  . Tetanus Vaccine  02/18/1963  . Colon Cancer Screening  02/17/1994  . Pneumonia vaccines (2 of 2 - PCV13) 10/01/2010  . Flu Shot  07/20/2017    Health Maintenance, Male A healthy lifestyle and preventive care is important for your health and wellness. Ask your health care provider about what schedule of regular examinations is right for you. What should I know about weight and diet?  Eat a Healthy Diet  Eat plenty of vegetables, fruits, whole grains, low-fat dairy products, and lean protein.  Do not eat a lot of foods high in solid fats, added sugars, or salt. Maintain a Healthy Weight  Regular exercise can help you achieve or maintain a healthy weight. You should:  Do at least 150 minutes of exercise each week. The exercise should increase  your heart rate and make you sweat (moderate-intensity exercise).  Do strength-training exercises at least twice a week. Watch Your Levels of Cholesterol and Blood Lipids  Have your blood tested for lipids and cholesterol every 5 years starting at 73 years of age. If you are at high risk for heart disease, you should start having your blood tested when you are 73 years old. You may need to have your cholesterol levels checked more often if:  Your lipid or cholesterol levels are high.  You are older than 73 years of age.  You are at high risk for heart disease. What should I know about cancer screening? Many types of cancers can be detected early and may often be prevented. Lung Cancer  You should be screened every year for lung cancer if:  You are a current smoker who has smoked for at least 30 years.  You are a former smoker who has quit within the past 15 years.  Talk to your health care provider about your screening options, when you should start screening, and how often you should be screened. Colorectal Cancer  Routine colorectal cancer screening usually begins at 73 years of age and should be repeated every 5-10 years until you are 73 years old. You may need to  be screened more often if early forms of precancerous polyps or small growths are found. Your health care provider may recommend screening at an earlier age if you have risk factors for colon cancer.  Your health care provider may recommend using home test kits to check for hidden blood in the stool.  A small camera at the end of a tube can be used to examine your colon (sigmoidoscopy or colonoscopy). This checks for the earliest forms of colorectal cancer. Prostate and Testicular Cancer  Depending on your age and overall health, your health care provider may do certain tests to screen for prostate and testicular cancer.  Talk to your health care provider about any symptoms or concerns you have about testicular or  prostate cancer. Skin Cancer  Check your skin from head to toe regularly.  Tell your health care provider about any new moles or changes in moles, especially if:  There is a change in a mole's size, shape, or color.  You have a mole that is larger than a pencil eraser.  Always use sunscreen. Apply sunscreen liberally and repeat throughout the day.  Protect yourself by wearing long sleeves, pants, a wide-brimmed hat, and sunglasses when outside. What should I know about heart disease, diabetes, and high blood pressure?  If you are 68-25 years of age, have your blood pressure checked every 3-5 years. If you are 51 years of age or older, have your blood pressure checked every year. You should have your blood pressure measured twice-once when you are at a hospital or clinic, and once when you are not at a hospital or clinic. Record the average of the two measurements. To check your blood pressure when you are not at a hospital or clinic, you can use:  An automated blood pressure machine at a pharmacy.  A home blood pressure monitor.  Talk to your health care provider about your target blood pressure.  If you are between 69-34 years old, ask your health care provider if you should take aspirin to prevent heart disease.  Have regular diabetes screenings by checking your fasting blood sugar level.  If you are at a normal weight and have a low risk for diabetes, have this test once every three years after the age of 27.  If you are overweight and have a high risk for diabetes, consider being tested at a younger age or more often.  A one-time screening for abdominal aortic aneurysm (AAA) by ultrasound is recommended for men aged 74-75 years who are current or former smokers. What should I know about preventing infection? Hepatitis B  If you have a higher risk for hepatitis B, you should be screened for this virus. Talk with your health care provider to find out if you are at risk for  hepatitis B infection. Hepatitis C  Blood testing is recommended for:  Everyone born from [birth year] through 1965.  Anyone with known risk factors for hepatitis C. Sexually Transmitted Diseases (STDs)  You should be screened each year for STDs including gonorrhea and chlamydia if:  You are sexually active and are younger than 73 years of age.  You are older than 73 years of age and your health care provider tells you that you are at risk for this type of infection.  Your sexual activity has changed since you were last screened and you are at an increased risk for chlamydia or gonorrhea. Ask your health care provider if you are at risk.  Talk with your health care  provider about whether you are at high risk of being infected with HIV. Your health care provider may recommend a prescription medicine to help prevent HIV infection. What else can I do?  Schedule regular health, dental, and eye exams.  Stay current with your vaccines (immunizations).  Do not use any tobacco products, such as cigarettes, chewing tobacco, and e-cigarettes. If you need help quitting, ask your health care provider.  Limit alcohol intake to no more than 2 drinks per day. One drink equals 12 ounces of beer, 5 ounces of wine, or 1 ounces of hard liquor.  Do not use street drugs.  Do not share needles.  Ask your health care provider for help if you need support or information about quitting drugs.  Tell your health care provider if you often feel depressed.  Tell your health care provider if you have ever been abused or do not feel safe at home. This information is not intended to replace advice given to you by your health care provider. Make sure you discuss any questions you have with your health care provider. Document Released: 06/03/2008 Document Revised: 08/04/2016 Document Reviewed: 09/09/2015 Elsevier Interactive Patient Education  2017 [employer] Prevention in the Home Falls can cause  injuries and can affect people from all age groups. There are many simple things that you can do to make your home safe and to help prevent falls. What can I do on the outside of my home?  Regularly repair the edges of walkways and driveways and fix any cracks.  Remove high doorway thresholds.  Trim any shrubbery on the main path into your home.  Use bright outdoor lighting.  Clear walkways of debris and clutter, including tools and rocks.  Regularly check that handrails are securely fastened and in good repair. Both sides of any steps should have handrails.  Install guardrails along the edges of any raised decks or porches.  Have leaves, snow, and ice cleared regularly.  Use sand or salt on walkways during winter months.  In the garage, clean up any spills right away, including grease or oil spills. What can I do in the bathroom?  Use night lights.  Install grab bars by the toilet and in the tub and shower. Do not use towel bars as grab bars.  Use non-skid mats or decals on the floor of the tub or shower.  If you need to sit down while you are in the shower, use a plastic, non-slip stool.  Keep the floor dry. Immediately clean up any water that spills on the floor.  Remove soap buildup in the tub or shower on a regular basis.  Attach bath mats securely with double-sided non-slip rug tape.  Remove throw rugs and other tripping hazards from the floor. What can I do in the bedroom?  Use night lights.  Make sure that a bedside light is easy to reach.  Do not use oversized bedding that drapes onto the floor.  Have a firm chair that has side arms to use for getting dressed.  Remove throw rugs and other tripping hazards from the floor. What can I do in the kitchen?  Clean up any spills right away.  Avoid walking on wet floors.  Place frequently used items in easy-to-reach places.  If you need to reach for something above you, use a sturdy step stool that has a grab  bar.  Keep electrical cables out of the way.  Do not use floor polish or wax that  makes floors slippery. If you have to use wax, make sure that it is non-skid floor wax.  Remove throw rugs and other tripping hazards from the floor. What can I do in the stairways?  Do not leave any items on the stairs.  Make sure that there are handrails on both sides of the stairs. Fix handrails that are broken or loose. Make sure that handrails are as long as the stairways.  Check any carpeting to make sure that it is firmly attached to the stairs. Fix any carpet that is loose or worn.  Avoid having throw rugs at the top or bottom of stairways, or secure the rugs with carpet tape to prevent them from moving.  Make sure that you have a light switch at the top of the stairs and the bottom of the stairs. If you do not have them, have them installed. What are some other fall prevention tips?  Wear closed-toe shoes that fit well and support your feet. Wear shoes that have rubber soles or low heels.  When you use a stepladder, make sure that it is completely opened and that the sides are firmly locked. Have someone hold the ladder while you are using it. Do not climb a closed stepladder.  Add color or contrast paint or tape to grab bars and handrails in your home. Place contrasting color strips on the first and last steps.  Use mobility aids as needed, such as canes, walkers, scooters, and crutches.  Turn on lights if it is dark. Replace any light bulbs that burn out.  Set up furniture so that there are clear paths. Keep the furniture in the same spot.  Fix any uneven floor surfaces.  Choose a carpet design that does not hide the edge of steps of a stairway.  Be aware of any and all pets.  Review your medicines with your healthcare provider. Some medicines can cause dizziness or changes in blood pressure, which increase your risk of falling. Talk with your health care provider about other ways that  you can decrease your risk of falls. This may include working with a physical therapist or trainer to improve your strength, balance, and endurance. This information is not intended to replace advice given to you by your health care provider. Make sure you discuss any questions you have with your health care provider. Document Released: 11/26/2002 Document Revised: 05/04/2016 Document Reviewed: 01/10/2015 Elsevier Interactive Patient Education  2017 [employer].

## 2017-04-21 MED ORDER — LISINOPRIL 5 MG PO TABS: 5.0000 mg | ORAL_TABLET | Freq: Every day | ORAL | 5 refills | Status: DC

## 2017-04-27 ENCOUNTER — Other Ambulatory Visit: Payer: Self-pay | Admitting: Family Medicine

## 2017-05-11 ENCOUNTER — Encounter: Payer: Self-pay | Admitting: Family Medicine

## 2017-05-11 ENCOUNTER — Ambulatory Visit (INDEPENDENT_AMBULATORY_CARE_PROVIDER_SITE_OTHER): Payer: Medicare HMO | Admitting: Family Medicine

## 2017-05-11 VITALS — BP 130/80 | HR 86 | Temp 97.9°F

## 2017-05-11 DIAGNOSIS — I10 Essential (primary) hypertension: Secondary | ICD-10-CM

## 2017-05-11 DIAGNOSIS — R059 Cough, unspecified: Secondary | ICD-10-CM

## 2017-05-11 DIAGNOSIS — R05 Cough: Secondary | ICD-10-CM | POA: Diagnosis not present

## 2017-05-11 MED ORDER — LOSARTAN POTASSIUM 50 MG PO TABS: 50.0000 mg | ORAL_TABLET | Freq: Every day | ORAL | 3 refills | Status: DC

## 2017-05-11 NOTE — Patient Instructions (Signed)
Stop the Lisinopril  Start the Losartan in it's place. Touch base in one week if cough not improving.

## 2017-05-11 NOTE — Progress Notes (Signed)
Subjective:     Patient ID: Douglas Mann, male   DOB: 1946/01/26, 71 y.o.   MRN: [id number]  HPI Patient seen for follow-up regarding hypertension. He was here for recent Medicare wellness visit and had blood pressure 160/80. We started low-dose lisinopril 5 mg daily and blood pressure much improved today. However, he's had increased cough since starting this medication. He has chronic COPD and is on chronic 24/7 oxygen. No productive cough. No fevers or chills. No headaches. No chest pains. Renal function was stable on lab work  Past Medical History:  Diagnosis Date  . ALLERGIC RHINITIS 12/24/2009  . CAD 10/01/2009   Stent at [facility] greater than 10 years ago  . COPD 10/01/2009  . HYPERLIPIDEMIA 10/01/2009  . HYPERTENSION 10/01/2009  . 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 PVD 10/01/2009   Past Surgical History:  Procedure Laterality Date  . CARDIAC CATHETERIZATION  08/16/2014   Procedure: IABP INSERTION;  Surgeon: Leonie Man, MD;  Location: [facility] CATH LAB;  Service: Cardiovascular;;  . FRACTURE SURGERY  2012   ORIF r tibia fracture  . LEFT HEART CATH Bilateral 08/15/2014   Procedure: LEFT HEART CATH;  Surgeon: Leonie Man, MD;  Location: [facility] CATH LAB;  Service: Cardiovascular;  Laterality: Bilateral;  . PERCUTANEOUS CORONARY STENT INTERVENTION (PCI-S) N/A 08/16/2014   Procedure: PERCUTANEOUS CORONARY STENT INTERVENTION (PCI-S);  Surgeon: Leonie Man, MD;  Location: [facility] CATH LAB;  Service: Cardiovascular;  Laterality: N/A;  . TONSILLECTOMY AND ADENOIDECTOMY      reports that he has quit smoking. His smoking use included Cigarettes. He has a 75.00 pack-year smoking history. He has never used smokeless tobacco. He reports that he does not drink alcohol or use drugs. family history includes CAD in his father. No Known Allergies   Review of Systems  Constitutional: Negative for fatigue.  Eyes: Negative for visual disturbance.  Respiratory: Positive for cough and  wheezing. Negative for chest tightness and shortness of breath.   Cardiovascular: Negative for chest pain, palpitations and leg swelling.  Neurological: Negative for dizziness, syncope, weakness, light-headedness and headaches.       Objective:   Physical Exam  Constitutional: He is oriented to person, place, and time. He appears well-developed and well-nourished.  HENT:  Right Ear: External ear normal.  Left Ear: External ear normal.  Mouth/Throat: Oropharynx is clear and moist.  Eyes: Pupils are equal, round, and reactive to light.  Neck: Neck supple. No thyromegaly present.  Cardiovascular: Normal rate and regular rhythm.   Pulmonary/Chest: Effort normal. No respiratory distress. He has no rales.  Patient has some mild diffuse wheezes. Decreased breath throughout which is his baseline  Musculoskeletal: He exhibits no edema.  Neurological: He is alert and oriented to person, place, and time.       Assessment:     Hypertension improved. Unfortunately, increased cough which could be related to lisinopril    Plan:     -Discontinue lisinopril and start losartan 50 mg once daily -Monitor blood pressure closely -Routine follow-up within 3 months -Touch base in one week if cough not improving  [name] MD [facility]

## 2017-05-30 ENCOUNTER — Other Ambulatory Visit: Payer: Self-pay | Admitting: Family Medicine

## 2017-05-30 MED ORDER — LOSARTAN POTASSIUM 50 MG PO TABS: 50.0000 mg | ORAL_TABLET | Freq: Every day | ORAL | 3 refills | Status: DC

## 2017-05-30 NOTE — Telephone Encounter (Signed)
Originally sent to [facility].  Needs to go to [facility]

## 2017-06-08 ENCOUNTER — Other Ambulatory Visit: Payer: Self-pay | Admitting: Family Medicine

## 2017-06-08 MED ORDER — LISINOPRIL 5 MG PO TABS: 5.0000 mg | ORAL_TABLET | Freq: Every day | ORAL | 1 refills | Status: DC

## 2017-07-05 ENCOUNTER — Telehealth: Payer: Self-pay | Admitting: Family Medicine

## 2017-07-05 ENCOUNTER — Other Ambulatory Visit: Payer: Self-pay | Admitting: Family Medicine

## 2017-07-05 MED ORDER — FLUTICASONE-SALMETEROL 500-50 MCG/DOSE IN AEPB: 1.0000 | INHALATION_SPRAY | Freq: Two times a day (BID) | RESPIRATORY_TRACT | 3 refills | Status: DC

## 2017-07-05 NOTE — Telephone Encounter (Signed)
Pt need new Rx for fluticasone-salmeterol (ADVAIR) 500-50  #90  Pharm:  [place]

## 2017-07-05 NOTE — Telephone Encounter (Signed)
Below med was sent to local pharmacy and pt said med need to be sent to mail order. Please resend    Pt request refill of the following:  Fluticasone-Salmeterol (ADVAIR) 500-50 MCG/DOSE AEPB   Phamacy: [facility] mail order

## 2017-07-19 ENCOUNTER — Other Ambulatory Visit: Payer: Self-pay | Admitting: *Deleted

## 2017-07-19 MED ORDER — ALBUTEROL SULFATE HFA 108 (90 BASE) MCG/ACT IN AERS: 2.0000 | INHALATION_SPRAY | Freq: Four times a day (QID) | RESPIRATORY_TRACT | 3 refills | Status: DC | PRN

## 2017-08-29 ENCOUNTER — Observation Stay (HOSPITAL_BASED_OUTPATIENT_CLINIC_OR_DEPARTMENT_OTHER): Payer: Medicare HMO

## 2017-08-29 ENCOUNTER — Emergency Department (HOSPITAL_COMMUNITY): Payer: Medicare HMO

## 2017-08-29 ENCOUNTER — Encounter (HOSPITAL_COMMUNITY): Payer: Self-pay | Admitting: Emergency Medicine

## 2017-08-29 ENCOUNTER — Inpatient Hospital Stay (HOSPITAL_COMMUNITY)
Admission: EM | Admit: 2017-08-29 | Discharge: 2017-09-03 | DRG: 190 | Disposition: A | Discharge status: Home / Self Care | Payer: Medicare HMO | Attending: Internal Medicine | Admitting: Internal Medicine

## 2017-08-29 DIAGNOSIS — Z6824 Body mass index (BMI) 24.0-24.9, adult: Secondary | ICD-10-CM | POA: Diagnosis not present

## 2017-08-29 DIAGNOSIS — E039 Hypothyroidism, unspecified: Secondary | ICD-10-CM | POA: Diagnosis present

## 2017-08-29 DIAGNOSIS — I739 Peripheral vascular disease, unspecified: Secondary | ICD-10-CM | POA: Diagnosis present

## 2017-08-29 DIAGNOSIS — N400 Enlarged prostate without lower urinary tract symptoms: Secondary | ICD-10-CM | POA: Diagnosis not present

## 2017-08-29 DIAGNOSIS — Z7951 Long term (current) use of inhaled steroids: Secondary | ICD-10-CM

## 2017-08-29 DIAGNOSIS — E43 Unspecified severe protein-calorie malnutrition: Secondary | ICD-10-CM | POA: Diagnosis not present

## 2017-08-29 DIAGNOSIS — R7989 Other specified abnormal findings of blood chemistry: Secondary | ICD-10-CM | POA: Diagnosis present

## 2017-08-29 DIAGNOSIS — I10 Essential (primary) hypertension: Secondary | ICD-10-CM | POA: Diagnosis not present

## 2017-08-29 DIAGNOSIS — Z9119 Patient's noncompliance with other medical treatment and regimen: Secondary | ICD-10-CM | POA: Diagnosis not present

## 2017-08-29 DIAGNOSIS — J441 Chronic obstructive pulmonary disease with (acute) exacerbation: Secondary | ICD-10-CM | POA: Diagnosis not present

## 2017-08-29 DIAGNOSIS — K5900 Constipation, unspecified: Secondary | ICD-10-CM | POA: Diagnosis not present

## 2017-08-29 DIAGNOSIS — D649 Anemia, unspecified: Secondary | ICD-10-CM | POA: Diagnosis present

## 2017-08-29 DIAGNOSIS — T380X5A Adverse effect of glucocorticoids and synthetic analogues, initial encounter: Secondary | ICD-10-CM | POA: Diagnosis present

## 2017-08-29 DIAGNOSIS — I5043 Acute on chronic combined systolic (congestive) and diastolic (congestive) heart failure: Secondary | ICD-10-CM | POA: Diagnosis present

## 2017-08-29 DIAGNOSIS — D75839 Thrombocytosis, unspecified: Secondary | ICD-10-CM

## 2017-08-29 DIAGNOSIS — I130 Hypertensive heart and chronic kidney disease with heart failure and stage 1 through stage 4 chronic kidney disease, or unspecified chronic kidney disease: Secondary | ICD-10-CM | POA: Diagnosis present

## 2017-08-29 DIAGNOSIS — Z23 Encounter for immunization: Secondary | ICD-10-CM | POA: Diagnosis not present

## 2017-08-29 DIAGNOSIS — J449 Chronic obstructive pulmonary disease, unspecified: Secondary | ICD-10-CM | POA: Diagnosis present

## 2017-08-29 DIAGNOSIS — Z7982 Long term (current) use of aspirin: Secondary | ICD-10-CM

## 2017-08-29 DIAGNOSIS — N183 Chronic kidney disease, stage 3 unspecified: Secondary | ICD-10-CM | POA: Diagnosis present

## 2017-08-29 DIAGNOSIS — Z87891 Personal history of nicotine dependence: Secondary | ICD-10-CM | POA: Diagnosis not present

## 2017-08-29 DIAGNOSIS — I2510 Atherosclerotic heart disease of native coronary artery without angina pectoris: Secondary | ICD-10-CM | POA: Diagnosis not present

## 2017-08-29 DIAGNOSIS — I959 Hypotension, unspecified: Secondary | ICD-10-CM | POA: Diagnosis present

## 2017-08-29 DIAGNOSIS — Z9981 Dependence on supplemental oxygen: Secondary | ICD-10-CM | POA: Diagnosis not present

## 2017-08-29 DIAGNOSIS — R05 Cough: Secondary | ICD-10-CM | POA: Diagnosis present

## 2017-08-29 DIAGNOSIS — I5022 Chronic systolic (congestive) heart failure: Secondary | ICD-10-CM | POA: Diagnosis present

## 2017-08-29 DIAGNOSIS — I255 Ischemic cardiomyopathy: Secondary | ICD-10-CM | POA: Diagnosis not present

## 2017-08-29 DIAGNOSIS — D631 Anemia in chronic kidney disease: Secondary | ICD-10-CM | POA: Diagnosis present

## 2017-08-29 DIAGNOSIS — E785 Hyperlipidemia, unspecified: Secondary | ICD-10-CM | POA: Diagnosis present

## 2017-08-29 DIAGNOSIS — R0603 Acute respiratory distress: Secondary | ICD-10-CM | POA: Diagnosis not present

## 2017-08-29 DIAGNOSIS — R0600 Dyspnea, unspecified: Secondary | ICD-10-CM

## 2017-08-29 DIAGNOSIS — D473 Essential (hemorrhagic) thrombocythemia: Secondary | ICD-10-CM

## 2017-08-29 DIAGNOSIS — R059 Cough, unspecified: Secondary | ICD-10-CM | POA: Diagnosis present

## 2017-08-29 DIAGNOSIS — E871 Hypo-osmolality and hyponatremia: Secondary | ICD-10-CM | POA: Diagnosis present

## 2017-08-29 DIAGNOSIS — D72829 Elevated white blood cell count, unspecified: Secondary | ICD-10-CM | POA: Diagnosis present

## 2017-08-29 DIAGNOSIS — J9610 Chronic respiratory failure, unspecified whether with hypoxia or hypercapnia: Secondary | ICD-10-CM | POA: Diagnosis not present

## 2017-08-29 DIAGNOSIS — Z7902 Long term (current) use of antithrombotics/antiplatelets: Secondary | ICD-10-CM

## 2017-08-29 DIAGNOSIS — Z955 Presence of coronary angioplasty implant and graft: Secondary | ICD-10-CM

## 2017-08-29 DIAGNOSIS — Z79890 Hormone replacement therapy (postmenopausal): Secondary | ICD-10-CM

## 2017-08-29 DIAGNOSIS — I252 Old myocardial infarction: Secondary | ICD-10-CM

## 2017-08-29 DIAGNOSIS — R0602 Shortness of breath: Secondary | ICD-10-CM

## 2017-08-29 DIAGNOSIS — Z8249 Family history of ischemic heart disease and other diseases of the circulatory system: Secondary | ICD-10-CM

## 2017-08-29 DIAGNOSIS — R069 Unspecified abnormalities of breathing: Secondary | ICD-10-CM | POA: Diagnosis not present

## 2017-08-29 HISTORY — DX: Dependence on supplemental oxygen: Z99.81

## 2017-08-29 LAB — ECHOCARDIOGRAM COMPLETE
CHL CUP DOP CALC LVOT VTI: 22.3 cm
E decel time: 208 msec
FS: 16 % — AB (ref 28–44)
HEIGHTINCHES: 67 in
IVS/LV PW RATIO, ED: 0.83
LA ID, A-P, ES: 34 mm
LA diam end sys: 34 mm
LA vol index: 20.9 mL/m2
LADIAMINDEX: 1.87 cm/m2
LAVOL: 38.1 mL
LAVOLA4C: 41 mL
LV TDI E'LATERAL: 9.36
LV TDI E'MEDIAL: 5.98
LV dias vol index: 49 mL/m2
LV e' LATERAL: 9.36 cm/s
LV sys vol: 53 mL (ref 21–61)
LVDIAVOL: 89 mL (ref 62–150)
LVOT peak grad rest: 4 mmHg
LVOTPV: 103 cm/s
LVSYSVOLIN: 29 mL/m2
MV Dec: 208
MVPKEVEL: 0.9 m/s
PW: 12 mm — AB (ref 0.6–1.1)
Simpson's disk: 40
Stroke v: 35 ml
Weight: 2512 oz

## 2017-08-29 LAB — CBC WITH DIFFERENTIAL/PLATELET
BASOS ABS: 0 10*3/uL (ref 0.0–0.1)
Basophils Relative: 0 %
EOS PCT: 1 %
Eosinophils Absolute: 0.2 10*3/uL (ref 0.0–0.7)
HEMATOCRIT: 33.8 % — AB (ref 39.0–52.0)
HEMOGLOBIN: 11 g/dL — AB (ref 13.0–17.0)
LYMPHS ABS: 1.5 10*3/uL (ref 0.7–4.0)
Lymphocytes Relative: 8 %
MCH: 27 pg (ref 26.0–34.0)
MCHC: 32.5 g/dL (ref 30.0–36.0)
MCV: 82.8 fL (ref 78.0–100.0)
MONO ABS: 1.5 10*3/uL — AB (ref 0.1–1.0)
MONOS PCT: 8 %
NEUTROS PCT: 83 %
Neutro Abs: 15 10*3/uL — ABNORMAL HIGH (ref 1.7–7.7)
Platelets: 419 10*3/uL — ABNORMAL HIGH (ref 150–400)
RBC: 4.08 MIL/uL — ABNORMAL LOW (ref 4.22–5.81)
RDW: 15.9 % — AB (ref 11.5–15.5)
WBC: 18.2 10*3/uL — AB (ref 4.0–10.5)

## 2017-08-29 LAB — COMPREHENSIVE METABOLIC PANEL
ALT: 30 U/L (ref 17–63)
ANION GAP: 10 (ref 5–15)
AST: 33 U/L (ref 15–41)
Albumin: 2.3 g/dL — ABNORMAL LOW (ref 3.5–5.0)
Alkaline Phosphatase: 97 U/L (ref 38–126)
BILIRUBIN TOTAL: 0.7 mg/dL (ref 0.3–1.2)
BUN: 11 mg/dL (ref 6–20)
CHLORIDE: 84 mmol/L — AB (ref 101–111)
CO2: 30 mmol/L (ref 22–32)
Calcium: 8.4 mg/dL — ABNORMAL LOW (ref 8.9–10.3)
Creatinine, Ser: 1.05 mg/dL (ref 0.61–1.24)
Glucose, Bld: 111 mg/dL — ABNORMAL HIGH (ref 65–99)
POTASSIUM: 4.2 mmol/L (ref 3.5–5.1)
Sodium: 124 mmol/L — ABNORMAL LOW (ref 135–145)
TOTAL PROTEIN: 7.1 g/dL (ref 6.5–8.1)

## 2017-08-29 LAB — TSH: TSH: 0.335 u[IU]/mL — ABNORMAL LOW (ref 0.350–4.500)

## 2017-08-29 LAB — OSMOLALITY: OSMOLALITY: 271 mosm/kg — AB (ref 275–295)

## 2017-08-29 LAB — BRAIN NATRIURETIC PEPTIDE: B Natriuretic Peptide: 205.8 pg/mL — ABNORMAL HIGH (ref 0.0–100.0)

## 2017-08-29 LAB — TROPONIN I: Troponin I: 0.03 ng/mL (ref <0.03)

## 2017-08-29 MED ORDER — LEVOFLOXACIN 500 MG PO TABS
500.0000 mg | ORAL_TABLET | Freq: Every day | ORAL | Status: DC
  Administered 2017-08-29 – 2017-09-03 (×6): 500 mg via ORAL
  Filled 2017-08-29 (×7): qty 1

## 2017-08-29 MED ORDER — METHYLPREDNISOLONE SODIUM SUCC 125 MG IJ SOLR
60.0000 mg | Freq: Two times a day (BID) | INTRAMUSCULAR | Status: DC
  Administered 2017-08-29 – 2017-09-01 (×7): 60 mg via INTRAVENOUS
  Filled 2017-08-29 (×7): qty 2

## 2017-08-29 MED ORDER — ACETAMINOPHEN 325 MG PO TABS: 650.0000 mg | ORAL_TABLET | Freq: Four times a day (QID) | ORAL | Status: DC | PRN

## 2017-08-29 MED ORDER — AMIODARONE HCL 200 MG PO TABS
100.0000 mg | ORAL_TABLET | Freq: Every day | ORAL | Status: DC
  Administered 2017-08-29 – 2017-09-03 (×6): 100 mg via ORAL
  Filled 2017-08-29 (×6): qty 1

## 2017-08-29 MED ORDER — IPRATROPIUM-ALBUTEROL 0.5-2.5 (3) MG/3ML IN SOLN
3.0000 mL | Freq: Three times a day (TID) | RESPIRATORY_TRACT | Status: DC
  Administered 2017-08-30 – 2017-08-31 (×3): 3 mL via RESPIRATORY_TRACT
  Filled 2017-08-29 (×4): qty 3

## 2017-08-29 MED ORDER — ONDANSETRON HCL 4 MG/2ML IJ SOLN: 4.0000 mg | Freq: Four times a day (QID) | INTRAMUSCULAR | Status: DC | PRN

## 2017-08-29 MED ORDER — SODIUM CHLORIDE 0.9 % IV SOLN
INTRAVENOUS | Status: AC
  Administered 2017-08-29: 19:00:00 via INTRAVENOUS

## 2017-08-29 MED ORDER — LEVOTHYROXINE SODIUM 25 MCG PO TABS
25.0000 ug | ORAL_TABLET | Freq: Every day | ORAL | Status: DC
  Administered 2017-08-30 – 2017-09-03 (×5): 25 ug via ORAL
  Filled 2017-08-29 (×5): qty 1

## 2017-08-29 MED ORDER — TAMSULOSIN HCL 0.4 MG PO CAPS
0.4000 mg | ORAL_CAPSULE | Freq: Every day | ORAL | Status: DC
  Administered 2017-08-30 – 2017-09-03 (×6): 0.4 mg via ORAL
  Filled 2017-08-29 (×6): qty 1

## 2017-08-29 MED ORDER — OMEGA-3 FATTY ACIDS 1000 MG PO CAPS: 1.0000 g | ORAL_CAPSULE | Freq: Every day | ORAL | Status: DC

## 2017-08-29 MED ORDER — SODIUM CHLORIDE 0.9 % IV BOLUS (SEPSIS)
500.0000 mL | Freq: Once | INTRAVENOUS | Status: AC
  Administered 2017-08-29: 500 mL via INTRAVENOUS

## 2017-08-29 MED ORDER — RANOLAZINE ER 500 MG PO TB12
500.0000 mg | ORAL_TABLET | Freq: Two times a day (BID) | ORAL | Status: DC
  Administered 2017-08-30 – 2017-09-03 (×10): 500 mg via ORAL
  Filled 2017-08-29 (×10): qty 1

## 2017-08-29 MED ORDER — ALBUTEROL SULFATE (2.5 MG/3ML) 0.083% IN NEBU
5.0000 mg | INHALATION_SOLUTION | Freq: Once | RESPIRATORY_TRACT | Status: AC
  Administered 2017-08-29: 5 mg via RESPIRATORY_TRACT
  Filled 2017-08-29: qty 6

## 2017-08-29 MED ORDER — ALBUTEROL SULFATE (2.5 MG/3ML) 0.083% IN NEBU
2.5000 mg | INHALATION_SOLUTION | RESPIRATORY_TRACT | Status: DC
  Filled 2017-08-29 (×2): qty 3

## 2017-08-29 MED ORDER — ONDANSETRON HCL 4 MG PO TABS: 4.0000 mg | ORAL_TABLET | Freq: Four times a day (QID) | ORAL | Status: DC | PRN

## 2017-08-29 MED ORDER — ATORVASTATIN CALCIUM 40 MG PO TABS
40.0000 mg | ORAL_TABLET | Freq: Every day | ORAL | Status: DC
  Administered 2017-08-30 – 2017-09-03 (×5): 40 mg via ORAL
  Filled 2017-08-29 (×6): qty 1

## 2017-08-29 MED ORDER — ALBUTEROL SULFATE (2.5 MG/3ML) 0.083% IN NEBU: 2.5000 mg | INHALATION_SOLUTION | RESPIRATORY_TRACT | Status: DC | PRN

## 2017-08-29 MED ORDER — HYDROCORTISONE 2.5 % RE CREA: TOPICAL_CREAM | Freq: Two times a day (BID) | RECTAL | Status: DC | PRN

## 2017-08-29 MED ORDER — PREDNISONE 20 MG PO TABS
60.0000 mg | ORAL_TABLET | Freq: Once | ORAL | Status: AC
  Administered 2017-08-29: 60 mg via ORAL
  Filled 2017-08-29: qty 3

## 2017-08-29 MED ORDER — GUAIFENESIN ER 600 MG PO TB12
600.0000 mg | ORAL_TABLET | Freq: Two times a day (BID) | ORAL | Status: DC
  Administered 2017-08-29 – 2017-08-31 (×4): 600 mg via ORAL
  Filled 2017-08-29 (×4): qty 1

## 2017-08-29 MED ORDER — CARVEDILOL 3.125 MG PO TABS
3.1250 mg | ORAL_TABLET | Freq: Two times a day (BID) | ORAL | Status: DC
  Administered 2017-08-30 – 2017-09-03 (×10): 3.125 mg via ORAL
  Filled 2017-08-29 (×10): qty 1

## 2017-08-29 MED ORDER — IPRATROPIUM BROMIDE 0.02 % IN SOLN
0.5000 mg | RESPIRATORY_TRACT | Status: DC
  Filled 2017-08-29: qty 2.5

## 2017-08-29 MED ORDER — ASPIRIN 81 MG PO TABS: 81.0000 mg | ORAL_TABLET | Freq: Every day | ORAL | Status: DC | PRN

## 2017-08-29 MED ORDER — ACETAMINOPHEN 650 MG RE SUPP: 650.0000 mg | Freq: Four times a day (QID) | RECTAL | Status: DC | PRN

## 2017-08-29 MED ORDER — CLOPIDOGREL BISULFATE 75 MG PO TABS
75.0000 mg | ORAL_TABLET | Freq: Every day | ORAL | Status: DC
  Administered 2017-08-30 – 2017-09-03 (×6): 75 mg via ORAL
  Filled 2017-08-29 (×6): qty 1

## 2017-08-29 MED ORDER — MOMETASONE FURO-FORMOTEROL FUM 200-5 MCG/ACT IN AERO
2.0000 | INHALATION_SPRAY | Freq: Two times a day (BID) | RESPIRATORY_TRACT | Status: DC
  Filled 2017-08-29: qty 8.8

## 2017-08-29 NOTE — ED Provider Notes (Signed)
  Face-to-face evaluation   History:  Increasing weakness. Cough with yellow sputum. No fever. Unable to walk around home. Physical exam: Elderly, alert, calm. No respiratory distress. Lungs with poor air met, tachypnea and expiratory wheezing. Legs- no edema.    Medical screening examination/treatment/procedure(s) were conducted as a shared visit with non-physician practitioner(s) and myself.  I personally evaluated the patient during the encounter    Daleen Bo, MD 08/30/17 [phone]

## 2017-08-29 NOTE — H&P (Signed)
History and Physical    Douglas Mann QQP:[id number] DOB: 02-Mar-1946 DOA: 08/29/2017  PCP: [name], MD Patient coming from: home  Chief Complaint: worsening sob  HPI: Douglas Mann is a very pleasant 71 y.o. male with medical history significant for COPD on 4 L at home, hypertension, chronic kidney disease, MI, CHF, hypothyroidism, since emergency Department chief complaint worsening shortness of breath. She evaluation reveals respiratory distress likely related to COPD exacerbation  Information is obtained from the patient and his daughter who is at the bedside. He states for the last 4-5 days he's experienced gradual worsening of his shortness of breath. He states his baseline is mild to moderate shortness of breath with exertion. Over the last 4-5 days he has experienced shortness of breath at rest. Associated symptoms include subjective fever chills increased cough with small amount of thick white sputum. He also has intermittent nausea without vomiting decreased appetite intermittent bouts of diarrhea. He states he takes a laxative because his tendency is to become constipated. He denies chest pain palpitations diaphoresis lower extremity edema. He states he's been prescribed nebulizers but he refuses to use them at home. He states he is on a no sodium diet from which he is compliant. Are states he takes very little food or liquid    ED Course: In the emergency department is afebrile hemodynamically stable and not hypoxic. Provided with 1 nebulizer treatment 60 mg of prednisone and 500 mL normal saline  Review of Systems: As per HPI otherwise all other systems reviewed and are negative.   Ambulatory Status: Patient lives alone and has a very unsteady gait needs a walker but refuses to use it frequent falls  Past Medical History:  Diagnosis Date  . ALLERGIC RHINITIS 12/24/2009  . CAD 10/01/2009   Stent at [place] greater than 10 years ago  . COPD 10/01/2009  . HYPERLIPIDEMIA  10/01/2009  . HYPERTENSION 10/01/2009  . 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 PVD 10/01/2009    Past Surgical History:  Procedure Laterality Date  . CARDIAC CATHETERIZATION  08/16/2014   Procedure: IABP INSERTION;  Surgeon: Leonie Man, MD;  Location: [facility] CATH LAB;  Service: Cardiovascular;;  . FRACTURE SURGERY  2012   ORIF r tibia fracture  . LEFT HEART CATH Bilateral 08/15/2014   Procedure: LEFT HEART CATH;  Surgeon: Leonie Man, MD;  Location: [facility] CATH LAB;  Service: Cardiovascular;  Laterality: Bilateral;  . PERCUTANEOUS CORONARY STENT INTERVENTION (PCI-S) N/A 08/16/2014   Procedure: PERCUTANEOUS CORONARY STENT INTERVENTION (PCI-S);  Surgeon: Leonie Man, MD;  Location: [facility] CATH LAB;  Service: Cardiovascular;  Laterality: N/A;  . TONSILLECTOMY AND ADENOIDECTOMY      Social History   Social History  . Marital status: Single    Spouse name: N/A  . Number of children: 2  . Years of education: N/A   Occupational History  . Retired Disabled   Social History Main Topics  . Smoking status: Former Smoker    Packs/day: 1.50    Years: 50.00    Types: Cigarettes  . Smokeless tobacco: Never Used     Comment: quit  about 2 years ago  . Alcohol use No  . Drug use: No  . Sexual activity: Not on file   Other Topics Concern  . Not on file   Social History Narrative   Lives alone.      No Known Allergies  Family History  Problem Relation Age of Onset  . CAD Father  Died age 60 MI    Prior to Admission medications   Medication Sig Start Date End Date Taking? Authorizing Provider  albuterol (VENTOLIN HFA) 108 (90 Base) MCG/ACT inhaler Inhale 2 puffs into the lungs every 6 (six) hours as needed. 07/19/17  Yes Burchette, Alinda Sierras, MD  aspirin 81 MG tablet Take 81 mg by mouth daily as needed for pain.    Yes [provider]  Fluticasone-Salmeterol (ADVAIR) 500-50 MCG/DOSE AEPB Inhale 1 puff into the lungs 2 (two) times daily. 07/05/17   Yes Burchette, Alinda Sierras, MD  amiodarone (PACERONE) 200 MG tablet TAKE 1/2 TABLET EVERY DAY 01/10/17   Larey Dresser, MD  atorvastatin (LIPITOR) 40 MG tablet Take 1 tablet (40 mg total) by mouth daily. 03/11/17   Burchette, Alinda Sierras, MD  carvedilol (COREG) 3.125 MG tablet Take 1 tablet (3.125 mg total) by mouth 2 (two) times daily. 03/11/17   Burchette, Alinda Sierras, MD  clopidogrel (PLAVIX) 75 MG tablet Take 1 tablet (75 mg total) by mouth daily. 03/11/17   Burchette, Alinda Sierras, MD  fish oil-omega-3 fatty acids 1000 MG capsule Take 1 g by mouth daily.     [provider]  furosemide (LASIX) 40 MG tablet Take 1 tablet (40 mg total) by mouth as needed. 01/06/16   Bensimhon, Shaune Pascal, MD  HYDROcodone-acetaminophen (NORCO) 7.5-325 MG tablet Take 1 tablet by mouth every 4 (four) hours as needed for moderate pain. 04/20/17   Burchette, Alinda Sierras, MD  hydrocortisone (ANUSOL-HC) 2.5 % rectal cream apply rectally twice a day if needed for HEMORRHOIDS or itching 12/07/16   Burchette, Alinda Sierras, MD  levothyroxine (SYNTHROID, LEVOTHROID) 25 MCG tablet TAKE 1 TABLET ONE TIME DAILY (NEED MD APPOINTMENT) 04/27/17   Burchette, Alinda Sierras, MD  lisinopril (PRINIVIL,ZESTRIL) 5 MG tablet Take 1 tablet (5 mg total) by mouth daily. 06/08/17   Burchette, Alinda Sierras, MD  losartan (COZAAR) 50 MG tablet Take 1 tablet (50 mg total) by mouth daily. 05/30/17   Burchette, Alinda Sierras, MD  RANEXA 500 MG 12 hr tablet TAKE 1 TABLET TWICE DAILY 01/10/17   Larey Dresser, MD  tamsulosin [facility]) 0.4 MG CAPS capsule TAKE 1 CAPSULE EVERY DAY 03/14/17   Burchette, Alinda Sierras, MD  triamcinolone cream (KENALOG) 0.1 % Apply 1 application topically 2 (two) times daily as needed. Compound 1:1 with Eucerin and apply to affected skin bid prn 04/20/17   [name], MD    Physical Exam: Vitals:   08/29/17 1400 08/29/17 1530 08/29/17 1600 08/29/17 1700  BP: 126/73 (!) 149/79 136/82 (!) 154/72  Pulse: 83 84 87 84  Resp: (!) 22 19 17  (!) 23  Temp:        TempSrc:      SpO2: 91% 96% 93% 98%  Weight:      Height:         General:  Appears calm and comfortable Sitting up in bed in no acute distress Eyes:  PERRL, EOMI, normal lids, iris ENT:  grossly normal hearing, lips & tongue, mucous membranes of his mouth are moist and pink, very poor dentition Neck:  no LAD, masses or thyromegaly Cardiovascular:  RRR, no m/r/g. No LE edema. Pedal pulses present and palpable Respiratory:  Mild increased work of breathing with conversation. Patient is able to complete sentences. Breath sounds are quite diminished throughout. Some rhonchi very faint end expiratory wheezing no crackles Abdomen:  soft, ntnd, positive bowel sounds throughout no guarding or rebounding Skin:  no rash or  induration seen on limited exam Musculoskeletal:  grossly normal tone BUE/BLE, good ROM, no bony abnormality Psychiatric:  grossly normal mood and affect, speech fluent and appropriate, AOx3 Neurologic:  CN 2-12 grossly intact, moves all extremities in coordinated fashion, sensation intact  Labs on Admission: I have personally reviewed following labs and imaging studies  CBC:  Recent Labs Lab 08/29/17 1400  WBC 18.2*  NEUTROABS 15.0*  HGB 11.0*  HCT 33.8*  MCV 82.8  PLT 237*   Basic Metabolic Panel:  Recent Labs Lab 08/29/17 1400  NA 124*  K 4.2  CL 84*  CO2 30  GLUCOSE 111*  BUN 11  CREATININE 1.05  CALCIUM 8.4*   GFR: Estimated Creatinine Clearance: 58.6 mL/min (by C-G formula based on SCr of 1.05 mg/dL). Liver Function Tests:  Recent Labs Lab 08/29/17 1400  AST 33  ALT 30  ALKPHOS 97  BILITOT 0.7  PROT 7.1  ALBUMIN 2.3*   No results for input(s): LIPASE, AMYLASE in the last 168 hours. No results for input(s): AMMONIA in the last 168 hours. Coagulation Profile: No results for input(s): INR, PROTIME in the last 168 hours. Cardiac Enzymes: No results for input(s): CKTOTAL, CKMB, CKMBINDEX, TROPONINI in the last 168 hours. BNP (last 3  results) No results for input(s): PROBNP in the last 8760 hours. HbA1C: No results for input(s): HGBA1C in the last 72 hours. CBG: No results for input(s): GLUCAP in the last 168 hours. Lipid Profile: No results for input(s): CHOL, HDL, LDLCALC, TRIG, CHOLHDL, LDLDIRECT in the last 72 hours. Thyroid Function Tests: No results for input(s): TSH, T4TOTAL, FREET4, T3FREE, THYROIDAB in the last 72 hours. Anemia Panel: No results for input(s): VITAMINB12, FOLATE, FERRITIN, TIBC, IRON, RETICCTPCT in the last 72 hours. Urine analysis:    Component Value Date/Time   COLORURINE yellow 12/23/2010 1050   APPEARANCEUR Clear 12/23/2010 1050   LABSPEC 1.015 12/23/2010 1050   PHURINE 7.5 12/23/2010 1050   HGBUR negative 12/23/2010 1050   BILIRUBINUR negative 12/23/2010 1050   UROBILINOGEN 0.2 12/23/2010 1050   NITRITE negative 12/23/2010 1050    Creatinine Clearance: Estimated Creatinine Clearance: 58.6 mL/min (by C-G formula based on SCr of 1.05 mg/dL).  Sepsis Labs: @LABRCNTIP (procalcitonin:4,lacticidven:4) )No results found for this or any previous visit (from the past 240 hour(s)).   Radiological Exams on Admission: Dg Chest 2 View  Result Date: 08/29/2017 CLINICAL DATA:  Dyspnea and productive cough EXAM: CHEST  2 VIEW COMPARISON:  08/23/2014 FINDINGS: Heart is top-normal in size. No aortic aneurysm. Mild emphysematous hyperinflation of the lungs bilaterally with minimal atelectasis at each lung base left slightly greater than right. No pneumonic consolidation or overt pulmonary edema. Minimal blunting of the posterior costophrenic angles bilaterally consistent with trace pleural effusions, pleural blunting due to hyperinflated lungs or pleural thickening. No acute nor suspicious osseous abnormality. IMPRESSION: 1. Emphysematous hyperinflation of the lungs. 2. Slight blunting of the posterior costophrenic angles may reflect pleural blunting due to hyperinflated lungs, trace effusions with  minimal pleural thickening. Electronically Signed   By: Ashley Royalty M.D.   On: 08/29/2017 15:19    EKG: Independently reviewed. 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 EF 5 -10% by LV gram - following non-STEMI and ventricular tachycardia   Anemia, unspecified  Chronic systolic heart failure (HCC)   CKD (chronic kidney disease), stage III   Hypothyroidism   Benign prostatic hyperplasia   Hyponatremia   Acute respiratory distress   Leukocytosis   Constipation   #1. Respiratory distress in the setting of chronic respiratory failure likely related to COPD exacerbation in setting of CHF continued tobacco use With increased oxygen demand. Short of breath at rest. Oxygen saturation level greater than 90% on 4 L. Chest x-ray without consolidation. He is afebrile he does have a leukocytosis nontoxic appearing -Admit to medical floor -Scheduled nebulizers -Cycle troponins -Solu-Medrol -Flutter valve -Antitussives -Continue home Advair  #2. Hyponatremia. Sodium level 124 on admission. Etiology unclear. Doesn't look particularly dehydrated or overloaded. May be related to medications in setting of decreased oral intake. He is also been feeling constipated so he was taken laxatives had several loose stools of late. He was given 500 mL of normal saline in the emergency department -Gentle IV fluids -Hold Lasix -Serum osmolality -Urine osmolality and urine sodium -Recheck in the morning  #3. Chronic systolic heart failure. Last echo 2015 with an EF of 15%. Appears compensated Home medications include Lasix Coreg lisinopril losartan amiodarone.  -Hold Lasix, Cozaar and lisinopril due to  blood pressure low end of normal -Continue Coreg and amiodarone -Obtain a BNP -Daily weights -Intake and output -Echocardiogram  4. Hypertension. Blood pressure on the low end of normal. Will continue his Coreg -Hold other medications as noted above -Monitor closely and resume inhaled meds as indicated  #5. Chronic kidney disease. Stage II. Creatinine 1.05 on admission -Gentle IV fluids -Hold nephrotoxins -Monitor urine output -Recheck in the morning  #6. CAD. Cardiac cath 2015. Status post stent at [facility]. Home medications include aspirin Plavix statin. No chest pain. EKG as noted above. -Cycle troponin -Continue home meds    DVT prophylaxis: plavix  Code Status: full  Family Communication: daughter at bedside  Disposition Plan: home  Consults called: none  Admission status: obs    Dyanne Carrel M MD [facility]  If 7PM-7AM, please contact night-coverage [email or web address] Password [email or web address]  08/29/2017, 5:17 PM

## 2017-08-29 NOTE — Progress Notes (Signed)
Results for TRAJAN, GROVE (MRN [id number]) as of 08/29/2017 20:18  Ref. Range 08/29/2017 18:33  Troponin I Latest Ref Range: <0.03 ng/mL 0.03 (HH)  MD on call Made aware.

## 2017-08-29 NOTE — ED Triage Notes (Signed)
Per EMS, patient is coming from home with c/o SOB and productive cough.  Yellow sputum.  Has had symptoms of pneumonia x 5 days. Afebrile.  Wheezing bilaterally.  Refused albuterol en route.  4L of O2 at home.  Unknown baseline of oxygen saturation.  153/79, HR 86, RR 18, 90-91% 4L. Hx of COPD.

## 2017-08-29 NOTE — ED Notes (Signed)
Pt having ECHO done at this time in A8.  Can not be transported at this time.

## 2017-08-29 NOTE — Progress Notes (Signed)
  Echocardiogram 2D Echocardiogram has been performed.  Douglas Mann 08/29/2017, 6:15 PM

## 2017-08-29 NOTE — ED Provider Notes (Signed)
[place] [facility] DEPT Provider Note   CSN: [id number] Arrival date & time: 08/29/17  1323     History   Chief Complaint Chief Complaint  Patient presents with  . Shortness of Breath    HPI Douglas Mann is a 71 y.o. male w PMHx COPD on 4L, HLD, HTN, CKD, MI, NSTEMI, presenting to the ED for gradually worsening productive cough of yellow sputum. Pt states cough has been worsening for 5 days, with increasing SOB from baseline, subjective fever, nausea, decreased appetite, and nasal congestion. He states he lives with a friend who helps him around the house. His family reports he is having great difficulty ambulating on his own 2/t weakness and SOB on exertion.    The history is provided by the patient.    Past Medical History:  Diagnosis Date  . ALLERGIC RHINITIS 12/24/2009  . CAD 10/01/2009   Stent at [facility] greater than 10 years ago  . COPD 10/01/2009  . HYPERLIPIDEMIA 10/01/2009  . HYPERTENSION 10/01/2009  . 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 PVD 10/01/2009    Patient Active Problem List   Diagnosis Date Noted  . Respiratory distress 08/29/2017  . Benign prostatic hyperplasia 03/02/2016  . Hypothyroidism 10/16/2015  . Urinary hesitancy 10/14/2015  . CKD (chronic kidney disease), stage III 09/25/2014  . Chronic systolic heart failure ([place]) 09/13/2014  . Hypotension due to drugs 09/10/2014  . Anemia, unspecified 08/26/2014  . AKI (acute kidney injury) ([place]) 08/19/2014  . Atherosclerotic heart disease of native coronary artery with unstable angina pectoris: Chronic percent RCA with left to right collaterals; 90% proximal LAD. 08/16/2014  . Acute combined systolic and diastolic HF (heart failure), NYHA class 4: In setting of non-STEMI; LVEDP 45 mmHg 08/16/2014    Class: Acute  . Cardiomyopathy, ischemic: Severe. EF 5 -10% by LV gram - following non-STEMI and ventricular tachycardia 08/16/2014  . MI, acute, non ST segment elevation ([place]) 08/15/2014   Class: Acute  . Acute pulmonary edema ([place]) 08/15/2014  . Acute respiratory failure with hypoxia ([place]) 08/15/2014  . Cardiogenic shock: Following nonSTEMI 08/15/2014    Class: Acute  . GI bleed 08/13/2014  . WEIGHT LOSS 12/23/2010  . COUGH, CHRONIC 01/28/2010  . ALLERGIC RHINITIS 12/24/2009  . 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 Hyperlipidemia 10/01/2009  . Essential hypertension 10/01/2009  . Coronary atherosclerosis 10/01/2009  . Peripheral arterial occlusive disease: 100% occluded right common iliac; focal 90 and diffuse 60-70% left common and external iliac 10/01/2009    Class: Diagnosis of  . COPD, severe: On chronic home O2 10/01/2009    Class: Diagnosis of    Past Surgical History:  Procedure Laterality Date  . CARDIAC CATHETERIZATION  08/16/2014   Procedure: IABP INSERTION;  Surgeon: Leonie Man, MD;  Location: [facility] C[facility] LAB;  Service: Cardiovascular;;  . FRACTURE SURGERY  2012   ORIF r tibia fracture  . LEFT HEART CATH Bilateral 08/15/2014   Procedure: LEFT HEART CATH;  Surgeon: Leonie Man, MD;  Location: [facility] CATH LAB;  Service: Cardiovascular;  Laterality: Bilateral;  . PERCUTANEOUS CORONARY STENT INTERVENTION (PCI-S) N/A 08/16/2014   Procedure: PERCUTANEOUS CORONARY STENT INTERVENTION (PCI-S);  Surgeon: Leonie Man, MD;  Location: [facility] CATH LAB;  Service: Cardiovascular;  Laterality: N/A;  . TONSILLECTOMY AND ADENOIDECTOMY         Home Medications    Prior to Admission medications   Medication Sig Start Date End Date Taking? Authorizing Provider  albuterol (VENTOLIN HFA) 108 (90 Base) MCG/ACT inhaler Inhale  2 puffs into the lungs every 6 (six) hours as needed. 07/19/17  Yes Burchette, Alinda Sierras, MD  aspirin 81 MG tablet Take 81 mg by mouth daily as needed for pain.    Yes [provider]  Fluticasone-Salmeterol (ADVAIR) 500-50 MCG/DOSE AEPB Inhale 1 puff into the lungs 2 (two) times daily. 07/05/17  Yes Burchette, Alinda Sierras, MD    amiodarone (PACERONE) 200 MG tablet TAKE 1/2 TABLET EVERY DAY 01/10/17   Larey Dresser, MD  atorvastatin (LIPITOR) 40 MG tablet Take 1 tablet (40 mg total) by mouth daily. 03/11/17   Burchette, Alinda Sierras, MD  carvedilol (COREG) 3.125 MG tablet Take 1 tablet (3.125 mg total) by mouth 2 (two) times daily. 03/11/17   Burchette, Alinda Sierras, MD  clopidogrel (PLAVIX) 75 MG tablet Take 1 tablet (75 mg total) by mouth daily. 03/11/17   Burchette, Alinda Sierras, MD  fish oil-omega-3 fatty acids 1000 MG capsule Take 1 g by mouth daily.     [provider]  furosemide (LASIX) 40 MG tablet Take 1 tablet (40 mg total) by mouth as needed. 01/06/16   Bensimhon, Shaune Pascal, MD  HYDROcodone-acetaminophen (NORCO) 7.5-325 MG tablet Take 1 tablet by mouth every 4 (four) hours as needed for moderate pain. 04/20/17   Burchette, Alinda Sierras, MD  hydrocortisone (ANUSOL-HC) 2.5 % rectal cream apply rectally twice a day if needed for HEMORRHOIDS or itching 12/07/16   Burchette, Alinda Sierras, MD  levothyroxine (SYNTHROID, LEVOTHROID) 25 MCG tablet TAKE 1 TABLET ONE TIME DAILY (NEED MD APPOINTMENT) 04/27/17   Burchette, Alinda Sierras, MD  lisinopril (PRINIVIL,ZESTRIL) 5 MG tablet Take 1 tablet (5 mg total) by mouth daily. 06/08/17   Burchette, Alinda Sierras, MD  losartan (COZAAR) 50 MG tablet Take 1 tablet (50 mg total) by mouth daily. 05/30/17   Burchette, Alinda Sierras, MD  RANEXA 500 MG 12 hr tablet TAKE 1 TABLET TWICE DAILY 01/10/17   Larey Dresser, MD  tamsulosin [facility]) 0.4 MG CAPS capsule TAKE 1 CAPSULE EVERY DAY 03/14/17   Burchette, Alinda Sierras, MD  triamcinolone cream (KENALOG) 0.1 % Apply 1 application topically 2 (two) times daily as needed. Compound 1:1 with Eucerin and apply to affected skin bid prn 04/20/17   Burchette, Alinda Sierras, MD    Family History Family History  Problem Relation Age of Onset  . CAD Father        Died age 87 MI    Social History Social History  Substance Use Topics  . Smoking status: Former Smoker    Packs/day: 1.50     Years: 50.00    Types: Cigarettes  . Smokeless tobacco: Never Used     Comment: quit  about 2 years ago  . Alcohol use No     Allergies   Patient has no known allergies.   Review of Systems Review of Systems  Constitutional: Positive for appetite change and fever. Negative for chills.  HENT: Positive for congestion. Negative for sore throat and trouble swallowing.   Respiratory: Positive for cough and shortness of breath.   Cardiovascular: Negative for chest pain.  Gastrointestinal: Positive for nausea. Negative for abdominal pain, constipation, diarrhea and vomiting.  Neurological: Negative for headaches.  All other systems reviewed and are negative.    Physical Exam Updated Vital Signs BP 126/73   Pulse 83   Temp 98.5 F (36.9 C) (Oral)   Resp (!) 22   Ht 5\' 7"  (1.702 m)   Wt 71.2 kg (157 lb)   SpO2  91%   BMI 24.59 kg/m   Physical Exam  Constitutional: He is oriented to person, place, and time. He appears well-developed and well-nourished. No distress.  Chronically ill-appearing male  HENT:  Head: Normocephalic and atraumatic.  Mouth/Throat: Oropharynx is clear and moist.  Eyes: Conjunctivae are normal.  Neck: Normal range of motion. Neck supple. No tracheal deviation present.  Cardiovascular: Normal rate, regular rhythm, normal heart sounds and intact distal pulses.  Exam reveals no friction rub.   No murmur heard. Pulmonary/Chest: Effort normal. No accessory muscle usage or stridor. He has wheezes. He has no rales. He exhibits no tenderness.  Decreased lung sounds throughout, with expiratory wheezing noted bilaterally.  Abdominal: Soft. Bowel sounds are normal. He exhibits no distension. There is no tenderness. There is no rebound and no guarding.  Lymphadenopathy:    He has no cervical adenopathy.  Neurological: He is alert and oriented to person, place, and time.  Skin: Skin is warm.  Psychiatric: He has a normal mood and affect. His behavior is normal.    Nursing note and vitals reviewed.    ED Treatments / Results  Labs (all labs ordered are listed, but only abnormal results are displayed) Labs Reviewed  COMPREHENSIVE METABOLIC PANEL - Abnormal; Notable for the following:       Result Value   Sodium 124 (*)    Chloride 84 (*)    Glucose, Bld 111 (*)    Calcium 8.4 (*)    Albumin 2.3 (*)    All other components within normal limits  CBC WITH DIFFERENTIAL/PLATELET - Abnormal; Notable for the following:    WBC 18.2 (*)    RBC 4.08 (*)    Hemoglobin 11.0 (*)    HCT 33.8 (*)    RDW 15.9 (*)    Platelets 419 (*)    Neutro Abs 15.0 (*)    Monocytes Absolute 1.5 (*)    All other components within normal limits    EKG  EKG Interpretation  Date/Time:  Monday August 29 2017 13:27:49 EDT Ventricular Rate:  87 PR Interval:    QRS Duration: 119 QT Interval:  415 QTC Calculation: 500 R Axis:   88 Text Interpretation:  Sinus rhythm Nonspecific intraventricular conduction delay Inferolateral infarct, old Baseline wander since last tracing no significant change Confirmed by Daleen Bo [phone]) on 08/29/2017 4:10:13 PM       Radiology Dg Chest 2 View  Result Date: 08/29/2017 CLINICAL DATA:  Dyspnea and productive cough EXAM: CHEST  2 VIEW COMPARISON:  08/23/2014 FINDINGS: Heart is top-normal in size. No aortic aneurysm. Mild emphysematous hyperinflation of the lungs bilaterally with minimal atelectasis at each lung base left slightly greater than right. No pneumonic consolidation or overt pulmonary edema. Minimal blunting of the posterior costophrenic angles bilaterally consistent with trace pleural effusions, pleural blunting due to hyperinflated lungs or pleural thickening. No acute nor suspicious osseous abnormality. IMPRESSION: 1. Emphysematous hyperinflation of the lungs. 2. Slight blunting of the posterior costophrenic angles may reflect pleural blunting due to hyperinflated lungs, trace effusions with minimal pleural  thickening. Electronically Signed   By: Ashley Royalty M.D.   On: 08/29/2017 15:19    Procedures Procedures (including critical care time)  Medications Ordered in ED Medications  sodium chloride 0.9 % bolus 500 mL (not administered)  predniSONE (DELTASONE) tablet 60 mg (not administered)  albuterol (PROVENTIL) (2.5 MG/3ML) 0.083% nebulizer solution 5 mg (not administered)     Initial Impression / Assessment and Plan / ED Course  I  have reviewed the triage vital signs and the nursing notes.  Pertinent labs & imaging results that were available during my care of the patient were reviewed by me and considered in my medical decision making (see chart for details).  Clinical Course as of Aug 29 1626  Mon Aug 29, 2017  1625 Spoke with Dyanne Carrel, with [facility] accepting admission.  [JR]    Clinical Course User Index [JR] Russo, [place] N, PA-C    Pt with multiple comorbidities presenting with worsening SOB and productive cough. On 4L chronically for COPD. Pt with worsening SOB on exertion, required multiple person assist per family members. Pt tachypneic in ED, with decreased air movement and wheezing. CXR without consolidation.  WBC elevated to 18.2 with left shift, however not on chronic steroids. Pt hyponatremic at 124. Gently replaced fluids, with regards to patient's CHF. PO Prednisone given and albuterol neb. Consult to [facility] for admission for COPD exacerbation, hyponatremia, and weakness. Spoke with Dyanne Carrel, who is accepting admission.  Patient discussed with and seen by Dr. Eulis Foster, who agrees with care plan.  Final Clinical Impressions(s) / ED Diagnoses   Final diagnoses:  COPD exacerbation ([place])  Hyponatremia    New Prescriptions New Prescriptions   No medications on file     Russo, [place] N, PA-C 08/29/17 1627    Daleen Bo, MD 08/30/17 [phone]

## 2017-08-29 NOTE — Progress Notes (Signed)
New Admission Note:   Arrival Method: stretcher from ED Mental Orientation: alert and oriented x 4 Telemetry:none  Assessment: Completed Skin: see flow sheet  IV:R AC   Pain: denies  Tubes: none  Safety Measures: Safety Fall Prevention Plan has been given, discussed and signed Admission: Completed [facility] Orientation: Patient has been orientated to the room, unit and staff.  Family: none at bedside   Orders have been reviewed and implemented. Will continue to monitor the patient. Call light has been placed within reach and bed alarm has been activated.   Emilio Math, RN [facility]  Phone number: [phone]

## 2017-08-30 DIAGNOSIS — R05 Cough: Secondary | ICD-10-CM | POA: Diagnosis not present

## 2017-08-30 DIAGNOSIS — D72829 Elevated white blood cell count, unspecified: Secondary | ICD-10-CM | POA: Diagnosis present

## 2017-08-30 DIAGNOSIS — J9610 Chronic respiratory failure, unspecified whether with hypoxia or hypercapnia: Secondary | ICD-10-CM | POA: Diagnosis present

## 2017-08-30 DIAGNOSIS — I5043 Acute on chronic combined systolic (congestive) and diastolic (congestive) heart failure: Secondary | ICD-10-CM | POA: Diagnosis present

## 2017-08-30 DIAGNOSIS — I739 Peripheral vascular disease, unspecified: Secondary | ICD-10-CM | POA: Diagnosis present

## 2017-08-30 DIAGNOSIS — I2510 Atherosclerotic heart disease of native coronary artery without angina pectoris: Secondary | ICD-10-CM | POA: Diagnosis present

## 2017-08-30 DIAGNOSIS — J449 Chronic obstructive pulmonary disease, unspecified: Secondary | ICD-10-CM | POA: Diagnosis not present

## 2017-08-30 DIAGNOSIS — E871 Hypo-osmolality and hyponatremia: Secondary | ICD-10-CM | POA: Diagnosis present

## 2017-08-30 DIAGNOSIS — Z6824 Body mass index (BMI) 24.0-24.9, adult: Secondary | ICD-10-CM | POA: Diagnosis not present

## 2017-08-30 DIAGNOSIS — I5022 Chronic systolic (congestive) heart failure: Secondary | ICD-10-CM | POA: Diagnosis not present

## 2017-08-30 DIAGNOSIS — T380X5A Adverse effect of glucocorticoids and synthetic analogues, initial encounter: Secondary | ICD-10-CM | POA: Diagnosis present

## 2017-08-30 DIAGNOSIS — N400 Enlarged prostate without lower urinary tract symptoms: Secondary | ICD-10-CM | POA: Diagnosis present

## 2017-08-30 DIAGNOSIS — J441 Chronic obstructive pulmonary disease with (acute) exacerbation: Secondary | ICD-10-CM | POA: Diagnosis present

## 2017-08-30 DIAGNOSIS — R0602 Shortness of breath: Secondary | ICD-10-CM | POA: Diagnosis present

## 2017-08-30 DIAGNOSIS — I130 Hypertensive heart and chronic kidney disease with heart failure and stage 1 through stage 4 chronic kidney disease, or unspecified chronic kidney disease: Secondary | ICD-10-CM | POA: Diagnosis present

## 2017-08-30 DIAGNOSIS — D631 Anemia in chronic kidney disease: Secondary | ICD-10-CM | POA: Diagnosis present

## 2017-08-30 DIAGNOSIS — R7989 Other specified abnormal findings of blood chemistry: Secondary | ICD-10-CM | POA: Diagnosis present

## 2017-08-30 DIAGNOSIS — I10 Essential (primary) hypertension: Secondary | ICD-10-CM | POA: Diagnosis not present

## 2017-08-30 DIAGNOSIS — Z9119 Patient's noncompliance with other medical treatment and regimen: Secondary | ICD-10-CM | POA: Diagnosis not present

## 2017-08-30 DIAGNOSIS — Z87891 Personal history of nicotine dependence: Secondary | ICD-10-CM | POA: Diagnosis not present

## 2017-08-30 DIAGNOSIS — E785 Hyperlipidemia, unspecified: Secondary | ICD-10-CM | POA: Diagnosis present

## 2017-08-30 DIAGNOSIS — K5900 Constipation, unspecified: Secondary | ICD-10-CM | POA: Diagnosis present

## 2017-08-30 DIAGNOSIS — Z23 Encounter for immunization: Secondary | ICD-10-CM | POA: Diagnosis present

## 2017-08-30 DIAGNOSIS — E039 Hypothyroidism, unspecified: Secondary | ICD-10-CM | POA: Diagnosis present

## 2017-08-30 DIAGNOSIS — I255 Ischemic cardiomyopathy: Secondary | ICD-10-CM | POA: Diagnosis present

## 2017-08-30 DIAGNOSIS — Z9981 Dependence on supplemental oxygen: Secondary | ICD-10-CM | POA: Diagnosis not present

## 2017-08-30 DIAGNOSIS — I959 Hypotension, unspecified: Secondary | ICD-10-CM | POA: Diagnosis present

## 2017-08-30 DIAGNOSIS — E43 Unspecified severe protein-calorie malnutrition: Secondary | ICD-10-CM | POA: Diagnosis present

## 2017-08-30 DIAGNOSIS — R0603 Acute respiratory distress: Secondary | ICD-10-CM | POA: Diagnosis not present

## 2017-08-30 DIAGNOSIS — N183 Chronic kidney disease, stage 3 (moderate): Secondary | ICD-10-CM | POA: Diagnosis present

## 2017-08-30 LAB — BASIC METABOLIC PANEL
ANION GAP: 8 (ref 5–15)
BUN: 13 mg/dL (ref 6–20)
CALCIUM: 8.4 mg/dL — AB (ref 8.9–10.3)
CHLORIDE: 88 mmol/L — AB (ref 101–111)
CO2: 29 mmol/L (ref 22–32)
Creatinine, Ser: 1.03 mg/dL (ref 0.61–1.24)
GFR calc Af Amer: >60 mL/min (ref >60)
GFR calc non Af Amer: >60 mL/min (ref >60)
GLUCOSE: 136 mg/dL — AB (ref 65–99)
Potassium: 4.5 mmol/L (ref 3.5–5.1)
Sodium: 125 mmol/L — ABNORMAL LOW (ref 135–145)

## 2017-08-30 LAB — URINALYSIS, ROUTINE W REFLEX MICROSCOPIC
BILIRUBIN URINE: NEGATIVE
Glucose, UA: NEGATIVE mg/dL
HGB URINE DIPSTICK: NEGATIVE
Ketones, ur: NEGATIVE mg/dL
Leukocytes, UA: NEGATIVE
Nitrite: NEGATIVE
PROTEIN: NEGATIVE mg/dL
Specific Gravity, Urine: 1.01 (ref 1.005–1.030)
pH: 5 (ref 5.0–8.0)

## 2017-08-30 LAB — CBC
HCT: 30.9 % — ABNORMAL LOW (ref 39.0–52.0)
HEMOGLOBIN: 10 g/dL — AB (ref 13.0–17.0)
MCH: 26.8 pg (ref 26.0–34.0)
MCHC: 32.4 g/dL (ref 30.0–36.0)
MCV: 82.8 fL (ref 78.0–100.0)
Platelets: 459 10*3/uL — ABNORMAL HIGH (ref 150–400)
RBC: 3.73 MIL/uL — ABNORMAL LOW (ref 4.22–5.81)
RDW: 16 % — ABNORMAL HIGH (ref 11.5–15.5)
WBC: 17.4 10*3/uL — ABNORMAL HIGH (ref 4.0–10.5)

## 2017-08-30 LAB — SODIUM, URINE, RANDOM: SODIUM UR: 25 mmol/L

## 2017-08-30 LAB — TROPONIN I: Troponin I: 0.03 ng/mL (ref <0.03)

## 2017-08-30 LAB — OSMOLALITY, URINE: Osmolality, Ur: 334 mOsm/kg (ref 300–900)

## 2017-08-30 MED ORDER — FUROSEMIDE 10 MG/ML IJ SOLN: 40.0000 mg | Freq: Every day | INTRAMUSCULAR | Status: DC

## 2017-08-30 MED ORDER — BUDESONIDE 0.25 MG/2ML IN SUSP
0.2500 mg | Freq: Two times a day (BID) | RESPIRATORY_TRACT | Status: DC
  Administered 2017-08-30 – 2017-09-03 (×9): 0.25 mg via RESPIRATORY_TRACT
  Filled 2017-08-30 (×9): qty 2

## 2017-08-30 MED ORDER — ZOLPIDEM TARTRATE 5 MG PO TABS
5.0000 mg | ORAL_TABLET | Freq: Once | ORAL | Status: AC
  Administered 2017-08-30: 5 mg via ORAL
  Filled 2017-08-30: qty 1

## 2017-08-30 MED ORDER — ENSURE ENLIVE PO LIQD
237.0000 mL | Freq: Two times a day (BID) | ORAL | Status: DC
  Administered 2017-08-30 – 2017-09-03 (×6): 237 mL via ORAL

## 2017-08-30 MED ORDER — ENOXAPARIN SODIUM 40 MG/0.4ML ~~LOC~~ SOLN
40.0000 mg | SUBCUTANEOUS | Status: DC
  Administered 2017-08-30 – 2017-09-03 (×5): 40 mg via SUBCUTANEOUS
  Filled 2017-08-30 (×5): qty 0.4

## 2017-08-30 MED ORDER — FUROSEMIDE 10 MG/ML IJ SOLN
40.0000 mg | Freq: Once | INTRAMUSCULAR | Status: AC
  Administered 2017-08-30: 40 mg via INTRAVENOUS
  Filled 2017-08-30: qty 4
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
## 2017-08-30 NOTE — Progress Notes (Signed)
CRITICAL VALUE ALERT  Critical Value: Troponin: 0.03  Date & Time Notied: 08/30/17 0900  Provider Notified: Cruzita Lederer   Orders Received/Actions taken: No further orders. Will continue to monitor.

## 2017-08-30 NOTE — Progress Notes (Signed)
Initial Nutrition Assessment  DOCUMENTATION CODES:   Severe malnutrition in context of chronic illness  INTERVENTION:   -Ensure Enlive po BID, each supplement provides 350 kcal and 20 grams of protein  -Recommend downgrading diet to Dysphagia III   NUTRITION DIAGNOSIS:   Malnutrition (Severe) related to chronic illness (COPD, CHF) as evidenced by energy intake < or equal to 75% for > or equal to 1 month, severe depletion of muscle mass.  GOAL:   Patient will meet greater than or equal to 90% of their needs  MONITOR:   PO intake, Supplement acceptance, Labs, Weight trends  REASON FOR ASSESSMENT:   Consult COPD Protocol  ASSESSMENT:   71 yo male admitted with respiratory distressed secondary to COPD exacerbation, CHF,  medical noncompliance and mild hyponatremia. Pt with hx of COPD on 4L O2 at home, HTN, CKD II, MI, CHF CM EF 15%. Noted pt lives at home along and has frequent falls  ECHO this admission shows EF improved to 65-70%  No recorded po intake. Pt ate maybe 25% at breakfast this AM. Pt reports at home he typically eats 1 meal per day and drinks 4-5 Ensure per week (<1 per day). Pt reports during the week his meal comes from [employer], on the weekend a friend will bring him a hamburger or BBQ.   Pt reports UBW 157, pt believes he has lost weight but unsure of how much  Nutrition-Focused physical exam completed. Findings are mi.d/moderate fat depletion, mild/moderate to severe muscle depletion, and no edema.   Pt is edentulous and reports meats need to be soft and tended, can chew raw veg/fruits  Labs: sodium 125, Creatinine wdl, potassium wdl Meds: solumedrol, prednisone  Diet Order:  DIET DYS 3 Room service appropriate? Yes; Fluid consistency: Thin  Skin:  Reviewed, no issues  Last BM:  9/9  Height:   Ht Readings from Last 1 Encounters:  08/29/17 5\' 7"  (1.702 m)    Weight:   Wt Readings from Last 1 Encounters:  08/29/17 157 lb (71.2 kg)     Ideal Body Weight:  67.3 kg  BMI:  Body mass index is 24.59 kg/m.  Estimated Nutritional Needs:   Kcal:  9163-8466 kcals  Protein:  89-100 g  Fluid:  >/= 1.8 L  EDUCATION NEEDS:   No education needs identified at this time  Clymer, Kimmell, LDN [phone] Pager  [phone] Weekend/On-Call Pager

## 2017-08-30 NOTE — Care Management Obs Status (Signed)
[facility] NOTIFICATION   Patient Details  Name: Douglas Mann MRN: [id number] Date of Birth: [birth year]   Medicare Observation Status Notification Given:  Yes Pt request CM to call his daughter, Ms Lala Lund to discuss OBS status. This CM discussed with pt daughter, Ms Franchot Mimes, who verbalizes understanding.    Adiba Fargnoli, Rory Percy, RN 08/30/2017, 11:03 AM

## 2017-08-30 NOTE — Progress Notes (Signed)
PROGRESS NOTE  Douglas Mann SWN:[id number] DOB: 1946/10/29 DOA: 08/29/2017 PCP: [name], MD   LOS: 0 days   Brief Narrative / Interim history: Douglas Mann is a very pleasant 71 y.o. male with medical history significant for COPD on 4 L at home, hypertension, chronic kidney disease, MI, CHF, hypothyroidism, since emergency Department chief complaint worsening shortness of breath. 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 EF 5 -10% by LV gram - following non-STEMI and ventricular tachycardia   Anemia, unspecified   Chronic systolic heart failure (HCC)   CKD (chronic kidney disease), stage III   Hypothyroidism   Benign prostatic hyperplasia   Hyponatremia   Acute respiratory distress   Leukocytosis   Constipation   COPD exacerbation (HCC)   Respiratory distress multifactorial due to COPD exacerbation but also a component of fluid overload -Patient wheezing  on admission, with cough and leukocytosis, he was started on Levaquin.  Continue.  Add Pulmicort.  Continue steroids -He also had evidence of mild fluid overload with elevated BNP, chest x-ray showing trace effusions and lower extremity edema.  He was given IV fluids overnight.  Resume Lasix this morning  Hyponatremia.  -Sodium level 124 on admission, 125 this morning.  Suspect related to mild fluid overload.  Monitor while on Lasix.  Acute on chronic systolic heart failure.  -Last echo 2015 with an EF of 15%. -Patient with mild fluid overload with lower extremity edema, give Lasix -Continue Coreg and amiodarone -Daily weights -Intake and output -Echocardiogram done on admission showed that the EF has improved to 93-81%, grade 1 diastolic  dysfunction  Hypertension -Blood pressure controlled this morning, continue Coreg, he was hypotensive on admission and his other medications are currently on hold.  Resume as needed but for now BP is OK  Chronic kidney disease. Stage II. Creatinine 1.05 on admission, 1.03 this morning.  Lasix 1, monitor  CAD. Cardiac cath 2015. Status post stent at [facility]. Home medications include aspirin Plavix statin. No chest pain.  -Cardiac enzymes essentially flat   DVT prophylaxis: Lovenox Code Status: Full code Family Communication: no family at bedside Disposition Plan: home when ready   Consultants:   none  Procedures:   none  Antimicrobials:  Levaquin 9/10 >>   Subjective: -shortness of breath improved today, no chest pain, no abdominal pain, nausea or vomiting.   Objective: Vitals:   08/30/17 0045 08/30/17 0438 08/30/17 0759 08/30/17 0950  BP: (!) 91/55 (!) 149/62  (!) 143/58  Pulse: 80 76  88  Resp: 20 19  19   Temp: 99.1 F (37.3 C) (!) 97.5 F (36.4 C)  98.2 F (36.8 C)  TempSrc: Oral Oral  Oral  SpO2: 100% 98% 92% 92%  Weight:      Height:        Intake/Output Summary (Last 24 hours) at 08/30/17 1232 Last data filed at 08/30/17 1045  Gross per 24 hour  Intake          1195.83 ml  Output              225 ml  Net           970.83 ml   Filed Weights   08/29/17 1324  Weight: 71.2 kg (157 lb)  Examination:  Vitals:   08/30/17 0045 08/30/17 0438 08/30/17 0759 08/30/17 0950  BP: (!) 91/55 (!) 149/62  (!) 143/58  Pulse: 80 76  88  Resp: 20 19  19   Temp: 99.1 F (37.3 C) (!) 97.5 F (36.4 C)  98.2 F (36.8 C)  TempSrc: Oral Oral  Oral  SpO2: 100% 98% 92% 92%  Weight:      Height:        Constitutional: NAD Eyes: lids and conjunctivae normal Respiratory: Scattered end expiratory wheezing. Normal respiratory effort. No accessory muscle use.  Cardiovascular: Regular rate and rhythm, no murmurs / rubs / gallops. 1-2+ pitting LE edema. 2+ pedal pulses.   Abdomen: no tenderness. Bowel sounds positive.  Skin: no rashes, lesions, ulcers. No induration Neurologic: CN 2-12 grossly intact. Strength 5/5 in all 4.  Psychiatric: Normal judgment and insight. Alert and oriented x 3. Normal mood.    Data Reviewed: I have independently reviewed following labs and imaging studies   CBC:  Recent Labs Lab 08/29/17 1400 08/30/17 0609  WBC 18.2* 17.4*  NEUTROABS 15.0*  --   HGB 11.0* 10.0*  HCT 33.8* 30.9*  MCV 82.8 82.8  PLT 419* 381*   Basic Metabolic Panel:  Recent Labs Lab 08/29/17 1400 08/30/17 0609  NA 124* 125*  K 4.2 4.5  CL 84* 88*  CO2 30 29  GLUCOSE 111* 136*  BUN 11 13  CREATININE 1.05 1.03  CALCIUM 8.4* 8.4*   GFR: Estimated Creatinine Clearance: 59.7 mL/min (by C-G formula based on SCr of 1.03 mg/dL). Liver Function Tests:  Recent Labs Lab 08/29/17 1400  AST 33  ALT 30  ALKPHOS 97  BILITOT 0.7  PROT 7.1  ALBUMIN 2.3*   No results for input(s): LIPASE, AMYLASE in the last 168 hours. No results for input(s): AMMONIA in the last 168 hours. Coagulation Profile: No results for input(s): INR, PROTIME in the last 168 hours. Cardiac Enzymes:  Recent Labs Lab 08/29/17 1833 08/30/17 0034 08/30/17 0609  TROPONINI 0.03* <0.03 0.03*   BNP (last 3 results) No results for input(s): PROBNP in the last 8760 hours. HbA1C: No results for input(s): HGBA1C in the last 72 hours. CBG: No results for input(s): GLUCAP in the last 168 hours. Lipid Profile: No results for input(s): CHOL, HDL, LDLCALC, TRIG, CHOLHDL, LDLDIRECT in the last 72 hours. Thyroid Function Tests:  Recent Labs  08/29/17 1833  TSH 0.335*   Anemia Panel: No results for input(s): VITAMINB12, FOLATE, FERRITIN, TIBC, IRON, RETICCTPCT in the last 72 hours. Urine analysis:    Component Value Date/Time   COLORURINE yellow 12/23/2010 1050   APPEARANCEUR Clear 12/23/2010 1050   LABSPEC 1.015 12/23/2010 1050   PHURINE 7.5 12/23/2010 1050   HGBUR  negative 12/23/2010 1050   BILIRUBINUR negative 12/23/2010 1050   UROBILINOGEN 0.2 12/23/2010 1050   NITRITE negative 12/23/2010 1050   Sepsis Labs: Invalid input(s): PROCALCITONIN, LACTICIDVEN  No results found for this or any previous visit (from the past 240 hour(s)).    Radiology Studies: Dg Chest 2 View  Result Date: 08/29/2017 CLINICAL DATA:  Dyspnea and productive cough EXAM: CHEST  2 VIEW COMPARISON:  08/23/2014 FINDINGS: Heart is top-normal in size. No aortic aneurysm. Mild emphysematous hyperinflation of the lungs bilaterally with minimal atelectasis at each lung base left slightly greater than right. No pneumonic consolidation or overt pulmonary edema. Minimal blunting of the posterior costophrenic angles bilaterally consistent with trace pleural effusions, pleural blunting due to hyperinflated lungs or pleural thickening. No acute  nor suspicious osseous abnormality. IMPRESSION: 1. Emphysematous hyperinflation of the lungs. 2. Slight blunting of the posterior costophrenic angles may reflect pleural blunting due to hyperinflated lungs, trace effusions with minimal pleural thickening. Electronically Signed   By: Ashley Royalty M.D.   On: 08/29/2017 15:19     Scheduled Meds: . amiodarone  100 mg Oral Daily  . atorvastatin  40 mg Oral Daily  . budesonide (PULMICORT) nebulizer solution  0.25 mg Nebulization BID  . carvedilol  3.125 mg Oral BID  . clopidogrel  75 mg Oral Daily  . feeding supplement (ENSURE ENLIVE)  237 mL Oral BID BM  . guaiFENesin  600 mg Oral BID  . ipratropium-albuterol  3 mL Nebulization TID  . levofloxacin  500 mg Oral Daily  . levothyroxine  25 mcg Oral QAC breakfast  . methylPREDNISolone (SOLU-MEDROL) injection  60 mg Intravenous Q12H  . ranolazine  500 mg Oral BID  . tamsulosin  0.4 mg Oral Daily   Continuous Infusions:   Marzetta Board, MD, PhD [facility] Pager [phone]  If 7PM-7AM, please contact night-coverage [email or web address] Password  [facility] 08/30/2017, 12:32 PM

## 2017-08-31 DIAGNOSIS — D72829 Elevated white blood cell count, unspecified: Secondary | ICD-10-CM

## 2017-08-31 DIAGNOSIS — I2510 Atherosclerotic heart disease of native coronary artery without angina pectoris: Secondary | ICD-10-CM

## 2017-08-31 DIAGNOSIS — N183 Chronic kidney disease, stage 3 (moderate): Secondary | ICD-10-CM

## 2017-08-31 DIAGNOSIS — I5022 Chronic systolic (congestive) heart failure: Secondary | ICD-10-CM

## 2017-08-31 DIAGNOSIS — E785 Hyperlipidemia, unspecified: Secondary | ICD-10-CM

## 2017-08-31 DIAGNOSIS — E039 Hypothyroidism, unspecified: Secondary | ICD-10-CM

## 2017-08-31 DIAGNOSIS — I10 Essential (primary) hypertension: Secondary | ICD-10-CM

## 2017-08-31 DIAGNOSIS — R05 Cough: Secondary | ICD-10-CM

## 2017-08-31 DIAGNOSIS — J449 Chronic obstructive pulmonary disease, unspecified: Secondary | ICD-10-CM

## 2017-08-31 DIAGNOSIS — J441 Chronic obstructive pulmonary disease with (acute) exacerbation: Principal | ICD-10-CM

## 2017-08-31 DIAGNOSIS — E43 Unspecified severe protein-calorie malnutrition: Secondary | ICD-10-CM

## 2017-08-31 DIAGNOSIS — E871 Hypo-osmolality and hyponatremia: Secondary | ICD-10-CM

## 2017-08-31 DIAGNOSIS — N400 Enlarged prostate without lower urinary tract symptoms: Secondary | ICD-10-CM

## 2017-08-31 DIAGNOSIS — R0603 Acute respiratory distress: Secondary | ICD-10-CM

## 2017-08-31 DIAGNOSIS — I255 Ischemic cardiomyopathy: Secondary | ICD-10-CM

## 2017-08-31 LAB — CBC
HCT: 29.9 % — ABNORMAL LOW (ref 39.0–52.0)
Hemoglobin: 9.8 g/dL — ABNORMAL LOW (ref 13.0–17.0)
MCH: 26.9 pg (ref 26.0–34.0)
MCHC: 32.8 g/dL (ref 30.0–36.0)
MCV: 82.1 fL (ref 78.0–100.0)
PLATELETS: 474 10*3/uL — AB (ref 150–400)
RBC: 3.64 MIL/uL — AB (ref 4.22–5.81)
RDW: 16 % — AB (ref 11.5–15.5)
WBC: 21 10*3/uL — ABNORMAL HIGH (ref 4.0–10.5)

## 2017-08-31 LAB — BASIC METABOLIC PANEL WITH GFR
Anion gap: 5 (ref 5–15)
BUN: 18 mg/dL (ref 6–20)
CO2: 30 mmol/L (ref 22–32)
Calcium: 8.1 mg/dL — ABNORMAL LOW (ref 8.9–10.3)
Chloride: 89 mmol/L — ABNORMAL LOW (ref 101–111)
Creatinine, Ser: 1.1 mg/dL (ref 0.61–1.24)
GFR calc Af Amer: >60 mL/min
GFR calc non Af Amer: >60 mL/min
Glucose, Bld: 151 mg/dL — ABNORMAL HIGH (ref 65–99)
Potassium: 3.9 mmol/L (ref 3.5–5.1)
Sodium: 124 mmol/L — ABNORMAL LOW (ref 135–145)

## 2017-08-31 MED ORDER — IPRATROPIUM-ALBUTEROL 0.5-2.5 (3) MG/3ML IN SOLN
3.0000 mL | Freq: Two times a day (BID) | RESPIRATORY_TRACT | Status: DC
  Administered 2017-08-31 – 2017-09-03 (×6): 3 mL via RESPIRATORY_TRACT
  Filled 2017-08-31 (×6): qty 3

## 2017-08-31 MED ORDER — GUAIFENESIN ER 600 MG PO TB12
1200.0000 mg | ORAL_TABLET | Freq: Two times a day (BID) | ORAL | Status: DC
  Administered 2017-08-31 – 2017-09-03 (×6): 1200 mg via ORAL
  Filled 2017-08-31 (×6): qty 2

## 2017-08-31 MED ORDER — INFLUENZA VAC SPLIT HIGH-DOSE 0.5 ML IM SUSY
0.5000 mL | PREFILLED_SYRINGE | INTRAMUSCULAR | Status: AC
  Administered 2017-09-03: 0.5 mL via INTRAMUSCULAR
  Filled 2017-08-31: qty 0.5

## 2017-08-31 MED ORDER — ZOLPIDEM TARTRATE 5 MG PO TABS
5.0000 mg | ORAL_TABLET | Freq: Once | ORAL | Status: AC
  Administered 2017-08-31: 5 mg via ORAL
  Filled 2017-08-31: qty 1

## 2017-08-31 MED ORDER — FUROSEMIDE 10 MG/ML IJ SOLN
40.0000 mg | Freq: Once | INTRAMUSCULAR | Status: AC
  Administered 2017-08-31: 40 mg via INTRAVENOUS
  Filled 2017-08-31: qty 4

## 2017-08-31 NOTE — Progress Notes (Signed)
PROGRESS NOTE    Douglas Mann  WGY:[id number] DOB: 1946-12-16 DOA: 08/29/2017 PCP: [name], MD   Brief Narrative:  Douglas Mann a very pleasant 71 y.o.malewith medical history significant for COPD on 4 L at home, hypertension, chronic kidney disease, MI, CHF, hypothyroidism, who presented to th Emergency Department with a chief complaint worsening shortness of breath. Evaluation in the ER per admitting MD revealed respiratory distress likely related to COPD exacerbation. Given 1 dose of IV Lasix for mild volume overload and was repeated today.   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 EF 5 -10% by LV gram - following non-STEMI and ventricular tachycardia   Anemia, unspecified   Chronic systolic heart failure (HCC)   CKD (chronic kidney disease), stage III   Hypothyroidism   Benign prostatic hyperplasia   Hyponatremia   Acute respiratory distress   Leukocytosis   Constipation   COPD exacerbation (HCC)   Severe protein-calorie malnutrition (HCC)  Acute Respiratory distress multifactorial due to COPD exacerbation but also a component of fluid overload -Patient wheezing  on admission, with cough and leukocytosis, he was started on Levaquin and will continue -Home Baseline of 4 Liters  -Continue Steroids with IV Solumedrol 60 mg q12h and DuoNeb Nebulizers TID -C/w Pulmicort 0.25 mg po BID -C/w Flutter Valve, Incentive Spirometry, and Increased Guaifenesin to 1200 mg po BID  -He also had evidence of mild fluid overload with elevated BNP, chest x-ray showing trace effusions and lower extremity edema.  He was given IV fluids on the night of admission. Given Lasix yesterday and this AM -Repeat CXR in AM -If not improving consider Pulmonary Consultation and obtain CTA of Chest  to r/o PE   Hyponatremia.  -Sodium level 124on admission, 125 yesterday and 124 this AM.  Suspect related to mild fluid overload.  Monitor while on Lasix. -Serum Osmols was 271; Urine Osmol was 334 -Continue to Monitor CMP's and if not improving will discuss with Nephrology in AM   Acute on Chronic Systolic and Diastolic Heart failure.  -Last echo 2015 with an EF of 15%. -Patient with mild fluid overload with lower extremity edema, given Lasix yesterday and will repeat Today  -Continue Coreg and amiodarone -Daily weights; Patient is down 1 lb.  -Strict Intake and output; Patient is -849.2 Liters -Echocardiogram done on admission showed that the EF has improved to 77-93%, grade 1 diastolic dysfunction  Hypertension -Blood pressure controlled this morning, Continue Coreg, he was hypotensive on admission and his other medications are currently on hold.  -Resume other medications slowly (Both ACE and ARB on MAR and will need to clarify which one he takes)   Chronic Kidney Disease Stage II.  -Creatinine 1.05 on admission, 1.10 this morning.  Lasix 1 yesterday and will repeat Lasix this AM -Continue to Monitor Renal Fxn   CAD s/p Stenting  -Cardiac cath 2015. Status post stent at [facility].  -Home medications include aspirin Plavix statin. No chest pain.  -Cardiac enzymes essentially flat at 0.03, <0.03, 0.03 -C/w Atorvastatin 40 mg po Daily, Carvedilol 3.125 mg po BID, and Ranolazine 500 mg po BID; ACE/ARB Held on Admission   Hypothyroidism -TSH was 0.335 -C/w Levothyroxine 25 mcg po Daily   HLD -C/w Atorvastatin 40 mg po Daily  -Fish Oil was discontinued on Admission.  Severe Protein Calorie Malnutrition in the Context of Chronic Illness -Nutritionist Consulted -C/w Ensure Enlive po BID -C/w Dysphagia 3 Diet.   BPH -C/w Tamsulosin 0.4 mg po Daily   Leukocytosis -Likely from COPD Exacerbation and IV Steroid Demargination -WBC went from 17.4 -> 21.0  -No Cx obtained on  Admission; C/w Empiric Levofloxacin -Continue to Monitor for S/Sx of Infection: If patient spikes a temperature will obtain Blood Cx -If WBC continues to worsen will broaden Abx Coveraage -Urine was unremarkable  -Repeat CMP in AM  DVT prophylaxis: Enoxaparin 40 mg sq q24h Code Status: FULL CODE Family Communication: No family present at beside Disposition Plan: SNF but patient refusing so will go home with Home Health when medically stable   Consultants:   None   Procedures: ECHOCARDIOGRAM Study Conclusions  - Left ventricle: The cavity size was normal. Wall thickness was   increased in a pattern of mild LVH. Systolic function was   vigorous. The estimated ejection fraction was in the range of 65%   to 70%. Doppler parameters are consistent with abnormal left   ventricular relaxation (grade 1 diastolic dysfunction).   Antimicrobials:  Anti-infectives    Start     Dose/Rate Route Frequency Ordered Stop   08/29/17 1730  levofloxacin (LEVAQUIN) tablet 500 mg     500 mg Oral Daily 08/29/17 1652       Subjective: Seen and examined and stated he was still weak but SOB slightly improved. States when he rests he is fine but when he ambulates he gets extremely SOB. No N/V. Refusing SNF and states he wants to go home when he is ready for D/C.  Objective: Vitals:   08/30/17 2142 08/31/17 0537 08/31/17 0845 08/31/17 1000  BP: 120/68 (!) 127/55  121/69  Pulse: (!) 48 75  67  Resp: 19 20  20   Temp: 98.4 F (36.9 C) 98.6 F (37 C)  98.4 F (36.9 C)  TempSrc: Oral Oral  Oral  SpO2: 95% 98% 98% 98%  Weight: 71.2 kg (156 lb 15.5 oz)     Height:        Intake/Output Summary (Last 24 hours) at 08/31/17 1412 Last data filed at 08/31/17 1000  Gross per 24 hour  Intake              660 ml  Output             2000 ml  Net            -1340 ml   Filed Weights   08/29/17 1324 08/30/17 2142  Weight: 71.2 kg (157 lb) 71.2 kg (156 lb 15.5 oz)   Examination: Physical  Exam:  Constitutional: Thin elderly Caucasain male in NAD and appears calm sitting at bedside Eyes: Lids and conjunctivae normal, sclerae anicteric  ENMT: External Ears, Nose appear normal. Grossly normal hearing. Mucous membranes are moist. Neck: Appears normal, supple, no cervical masses, normal ROM, no appreciable thyromegaly, no JVD Respiratory: Diminished to auscultation bilaterally with wheezing worse on Left than Right; No appreciable rales, rhonchi or crackles. Slightly increased respiratory effort. No accessory muscle use.  Cardiovascular: RRR, no murmurs / rubs / gallops. S1 and S2 auscultated. 1+ LE Edema Abdomen: Soft, non-tender, non-distended. No masses palpated. No appreciable hepatosplenomegaly. Bowel sounds positive x4.  GU: Deferred. Musculoskeletal: No clubbing / cyanosis of digits/nails. No joint deformity upper and lower extremities.  Skin: Has a lower extremity graft on Right leg; No induration; Warm and dry.  Neurologic: CN 2-12 grossly  intact with no focal deficits. Strength 5/5 in all 4. Romberg sign cerebellar reflexes not assessed.  Psychiatric: Normal judgment and insight. Alert and oriented x 3. Normal mood and appropriate affect.   Data Reviewed: I have personally reviewed following labs and imaging studies  CBC:  Recent Labs Lab 08/29/17 1400 08/30/17 0609 08/31/17 0518  WBC 18.2* 17.4* 21.0*  NEUTROABS 15.0*  --   --   HGB 11.0* 10.0* 9.8*  HCT 33.8* 30.9* 29.9*  MCV 82.8 82.8 82.1  PLT 419* 459* 500*   Basic Metabolic Panel:  Recent Labs Lab 08/29/17 1400 08/30/17 0609 08/31/17 0518  NA 124* 125* 124*  K 4.2 4.5 3.9  CL 84* 88* 89*  CO2 30 29 30   GLUCOSE 111* 136* 151*  BUN 11 13 18   CREATININE 1.05 1.03 1.10  CALCIUM 8.4* 8.4* 8.1*   GFR: Estimated Creatinine Clearance: 55.9 mL/min (by C-G formula based on SCr of 1.1 mg/dL). Liver Function Tests:  Recent Labs Lab 08/29/17 1400  AST 33  ALT 30  ALKPHOS 97  BILITOT 0.7  PROT  7.1  ALBUMIN 2.3*   No results for input(s): LIPASE, AMYLASE in the last 168 hours. No results for input(s): AMMONIA in the last 168 hours. Coagulation Profile: No results for input(s): INR, PROTIME in the last 168 hours. Cardiac Enzymes:  Recent Labs Lab 08/29/17 1833 08/30/17 0034 08/30/17 0609  TROPONINI 0.03* <0.03 0.03*   BNP (last 3 results) No results for input(s): PROBNP in the last 8760 hours. HbA1C: No results for input(s): HGBA1C in the last 72 hours. CBG: No results for input(s): GLUCAP in the last 168 hours. Lipid Profile: No results for input(s): CHOL, HDL, LDLCALC, TRIG, CHOLHDL, LDLDIRECT in the last 72 hours. Thyroid Function Tests:  Recent Labs  08/29/17 1833  TSH 0.335*   Anemia Panel: No results for input(s): VITAMINB12, FOLATE, FERRITIN, TIBC, IRON, RETICCTPCT in the last 72 hours. Sepsis Labs: No results for input(s): PROCALCITON, LATICACIDVEN in the last 168 hours.  No results found for this or any previous visit (from the past 240 hour(s)).   Radiology Studies: Dg Chest 2 View  Result Date: 08/29/2017 CLINICAL DATA:  Dyspnea and productive cough EXAM: CHEST  2 VIEW COMPARISON:  08/23/2014 FINDINGS: Heart is top-normal in size. No aortic aneurysm. Mild emphysematous hyperinflation of the lungs bilaterally with minimal atelectasis at each lung base left slightly greater than right. No pneumonic consolidation or overt pulmonary edema. Minimal blunting of the posterior costophrenic angles bilaterally consistent with trace pleural effusions, pleural blunting due to hyperinflated lungs or pleural thickening. No acute nor suspicious osseous abnormality. IMPRESSION: 1. Emphysematous hyperinflation of the lungs. 2. Slight blunting of the posterior costophrenic angles may reflect pleural blunting due to hyperinflated lungs, trace effusions with minimal pleural thickening. Electronically Signed   By: Ashley Royalty M.D.   On: 08/29/2017 15:19   Scheduled Meds: .  amiodarone  100 mg Oral Daily  . atorvastatin  40 mg Oral Daily  . budesonide (PULMICORT) nebulizer solution  0.25 mg Nebulization BID  . carvedilol  3.125 mg Oral BID  . clopidogrel  75 mg Oral Daily  . enoxaparin (LOVENOX) injection  40 mg Subcutaneous Q24H  . feeding supplement (ENSURE ENLIVE)  237 mL Oral BID BM  . guaiFENesin  1,200 mg Oral BID  . ipratropium-albuterol  3 mL Nebulization TID  . levofloxacin  500 mg Oral Daily  . levothyroxine  25 mcg Oral QAC breakfast  . methylPREDNISolone (SOLU-MEDROL) injection  60  mg Intravenous Q12H  . ranolazine  500 mg Oral BID  . tamsulosin  0.4 mg Oral Daily   Continuous Infusions:   LOS: 1 day   Kerney Elbe, DO [facility] Pager [phone]  If 7PM-7AM, please contact night-coverage [email or web address] Password [email or web address] 08/31/2017, 2:12 PM

## 2017-08-31 NOTE — Consult Note (Signed)
[facility] CM Primary Care Navigator  08/31/2017  Douglas Mann 1946-09-25 [id number]   Met with patient at the bedside to identify possible discharge needs. Patientreports having weakness, increased shortness of breath and chest discomfort that had led to this admission. Patient reports that he resides at [employer] which is currently being renovated so he is presently housed in a motel until apartment is fixed. Patient endorses Dr. Fransisca Kaufmann [place] [facility] [place] as the primary care provider.   Patient shared using [facility] to obtain medications without difficulty.   Patient states that hemanages his own medications at home with use of "pill box" system filled weekly.  His friend/ neighbor Douglas Gross S.) has been providing transportation to hisdoctors'appointments.  Patient's daughter Douglas Mann) is supportive of his care, "taking charge" of his care needs and serves as his primary caregiver per patient.   Anticipated plan for discharge per patient is either to return back to the motel, then back to [employer] after completion of renovation, if not, will stay at his daughter's house until apartment is ready. PT recommendation is for skilled nursing facility (SNF) to increase independence and safety with mobility.   Patient voiced understanding to call primary care provider's office when he returns back home, for a post discharge follow-up appointment within a week or sooner if needs arise.Patient letter (with PCP's contact number) was provided as hisreminder.  Discussed withpatient about [facility] CM services available for health management at home but he adamantly refused in several occassions that was offered. Patient verbalized "to be honest, I will be very much aggravated by calls or visits from other people". He states being able to manage chronic illnesses at home with  daughter taking charge.  Primary care provider's office is listed doing transition of care (TOC).  He was encouraged to seek referralfrom primary care providerto [facility] care management if deemed necessary for services in the future or if he changes his mind to accepting services. Patient expressed understanding.  [facility] care management information provided for future needs that he may have.  For questions, please contact:  Dannielle Huh, BSN, RN- [facility] Primary Care Navigator  Telephone: [phone] [place]

## 2017-09-01 ENCOUNTER — Inpatient Hospital Stay (HOSPITAL_COMMUNITY): Payer: Medicare HMO

## 2017-09-01 DIAGNOSIS — D75839 Thrombocytosis, unspecified: Secondary | ICD-10-CM

## 2017-09-01 DIAGNOSIS — D473 Essential (hemorrhagic) thrombocythemia: Secondary | ICD-10-CM

## 2017-09-01 DIAGNOSIS — D649 Anemia, unspecified: Secondary | ICD-10-CM

## 2017-09-01 DIAGNOSIS — K5900 Constipation, unspecified: Secondary | ICD-10-CM

## 2017-09-01 DIAGNOSIS — R0602 Shortness of breath: Secondary | ICD-10-CM

## 2017-09-01 LAB — CBC WITH DIFFERENTIAL/PLATELET
BASOS ABS: 0 10*3/uL (ref 0.0–0.1)
BASOS PCT: 0 %
EOS ABS: 0 10*3/uL (ref 0.0–0.7)
Eosinophils Relative: 0 %
HCT: 31.1 % — ABNORMAL LOW (ref 39.0–52.0)
HEMOGLOBIN: 9.8 g/dL — AB (ref 13.0–17.0)
LYMPHS ABS: 1 10*3/uL (ref 0.7–4.0)
Lymphocytes Relative: 6 %
MCH: 26.3 pg (ref 26.0–34.0)
MCHC: 31.5 g/dL (ref 30.0–36.0)
MCV: 83.4 fL (ref 78.0–100.0)
MONOS PCT: 8 %
Monocytes Absolute: 1.3 10*3/uL — ABNORMAL HIGH (ref 0.1–1.0)
NEUTROS ABS: 13.7 10*3/uL — AB (ref 1.7–7.7)
Neutrophils Relative %: 86 %
Platelets: 546 10*3/uL — ABNORMAL HIGH (ref 150–400)
RBC: 3.73 MIL/uL — AB (ref 4.22–5.81)
RDW: 16.2 % — ABNORMAL HIGH (ref 11.5–15.5)
WBC: 16 10*3/uL — ABNORMAL HIGH (ref 4.0–10.5)

## 2017-09-01 LAB — COMPREHENSIVE METABOLIC PANEL
ALBUMIN: 2.2 g/dL — AB (ref 3.5–5.0)
ALK PHOS: 109 U/L (ref 38–126)
ALT: 30 U/L (ref 17–63)
AST: 28 U/L (ref 15–41)
Anion gap: 8 (ref 5–15)
BUN: 25 mg/dL — AB (ref 6–20)
CALCIUM: 8.5 mg/dL — AB (ref 8.9–10.3)
CO2: 33 mmol/L — AB (ref 22–32)
CREATININE: 1.12 mg/dL (ref 0.61–1.24)
Chloride: 88 mmol/L — ABNORMAL LOW (ref 101–111)
GFR calc Af Amer: >60 mL/min (ref >60)
GFR calc non Af Amer: >60 mL/min (ref >60)
GLUCOSE: 135 mg/dL — AB (ref 65–99)
Potassium: 4.5 mmol/L (ref 3.5–5.1)
Sodium: 129 mmol/L — ABNORMAL LOW (ref 135–145)
Total Bilirubin: 0.4 mg/dL (ref 0.3–1.2)
Total Protein: 6.3 g/dL — ABNORMAL LOW (ref 6.5–8.1)

## 2017-09-01 LAB — MAGNESIUM: Magnesium: 2.4 mg/dL (ref 1.7–2.4)

## 2017-09-01 LAB — PHOSPHORUS: Phosphorus: 3.4 mg/dL (ref 2.5–4.6)

## 2017-09-01 MED ORDER — METHYLPREDNISOLONE SODIUM SUCC 40 MG IJ SOLR
40.0000 mg | Freq: Two times a day (BID) | INTRAMUSCULAR | Status: DC
  Administered 2017-09-02 – 2017-09-03 (×3): 40 mg via INTRAVENOUS
  Filled 2017-09-01 (×3): qty 1

## 2017-09-01 MED ORDER — FUROSEMIDE 10 MG/ML IJ SOLN
40.0000 mg | Freq: Once | INTRAMUSCULAR | Status: AC
  Administered 2017-09-01: 40 mg via INTRAVENOUS
  Filled 2017-09-01: qty 4

## 2017-09-01 MED ORDER — ZOLPIDEM TARTRATE 5 MG PO TABS
5.0000 mg | ORAL_TABLET | Freq: Every evening | ORAL | Status: DC | PRN
  Administered 2017-09-01 – 2017-09-02 (×2): 5 mg via ORAL
  Filled 2017-09-01 (×2): qty 1

## 2017-09-01 NOTE — Progress Notes (Signed)
PT Cancellation Note  Patient Details Name: Douglas Mann MRN: [id number] DOB: [birth year]   Cancelled Treatment:    Reason Eval/Treat Not Completed: Patient declined, no reason specified.  Complaining about his living situation, but declined therapy and will try again tomorrow to work with him.   Ramond Dial 09/01/2017, 5:13 PM   Mee Hives, PT MS Acute Rehab Dept. Number: [place] [facility] [place]

## 2017-09-01 NOTE — Progress Notes (Signed)
PROGRESS NOTE    Douglas Mann  FUX:[id number] DOB: October 19, 1946 DOA: 08/29/2017 PCP: [name], MD   Brief Narrative:  Douglas Mann a very pleasant 71 y.o.malewith medical history significant for COPD on 4 L at home, hypertension, chronic kidney disease, MI, CHF, hypothyroidism, who presented to th Emergency Department with a chief complaint worsening shortness of breath. Evaluation in the ER per admitting MD revealed respiratory distress likely related to COPD exacerbation. Patient has been diuresed with IV Lasix 40 mg daily for the last 2 days and will repeat again as patient has volume overload and some degree of Hypervolemic Hyponatremia. PT Evaluated and recommending SNF but patient refusing and likely to go home with [facility].   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 EF 5 -10% by LV gram - following non-STEMI and ventricular tachycardia   Normocytic anemia   Chronic systolic heart failure (HCC)   CKD (chronic kidney disease), stage III   Hypothyroidism   Benign prostatic hyperplasia   Hyponatremia   Acute respiratory distress   Leukocytosis   Constipation   COPD exacerbation (HCC)   Severe protein-calorie malnutrition (HCC)   Thrombocytosis (HCC)  Acute Respiratory Distress multifactorial due to COPD exacerbation but also a component of fluid overload, improved  -Patient wheezing on admission, with cough and leukocytosis, he was started on Levaquin and will continue -Wheezing has improved.  -Home Baseline of 4 Liters  -Will chang Steroids with IV Solumedrol 60 mg q12h to IV Solumedrol 40 mg q12h and DuoNeb Nebulizers TID -C/w Pulmicort 0.25 mg po BID -C/w Flutter Valve, Incentive Spirometry, and Increased Guaifenesin to 1200 mg po BID  -He also had  evidence of mild fluid overload with elevated BNP, chest x-ray showing trace effusions and lower extremity edema.  He was given IV fluids on the night of admission. Given Lasix last 2 days and will repeat -Repeat CXR this AM showed Upper normal heart size. Stable hyperaeration and interstitial prominence. No pneumothorax. Pleural effusions are not appreciated but there is no lateral view; Impression read as no active disease.  -If not improving consider Pulmonary Consultation and obtain CTA of Chest to r/o PE   Hyponatremia.  -Sodium level 124on admission; Improved to 129 today after Administration of Lasix; Likely Hypervolemic Hyponatremia from CHF vs SIADH -Serum Osmols was 271; Urine Osmol was 334 -Repeat IV Lasix 40 mg po Daily  -Continue to Monitor CMP's and if not improving will discuss with Nephrology in AM   Acute on Chronic Systolic and Diastolic Heart failure.  -Last echo 2015 with an EF of 15%. -Patient with mild fluid overload with lower extremity edema, given Lasix Last 2 days and will repeat Today  -Continue Coreg and Amiodarone -Daily weights; Patient is down 1 lb.  -Strict Intake and output; Patient is -2.1042 Liters -Echocardiogram done on admission showed that the EF has improved to 02-54%, grade 1 diastolic dysfunction -Continue to Monitor Volume Status   Hypertension -Blood pressure controlled this morning, Continue Coreg, he was hypotensive on admission and his other medications are currently on hold.  -Resume other medications slowly (Both ACE and ARB on MAR and will need to clarify which one he takes)   Chronic Kidney Disease Stage II.  -Creatinine 1.05 on admission, 1.12 this morning. Repeat Lasix in AM  -  Continue to Monitor Renal Fxn   CAD s/p Stenting  -Cardiac cath 2015. Status post stent at [facility].  -Home medications include aspirin Plavix statin. No chest pain.  -Cardiac enzymes essentially flat at 0.03, <0.03, 0.03 -C/w Atorvastatin 40 mg po Daily,  Carvedilol 3.125 mg po BID, and Ranolazine 500 mg po BID; ACE/ARB Held on Admission   Hypothyroidism -TSH was 0.335 -C/w Levothyroxine 25 mcg po Daily   HLD -C/w Atorvastatin 40 mg po Daily  -Fish Oil was discontinued on Admission.   Severe Protein Calorie Malnutrition in the Context of Chronic Illness -Nutritionist Consulted -C/w Ensure Enlive po BID -C/w Dysphagia 3 Diet.   BPH -C/w Tamsulosin 0.4 mg po Daily   Leukocytosis -Likely from COPD Exacerbation and IV Steroid Demargination -WBC went from 17.4 -> 21.0 -> 16.0 -No Cx obtained on Admission; C/w Empiric Levofloxacin -Continue to Monitor for S/Sx of Infection: If patient spikes a temperature will obtain Blood Cx -If WBC continues to worsen will broaden Abx Coveraage -Urine was unremarkable  -Repeat CMP in AM  Thrombocytosis -Likely reactive -Patient's Platelet Count went from 459 -> 474 -> 546 -Continue to Monitor and Repeat CBC in AM   Normocytic Anemia -Likely in the setting of Chronic Disease -Patient's Hb/Hct Stable at 9.8/31.1 -Continue to Monitor for S/Sx of Bleeding -Repeat CBC in AM   DVT prophylaxis: Enoxaparin 40 mg sq q24h Code Status: FULL CODE Family Communication: No family present at beside Disposition Plan: SNF but patient refusing so will go home with Home Health when medically stable and anticipate D/C in next 24-48 hours  Consultants:   None   Procedures: ECHOCARDIOGRAM Study Conclusions  - Left ventricle: The cavity size was normal. Wall thickness was   increased in a pattern of mild LVH. Systolic function was   vigorous. The estimated ejection fraction was in the range of 65%   to 70%. Doppler parameters are consistent with abnormal left   ventricular relaxation (grade 1 diastolic dysfunction).   Antimicrobials:  Anti-infectives    Start     Dose/Rate Route Frequency Ordered Stop   08/29/17 1730  levofloxacin (LEVAQUIN) tablet 500 mg     500 mg Oral Daily 08/29/17 1652         Subjective: Seen and examined and stated his cough is improving and he was feeling a little stronger. No nausea or vomiting. States he cannot go back to his apartment because it is being renovated but will go live with his daughter as opposed to going to SNF. No other concerns or complaints  Objective: Vitals:   09/01/17 0547 09/01/17 0901 09/01/17 0906 09/01/17 1813  BP: (!) 147/58 (!) 142/54  133/63  Pulse: 76 85  84  Resp: 18 18  18   Temp: 98 F (36.7 C) (!) 97.5 F (36.4 C)  97.8 F (36.6 C)  TempSrc: Oral Oral  Oral  SpO2: 100% 96% 96% 99%  Weight:      Height:        Intake/Output Summary (Last 24 hours) at 09/01/17 1843 Last data filed at 09/01/17 1700  Gross per 24 hour  Intake             1260 ml  Output             1875 ml  Net             -615 ml   Filed Weights   08/29/17 1324 08/30/17 2142 08/31/17 2147  Weight: 71.2 kg (157 lb) 71.2 kg (  156 lb 15.5 oz) 70.8 kg (156 lb)   Examination: Physical Exam:  Constitutional: Thin elderly Caucasian Male in NAD. Appears calm sitting in chair Eyes: Lids and conjunctivae appear normal/ ENMT: External ears and nose appear normal Neck: Supple with no JVD Respiratory: Diminished with no appreciable wheezing today. No accessory muscle use Cardiovascular: RRR; no m/r/g. Trace edema Abdomen: Soft, NT, ND. Bowel sounds present GU: Deferred Musculoskeletal: No cyanosis. No contractures Skin: Warm and dry; Has graft on Right LE. No rashes appreciated Neurologic: CN 2-12 grossly intact. No appreciable focal deficits Psychiatric: Normal mood and affect. Intact judgement and insight  Data Reviewed: I have personally reviewed following labs and imaging studies  CBC:  Recent Labs Lab 08/29/17 1400 08/30/17 0609 08/31/17 0518 09/01/17 0342  WBC 18.2* 17.4* 21.0* 16.0*  NEUTROABS 15.0*  --   --  13.7*  HGB 11.0* 10.0* 9.8* 9.8*  HCT 33.8* 30.9* 29.9* 31.1*  MCV 82.8 82.8 82.1 83.4  PLT 419* 459* 474* 546*   Basic  Metabolic Panel:  Recent Labs Lab 08/29/17 1400 08/30/17 0609 08/31/17 0518 09/01/17 0342  NA 124* 125* 124* 129*  K 4.2 4.5 3.9 4.5  CL 84* 88* 89* 88*  CO2 30 29 30  33*  GLUCOSE 111* 136* 151* 135*  BUN 11 13 18  25*  CREATININE 1.05 1.03 1.10 1.12  CALCIUM 8.4* 8.4* 8.1* 8.5*  MG  --   --   --  2.4  PHOS  --   --   --  3.4   GFR: Estimated Creatinine Clearance: 54.9 mL/min (by C-G formula based on SCr of 1.12 mg/dL). Liver Function Tests:  Recent Labs Lab 08/29/17 1400 09/01/17 0342  AST 33 28  ALT 30 30  ALKPHOS 97 109  BILITOT 0.7 0.4  PROT 7.1 6.3*  ALBUMIN 2.3* 2.2*   No results for input(s): LIPASE, AMYLASE in the last 168 hours. No results for input(s): AMMONIA in the last 168 hours. Coagulation Profile: No results for input(s): INR, PROTIME in the last 168 hours. Cardiac Enzymes:  Recent Labs Lab 08/29/17 1833 08/30/17 0034 08/30/17 0609  TROPONINI 0.03* <0.03 0.03*   BNP (last 3 results) No results for input(s): PROBNP in the last 8760 hours. HbA1C: No results for input(s): HGBA1C in the last 72 hours. CBG: No results for input(s): GLUCAP in the last 168 hours. Lipid Profile: No results for input(s): CHOL, HDL, LDLCALC, TRIG, CHOLHDL, LDLDIRECT in the last 72 hours. Thyroid Function Tests: No results for input(s): TSH, T4TOTAL, FREET4, T3FREE, THYROIDAB in the last 72 hours. Anemia Panel: No results for input(s): VITAMINB12, FOLATE, FERRITIN, TIBC, IRON, RETICCTPCT in the last 72 hours. Sepsis Labs: No results for input(s): PROCALCITON, LATICACIDVEN in the last 168 hours.  No results found for this or any previous visit (from the past 240 hour(s)).   Radiology Studies: Dg Chest 1 View  Result Date: 09/01/2017 CLINICAL DATA:  Short of breath.  COPD. EXAM: CHEST 1 VIEW COMPARISON:  08/29/2017 FINDINGS: Upper normal heart size. Stable hyperaeration and interstitial prominence. No pneumothorax. Pleural effusions are not appreciated but there is  no lateral view. IMPRESSION: No active disease. Electronically Signed   By: Marybelle Killings M.D.   On: 09/01/2017 07:34   Scheduled Meds: . amiodarone  100 mg Oral Daily  . atorvastatin  40 mg Oral Daily  . budesonide (PULMICORT) nebulizer solution  0.25 mg Nebulization BID  . carvedilol  3.125 mg Oral BID  . clopidogrel  75 mg Oral Daily  .  enoxaparin (LOVENOX) injection  40 mg Subcutaneous Q24H  . feeding supplement (ENSURE ENLIVE)  237 mL Oral BID BM  . guaiFENesin  1,200 mg Oral BID  . Influenza vac split quadrivalent PF  0.5 mL Intramuscular Tomorrow-1000  . ipratropium-albuterol  3 mL Nebulization BID  . levofloxacin  500 mg Oral Daily  . levothyroxine  25 mcg Oral QAC breakfast  . methylPREDNISolone (SOLU-MEDROL) injection  60 mg Intravenous Q12H  . ranolazine  500 mg Oral BID  . tamsulosin  0.4 mg Oral Daily   Continuous Infusions:   LOS: 2 days   Kerney Elbe, DO [facility] Pager [phone]  If 7PM-7AM, please contact night-coverage [email or web address] Password [facility] 09/01/2017, 6:43 PM

## 2017-09-01 NOTE — Progress Notes (Signed)
Occupational Therapy Treatment Patient Details Name: Jacquees Gongora MRN: [id number] DOB: 14-Jan-1946 Today's Date: 09/01/2017    History of present illness Shyquan Stallbaumer is a 71 y.o. male who presents with respiratory distress secondary to COPD exacerbation, medical noncompliance and mild hyponatremia..   OT comments  Pt really benefits from therapy being functional and practical. He was willing to work with me because we really practiced things that he will have to do when he leaves the hospital . Pt continues to demonstrate significant fatigue quickly and energy conservation education provided. This therapist feels like the patient knows and verbalizes that he needs help, he needs to improve his balance, strength, and safety but he states "I just don't want to do the work and no one can make me" Pt continues to benefit from skilled OT in the acute setting and for safety (fall risk) and potential for rehab reasons should be at SNF level, but likely to refuse and will need HHOT.    Follow Up Recommendations  Home health OT;Supervision/Assistance - 24 hour;Other (comment) (Pt REALLY needs SNF but is currently refusing)    Equipment Recommendations  3 in 1 bedside commode    Recommendations for Other Services      Precautions / Restrictions Precautions Precautions: Fall Precaution Comments: very unsteady on feet.  does report falls in last month or so.  Restrictions Weight Bearing Restrictions: No       Mobility Bed Mobility               General bed mobility comments: Pt sitting OOB in recliner when OT entered  Transfers Overall transfer level: Needs assistance Equipment used: 1 person hand held assist Transfers: Sit to/from Stand Sit to Stand: Min guard         General transfer comment: Fatigues quickly, "you can't tell me how to do what I'm going to do"    Balance Overall balance assessment: Needs assistance;History of Falls Sitting-balance support: Feet  supported Sitting balance-Leahy Scale: Good     Standing balance support: Bilateral upper extremity supported;During functional activity Standing balance-Leahy Scale: Poor Standing balance comment: dependent on external support                           ADL either performed or assessed with clinical judgement   ADL Overall ADL's : Needs assistance/impaired     Grooming: Wash/dry hands;Wash/dry face;Set up;Sitting Grooming Details (indicate cue type and reason): educated Pt on sitting down for ADL and energy conservation strategies. "I feel stronger today" needing less rest breaks from previous PT session per notes                 Toilet Transfer: Minimal assistance;BSC;Stand-pivot Toilet Transfer Details (indicate cue type and reason): simulated this session           General ADL Comments: This OT feels the patient is very self limiting     Vision       Perception     Praxis      Cognition Arousal/Alertness: Awake/alert Behavior During Therapy: WFL for tasks assessed/performed Overall Cognitive Status: Within Functional Limits for tasks assessed                                 General Comments: Talked at length about rehab as a good option while his apt is under construction as he does not want to return to motel. "  I'll just stay with my daughter" there seems to be a disconnect between what he can do, and what he wants to do, and if he does not want to do it, he will not. Pt is self limiting        Exercises     Shoulder Instructions       General Comments      Pertinent Vitals/ Pain       Pain Assessment: No/denies pain  Home Living                                          Prior Functioning/Environment              Frequency  Min 2X/week        Progress Toward Goals  OT Goals(current goals can now be found in the care plan section)  Progress towards OT goals: Progressing toward goals  Acute  Rehab OT Goals Patient Stated Goal: to breathe easier OT Goal Formulation: With patient Time For Goal Achievement: 09/13/17 Potential to Achieve Goals: [place] Discharge plan remains appropriate;Frequency remains appropriate    Co-evaluation                 AM-PAC PT "6 Clicks" Daily Activity     Outcome Measure   Help from another person eating meals?: A Little Help from another person taking care of personal grooming?: A Little Help from another person toileting, which includes using toliet, bedpan, or urinal?: A Little Help from another person bathing (including washing, rinsing, drying)?: A Lot Help from another person to put on and taking off regular upper body clothing?: A Little Help from another person to put on and taking off regular lower body clothing?: A Lot 6 Click Score: 16    End of Session Equipment Utilized During Treatment: Gait belt;Oxygen  OT Visit Diagnosis: Unsteadiness on feet (R26.81);History of falling (Z91.81);Muscle weakness (generalized) (M62.81)   Activity Tolerance Patient limited by fatigue   Patient Left in chair;with call bell/phone within reach   Nurse Communication Mobility status        Time: 3710-6269 OT Time Calculation (min): 24 min  Charges: OT General Charges $OT Visit: 1 Visit OT Treatments $Self Care/Home Management : 8-22 mins $Therapeutic Activity: 8-22 mins  Hulda Humphrey OTR/L [place] 09/01/2017, 5:40 PM

## 2017-09-02 LAB — COMPREHENSIVE METABOLIC PANEL
ALT: 31 U/L (ref 17–63)
AST: 27 U/L (ref 15–41)
Albumin: 2.3 g/dL — ABNORMAL LOW (ref 3.5–5.0)
Alkaline Phosphatase: 84 U/L (ref 38–126)
Anion gap: 5 (ref 5–15)
BUN: 23 mg/dL — ABNORMAL HIGH (ref 6–20)
CHLORIDE: 90 mmol/L — AB (ref 101–111)
CO2: 31 mmol/L (ref 22–32)
Calcium: 8.3 mg/dL — ABNORMAL LOW (ref 8.9–10.3)
Creatinine, Ser: 1.13 mg/dL (ref 0.61–1.24)
Glucose, Bld: 148 mg/dL — ABNORMAL HIGH (ref 65–99)
POTASSIUM: 4.4 mmol/L (ref 3.5–5.1)
SODIUM: 126 mmol/L — AB (ref 135–145)
Total Bilirubin: 0.3 mg/dL (ref 0.3–1.2)
Total Protein: 6.3 g/dL — ABNORMAL LOW (ref 6.5–8.1)

## 2017-09-02 LAB — MAGNESIUM: Magnesium: 2.2 mg/dL (ref 1.7–2.4)

## 2017-09-02 LAB — CBC WITH DIFFERENTIAL/PLATELET
BAND NEUTROPHILS: 1 %
BASOS ABS: 0 10*3/uL (ref 0.0–0.1)
BASOS PCT: 0 %
Blasts: 0 %
EOS PCT: 0 %
Eosinophils Absolute: 0 10*3/uL (ref 0.0–0.7)
HCT: 31 % — ABNORMAL LOW (ref 39.0–52.0)
Hemoglobin: 10.3 g/dL — ABNORMAL LOW (ref 13.0–17.0)
LYMPHS ABS: 1.5 10*3/uL (ref 0.7–4.0)
Lymphocytes Relative: 10 %
MCH: 27.3 pg (ref 26.0–34.0)
MCHC: 33.2 g/dL (ref 30.0–36.0)
MCV: 82.2 fL (ref 78.0–100.0)
METAMYELOCYTES PCT: 1 %
MONO ABS: 1.2 10*3/uL — AB (ref 0.1–1.0)
MONOS PCT: 8 %
MYELOCYTES: 0 %
NEUTROS ABS: 12.2 10*3/uL — AB (ref 1.7–7.7)
Neutrophils Relative %: 80 %
Other: 0 %
PLATELETS: 615 10*3/uL — AB (ref 150–400)
Promyelocytes Absolute: 0 %
RBC: 3.77 MIL/uL — AB (ref 4.22–5.81)
RDW: 16.7 % — AB (ref 11.5–15.5)
WBC: 14.9 10*3/uL — AB (ref 4.0–10.5)
nRBC: 0 /100 WBC

## 2017-09-02 LAB — OSMOLALITY, URINE: OSMOLALITY UR: 587 mosm/kg (ref 300–900)

## 2017-09-02 LAB — PHOSPHORUS: PHOSPHORUS: 2.7 mg/dL (ref 2.5–4.6)

## 2017-09-02 LAB — SODIUM, URINE, RANDOM: SODIUM UR: 25 mmol/L

## 2017-09-02 NOTE — Care Management Note (Signed)
Case Management Note  Patient Details  Name: Douglas Mann MRN: [id number] Date of Birth: [birth year]  Subjective/Objective:    CM following for progression and d/c planning.                 Action/Plan: 09/02/2017 Spoke with pt daughter re d/c plan for this pt. Pt has advised this CM that his daughter handles all his health care needs. Pt daughter, Ms Lala Lund, the family has arranged 24 hr care in the pt home with a CNA. She states that they do not wish to use [facility] services.  Ms Franchot Mimes also states that this pt has all the DME that he will need and that she will assure that it is in the home. She also has his oxygen tank in the car ready to pick him up from the hospital when he is discharged. No further needs identified.   Expected Discharge Date:    09/03/2017              Expected Discharge Plan:  Home/Self Care  In-House Referral:  NA  Discharge planning Services  CM Consult  Post Acute Care Choice:  NA Choice offered to:  Adult Children  DME Arranged:  N/A DME Agency:  NA  HH Arranged:  NA HH Agency:  NA  Status of Service:  Completed, signed off  If discussed at [place] of Stay Meetings, dates discussed:    Additional Comments:  Adron Bene, RN 09/02/2017, 4:14 PM

## 2017-09-02 NOTE — Progress Notes (Signed)
PROGRESS NOTE    Douglas Mann  HYW:[id number] DOB: April 03, 1946 DOA: 08/29/2017 PCP: [name], MD   Brief Narrative:  Douglas Mann a very pleasant 71 y.o.malewith medical history significant for COPD on 4 L at home, hypertension, chronic kidney disease, MI, CHF, hypothyroidism, who presented to th Emergency Department with a chief complaint worsening shortness of breath. Evaluation in the ER per admitting MD revealed respiratory distress likely related to COPD exacerbation. Patient has been diuresed with IV Lasix 40 mg daily for the last 2 days and will repeat again as patient has volume overload and some degree of Hypervolemic Hyponatremia. PT Evaluated and recommending SNF but patient refusing and likely to go home with [place] but per CM patient's family does not wish to use [facility] services. Discussed case with Nephrology briefly because patient's Sodium dropped. Per my discussion with Dr. Hollie Salk, she recommended continuing to watch Sodium and not give lasix today and if continues to worse will formally consult in AM.   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 EF 5 -10% by LV gram - following non-STEMI and ventricular tachycardia   Normocytic anemia   Chronic systolic heart failure (HCC)   CKD (chronic kidney disease), stage III   Hypothyroidism   Benign prostatic hyperplasia   Hyponatremia   Acute respiratory distress   Leukocytosis   Constipation   COPD exacerbation (HCC)   Severe protein-calorie malnutrition (HCC)   Thrombocytosis (HCC)  Acute Respiratory Distress multifactorial due to COPD exacerbation but also a component of fluid overload, improved  -Patient wheezing on admission, with cough and leukocytosis, he was started on Levaquin and will  continue -Wheezing has improved.  -Home Baseline of 4 Liters  -C/w IV Solumedrol 40 mg q12h and DuoNeb Nebulizers TID -C/w Pulmicort 0.25 mg po BID -C/w Flutter Valve, Incentive Spirometry, and Increased Guaifenesin to 1200 mg po BID  -He also had evidence of mild fluid overload with elevated BNP, chest x-ray showing trace effusions and lower extremity edema.  He was given IV fluids on the night of admission. Given Lasix last 2 days and will repeat -Repeat CXR this AM showed Upper normal heart size. Stable hyperaeration and interstitial prominence. No pneumothorax. Pleural effusions are not appreciated but there is no lateral view; Impression read as no active disease.  -If not improving consider Pulmonary Consultation and obtain CTA of Chest to r/o PE   Hyponatremia.  -Sodium level 124on admission; Improved to 129 today after Administration of Lasix on 9/13 but dropped to 126 this AM; Likely Hypervolemic Hyponatremia from CHF vs SIADH; Nephrology recommended just observing Sodium overnight  -Serum Osmols was 271; Urine Osmol was 334; Will Repeat Today -Hold Lasix for today and monitor Sodium in AM; If worsening will consult Nephrology in AM -Continue to Monitor CMP's and if not improving will discuss with Nephrology in AM   Acute on Chronic Systolic and Diastolic Heart failure.  -Last echo 2015 with an EF of 15%. -Patient with mild fluid overload with lower extremity edema, Given IV Lasix daily for last 3 days. Will not give Lasix today -Continue Coreg and Amiodarone -Daily weights; Patient is down 1 lb.  -Strict Intake and output; Patient is -85.2442 Liters -Echocardiogram done on admission showed that the EF has improved to 48-54%, grade 1 diastolic dysfunction -  Continue to Monitor Volume Status   Hypertension -Blood pressure controlled this morning, Continue Coreg, he was hypotensive on admission and his other medications are currently on hold.  -Resume other medications slowly (Both  ACE and ARB on MAR and will need to clarify which one he takes)   Chronic Kidney Disease Stage II.  -Creatinine 1.05 on admission, 1.13 this morning. Repeat Lasix in AM  -Continue to Monitor Renal Fxn   CAD s/p Stenting  -Cardiac cath 2015. Status post stent at [facility].  -Home medications include aspirin Plavix statin. No chest pain.  -Cardiac enzymes essentially flat at 0.03, <0.03, 0.03 -C/w Atorvastatin 40 mg po Daily, Carvedilol 3.125 mg po BID, and Ranolazine 500 mg po BID; ACE/ARB Held on Admission   Hypothyroidism -TSH was 0.335 -C/w Levothyroxine 25 mcg po Daily   HLD -C/w Atorvastatin 40 mg po Daily  -Fish Oil was discontinued on Admission.   Severe Protein Calorie Malnutrition in the Context of Chronic Illness -Nutritionist Consulted -C/w Ensure Enlive po BID -C/w Dysphagia 3 Diet.   BPH -C/w Tamsulosin 0.4 mg po Daily   Leukocytosis -Likely from COPD Exacerbation and IV Steroid Demargination -WBC went from 17.4 -> 21.0 -> 16.0 -> 14.9 -No Cx obtained on Admission; C/w Empiric Levofloxacin -Continue to Monitor for S/Sx of Infection: If patient spikes a temperature will obtain Blood Cx -If WBC continues to worsen will broaden Abx Coveraage -Urine was unremarkable  -Repeat CMP in AM  Thrombocytosis -Likely reactive vs Infectious Etiology or from IDA -Patient's Platelet Count went from 459 -> 474 -> 546 -> 615 -Continue to Monitor and Repeat CBC in AM   Normocytic Anemia -Likely in the setting of Chronic Disease -Patient's Hb/Hct Stable at 10.3/31.0 -Continue to Monitor for S/Sx of Bleeding -Repeat CBC in AM   DVT prophylaxis: Enoxaparin 40 mg sq q24h Code Status: FULL CODE Family Communication: No family present at beside Disposition Plan: SNF but patient refusing so will go home with Home Health when medically stable and anticipate D/C in AM  Consultants:  Discussed Case with Dr. Hollie Salk of Nephrology   Procedures: ECHOCARDIOGRAM Study  Conclusions  - Left ventricle: The cavity size was normal. Wall thickness was   increased in a pattern of mild LVH. Systolic function was   vigorous. The estimated ejection fraction was in the range of 65%   to 70%. Doppler parameters are consistent with abnormal left   ventricular relaxation (grade 1 diastolic dysfunction).   Antimicrobials:  Anti-infectives    Start     Dose/Rate Route Frequency Ordered Stop   08/29/17 1730  levofloxacin (LEVAQUIN) tablet 500 mg     500 mg Oral Daily 08/29/17 1652       Subjective: Seen and examined and stated he felt stronger. Breathing was the same. No lightheadedness. States he has some wheezing but when he coughs up sputum improves. No other complaints or concerns and feels like he is getting back to his baseline.   Objective: Vitals:   09/01/17 2131 09/02/17 0513 09/02/17 0733 09/02/17 1059  BP: (!) 134/58 (!) 144/63  113/61  Pulse: 80 81 77 92  Resp: 18 18 18 17   Temp: 98.1 F (36.7 C) 98 F (36.7 C)  97.7 F (36.5 C)  TempSrc: Oral Oral  Oral  SpO2: 97% 98% 98% 95%  Weight: 70.3 kg (155 lb)     Height:        Intake/Output Summary (Last 24 hours) at 09/02/17 1731 Last data filed at 09/02/17 1730  Gross per 24 hour  Intake              660 ml  Output              800 ml  Net             -140 ml   Filed Weights   08/30/17 2142 08/31/17 2147 09/01/17 2131  Weight: 71.2 kg (156 lb 15.5 oz) 70.8 kg (156 lb) 70.3 kg (155 lb)   Examination: Physical Exam:  Constitutional: Pleasant 71 yo Caucasian male in NAD Eyes: Sclerae Anicteric. Conjunctivae non-injected ENMT: Grossly normal hearing. Mucous membranes appear moist Neck: Supple with no JVD Respiratory: Diminished bilaterally with minimal wheezing. No rhonchi or crackles; Patient was not tachypenic but was wearing supplemental O2 via Sac Cardiovascular: RRR; S1, S2; Trace LE edema Abdomen: Soft, NT, ND. Bowel sounds present GU: Deferred Musculoskeletal: No Clubbing and No  cyanosis Skin: Warm and Dry; Has Right Leg Skin graft Neurologic: CN 2-12 grossly intact. No appreciable focal deficits Psychiatric: Pleasant mood and affect. Intact judgement and insight  Data Reviewed: I have personally reviewed following labs and imaging studies  CBC:  Recent Labs Lab 08/29/17 1400 08/30/17 0609 08/31/17 0518 09/01/17 0342 09/02/17 0510  WBC 18.2* 17.4* 21.0* 16.0* 14.9*  NEUTROABS 15.0*  --   --  13.7* 12.2*  HGB 11.0* 10.0* 9.8* 9.8* 10.3*  HCT 33.8* 30.9* 29.9* 31.1* 31.0*  MCV 82.8 82.8 82.1 83.4 82.2  PLT 419* 459* 474* 546* 008*   Basic Metabolic Panel:  Recent Labs Lab 08/29/17 1400 08/30/17 0609 08/31/17 0518 09/01/17 0342 09/02/17 0510  NA 124* 125* 124* 129* 126*  K 4.2 4.5 3.9 4.5 4.4  CL 84* 88* 89* 88* 90*  CO2 30 29 30  33* 31  GLUCOSE 111* 136* 151* 135* 148*  BUN 11 13 18  25* 23*  CREATININE 1.05 1.03 1.10 1.12 1.13  CALCIUM 8.4* 8.4* 8.1* 8.5* 8.3*  MG  --   --   --  2.4 2.2  PHOS  --   --   --  3.4 2.7   GFR: Estimated Creatinine Clearance: 54.4 mL/min (by C-G formula based on SCr of 1.13 mg/dL). Liver Function Tests:  Recent Labs Lab 08/29/17 1400 09/01/17 0342 09/02/17 0510  AST 33 28 27  ALT 30 30 31   ALKPHOS 97 109 84  BILITOT 0.7 0.4 0.3  PROT 7.1 6.3* 6.3*  ALBUMIN 2.3* 2.2* 2.3*   No results for input(s): LIPASE, AMYLASE in the last 168 hours. No results for input(s): AMMONIA in the last 168 hours. Coagulation Profile: No results for input(s): INR, PROTIME in the last 168 hours. Cardiac Enzymes:  Recent Labs Lab 08/29/17 1833 08/30/17 0034 08/30/17 0609  TROPONINI 0.03* <0.03 0.03*   BNP (last 3 results) No results for input(s): PROBNP in the last 8760 hours. HbA1C: No results for input(s): HGBA1C in the last 72 hours. CBG: No results for input(s): GLUCAP in the last 168 hours. Lipid Profile: No results for input(s): CHOL, HDL, LDLCALC, TRIG, CHOLHDL, LDLDIRECT in the last 72 hours. Thyroid  Function Tests: No results for input(s): TSH, T4TOTAL, FREET4, T3FREE, THYROIDAB in the last 72 hours. Anemia Panel: No results for input(s): VITAMINB12, FOLATE, FERRITIN, TIBC, IRON, RETICCTPCT in the last 72 hours. Sepsis Labs: No results for input(s): PROCALCITON, LATICACIDVEN in the last 168 hours.  No results found for this or any previous visit (from the past 240 hour(s)).   Radiology Studies: Dg Chest 1 View  Result Date: 09/01/2017 CLINICAL DATA:  Short of breath.  COPD. EXAM: CHEST 1 VIEW COMPARISON:  08/29/2017 FINDINGS: Upper normal heart size. Stable hyperaeration and interstitial prominence. No pneumothorax. Pleural effusions are not appreciated but there is no lateral view. IMPRESSION: No active disease. Electronically Signed   By: Marybelle Killings M.D.   On: 09/01/2017 07:34   Scheduled Meds: . amiodarone  100 mg Oral Daily  . atorvastatin  40 mg Oral Daily  . budesonide (PULMICORT) nebulizer solution  0.25 mg Nebulization BID  . carvedilol  3.125 mg Oral BID  . clopidogrel  75 mg Oral Daily  . enoxaparin (LOVENOX) injection  40 mg Subcutaneous Q24H  . feeding supplement (ENSURE ENLIVE)  237 mL Oral BID BM  . guaiFENesin  1,200 mg Oral BID  . Influenza vac split quadrivalent PF  0.5 mL Intramuscular Tomorrow-1000  . ipratropium-albuterol  3 mL Nebulization BID  . levofloxacin  500 mg Oral Daily  . levothyroxine  25 mcg Oral QAC breakfast  . methylPREDNISolone (SOLU-MEDROL) injection  40 mg Intravenous Q12H  . ranolazine  500 mg Oral BID  . tamsulosin  0.4 mg Oral Daily   Continuous Infusions:   LOS: 3 days   Kerney Elbe, DO [facility] Pager [phone]  If 7PM-7AM, please contact night-coverage [email or web address] Password [facility] 09/02/2017, 5:31 PM

## 2017-09-03 DIAGNOSIS — Z23 Encounter for immunization: Secondary | ICD-10-CM | POA: Diagnosis not present

## 2017-09-03 LAB — CBC WITH DIFFERENTIAL/PLATELET
Basophils Absolute: 0 10*3/uL (ref 0.0–0.1)
Basophils Relative: 0 %
EOS ABS: 0 10*3/uL (ref 0.0–0.7)
Eosinophils Relative: 0 %
HCT: 31 % — ABNORMAL LOW (ref 39.0–52.0)
Hemoglobin: 10.5 g/dL — ABNORMAL LOW (ref 13.0–17.0)
LYMPHS ABS: 1.9 10*3/uL (ref 0.7–4.0)
Lymphocytes Relative: 11 %
MCH: 27.8 pg (ref 26.0–34.0)
MCHC: 33.9 g/dL (ref 30.0–36.0)
MCV: 82 fL (ref 78.0–100.0)
MONOS PCT: 8 %
Monocytes Absolute: 1.4 10*3/uL — ABNORMAL HIGH (ref 0.1–1.0)
NEUTROS ABS: 13.7 10*3/uL — AB (ref 1.7–7.7)
Neutrophils Relative %: 81 %
PLATELETS: 631 10*3/uL — AB (ref 150–400)
RBC: 3.78 MIL/uL — AB (ref 4.22–5.81)
RDW: 16.7 % — AB (ref 11.5–15.5)
WBC: 17 10*3/uL — AB (ref 4.0–10.5)

## 2017-09-03 LAB — COMPREHENSIVE METABOLIC PANEL
ALT: 30 U/L (ref 17–63)
AST: 23 U/L (ref 15–41)
Albumin: 2.3 g/dL — ABNORMAL LOW (ref 3.5–5.0)
Alkaline Phosphatase: 82 U/L (ref 38–126)
Anion gap: 4 — ABNORMAL LOW (ref 5–15)
BUN: 25 mg/dL — ABNORMAL HIGH (ref 6–20)
CHLORIDE: 92 mmol/L — AB (ref 101–111)
CO2: 33 mmol/L — AB (ref 22–32)
CREATININE: 1.15 mg/dL (ref 0.61–1.24)
Calcium: 8.6 mg/dL — ABNORMAL LOW (ref 8.9–10.3)
Glucose, Bld: 127 mg/dL — ABNORMAL HIGH (ref 65–99)
POTASSIUM: 4.7 mmol/L (ref 3.5–5.1)
SODIUM: 129 mmol/L — AB (ref 135–145)
Total Bilirubin: 0.4 mg/dL (ref 0.3–1.2)
Total Protein: 5.9 g/dL — ABNORMAL LOW (ref 6.5–8.1)

## 2017-09-03 LAB — MAGNESIUM: MAGNESIUM: 2.2 mg/dL (ref 1.7–2.4)

## 2017-09-03 LAB — PHOSPHORUS: PHOSPHORUS: 2.8 mg/dL (ref 2.5–4.6)

## 2017-09-03 MED ORDER — PREDNISONE 10 MG (21) PO TBPK: ORAL_TABLET | ORAL | 0 refills | Status: DC

## 2017-09-03 MED ORDER — GUAIFENESIN ER 600 MG PO TB12: 1200.0000 mg | ORAL_TABLET | Freq: Two times a day (BID) | ORAL | 0 refills | Status: DC

## 2017-09-03 MED ORDER — ENSURE ENLIVE PO LIQD: 237.0000 mL | Freq: Two times a day (BID) | ORAL | 12 refills | Status: AC

## 2017-09-03 NOTE — Progress Notes (Signed)
Patient discharged home per MD. Discharge instructions reviewed with patient and reinforced with daughter prior to discharge. Carefully reviewed instructions about how to take prednisone taper. Flu shot administered prior to discharge. Patient escorted out via wheelchair. Bartholomew Crews, RN

## 2017-09-03 NOTE — Discharge Summary (Signed)
Physician Discharge Summary  Douglas Mann VFI:[id number] DOB: 1946-05-31 DOA: 08/29/2017  PCP: [name], MD  Admit date: 08/29/2017 Discharge date: 09/03/2017  Admitted From: Home Disposition: Home as patient refused [place] and SNF  Recommendations for Outpatient Follow-up:  1. Follow up with PCP in 1-2 weeks 2. Follow up with Pulmonary within 1 week 3. Follow up with Cardiology within 1 week 4. Please obtain CMP/CBC, Mag, Phos in one week 5. Please follow up on the following pending results:  Home Health: No Patient refused  Equipment/Devices: None    Discharge Condition: Stable CODE STATUS: FULL CODE Diet recommendation: Dysphagia 3 Heart Healthy Diet  Brief/Interim Summary: Douglas Rockis a very pleasant 71 y.o.malewith medical history significant for COPD on 4 L at home, hypertension, chronic kidney disease, MI, CHF, hypothyroidism, who presented to th Emergency Department with a chief complaint worsening shortness of breath. Evaluation in the ER per admitting MD revealedrespiratory distress likely related to COPD exacerbation. Patient has been diuresed with IV Lasix 40 mg daily for the last 2 days and will repeat again as patient has volume overload and some degree of Hypervolemic Hyponatremia. PT Evaluated and recommending SNF but patient refusing and likely to go home with [place] but per CM patient's family does not wish to use [facility] services. Discussed case with Nephrology briefly because patient's Sodium dropped. Per my discussion with Dr. Hollie Salk, she recommended continuing to watch Sodium and not give lasix yesterday. Sodium improved to 129. He was deemed medically stable to D/C and will need to follow up with PCP, Cardiology, Pulmonary and will need to follow up with a Nephrologist as an outpatient if Sodium continues to remain low.   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 EF 5 -10% by LV gram - following non-STEMI and ventricular tachycardia   Normocytic anemia   Chronic systolic heart failure (HCC)   CKD (chronic kidney disease), stage III   Hypothyroidism   Benign prostatic hyperplasia   Hyponatremia   Acute respiratory distress   Leukocytosis   Constipation   COPD exacerbation (HCC)   Severe protein-calorie malnutrition (HCC)   Thrombocytosis (HCC)  Acute Respiratory Distress multifactorial due to COPD exacerbation but also a component of fluid overload, improved  -Patient wheezing on admission, with cough and leukocytosis, he was started on Levaquin and will continue -Wheezing has improved.  -On Home Baseline of 4 Liters  -Stopped IV Solumedrol 40 mg q12h and DuoNeb Nebulizers TID and transitioned to Steroid Taper. Patient did not want Home Nebulizer -Was on Pulmicort 0.25 mg po BID -C/w Flutter Valve, Incentive Spirometry, and Increased Guaifenesin to 1200 mg po BID  -He also had evidence of mild fluid overload with elevated BNP, chest x-ray showing trace effusions and lower extremity edema. He was given IV fluids on the night of admission. Given Lasix last 2 days and will repeat -Repeat CXR this AM showed Upper normal heart size. Stable hyperaeration and interstitial prominence. No pneumothorax. Pleural effusions are not appreciated but there is no lateral view; Impression read as no active disease.  -Follow up with PCP and Pulmonary as an outpatient   Hyponatremia.  -Sodium level 124on admission; Improved to 129 Likely Hypervolemic Hyponatremia from CHF vs SIADH; Nephrology recommended just observing Sodium overnight and now stable -Serum Osmols was 271; Urine  Osmol was 334; Will Repeat Today -Hold Lasix for today and monitor Sodium in AM; If worsening will consult Nephrology in AM -Continue to Monitor CMP's as an  outpatient and Have Nephrology Referral as an outpatient by PCP if deemed Necessary   Acute on Chronic Systolic and Diastolic Heart failure.  -Last echo 2015 with an EF of 15%. -Patient with mild fluid overload with lower extremity edema, Given IV Lasix daily for last 3 days. Resume Home Lasix -Continue Coreg and Amiodarone -Daily weights; Patient is down 2 lb.  -Strict Intake and output; Patient is -2.7342 Liters -Echocardiogramdone on admission showed that the EF has improved to 98-33%, grade 1 diastolic dysfunction -Continue to Monitor Volume Status and follow up with Cardiology as an outpatient   Hypertension -Blood pressure controlled this morning, Continue Coreg, he was hypotensive on admission and his other medications are currently on hold. -Resume other medications slowly (Both ACE and ARB on MAR and attempted to clarify which one he takes so will D/C on ACE)   Chronic Kidney Disease Stage II.  -Creatinine 1.05 on admission, 1.15 this morning. Repeat Lasix in AM  -Continue to Monitor Renal Fxn as an outpatient -PCP may consider Nephrology referral   CAD s/p Stenting  -Cardiac cath 2015. Status post stent at [facility].  -Home medications include aspirin Plavix statin. No chest pain.  -Cardiac enzymes essentially flat at 0.03, <0.03, 0.03 -C/w Atorvastatin 40 mg po Daily, Carvedilol 3.125 mg po BID, and Ranolazine 500 mg po BID; ACE resumed   Hypothyroidism -TSH was 0.335 -C/w Levothyroxine 25 mcg po Daily   HLD -C/w Atorvastatin 40 mg po Daily  -Resume Home Fish Oil   Severe Protein Calorie Malnutrition in the Context of Chronic Illness -Nutritionist Consulted -C/w Ensure Enlive po BID as an outpatient  -C/w Dysphagia 3 Diet.   BPH -C/w Tamsulosin 0.4 mg po Daily   Leukocytosis -Likely from COPD Exacerbation and IV Steroid Demargination -WBC went from 17.4 -> 21.0 -> 16.0 -> 14.9 -> 17.0 -No Cx obtained on Admission; C/w Empiric Levofloxacin -Continue to  Monitor for S/Sx of Infection: If patient spikes a temperature will obtain Blood Cx -Urine was unremarkable  -Repeat CMP as an outpatient  Thrombocytosis -Likely reactive vs Infectious Etiology or from IDA or Sterioid Use and favor Steroid Use as cause -Patient's Platelet Count went from 459 -> 474 -> 546 -> 615 -> 631 -Continue to Monitor and Repeat CBC as an outpatient   Normocytic Anemia -Likely in the setting of Chronic Disease -Patient's Hb/Hct Stable at 10.5/31.0 -Continue to Monitor for S/Sx of Bleeding -Repeat CBC as an outpatient   Discharge Instructions  Discharge Instructions    Call MD for:  difficulty breathing, headache or visual disturbances    Complete by:  As directed    Call MD for:  extreme fatigue    Complete by:  As directed    Call MD for:  hives    Complete by:  As directed    Call MD for:  persistant dizziness or light-headedness    Complete by:  As directed    Call MD for:  persistant nausea and vomiting    Complete by:  As directed    Call MD for:  redness, tenderness, or signs of infection (pain, swelling, redness, odor or green/yellow discharge around incision site)    Complete by:  As directed    Call MD for:  severe uncontrolled pain    Complete by:  As directed  Call MD for:  temperature >100.4    Complete by:  As directed    Diet - low sodium heart healthy    Complete by:  As directed    Discharge instructions    Complete by:  As directed    Follow up with Pulmonary, Cardiology and PCP within 1 week. Take all medications as prescribed. If symptoms change or worsen please return to the ED for evaluation.   Increase activity slowly    Complete by:  As directed      Allergies as of 09/03/2017   No Known Allergies     Medication List    STOP taking these medications   losartan 50 MG tablet Commonly known as:  COZAAR     TAKE these medications   albuterol 108 (90 Base) MCG/ACT inhaler Commonly known as:  VENTOLIN HFA Inhale 2  puffs into the lungs every 6 (six) hours as needed. What changed:  reasons to take this   amiodarone 200 MG tablet Commonly known as:  PACERONE TAKE 1/2 TABLET EVERY DAY What changed:  See the new instructions.   aspirin 81 MG tablet Take 81 mg by mouth daily as needed (for chest pain).   atorvastatin 40 MG tablet Commonly known as:  LIPITOR Take 1 tablet (40 mg total) by mouth daily.   carvedilol 3.125 MG tablet Commonly known as:  COREG Take 1 tablet (3.125 mg total) by mouth 2 (two) times daily.   clopidogrel 75 MG tablet Commonly known as:  PLAVIX Take 1 tablet (75 mg total) by mouth daily.   feeding supplement (ENSURE ENLIVE) Liqd Take 237 mLs by mouth 2 (two) times daily between meals.   fish oil-omega-3 fatty acids 1000 MG capsule Take 1 g by mouth daily.   Fluticasone-Salmeterol 500-50 MCG/DOSE Aepb Commonly known as:  ADVAIR Inhale 1 puff into the lungs 2 (two) times daily.   furosemide 40 MG tablet Commonly known as:  LASIX Take 1 tablet (40 mg total) by mouth as needed.   guaiFENesin 600 MG 12 hr tablet Commonly known as:  MUCINEX Take 2 tablets (1,200 mg total) by mouth 2 (two) times daily.   hydrocortisone 2.5 % rectal cream Commonly known as:  ANUSOL-HC apply rectally twice a day if needed for HEMORRHOIDS or itching   levothyroxine 25 MCG tablet Commonly known as:  SYNTHROID, LEVOTHROID TAKE 1 TABLET ONE TIME DAILY (NEED MD APPOINTMENT)   lisinopril 5 MG tablet Commonly known as:  PRINIVIL,ZESTRIL Take 1 tablet (5 mg total) by mouth daily.   predniSONE 10 MG (21) Tbpk tablet Commonly known as:  STERAPRED UNI-PAK 21 TAB Take 6 pills Day 1, 5 Pills Day 2, 4 Pills Day 3, 3 Pills Day 4, 2 Pills Day 5, 1 Pill Day 6 and stop   RANEXA 500 MG 12 hr tablet Generic drug:  ranolazine TAKE 1 TABLET TWICE DAILY   tamsulosin 0.4 MG Caps capsule Commonly known as:  FLOMAX TAKE 1 CAPSULE EVERY DAY   triamcinolone cream 0.1 % Commonly known as:   KENALOG Apply 1 application topically 2 (two) times daily as needed. Compound 1:1 with Eucerin and apply to affected skin bid prn What changed:  when to take this  additional instructions            Discharge Care Instructions        Start     Ordered   09/03/17 0000  feeding supplement, ENSURE ENLIVE, (ENSURE ENLIVE) LIQD  2 times daily between meals  09/03/17 1143   09/03/17 0000  guaiFENesin (MUCINEX) 600 MG 12 hr tablet  2 times daily     09/03/17 1143   09/03/17 0000  predniSONE (STERAPRED UNI-PAK 21 TAB) 10 MG (21) TBPK tablet     09/03/17 1143   09/03/17 0000  Increase activity slowly     09/03/17 1143   09/03/17 0000  Diet - low sodium heart healthy     09/03/17 1143   09/03/17 0000  Discharge instructions    Comments:  Follow up with Pulmonary, Cardiology and PCP within 1 week. Take all medications as prescribed. If symptoms change or worsen please return to the ED for evaluation.   09/03/17 1143   09/03/17 0000  Call MD for:  temperature >100.4     09/03/17 1143   09/03/17 0000  Call MD for:  persistant nausea and vomiting     09/03/17 1143   09/03/17 0000  Call MD for:  severe uncontrolled pain     09/03/17 1143   09/03/17 0000  Call MD for:  redness, tenderness, or signs of infection (pain, swelling, redness, odor or green/yellow discharge around incision site)     09/03/17 1143   09/03/17 0000  Call MD for:  difficulty breathing, headache or visual disturbances     09/03/17 1143   09/03/17 0000  Call MD for:  extreme fatigue     09/03/17 1143   09/03/17 0000  Call MD for:  hives     09/03/17 1143   09/03/17 0000  Call MD for:  persistant dizziness or light-headedness     09/03/17 1143      No Known Allergies  Consultations:  None   Procedures/Studies: Dg Chest 1 View  Result Date: 09/01/2017 CLINICAL DATA:  Short of breath.  COPD. EXAM: CHEST 1 VIEW COMPARISON:  08/29/2017 FINDINGS: Upper normal heart size. Stable hyperaeration and  interstitial prominence. No pneumothorax. Pleural effusions are not appreciated but there is no lateral view. IMPRESSION: No active disease. Electronically Signed   By: Marybelle Killings M.D.   On: 09/01/2017 07:34   Dg Chest 2 View  Result Date: 08/29/2017 CLINICAL DATA:  Dyspnea and productive cough EXAM: CHEST  2 VIEW COMPARISON:  08/23/2014 FINDINGS: Heart is top-normal in size. No aortic aneurysm. Mild emphysematous hyperinflation of the lungs bilaterally with minimal atelectasis at each lung base left slightly greater than right. No pneumonic consolidation or overt pulmonary edema. Minimal blunting of the posterior costophrenic angles bilaterally consistent with trace pleural effusions, pleural blunting due to hyperinflated lungs or pleural thickening. No acute nor suspicious osseous abnormality. IMPRESSION: 1. Emphysematous hyperinflation of the lungs. 2. Slight blunting of the posterior costophrenic angles may reflect pleural blunting due to hyperinflated lungs, trace effusions with minimal pleural thickening. Electronically Signed   By: Ashley Royalty M.D.   On: 08/29/2017 15:19    ECHOCARDIOGRAM 9/10 Study Conclusions  - Left ventricle: The cavity size was normal. Wall thickness was   increased in a pattern of mild LVH. Systolic function was   vigorous. The estimated ejection fraction was in the range of 65%   to 70%. Doppler parameters are consistent with abnormal left   ventricular relaxation (grade 1 diastolic dysfunction).  Subjective: Seen and examined bedside and was doing well. Had no complaints and states breathing was back to baseline. No other concerns and still refusing SNF and did not want [place].   Discharge Exam: Vitals:   09/03/17 0915 09/03/17 1000  BP:  Marland Kitchen)  136/49  Pulse:  88  Resp:  18  Temp:  97.9 F (36.6 C)  SpO2: 99% 98%   Vitals:   09/02/17 2205 09/03/17 0529 09/03/17 0915 09/03/17 1000  BP: 125/67 135/61  (!) 136/49  Pulse: 90 85  88  Resp: 18  19  18   Temp: 98.3 F (36.8 C) 98.4 F (36.9 C)  97.9 F (36.6 C)  TempSrc: Oral Oral  Oral  SpO2: 98% 100% 99% 98%  Weight:      Height:       General: Pt is alert, awake, not in acute distress Cardiovascular: RRR, S1/S2 +, no rubs, no gallops Respiratory: Diminished bilaterally with some wheezing, no rhonchi Abdominal: Soft, NT, ND, bowel sounds + Extremities: Trace edema, no cyanosis  The results of significant diagnostics from this hospitalization (including imaging, microbiology, ancillary and laboratory) are listed below for reference.    Microbiology: No results found for this or any previous visit (from the past 240 hour(s)).   Labs: BNP (last 3 results)  Recent Labs  08/29/17 1400  BNP 371.0*   Basic Metabolic Panel:  Recent Labs Lab 08/30/17 0609 08/31/17 0518 09/01/17 0342 09/02/17 0510 09/03/17 0358  NA 125* 124* 129* 126* 129*  K 4.5 3.9 4.5 4.4 4.7  CL 88* 89* 88* 90* 92*  CO2 29 30 33* 31 33*  GLUCOSE 136* 151* 135* 148* 127*  BUN 13 18 25* 23* 25*  CREATININE 1.03 1.10 1.12 1.13 1.15  CALCIUM 8.4* 8.1* 8.5* 8.3* 8.6*  MG  --   --  2.4 2.2 2.2  PHOS  --   --  3.4 2.7 2.8   Liver Function Tests:  Recent Labs Lab 08/29/17 1400 09/01/17 0342 09/02/17 0510 09/03/17 0358  AST 33 28 27 23   ALT 30 30 31 30   ALKPHOS 97 109 84 82  BILITOT 0.7 0.4 0.3 0.4  PROT 7.1 6.3* 6.3* 5.9*  ALBUMIN 2.3* 2.2* 2.3* 2.3*   No results for input(s): LIPASE, AMYLASE in the last 168 hours. No results for input(s): AMMONIA in the last 168 hours. CBC:  Recent Labs Lab 08/29/17 1400 08/30/17 0609 08/31/17 0518 09/01/17 0342 09/02/17 0510 09/03/17 0358  WBC 18.2* 17.4* 21.0* 16.0* 14.9* 17.0*  NEUTROABS 15.0*  --   --  13.7* 12.2* 13.7*  HGB 11.0* 10.0* 9.8* 9.8* 10.3* 10.5*  HCT 33.8* 30.9* 29.9* 31.1* 31.0* 31.0*  MCV 82.8 82.8 82.1 83.4 82.2 82.0  PLT 419* 459* 474* 546* 615* 631*   Cardiac Enzymes:  Recent Labs Lab 08/29/17 1833  08/30/17 0034 08/30/17 0609  TROPONINI 0.03* <0.03 0.03*   BNP: Invalid input(s): POCBNP CBG: No results for input(s): GLUCAP in the last 168 hours. D-Dimer No results for input(s): DDIMER in the last 72 hours. Hgb A1c No results for input(s): HGBA1C in the last 72 hours. Lipid Profile No results for input(s): CHOL, HDL, LDLCALC, TRIG, CHOLHDL, LDLDIRECT in the last 72 hours. Thyroid function studies No results for input(s): TSH, T4TOTAL, T3FREE, THYROIDAB in the last 72 hours.  Invalid input(s): FREET3 Anemia work up No results for input(s): VITAMINB12, FOLATE, FERRITIN, TIBC, IRON, RETICCTPCT in the last 72 hours. Urinalysis    Component Value Date/Time   COLORURINE YELLOW 08/29/2017 2319   APPEARANCEUR CLEAR 08/29/2017 2319   LABSPEC 1.010 08/29/2017 2319   PHURINE 5.0 08/29/2017 Manuel Garcia 08/29/2017 2319   HGBUR NEGATIVE 08/29/2017 2319   HGBUR negative 12/23/2010 [place] 08/29/2017 2319   KETONESUR  NEGATIVE 08/29/2017 2319   PROTEINUR NEGATIVE 08/29/2017 2319   UROBILINOGEN 0.2 12/23/2010 1050   NITRITE NEGATIVE 08/29/2017 2319   LEUKOCYTESUR NEGATIVE 08/29/2017 2319   Sepsis Labs Invalid input(s): PROCALCITONIN,  WBC,  LACTICIDVEN Microbiology No results found for this or any previous visit (from the past 240 hour(s)).  Time coordinating discharge: 35 minutes  SIGNED:  Kerney Elbe, DO [facility] 09/03/2017, 11:44 AM Pager [phone]  If 7PM-7AM, please contact night-coverage [email or web address] Password [email or web address]

## 2017-09-03 NOTE — Progress Notes (Signed)
PT Cancellation Note  Patient Details Name: Douglas Mann MRN: [id number] DOB: [birth year]   Cancelled Treatment:    Reason Eval/Treat Not Completed: Other (comment). Pt reports he is going home at noon (no dc order written at this time). Per notes family has arranged 24 hour care and declines home health.   Shary Decamp [place] 09/03/2017, 11:23 AM Suanne Marker PT [phone]

## 2017-09-06 ENCOUNTER — Telehealth: Payer: Self-pay | Admitting: *Deleted

## 2017-09-06 DIAGNOSIS — J449 Chronic obstructive pulmonary disease, unspecified: Secondary | ICD-10-CM

## 2017-09-06 DIAGNOSIS — I214 Non-ST elevation (NSTEMI) myocardial infarction: Secondary | ICD-10-CM

## 2017-09-06 DIAGNOSIS — I779 Disorder of arteries and arterioles, unspecified: Secondary | ICD-10-CM

## 2017-09-06 NOTE — Telephone Encounter (Signed)
Unable to reach patient at time of TCM Call.  Line busy, will attempt call back tomorrow.

## 2017-09-09 NOTE — Telephone Encounter (Signed)
Admit date: 08/29/2017 Discharge date: 09/03/2017  Admitted From: Home Disposition: Home as patient refused [place] and SNF  Recommendations for Outpatient Follow-up:  1. Follow up with PCP in 1-2 weeks 2. Follow up with Pulmonary within 1 week 3. Follow up with Cardiology within 1 week 4. Please obtain CMP/CBC, Mag, Phos in one week 5. Please follow up on the following pending results:  Home Health: No Patient refused  Equipment/Devices: None    Discharge Condition: Stable CODE STATUS: FULL CODE Diet recommendation: Dysphagia 3 Heart Healthy Diet   Spoke with pt and he states that he "is not doing well at all". He states that he "can't even get up and walk to the bathroom". He is on 4L O2 continuous and feels that this is working well. He states that he feels too weak to even come in to the office. Advised him that he had declined home health at d/c but that he may truly benefit from this service. He now agrees to home health if you will order. I again tried to encourage pt to come to office for follow-up but he insists that he "is too weak to go anywhere". He states that he does have his ex-wife, daughter and a friend all staying with him so that he is not alone at any time.   Dr. Elease Hashimoto - Please advise. Thanks!  Transition Care Management Follow-up Telephone Call   Date discharged? 09/03/17   How have you been since you were released from the hospital? Not doing well at all   Do you understand why you were in the hospital? yes   Do you understand the discharge instructions? yes   Where were you discharged to? home   Items Reviewed:  Medications reviewed: yes  Allergies reviewed: yes  Dietary changes reviewed: yes  Referrals reviewed: yes   Functional Questionnaire:   Activities of Daily Living (ADLs):   He states they are independent in the following: needs assistance will all ADL's States they require assistance with the following: ambulation,  bathing and hygiene, feeding, continence, grooming, toileting and dressing   Any transportation issues/concerns?: yes, he cannot drive   Any patient concerns? Yes, he feels very weak and fatigued   Confirmed importance and date/time of follow-up visits scheduled yes   Confirmed with patient if condition begins to worsen call PCP or go to the ER.  Patient was given the office number and encouraged to call back with question or concerns.  : yes

## 2017-09-09 NOTE — Telephone Encounter (Signed)
I really think that Palliative care- or possibly even Hospice evaluation may be appropriate- if he is willing.  Could start with Palliative care and then transition to Hospice if needed.

## 2017-09-09 NOTE — Telephone Encounter (Signed)
Palliative Care referral placed

## 2017-09-12 ENCOUNTER — Other Ambulatory Visit: Payer: Self-pay | Admitting: Family Medicine

## 2017-09-12 DIAGNOSIS — I5022 Chronic systolic (congestive) heart failure: Secondary | ICD-10-CM

## 2017-10-24 ENCOUNTER — Telehealth: Payer: Self-pay

## 2017-10-24 NOTE — Telephone Encounter (Signed)
Pt is calling to ask for refill on hydrocodone. Last refill was 04/20/17.

## 2017-10-24 NOTE — Telephone Encounter (Signed)
He has taken this only infrequently in past.  I recommend he try OTC Tylenol.  He is not on daily opioids and with new guidelines we would only be able to prescribe him a few days worth.

## 2017-10-25 NOTE — Telephone Encounter (Signed)
Patient is aware 

## 2017-10-28 ENCOUNTER — Telehealth: Payer: Self-pay | Admitting: Family Medicine

## 2017-10-28 NOTE — Telephone Encounter (Signed)
° ° ° °  Pt gets the below med free and they need a new RX   [employer]  Ref number [id number]   FAX NUMBER [phone]      Pt request refill of the following:  Fluticasone-Salmeterol (ADVAIR) 500-50 MCG/DOSE AEPB   Phamacy:

## 2017-11-01 MED ORDER — FLUTICASONE-SALMETEROL 500-50 MCG/DOSE IN AEPB: 1.0000 | INHALATION_SPRAY | Freq: Every day | RESPIRATORY_TRACT | 0 refills | Status: DC | PRN

## 2017-11-02 NOTE — Telephone Encounter (Signed)
Rx faxed and confirmed.

## 2017-11-19 ENCOUNTER — Other Ambulatory Visit: Payer: Self-pay | Admitting: Cardiology

## 2017-11-19 ENCOUNTER — Other Ambulatory Visit: Payer: Self-pay | Admitting: Family Medicine

## 2017-11-19 DIAGNOSIS — I5022 Chronic systolic (congestive) heart failure: Secondary | ICD-10-CM

## 2017-11-22 ENCOUNTER — Telehealth (HOSPITAL_COMMUNITY): Payer: Self-pay | Admitting: *Deleted

## 2017-11-22 NOTE — Telephone Encounter (Signed)
Pt called stating he needed a refill of his Amiodarone and his pcp would not renew it for him.  I advised pt he has not been seen in our clinic since 2016 and that he needed an appt.  Advised I could go ahead and refill his med and make him an appt.  He refused and stated he did not need an appt he was feeling fine and he would just stop the med.  I advised pt that the medication is import and is keeping his heart in normal rhythm, that is why he feels good and if he stops the medication his heart may go into an irregular rhythm and that would cause him to feel bad.  Advised pt we have to do some monitoring while he is on this medication and we can not continue to prescribe it if he is not going to be seen here.  He again stated that he did not need an appointment and he wasn't going to schedule one.  I advised him to call his pcp back to see if they would prescribe it as they have been checking labs, pt stated ok and hung up.  Will send to Dr Haroldine Laws.

## 2017-11-25 ENCOUNTER — Other Ambulatory Visit: Payer: Self-pay | Admitting: *Deleted

## 2017-11-25 DIAGNOSIS — I5022 Chronic systolic (congestive) heart failure: Secondary | ICD-10-CM

## 2017-11-25 MED ORDER — AMIODARONE HCL 200 MG PO TABS: ORAL_TABLET | ORAL | 1 refills | Status: DC

## 2017-12-21 ENCOUNTER — Telehealth: Payer: Self-pay | Admitting: Family Medicine

## 2017-12-21 NOTE — Telephone Encounter (Signed)
Copied from [place] [id number]. Topic: Quick Communication - See Telephone Encounter >> Dec 21, 2017 12:21 PM Hewitt Shorts wrote: CRM for notification. See Telephone encounter for:  [place] patient Assistant program is calling to confirm the directions of the patients advair reference number is [id number]  Best number [id number]  12/21/17.

## 2017-12-22 NOTE — Telephone Encounter (Signed)
Spoke with pharmacist and Rx done

## 2017-12-27 ENCOUNTER — Other Ambulatory Visit: Payer: Self-pay | Admitting: *Deleted

## 2017-12-27 MED ORDER — RANOLAZINE ER 500 MG PO TB12: 500.0000 mg | ORAL_TABLET | Freq: Two times a day (BID) | ORAL | 2 refills | Status: DC

## 2018-01-25 ENCOUNTER — Other Ambulatory Visit: Payer: Self-pay | Admitting: Family Medicine

## 2018-01-25 DIAGNOSIS — I5022 Chronic systolic (congestive) heart failure: Secondary | ICD-10-CM

## 2018-04-10 ENCOUNTER — Other Ambulatory Visit: Payer: Self-pay | Admitting: Family Medicine

## 2018-04-15 ENCOUNTER — Other Ambulatory Visit: Payer: Self-pay | Admitting: Family Medicine

## 2018-04-15 DIAGNOSIS — I5022 Chronic systolic (congestive) heart failure: Secondary | ICD-10-CM

## 2018-06-19 ENCOUNTER — Other Ambulatory Visit: Payer: Self-pay | Admitting: Family Medicine

## 2018-06-19 DIAGNOSIS — I5022 Chronic systolic (congestive) heart failure: Secondary | ICD-10-CM

## 2018-06-19 NOTE — Telephone Encounter (Signed)
See phone not from cardiology 11/22/17. Ok to fill meds requested?

## 2018-06-19 NOTE — Telephone Encounter (Signed)
Refill for one month.  Does need office follow up.

## 2018-06-23 ENCOUNTER — Telehealth: Payer: Self-pay

## 2018-06-23 NOTE — Telephone Encounter (Signed)
Copied from [place] [id number]. Topic: Inquiry >> Jun 23, 2018 10:59 AM Pricilla Handler wrote: Reason for CRM: Patient called requesting a refill on ALL of his medications. Patient would like a call back. Several medications listed. Please call patient.       Pablo Ledger

## 2018-06-26 NOTE — Telephone Encounter (Signed)
Spoke with patient and an appointment made.  Refills will be done at the appointment.

## 2018-07-18 NOTE — Progress Notes (Addendum)
Subjective:   Douglas Mann is a 72 y.o. male who presents for Medicare Annual/Subsequent preventive examination.  Reports health as good AWV Exam questionnaire scanned to file  Has assistant with him. Has home care "almost all the time" per his report  Lives in apt.  Needs help with ADLs and has help  Just got a lift chair States he does PT exercises at home  PTCA 07/2014   Diet Chol/hdl 3 A1c 5.8  Has meal so wheels; eats out most of the time unless he fixes a sandwich.   Exercise PT exercises with the assistance of an aid   Former smoker; 75 pack years; quit x 72 yo   Health Maintenance Due  Topic Date Due  . 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 Would not be a candidate for surgery  Hx of COPD and followed with Chest xrays often Not a candidate for LDCT program and declines any further test  Educated regarding the shingrix and he declines this as well   Cardiac Risk Factors include: advanced age (>58men, >98 women);dyslipidemia;family history of premature cardiovascular disease;hypertension;male gender;sedentary lifestyle;smoking/ tobacco exposure     Objective:    Vitals: BP 130/90   Ht 5\' 7"  (1.702 m)   Wt 157 lb (71.2 kg)   BMI 24.59 kg/m   Body mass index is 24.59 kg/m.  Advanced Directives 07/19/2018 08/29/2017 08/29/2017 09/12/2014 09/10/2014 08/13/2014  Does Patient Have a Medical Advance Directive? Yes Yes Yes No Yes Yes  Type of Advance Directive - [place];Living will [place];Living will - [place];Living will [place]  Does patient want to make changes to medical advance directive? - No - Patient declined - - - No - Patient declined  Copy of [place] in Chart? - Yes No - copy requested - No -  copy requested Yes  Would patient like information on creating a medical advance directive? - - - No - patient declined information - -    Tobacco Social History   Tobacco Use  Smoking Status Former Smoker  . Packs/day: 1.50  . Years: 50.00  . Pack years: 75.00  . Types: Cigarettes  Smokeless Tobacco Never Used  Tobacco Comment   quit  about 2 years ago     Counseling given: Yes Comment: quit  about 2 years ago   Clinical Intake:     Past Medical History:  Diagnosis Date  . ALLERGIC RHINITIS 12/24/2009  . CAD 10/01/2009   Stent at [place] greater than 10 years ago  . COPD 10/01/2009  . HYPERLIPIDEMIA 10/01/2009  . HYPERTENSION 10/01/2009  . On home oxygen therapy    "3.5 - 4L; 24/7" (08/29/2017)  . 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 PVD 10/01/2009   Past Surgical History:  Procedure Laterality Date  . CARDIAC CATHETERIZATION  08/16/2014   Procedure: IABP INSERTION;  Surgeon: Leonie Man, MD;  Location: [facility] CATH LAB;  Service: Cardiovascular;;  . FRACTURE SURGERY  2012   ORIF r tibia fracture  . LEFT HEART CATH Bilateral 08/15/2014   Procedure: LEFT HEART CATH;  Surgeon: Leonie Man, MD;  Location: [facility] CATH LAB;  Service: Cardiovascular;  Laterality: Bilateral;  . PERCUTANEOUS CORONARY STENT INTERVENTION (PCI-S) N/A 08/16/2014   Procedure: PERCUTANEOUS CORONARY STENT INTERVENTION (PCI-S);  Surgeon: Leonie Man, MD;  Location: [facility] CATH LAB;  Service: Cardiovascular;  Laterality: N/A;  . TONSILLECTOMY AND ADENOIDECTOMY     Family History  Problem Relation Age of Onset  . CAD Father        Died age 72 MI   Social History   Socioeconomic History  . Marital status: Divorced    Spouse name: Not on file  . Number of children: 2  . Years of education: Not on file  . Highest education level: Not on file  Occupational History  . Occupation: Retired    Fish farm manager: DISABLED  Social Needs  . Financial resource strain: Not on file  . Food  insecurity:    Worry: Not on file    Inability: Not on file  . Transportation needs:    Medical: Not on file    Non-medical: Not on file  Tobacco Use  . Smoking status: Former Smoker    Packs/day: 1.50    Years: 50.00    Pack years: 75.00    Types: Cigarettes  . Smokeless tobacco: Never Used  . Tobacco comment: quit  about 2 years ago  Substance and Sexual Activity  . Alcohol use: No    Alcohol/week: 0.0 oz  . Drug use: No  . Sexual activity: Not on file  Lifestyle  . Physical activity:    Days per week: Not on file    Minutes per session: Not on file  . Stress: Not on file  Relationships  . Social connections:    Talks on phone: Not on file    Gets together: Not on file    Attends religious service: Not on file    Active member of club or organization: Not on file    Attends meetings of clubs or organizations: Not on file    Relationship status: Not on file  Other Topics Concern  . Not on file  Social History Narrative   Lives alone.      Outpatient Encounter Medications as of 07/19/2018  Medication Sig  . albuterol (VENTOLIN HFA) 108 (90 Base) MCG/ACT inhaler Inhale 2 puffs into the lungs every 6 (six) hours as needed. (Patient taking differently: Inhale 2 puffs into the lungs every 6 (six) hours as needed for wheezing or shortness of breath. )  . amiodarone (PACERONE) 200 MG tablet Take 100 mg by mouth once a day  . aspirin 81 MG tablet Take 81 mg by mouth daily as needed (for chest pain).   Marland Kitchen atorvastatin (LIPITOR) 40 MG tablet TAKE 1 TABLET EVERY DAY  . carvedilol (COREG) 3.125 MG tablet TAKE 1 TABLET TWICE DAILY  . clopidogrel (PLAVIX) 75 MG tablet TAKE 1 TABLET EVERY DAY  . feeding supplement, ENSURE ENLIVE, (ENSURE ENLIVE) LIQD Take 237 mLs by mouth 2 (two) times daily between meals.  . fish oil-omega-3 fatty acids 1000 MG capsule Take 1 g by mouth daily.   . Fluticasone-Salmeterol (ADVAIR DISKUS) 500-50 MCG/DOSE AEPB Inhale 1 puff daily as needed into the  lungs. (Patient taking differently: Inhale 1 puff into the lungs 2 (two) times daily. )  . furosemide (LASIX) 40 MG tablet Take 1 tablet (40 mg total) by mouth as needed.  Marland Kitchen guaiFENesin (MUCINEX) 600 MG 12 hr tablet Take 2 tablets (1,200 mg total) by mouth 2 (two) times daily.  . hydrocortisone (ANUSOL-HC) 2.5 % rectal cream apply rectally twice a day if needed for HEMORRHOIDS or itching  . levothyroxine (SYNTHROID, LEVOTHROID) 25 MCG tablet TAKE 1 TABLET ONE  TIME DAILY (NEED MD APPOINTMENT)  . losartan (COZAAR) 50 MG tablet TAKE 1 TABLET (50 MG TOTAL) BY MOUTH DAILY.  . ranolazine (RANEXA) 500 MG 12 hr tablet Take 1 tablet (500 mg total) by mouth 2 (two) times daily.  . tamsulosin (FLOMAX) 0.4 MG CAPS capsule TAKE 1 CAPSULE EVERY DAY  . [DISCONTINUED] lisinopril (PRINIVIL,ZESTRIL) 5 MG tablet Take 1 tablet (5 mg total) by mouth daily.  . [DISCONTINUED] predniSONE (STERAPRED UNI-PAK 21 TAB) 10 MG (21) TBPK tablet Take 6 pills Day 1, 5 Pills Day 2, 4 Pills Day 3, 3 Pills Day 4, 2 Pills Day 5, 1 Pill Day 6 and stop  . [DISCONTINUED] triamcinolone cream (KENALOG) 0.1 % Apply 1 application topically 2 (two) times daily as needed. Compound 1:1 with Eucerin and apply to affected skin bid prn (Patient taking differently: Apply 1 application topically See admin instructions. compounded 1:1 with Eucerin and apply to affected skin two times a day as needed for dryness)   No facility-administered encounter medications on file as of 07/19/2018.     Activities of Daily Living In your present state of health, do you have any difficulty performing the following activities: 07/19/2018 08/29/2017  Hearing? N N  Vision? N N  Difficulty concentrating or making decisions? N Y  Comment - "some"  Walking or climbing stairs? Y Y  Dressing or bathing? Y N  Doing errands, shopping? N Y  Comment - "I don't drive"  Preparing Food and eating ? Y -  Using the Toilet? Y -  In the past six months, have you accidently  leaked urine? Y -  Comment has urgency -  Do you have problems with loss of bowel control? N -  Managing your Medications? N -  Managing your Finances? N -  Housekeeping or managing your Housekeeping? N -  Some recent data might be hidden    Patient Care Team: [name], MD as PCP - General   Assessment:   This is a routine wellness examination for Douglas Mann.  Exercise Activities and Dietary recommendations Current Exercise Habits: Home exercise routine, Intensity: Mild  Goals    . Patient Stated     Maintain his health !        Fall Risk Fall Risk  07/19/2018 07/19/2018 04/20/2017 04/20/2017 03/02/2016  Falls in the past year? No No No No No     Depression Screen PHQ 2/9 Scores 07/19/2018 07/19/2018 04/20/2017 04/20/2017  PHQ - 2 Score 3 0 0 0  PHQ- 9 Score 8 - - -    Cognitive Function MMSE - Mini Mental State Exam 07/19/2018  Not completed: (No Data)   Ad8 score reviewed for issues:  Issues making decisions:  Less interest in hobbies / activities:  Repeats questions, stories (family complaining):  Trouble using ordinary gadgets (microwave, computer, phone):  Forgets the month or year:   Mismanaging finances:   Remembering appts:  Daily problems with thinking and/or memory: Ad8 score is=0          Immunization History  Administered Date(s) Administered  . Influenza Split 10/07/2012  . Influenza Whole 08/28/2009, 08/19/2010, 09/04/2011  . Influenza, High Dose Seasonal PF 08/29/2015, 09/03/2017  . Influenza,inj,Quad PF,6+ Mos 09/14/2013, 08/07/2014  . PPD Test 08/29/2015  . Pneumococcal Conjugate-13 04/20/2017  . Pneumococcal Polysaccharide-23 10/01/2009      Screening Tests Health Maintenance  Topic Date Due  . TETANUS/TDAP  02/18/1965  . INFLUENZA VACCINE  07/20/2018  . 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 Would not be a candidate for surgery  Hx of COPD and followed with Chest xrays often Not a candidate for LDCT program and declines any further test  Educated regarding the shingrix and he declines this as well    Abnormal Screens  none  Referrals  none  Patient concerns; Seems to be in lighthearted mood today. Appears content with his current  Lifestyle and plan with full time home care. Memory intact; He has placed a BSC beside his bed as he is afraid of falling at hs but this is the only functional change in the last few months.  Aide with him today   Nurse Concerns; Seems to be thriving him home with assistance  Next PCP apt Was seen today       I have personally reviewed and noted the following in the patient's chart:   . Medical and social history . Use of alcohol, tobacco or illicit drugs  . Current medications and supplements . Functional ability and status . Nutritional status . Physical activity . Advanced directives . List of other physicians . Hospitalizations, surgeries, and ER visits in previous 12 months . Vitals . Screenings to include cognitive, depression, and falls . Referrals and appointments  In addition, I have reviewed and discussed with patient certain preventive protocols, quality metrics, and best practice recommendations. A written personalized care plan for preventive services as well as general preventive health recommendations were provided to patient.     BRAXE,NMMHW, RN  07/19/2018  I have reviewed the documentation for the AWV and [place] provided by the health coach and agree with their documentation. I was immediately available for any questions.  Agree he is not a good candidate for further AAA screening given his severe lung disease and multiple co-morbidities.  [name] MD [facility] at  [facility]

## 2018-07-19 ENCOUNTER — Encounter: Payer: Self-pay | Admitting: Family Medicine

## 2018-07-19 ENCOUNTER — Ambulatory Visit (INDEPENDENT_AMBULATORY_CARE_PROVIDER_SITE_OTHER): Payer: Medicare HMO | Admitting: Family Medicine

## 2018-07-19 ENCOUNTER — Ambulatory Visit (INDEPENDENT_AMBULATORY_CARE_PROVIDER_SITE_OTHER): Payer: Medicare HMO

## 2018-07-19 VITALS — BP 130/90 | HR 97 | Temp 97.7°F | Ht 67.0 in | Wt 157.0 lb

## 2018-07-19 VITALS — BP 130/90 | Ht 67.0 in | Wt 157.0 lb

## 2018-07-19 DIAGNOSIS — I5022 Chronic systolic (congestive) heart failure: Secondary | ICD-10-CM | POA: Diagnosis not present

## 2018-07-19 DIAGNOSIS — E785 Hyperlipidemia, unspecified: Secondary | ICD-10-CM | POA: Diagnosis not present

## 2018-07-19 DIAGNOSIS — Z0000 Encounter for general adult medical examination without abnormal findings: Secondary | ICD-10-CM | POA: Diagnosis not present

## 2018-07-19 DIAGNOSIS — I779 Disorder of arteries and arterioles, unspecified: Secondary | ICD-10-CM | POA: Diagnosis not present

## 2018-07-19 DIAGNOSIS — J449 Chronic obstructive pulmonary disease, unspecified: Secondary | ICD-10-CM | POA: Diagnosis not present

## 2018-07-19 DIAGNOSIS — I10 Essential (primary) hypertension: Secondary | ICD-10-CM

## 2018-07-19 DIAGNOSIS — I2510 Atherosclerotic heart disease of native coronary artery without angina pectoris: Secondary | ICD-10-CM

## 2018-07-19 DIAGNOSIS — E039 Hypothyroidism, unspecified: Secondary | ICD-10-CM | POA: Diagnosis not present

## 2018-07-19 DIAGNOSIS — R5383 Other fatigue: Secondary | ICD-10-CM

## 2018-07-19 LAB — CBC WITH DIFFERENTIAL/PLATELET
BASOS ABS: 0 10*3/uL (ref 0.0–0.1)
Basophils Relative: 0.7 % (ref 0.0–3.0)
EOS ABS: 0.1 10*3/uL (ref 0.0–0.7)
Eosinophils Relative: 2 % (ref 0.0–5.0)
HCT: 38.1 % — ABNORMAL LOW (ref 39.0–52.0)
Hemoglobin: 12.7 g/dL — ABNORMAL LOW (ref 13.0–17.0)
LYMPHS ABS: 2 10*3/uL (ref 0.7–4.0)
LYMPHS PCT: 28.5 % (ref 12.0–46.0)
MCHC: 33.2 g/dL (ref 30.0–36.0)
MCV: 87.7 fl (ref 78.0–100.0)
Monocytes Absolute: 0.8 10*3/uL (ref 0.1–1.0)
Monocytes Relative: 11.8 % (ref 3.0–12.0)
NEUTROS ABS: 4 10*3/uL (ref 1.4–7.7)
NEUTROS PCT: 57 % (ref 43.0–77.0)
PLATELETS: 247 10*3/uL (ref 150.0–400.0)
RBC: 4.35 Mil/uL (ref 4.22–5.81)
RDW: 15.7 % — ABNORMAL HIGH (ref 11.5–15.5)
WBC: 7.1 10*3/uL (ref 4.0–10.5)

## 2018-07-19 LAB — BASIC METABOLIC PANEL
BUN: 9 mg/dL (ref 6–23)
CALCIUM: 9.4 mg/dL (ref 8.4–10.5)
CO2: 31 meq/L (ref 19–32)
Chloride: 93 mEq/L — ABNORMAL LOW (ref 96–112)
Creatinine, Ser: 1.07 mg/dL (ref 0.40–1.50)
GFR: 71.72 mL/min (ref >60.00)
GLUCOSE: 104 mg/dL — AB (ref 70–99)
Potassium: 4.8 mEq/L (ref 3.5–5.1)
SODIUM: 129 meq/L — AB (ref 135–145)

## 2018-07-19 LAB — LIPID PANEL
Cholesterol: 172 mg/dL (ref 0–200)
HDL: 53.2 mg/dL (ref >39.00)
LDL Cholesterol: 98 mg/dL (ref 0–99)
NonHDL: 119.28
TRIGLYCERIDES: 108 mg/dL (ref 0.0–149.0)
Total CHOL/HDL Ratio: 3
VLDL: 21.6 mg/dL (ref 0.0–40.0)

## 2018-07-19 LAB — HEPATIC FUNCTION PANEL
ALK PHOS: 88 U/L (ref 39–117)
ALT: 9 U/L (ref 0–53)
AST: 11 U/L (ref 0–37)
Albumin: 4.2 g/dL (ref 3.5–5.2)
BILIRUBIN DIRECT: 0.2 mg/dL (ref 0.0–0.3)
TOTAL PROTEIN: 7 g/dL (ref 6.0–8.3)
Total Bilirubin: 0.7 mg/dL (ref 0.2–1.2)

## 2018-07-19 LAB — TSH: TSH: 2.69 u[IU]/mL (ref 0.35–4.50)

## 2018-07-19 LAB — MAGNESIUM: Magnesium: 2 mg/dL (ref 1.5–2.5)

## 2018-07-19 MED ORDER — MICONAZOLE NITRATE 2 % EX AERP: 1.0000 "application " | INHALATION_SPRAY | Freq: Every day | CUTANEOUS | 0 refills | Status: DC | PRN

## 2018-07-19 NOTE — Patient Instructions (Addendum)
  Mr. Bugarin , Thank you for taking time to come for your Medicare Wellness Visit. I appreciate your ongoing commitment to your health goals. Please review the following plan we discussed and let me know if I can assist you in the future.   A Tetanus is recommended every 10 years. Medicare covers a tetanus if you have a cut or wound; otherwise, there may be a charge. If you had not had a tetanus with pertusses, known as the Tdap, you can take this anytime.   Shingrix is a vaccine for the prevention of Shingles in Adults 50 and older.  If you are on Medicare, the shingrix is covered under your Part D plan, so you will take both of the vaccines in the series at your pharmacy. Please check with your benefits regarding applicable copays or out of pocket expenses.  The Shingrix is given in 2 vaccines approx 8 weeks apart. You must receive the 2nd dose prior to 6 months from receipt of the first. Please have the pharmacist print out you Immunization  dates for our office records    These are the goals we discussed: Goals    None      This is a list of the screening recommended for you and due dates:  Health Maintenance  Topic Date Due  . Tetanus Vaccine  02/18/1965  . Flu Shot  07/20/2018  . Pneumonia vaccines  Completed

## 2018-07-19 NOTE — Progress Notes (Signed)
Subjective:     Patient ID: Douglas Mann, male   DOB: [birth year], 72 y.o.   MRN: [id number]  HPI Patient seen for medical follow-up. He has multiple medical problems including history of hyperlipidemia, COPD, hypothyroidism, hypertension, CAD, peripheral vascular disease, chronic kidney disease, chronic systolic heart failure Is on chronic oxygen. He has done amazingly well over the past few years considering his multiple comorbidities. He states he feels well at this time. No recent chest pains. He has some chronic dyspnea at baseline but unchanged.  Hyperlipidemia treated with atorvastatin. He remains on Plavix. He is on several blood pressure medications. He takes levothyroxin for hypothyroidism. He is due for follow-up labs. He is on Advair and as needed albuterol for his COPD. He is requesting miconazole powder which is used for some rash and irritation groin region.  Past Medical History:  Diagnosis Date  . ALLERGIC RHINITIS 12/24/2009  . CAD 10/01/2009   Stent at [place] greater than 10 years ago  . COPD 10/01/2009  . HYPERLIPIDEMIA 10/01/2009  . HYPERTENSION 10/01/2009  . On home oxygen therapy    "3.5 - 4L; 24/7" (08/29/2017)  . 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 PVD 10/01/2009   Past Surgical History:  Procedure Laterality Date  . CARDIAC CATHETERIZATION  08/16/2014   Procedure: IABP INSERTION;  Surgeon: Leonie Man, MD;  Location: [facility] CATH LAB;  Service: Cardiovascular;;  . FRACTURE SURGERY  2012   ORIF r tibia fracture  . LEFT HEART CATH Bilateral 08/15/2014   Procedure: LEFT HEART CATH;  Surgeon: Leonie Man, MD;  [facility] C[facility] LAB;  Service: Cardiovascular;  Laterality: Bilateral;  . PERCUTANEOUS CORONARY STENT INTERVENTION (PCI-S) N/A 08/16/2014   Procedure: PERCUTANEOUS CORONARY STENT INTERVENTION (PCI-S);  Surgeon: Leonie Man, MD;  Location[facility] CATH LAB;  Service: Cardiovascular;  Laterality: N/A;  . TONSILLECTOMY AND ADENOIDECTOMY      reports that he has quit smoking. His smoking use included cigarettes. He has a 75.00 pack-year smoking history. He has never used smokeless tobacco. He reports that he does not drink alcohol or use drugs. family history includes CAD in his father. No Known Allergies   Review of Systems  Constitutional: Negative for chills, fatigue and fever.  Eyes: Negative for visual disturbance.  Respiratory: Positive for shortness of breath. Negative for cough, chest tightness and wheezing.   Cardiovascular: Negative for chest pain, palpitations and leg swelling.  Gastrointestinal: Negative for abdominal pain.  Endocrine: Negative for polydipsia and polyuria.  Genitourinary: Negative for difficulty urinating and dysuria.  Neurological: Negative for dizziness, syncope, weakness, light-headedness and headaches.       Objective:   Physical Exam  Constitutional: He is oriented to person, place, and time. He appears well-developed and well-nourished.  Cardiovascular: Normal rate and regular rhythm.  Pulmonary/Chest:  Diminished breath sounds throughout. No respiratory distress. Pulse oximetry 96% with oxygen on  Musculoskeletal: He exhibits no edema.  Neurological: He is alert and oriented to person, place, and time.       Assessment:     #1 history of CAD. He also has history of chronic systolic heart failure symptomatically stable  #2 hypertension stable  #3 COPD on chronic oxygen  #4 hyperlipidemia  #5 hypothyroidism    Plan:     -Recheck labs with lipid panel, hepatic panel, basic metabolic panel, CBC, TSH, magnesium level -Refill miconazole powder for as needed use -Recommend yearly flu vaccine  [name] MD [facility] at [facility]

## 2018-07-26 ENCOUNTER — Telehealth: Payer: Self-pay | Admitting: Family Medicine

## 2018-07-26 ENCOUNTER — Other Ambulatory Visit: Payer: Self-pay | Admitting: *Deleted

## 2018-07-26 MED ORDER — FLUTICASONE-SALMETEROL 500-50 MCG/DOSE IN AEPB: 1.0000 | INHALATION_SPRAY | Freq: Every day | RESPIRATORY_TRACT | 2 refills | Status: DC | PRN

## 2018-07-26 NOTE — Telephone Encounter (Signed)
Copied from [place] [id number]. Topic: Quick Communication - Rx Refill/Question >> Jul 26, 2018 11:26 AM Gardiner Ramus wrote: Medication:Fluticasone-Salmeterol (ADVAIR DISKUS) 500-50 MCG/DOSE AEPB [[id number]] 3 month supply  Has the patient contacted their pharmacy? Yes Preferred Pharmacy (with phone number or street name):[facility] - [place], [place] - [address] [phone] (Phone) [phone] (Fax)   Agent: Please be advised that RX refills may take up to 3 business days. We ask that you follow-up with your pharmacy.

## 2018-08-01 NOTE — Telephone Encounter (Signed)
Spoke with pharmacist at [facility] and thye received the refill on 07/26/18 and it was shipped 07/28/18.  Patient is aware.

## 2018-08-01 NOTE — Telephone Encounter (Signed)
Pt states pharmacy says they still havent received order.

## 2018-08-16 ENCOUNTER — Other Ambulatory Visit: Payer: Self-pay | Admitting: *Deleted

## 2018-08-16 MED ORDER — LOSARTAN POTASSIUM 50 MG PO TABS: ORAL_TABLET | ORAL | 3 refills | Status: DC

## 2018-08-16 MED ORDER — FLUTICASONE-SALMETEROL 500-50 MCG/DOSE IN AEPB: 1.0000 | INHALATION_SPRAY | Freq: Two times a day (BID) | RESPIRATORY_TRACT | 3 refills | Status: DC

## 2018-09-12 ENCOUNTER — Other Ambulatory Visit: Payer: Self-pay | Admitting: Family Medicine

## 2018-09-12 DIAGNOSIS — I5022 Chronic systolic (congestive) heart failure: Secondary | ICD-10-CM

## 2018-09-20 ENCOUNTER — Other Ambulatory Visit: Payer: Self-pay | Admitting: Family Medicine

## 2018-09-23 ENCOUNTER — Other Ambulatory Visit: Payer: Self-pay | Admitting: Family Medicine

## 2018-09-23 DIAGNOSIS — I5022 Chronic systolic (congestive) heart failure: Secondary | ICD-10-CM

## 2018-09-25 NOTE — Telephone Encounter (Signed)
Per [employer]: Caller states he is trying to get all of his scripts, 3 months at a time. Pt wants to know if that is even possible for him to do.

## 2018-10-23 ENCOUNTER — Other Ambulatory Visit: Payer: Self-pay | Admitting: Family Medicine

## 2018-12-27 ENCOUNTER — Other Ambulatory Visit: Payer: Self-pay | Admitting: Family Medicine

## 2018-12-27 DIAGNOSIS — I5022 Chronic systolic (congestive) heart failure: Secondary | ICD-10-CM

## 2019-01-31 ENCOUNTER — Other Ambulatory Visit: Payer: Self-pay | Admitting: Family Medicine

## 2019-01-31 DIAGNOSIS — I5022 Chronic systolic (congestive) heart failure: Secondary | ICD-10-CM

## 2019-05-14 ENCOUNTER — Other Ambulatory Visit: Payer: Self-pay | Admitting: Family Medicine

## 2019-05-14 DIAGNOSIS — I5022 Chronic systolic (congestive) heart failure: Secondary | ICD-10-CM

## 2019-05-22 ENCOUNTER — Other Ambulatory Visit: Payer: Self-pay | Admitting: Family Medicine

## 2019-06-24 ENCOUNTER — Other Ambulatory Visit: Payer: Self-pay | Admitting: Family Medicine

## 2019-06-24 DIAGNOSIS — I5022 Chronic systolic (congestive) heart failure: Secondary | ICD-10-CM

## 2019-06-26 NOTE — Telephone Encounter (Signed)
Refill once and offer Doxy.

## 2019-06-26 NOTE — Telephone Encounter (Signed)
Lasix was last filled by Dr. Glori Bickers in 2017  OK to fill or set up Doxy?

## 2019-07-13 ENCOUNTER — Telehealth: Payer: Self-pay

## 2019-07-13 NOTE — Telephone Encounter (Signed)
Patient has a telephone appointment at 8:30am on Monday morning to discuss

## 2019-07-13 NOTE — Telephone Encounter (Signed)
Copied from [place] [id number]. Topic: General - Call Back - No Documentation >> Jul 13, 2019 10:43 AM Erick Blinks wrote: Reason for CRM: Pt called because he had malfunction with his oxygen tank. He wants to discuss other options for his meter or possibly a bigger machine to supply more oxygen.  Best contact: [phone]

## 2019-07-16 ENCOUNTER — Encounter: Payer: Self-pay | Admitting: Family Medicine

## 2019-07-16 ENCOUNTER — Other Ambulatory Visit: Payer: Self-pay | Admitting: Family Medicine

## 2019-07-16 ENCOUNTER — Ambulatory Visit (INDEPENDENT_AMBULATORY_CARE_PROVIDER_SITE_OTHER): Payer: Medicare HMO | Admitting: Family Medicine

## 2019-07-16 ENCOUNTER — Other Ambulatory Visit: Payer: Self-pay

## 2019-07-16 DIAGNOSIS — N183 Chronic kidney disease, stage 3 unspecified: Secondary | ICD-10-CM

## 2019-07-16 DIAGNOSIS — J449 Chronic obstructive pulmonary disease, unspecified: Secondary | ICD-10-CM

## 2019-07-16 DIAGNOSIS — E039 Hypothyroidism, unspecified: Secondary | ICD-10-CM

## 2019-07-16 DIAGNOSIS — I5022 Chronic systolic (congestive) heart failure: Secondary | ICD-10-CM

## 2019-07-16 DIAGNOSIS — I10 Essential (primary) hypertension: Secondary | ICD-10-CM

## 2019-07-16 DIAGNOSIS — E785 Hyperlipidemia, unspecified: Secondary | ICD-10-CM | POA: Diagnosis not present

## 2019-07-16 NOTE — Progress Notes (Signed)
Patient ID: Douglas Mann, male   DOB: [birth year], 73 y.o.   MRN: [id number]  This visit type was conducted due to national recommendations for restrictions regarding the COVID-19 pandemic in an effort to limit this patient's exposure and mitigate transmission in our community.   Virtual Visit via Telephone Note  I connected with Douglas Mann on 07/16/19 at  8:30 AM EDT by telephone and verified that I am speaking with the correct person using two identifiers.   I discussed the limitations, risks, security and privacy concerns of performing an evaluation and management service by telephone and the availability of in person appointments. I also discussed with the patient that there may be a patient responsible charge related to this service. The patient expressed understanding and agreed to proceed.  Location patient: home Location provider: work or home office Participants present for the call: patient, provider Patient did not have a visit in the prior 7 days to address this/these issue(s).   History of Present Illness: Patient has multiple chronic problems and is engaged for phone follow-up.  He is very high risk for COVID-19 infection and stays very isolated.  He has a friend who helps acquire things like groceries.  He has severe COPD and is on oxygen 24/7.  He feels his breathing is relatively stable.  Other chronic problems include hypertension, chronic kidney disease, history of CAD, systolic heart failure, hypothyroidism, BPH, hyperlipidemia.  Medications reviewed.  Compliant with all.  He is overdue for labs but very reluctant to come in at this time.  He has had some recent mild BPH symptoms.  He takes Flomax regularly.  No burning with urination.  No fevers or chills.  Denies any recent chest pains   Observations/Objective: Patient sounds cheerful and well on the phone. I do not appreciate any SOB. Speech and thought processing are grossly intact. Patient reported  vitals:  Assessment and Plan:  #1 severe oxygen dependent COPD-symptomatically stable  #2 hypertension.  Currently not being monitored  #3 hypothyroidism-overdue for labs  #4 history of chronic kidney disease  #5 hypothyroidism in a patient on amiodarone -Needs follow-up labs including TSH.  Patient is refusing to come in at this time  #6 history of CAD  #7 hyperlipidemia-treated with Lipitor  #8 chronic systolic heart failure.  He has some mild edema which is controlled with Lasix.  No significant dyspnea at rest.  Not weighing himself daily   Follow Up Instructions:  -He really needs follow-up labs including lipids, hepatic, chemistries, magnesium, TSH.  He is refusing to come in at this time.  We did discuss potential option of home health referral and he will think this over.  If he decides against home health referral he will certainly need to come in next 3 to 4 months for lab work   [id number] 5-10 [id number] 11-20 [id number] 21-30 I did not refer this patient for an OV in the next 24 hours for this/these issue(s).  I discussed the assessment and treatment plan with the patient. The patient was provided an opportunity to ask questions and all were answered. The patient agreed with the plan and demonstrated an understanding of the instructions.   The patient was advised to call back or seek an in-person evaluation if the symptoms worsen or if the condition fails to improve as anticipated.  I provided 25 minutes of non-face-to-face time during this encounter.   Carolann Littler, MD

## 2019-07-24 ENCOUNTER — Telehealth: Payer: Self-pay

## 2019-07-24 ENCOUNTER — Ambulatory Visit: Payer: Medicare HMO

## 2019-07-24 DIAGNOSIS — I10 Essential (primary) hypertension: Secondary | ICD-10-CM

## 2019-07-24 DIAGNOSIS — E785 Hyperlipidemia, unspecified: Secondary | ICD-10-CM

## 2019-07-24 DIAGNOSIS — E039 Hypothyroidism, unspecified: Secondary | ICD-10-CM

## 2019-07-24 DIAGNOSIS — N183 Chronic kidney disease, stage 3 unspecified: Secondary | ICD-10-CM

## 2019-07-24 NOTE — Telephone Encounter (Signed)
Copied from [place] [id number]. Topic: General - Inquiry >> Jul 24, 2019  9:42 AM Virl Axe D wrote: Reason for CRM: Pt stated that at this recent appt with Dr. Elease Hashimoto, he was told to go to the lab. No orders have been placed. Please advise and contact pt to schedule.

## 2019-07-24 NOTE — Telephone Encounter (Signed)
Called patient and he has a lab appointment at [facility] this Friday and is aware to call the number and wear his mask. Patient verbalized an understanding.

## 2019-07-24 NOTE — Telephone Encounter (Signed)
As per my note, he was refusing to come in for labs- that is why we did not order.  Did he forget this??  I have placed orders.

## 2019-07-24 NOTE — Telephone Encounter (Signed)
Can you order all of the labs he needs then I will schedule him?

## 2019-07-27 ENCOUNTER — Other Ambulatory Visit (INDEPENDENT_AMBULATORY_CARE_PROVIDER_SITE_OTHER): Payer: Medicare HMO

## 2019-07-27 ENCOUNTER — Other Ambulatory Visit: Payer: Self-pay

## 2019-07-27 DIAGNOSIS — E785 Hyperlipidemia, unspecified: Secondary | ICD-10-CM

## 2019-07-27 DIAGNOSIS — E039 Hypothyroidism, unspecified: Secondary | ICD-10-CM

## 2019-07-27 DIAGNOSIS — N183 Chronic kidney disease, stage 3 unspecified: Secondary | ICD-10-CM

## 2019-07-27 LAB — LIPID PANEL
Cholesterol: 174 mg/dL (ref 0–200)
HDL: 41.4 mg/dL (ref >39.00)
NonHDL: 132.89
Total CHOL/HDL Ratio: 4
Triglycerides: 211 mg/dL — ABNORMAL HIGH (ref 0.0–149.0)
VLDL: 42.2 mg/dL — ABNORMAL HIGH (ref 0.0–40.0)

## 2019-07-27 LAB — BASIC METABOLIC PANEL
BUN: 18 mg/dL (ref 6–23)
CO2: 37 mEq/L — ABNORMAL HIGH (ref 19–32)
Calcium: 9.5 mg/dL (ref 8.4–10.5)
Chloride: 95 mEq/L — ABNORMAL LOW (ref 96–112)
Creatinine, Ser: 1.41 mg/dL (ref 0.40–1.50)
GFR: 48.94 mL/min — ABNORMAL LOW (ref >60.00)
Glucose, Bld: 96 mg/dL (ref 70–99)
Potassium: 5.3 mEq/L — ABNORMAL HIGH (ref 3.5–5.1)
Sodium: 135 mEq/L (ref 135–145)

## 2019-07-27 LAB — CBC WITH DIFFERENTIAL/PLATELET
Basophils Absolute: 0 10*3/uL (ref 0.0–0.1)
Basophils Relative: 0.5 % (ref 0.0–3.0)
Eosinophils Absolute: 0.3 10*3/uL (ref 0.0–0.7)
Eosinophils Relative: 3.6 % (ref 0.0–5.0)
HCT: 34.3 % — ABNORMAL LOW (ref 39.0–52.0)
Hemoglobin: 11.2 g/dL — ABNORMAL LOW (ref 13.0–17.0)
Lymphocytes Relative: 19.9 % (ref 12.0–46.0)
Lymphs Abs: 1.7 10*3/uL (ref 0.7–4.0)
MCHC: 32.6 g/dL (ref 30.0–36.0)
MCV: 89.5 fl (ref 78.0–100.0)
Monocytes Absolute: 1.3 10*3/uL — ABNORMAL HIGH (ref 0.1–1.0)
Monocytes Relative: 14.9 % — ABNORMAL HIGH (ref 3.0–12.0)
Neutro Abs: 5.2 10*3/uL (ref 1.4–7.7)
Neutrophils Relative %: 61.1 % (ref 43.0–77.0)
Platelets: 201 10*3/uL (ref 150.0–400.0)
RBC: 3.83 Mil/uL — ABNORMAL LOW (ref 4.22–5.81)
RDW: 15.3 % (ref 11.5–15.5)
WBC: 8.4 10*3/uL (ref 4.0–10.5)

## 2019-07-27 LAB — HEPATIC FUNCTION PANEL
ALT: 8 U/L (ref 0–53)
AST: 11 U/L (ref 0–37)
Albumin: 4.2 g/dL (ref 3.5–5.2)
Alkaline Phosphatase: 85 U/L (ref 39–117)
Bilirubin, Direct: 0.1 mg/dL (ref 0.0–0.3)
Total Bilirubin: 0.3 mg/dL (ref 0.2–1.2)
Total Protein: 6.7 g/dL (ref 6.0–8.3)

## 2019-07-27 LAB — TSH: TSH: 4.52 u[IU]/mL — ABNORMAL HIGH (ref 0.35–4.50)

## 2019-07-27 LAB — LDL CHOLESTEROL, DIRECT: Direct LDL: 100 mg/dL

## 2019-08-22 ENCOUNTER — Other Ambulatory Visit: Payer: Self-pay | Admitting: Family Medicine

## 2019-08-22 DIAGNOSIS — I5022 Chronic systolic (congestive) heart failure: Secondary | ICD-10-CM

## 2019-09-10 ENCOUNTER — Telehealth: Payer: Self-pay

## 2019-09-10 DIAGNOSIS — J449 Chronic obstructive pulmonary disease, unspecified: Secondary | ICD-10-CM

## 2019-09-10 NOTE — Telephone Encounter (Signed)
Copied from [place] [id number]. Topic: General - Other >> Sep 10, 2019  3:42 PM Wynetta Emery, [place] C wrote: Reason for CRM: pt says that he discuss with provider having a home health nurse coming out to the home.   Pt would like to have this service and would like to have further assistance.   Please advise pt.

## 2019-09-10 NOTE — Telephone Encounter (Signed)
I have placed order for home health.  Let him know.

## 2019-09-10 NOTE — Telephone Encounter (Signed)
Please see message. °

## 2019-09-11 NOTE — Telephone Encounter (Signed)
Called patient and let him know that Dr. Elease Hashimoto has placed the in home health order for him. Patient verbalized an understanding.

## 2019-09-13 ENCOUNTER — Other Ambulatory Visit: Payer: Self-pay | Admitting: Family Medicine

## 2019-09-16 DIAGNOSIS — N183 Chronic kidney disease, stage 3 unspecified: Secondary | ICD-10-CM | POA: Diagnosis not present

## 2019-09-16 DIAGNOSIS — I130 Hypertensive heart and chronic kidney disease with heart failure and stage 1 through stage 4 chronic kidney disease, or unspecified chronic kidney disease: Secondary | ICD-10-CM | POA: Diagnosis not present

## 2019-09-16 DIAGNOSIS — I255 Ischemic cardiomyopathy: Secondary | ICD-10-CM | POA: Diagnosis not present

## 2019-09-16 DIAGNOSIS — I2510 Atherosclerotic heart disease of native coronary artery without angina pectoris: Secondary | ICD-10-CM | POA: Diagnosis not present

## 2019-09-16 DIAGNOSIS — M150 Primary generalized (osteo)arthritis: Secondary | ICD-10-CM | POA: Diagnosis not present

## 2019-09-16 DIAGNOSIS — I739 Peripheral vascular disease, unspecified: Secondary | ICD-10-CM | POA: Diagnosis not present

## 2019-09-16 DIAGNOSIS — I5042 Chronic combined systolic (congestive) and diastolic (congestive) heart failure: Secondary | ICD-10-CM | POA: Diagnosis not present

## 2019-09-16 DIAGNOSIS — J309 Allergic rhinitis, unspecified: Secondary | ICD-10-CM | POA: Diagnosis not present

## 2019-09-16 DIAGNOSIS — J449 Chronic obstructive pulmonary disease, unspecified: Secondary | ICD-10-CM | POA: Diagnosis not present

## 2019-09-17 LAB — LIPID PANEL
Cholesterol: 183 (ref 0–200)
HDL: 47 (ref 35–70)
LDL Cholesterol: 117
Triglycerides: 105 (ref 40–160)

## 2019-09-17 LAB — COMPREHENSIVE METABOLIC PANEL
Albumin: 4.3 (ref 3.5–5.0)
Calcium: 9.3 (ref 8.7–10.7)
GFR calc Af Amer: 57
GFR calc non Af Amer: 50
Globulin: 2.4

## 2019-09-17 LAB — BASIC METABOLIC PANEL
BUN: 19 (ref 4–21)
CO2: 33 — AB (ref 13–22)
Chloride: 90 — AB (ref 99–108)
Creatinine: 1.4 — AB (ref <=1.3)
Glucose: 147
Potassium: 4.4 (ref 3.4–5.3)
Sodium: 134 — AB (ref 137–147)

## 2019-09-17 LAB — HEPATIC FUNCTION PANEL
ALT: 8 — AB (ref 10–40)
AST: 12 — AB (ref 14–40)
Alkaline Phosphatase: 94 (ref 25–125)
Bilirubin, Total: 0.3

## 2019-09-17 LAB — TSH: TSH: 6.35 — AB (ref <=5.90)

## 2019-09-20 DIAGNOSIS — I2510 Atherosclerotic heart disease of native coronary artery without angina pectoris: Secondary | ICD-10-CM | POA: Diagnosis not present

## 2019-09-20 DIAGNOSIS — J309 Allergic rhinitis, unspecified: Secondary | ICD-10-CM | POA: Diagnosis not present

## 2019-09-20 DIAGNOSIS — I739 Peripheral vascular disease, unspecified: Secondary | ICD-10-CM | POA: Diagnosis not present

## 2019-09-20 DIAGNOSIS — N183 Chronic kidney disease, stage 3 unspecified: Secondary | ICD-10-CM | POA: Diagnosis not present

## 2019-09-20 DIAGNOSIS — I5042 Chronic combined systolic (congestive) and diastolic (congestive) heart failure: Secondary | ICD-10-CM | POA: Diagnosis not present

## 2019-09-20 DIAGNOSIS — I255 Ischemic cardiomyopathy: Secondary | ICD-10-CM | POA: Diagnosis not present

## 2019-09-20 DIAGNOSIS — J449 Chronic obstructive pulmonary disease, unspecified: Secondary | ICD-10-CM | POA: Diagnosis not present

## 2019-09-20 DIAGNOSIS — I130 Hypertensive heart and chronic kidney disease with heart failure and stage 1 through stage 4 chronic kidney disease, or unspecified chronic kidney disease: Secondary | ICD-10-CM | POA: Diagnosis not present

## 2019-09-20 DIAGNOSIS — M150 Primary generalized (osteo)arthritis: Secondary | ICD-10-CM | POA: Diagnosis not present

## 2019-09-25 DIAGNOSIS — I5042 Chronic combined systolic (congestive) and diastolic (congestive) heart failure: Secondary | ICD-10-CM | POA: Diagnosis not present

## 2019-09-25 DIAGNOSIS — J309 Allergic rhinitis, unspecified: Secondary | ICD-10-CM | POA: Diagnosis not present

## 2019-09-25 DIAGNOSIS — I2510 Atherosclerotic heart disease of native coronary artery without angina pectoris: Secondary | ICD-10-CM | POA: Diagnosis not present

## 2019-09-25 DIAGNOSIS — I130 Hypertensive heart and chronic kidney disease with heart failure and stage 1 through stage 4 chronic kidney disease, or unspecified chronic kidney disease: Secondary | ICD-10-CM | POA: Diagnosis not present

## 2019-09-25 DIAGNOSIS — I739 Peripheral vascular disease, unspecified: Secondary | ICD-10-CM | POA: Diagnosis not present

## 2019-09-25 DIAGNOSIS — M150 Primary generalized (osteo)arthritis: Secondary | ICD-10-CM | POA: Diagnosis not present

## 2019-09-25 DIAGNOSIS — I255 Ischemic cardiomyopathy: Secondary | ICD-10-CM | POA: Diagnosis not present

## 2019-09-25 DIAGNOSIS — N183 Chronic kidney disease, stage 3 unspecified: Secondary | ICD-10-CM | POA: Diagnosis not present

## 2019-09-25 DIAGNOSIS — J449 Chronic obstructive pulmonary disease, unspecified: Secondary | ICD-10-CM | POA: Diagnosis not present

## 2019-09-27 ENCOUNTER — Other Ambulatory Visit: Payer: Self-pay | Admitting: Family Medicine

## 2019-09-27 DIAGNOSIS — I5022 Chronic systolic (congestive) heart failure: Secondary | ICD-10-CM

## 2019-10-12 DIAGNOSIS — I2510 Atherosclerotic heart disease of native coronary artery without angina pectoris: Secondary | ICD-10-CM | POA: Diagnosis not present

## 2019-10-12 DIAGNOSIS — J449 Chronic obstructive pulmonary disease, unspecified: Secondary | ICD-10-CM | POA: Diagnosis not present

## 2019-10-12 DIAGNOSIS — I5042 Chronic combined systolic (congestive) and diastolic (congestive) heart failure: Secondary | ICD-10-CM | POA: Diagnosis not present

## 2019-10-12 DIAGNOSIS — M150 Primary generalized (osteo)arthritis: Secondary | ICD-10-CM | POA: Diagnosis not present

## 2019-10-12 DIAGNOSIS — I255 Ischemic cardiomyopathy: Secondary | ICD-10-CM | POA: Diagnosis not present

## 2019-10-12 DIAGNOSIS — J309 Allergic rhinitis, unspecified: Secondary | ICD-10-CM | POA: Diagnosis not present

## 2019-10-12 DIAGNOSIS — I739 Peripheral vascular disease, unspecified: Secondary | ICD-10-CM | POA: Diagnosis not present

## 2019-10-12 DIAGNOSIS — I130 Hypertensive heart and chronic kidney disease with heart failure and stage 1 through stage 4 chronic kidney disease, or unspecified chronic kidney disease: Secondary | ICD-10-CM | POA: Diagnosis not present

## 2019-10-12 DIAGNOSIS — N183 Chronic kidney disease, stage 3 unspecified: Secondary | ICD-10-CM | POA: Diagnosis not present

## 2019-11-29 ENCOUNTER — Other Ambulatory Visit: Payer: Self-pay | Admitting: Family Medicine

## 2019-12-03 ENCOUNTER — Other Ambulatory Visit: Payer: Self-pay | Admitting: Family Medicine

## 2019-12-03 DIAGNOSIS — I5022 Chronic systolic (congestive) heart failure: Secondary | ICD-10-CM

## 2019-12-04 ENCOUNTER — Encounter: Payer: Self-pay | Admitting: *Deleted

## 2020-02-26 ENCOUNTER — Telehealth: Payer: Self-pay | Admitting: Family Medicine

## 2020-02-26 NOTE — Telephone Encounter (Signed)
Pt is requesting that Dr. Elease Hashimoto call [place] and have his oxygen upped to 3%.

## 2020-02-26 NOTE — Telephone Encounter (Signed)
Set up Doxy-or phone follow-up

## 2020-02-26 NOTE — Telephone Encounter (Signed)
Can you schedule this patient for Doxy or in office visit per Dr. Elease Hashimoto? Let him know that Dr. Elease Hashimoto will need to document for coverage for the change. Thank you!

## 2020-02-26 NOTE — Telephone Encounter (Signed)
Do you want to do a Doxy or In Office visit for this?

## 2020-02-27 NOTE — Telephone Encounter (Signed)
Patient was given the wrong oxygen machine yesterday and they are supposed to be bringing a new one out today so he will call back to schedule a telephone visit with Dr. Elease Hashimoto to adjust his oxygen.

## 2020-03-11 ENCOUNTER — Telehealth (INDEPENDENT_AMBULATORY_CARE_PROVIDER_SITE_OTHER): Payer: Medicare HMO | Admitting: Family Medicine

## 2020-03-11 ENCOUNTER — Other Ambulatory Visit: Payer: Self-pay

## 2020-03-11 DIAGNOSIS — I2510 Atherosclerotic heart disease of native coronary artery without angina pectoris: Secondary | ICD-10-CM

## 2020-03-11 DIAGNOSIS — I5022 Chronic systolic (congestive) heart failure: Secondary | ICD-10-CM | POA: Diagnosis not present

## 2020-03-11 DIAGNOSIS — J449 Chronic obstructive pulmonary disease, unspecified: Secondary | ICD-10-CM | POA: Diagnosis not present

## 2020-03-11 NOTE — Progress Notes (Signed)
This visit type was conducted due to national recommendations for restrictions regarding the COVID-19 pandemic in an effort to limit this patient's exposure and mitigate transmission in our community.   Virtual Visit via Telephone Note  I connected with Douglas Mann on 03/11/20 at  4:30 PM EDT by telephone and verified that I am speaking with the correct person using two identifiers.   I discussed the limitations, risks, security and privacy concerns of performing an evaluation and management service by telephone and the availability of in person appointments. I also discussed with the patient that there may be a patient responsible charge related to this service. The patient expressed understanding and agreed to proceed.  Location patient: home Location provider: work or home office Participants present for the call: patient, provider Patient did not have a visit in the prior 7 days to address this/these issue(s).   History of Present Illness: Douglas Mann has multiple chronic problems including history of severe COPD, peripheral artery disease, osteoarthritis, CAD history, hypothyroidism, hypertension, chronic systolic heart failure.  He has been remarkably stable for the past several years.  He has been very isolated during the pandemic.  He has 1 gentleman that helps take care of him basically but does not go anywhere.  He has not decided whether he will get the vaccine yet.  He states his respiratory status is at baseline.  Major complaint is difficulty sleeping at night.  However, he takes several daytime naps.  Does not use caffeine after about 9 AM.  No alcohol use at night.  Had lab work in September.  This was reviewed.  His TSH was up at 6.35.  Other labs stable.  Denies any recent falls.  Appetite and weight stable.  Patient remains very reluctant to come to office at this time.    Observations/Objective: Patient sounds cheerful and well on the phone. I do not appreciate any  SOB. Speech and thought processing are grossly intact. Patient reported vitals:  Assessment and Plan:  #1 severe oxygen dependent COPD.-Currently stable -Patient encouraged to consider Covid vaccine but he remains reluctant  #2 hypertension history.  Currently not monitoring  #3 hypothyroidism.  TSH was slightly elevated in September.  Recommend follow-up TSH within the next few months  #4 chronic insomnia -Sleep hygiene discussed.  We explained that his frequent daytime napping is likely worsening his insomnia.  He will try to eliminate that first  #5 hyperlipidemia.  Remains on Lipitor. -Recent lipid results reviewed and stable  Follow Up Instructions:  -Recommend office follow-up preferably within the next 6 months.   99441 5-10 99442 11-20 99443 21-30 I did not refer this patient for an OV in the next 24 hours for this/these issue(s).  I discussed the assessment and treatment plan with the patient. The patient was provided an opportunity to ask questions and all were answered. The patient agreed with the plan and demonstrated an understanding of the instructions.   The patient was advised to call back or seek an in-person evaluation if the symptoms worsen or if the condition fails to improve as anticipated.  I provided 25 minutes of non-face-to-face time during this encounter.   Carolann Littler, MD

## 2020-03-12 ENCOUNTER — Telehealth: Payer: Self-pay | Admitting: *Deleted

## 2020-03-12 NOTE — Telephone Encounter (Signed)
Pt has been advised and has understanding

## 2020-03-12 NOTE — Telephone Encounter (Signed)
Safest option is OTC Tylenol every 6-8 hours as needed.

## 2020-03-12 NOTE — Telephone Encounter (Signed)
Please advise 

## 2020-03-12 NOTE — Telephone Encounter (Signed)
Patient called after hours line. Patient reports he had a virtual visit with PCP but forgot to tell the doctor that his joints have been hurting and if something could be prescribe.

## 2020-04-08 ENCOUNTER — Telehealth: Payer: Self-pay | Admitting: Family Medicine

## 2020-04-08 MED ORDER — TAMSULOSIN HCL 0.4 MG PO CAPS: 0.4000 mg | ORAL_CAPSULE | Freq: Every day | ORAL | 1 refills | Status: DC

## 2020-04-08 NOTE — Telephone Encounter (Signed)
Per med list, he is already on the Flomax.  May refill

## 2020-04-08 NOTE — Telephone Encounter (Signed)
This has been sent in to [facility] as pt requested

## 2020-04-08 NOTE — Telephone Encounter (Signed)
Please advise 

## 2020-04-08 NOTE — Telephone Encounter (Signed)
Pt would like to know if Dr. Elease Hashimoto, would be willing to prescribe Flomax? Per pt, Dr. Elease Hashimoto is aware of the medication he is requesting because he recommended the medication in his last visit. Thanks

## 2020-05-16 ENCOUNTER — Telehealth: Payer: Self-pay | Admitting: *Deleted

## 2020-05-16 DIAGNOSIS — I5022 Chronic systolic (congestive) heart failure: Secondary | ICD-10-CM

## 2020-05-16 MED ORDER — FUROSEMIDE 40 MG PO TABS: ORAL_TABLET | ORAL | 1 refills | Status: AC

## 2020-05-16 MED ORDER — FLUTICASONE-SALMETEROL 500-50 MCG/DOSE IN AEPB: 1.0000 | INHALATION_SPRAY | Freq: Two times a day (BID) | RESPIRATORY_TRACT | 3 refills | Status: AC

## 2020-05-16 MED ORDER — LOSARTAN POTASSIUM 50 MG PO TABS: 50.0000 mg | ORAL_TABLET | Freq: Every day | ORAL | 0 refills | Status: DC

## 2020-05-16 MED ORDER — RANOLAZINE ER 500 MG PO TB12: 500.0000 mg | ORAL_TABLET | Freq: Two times a day (BID) | ORAL | 0 refills | Status: AC

## 2020-05-16 MED ORDER — AMIODARONE HCL 200 MG PO TABS: ORAL_TABLET | ORAL | 1 refills | Status: AC

## 2020-05-16 MED ORDER — TAMSULOSIN HCL 0.4 MG PO CAPS: 0.4000 mg | ORAL_CAPSULE | Freq: Every day | ORAL | 1 refills | Status: AC

## 2020-05-16 MED ORDER — CARVEDILOL 3.125 MG PO TABS: 3.1250 mg | ORAL_TABLET | Freq: Two times a day (BID) | ORAL | 0 refills | Status: AC

## 2020-05-16 MED ORDER — ATORVASTATIN CALCIUM 40 MG PO TABS: 40.0000 mg | ORAL_TABLET | Freq: Every day | ORAL | 1 refills | Status: AC

## 2020-05-16 MED ORDER — CLOPIDOGREL BISULFATE 75 MG PO TABS: 75.0000 mg | ORAL_TABLET | Freq: Every day | ORAL | 1 refills | Status: AC

## 2020-05-16 MED ORDER — LEVOTHYROXINE SODIUM 25 MCG PO TABS: 25.0000 ug | ORAL_TABLET | Freq: Every day | ORAL | 1 refills | Status: AC

## 2020-05-16 NOTE — Telephone Encounter (Signed)
Prescriptions have been sent in. Patient is aware.

## 2020-05-16 NOTE — Telephone Encounter (Signed)
Patient spoke with after hours line. Patient reports pharmacy doesn't have orders for them that were suppose to be ordered two weeks ago medication. Patient reports he is to run out of his 11 medications and he is going to get a refill for all of them. Patient stated that he is needing to get his doctor to write a prescription for a refill. Patient denies any new or worsening symptoms.

## 2020-05-16 NOTE — Telephone Encounter (Signed)
levothyroxine (SYNTHROID) 25 MCG tablet losartan (COZAAR) 50 MG tablet ranolazine (RANEXA) 500 MG 12 hr tablet furosemide (LASIX) 40 MG tablet carvedilol (COREG) 3.125 MG tablet amiodarone (PACERONE) 200 MG tablet tamsulosin (FLOMAX) 0.4 MG CAPS capsule WIXELA INHUB 500-50 MCG/DOSE AEPB atorvastatin (LIPITOR) 40 MG tablet clopidogrel (PLAVIX) 75 MG tablet  Patient is needing all of his Rx seen to [facility] as soon as possible.  [place], [place] Phone:  [phone]  Fax:  [phone]

## 2020-05-26 ENCOUNTER — Telehealth: Payer: Self-pay | Admitting: *Deleted

## 2020-05-26 NOTE — Telephone Encounter (Signed)
Patient contacted after hours line on 05/25/2020. Patient reports the doctor did not write a refill on one of his prescriptions. Ranexa. States he has enough for the next 4 -5 days. Reports no new or worsening symptoms.

## 2020-05-26 NOTE — Telephone Encounter (Signed)
Pt  has been notified that prescription was sent in on 05/16/20 and to contact pharmacy

## 2020-06-03 ENCOUNTER — Telehealth: Payer: Self-pay | Admitting: Family Medicine

## 2020-06-03 NOTE — Telephone Encounter (Signed)
Pt called to get fax number and he will be faxing over forms.

## 2020-07-24 ENCOUNTER — Telehealth: Payer: Self-pay | Admitting: Family Medicine

## 2020-07-24 NOTE — Telephone Encounter (Signed)
Pt call and stated he would like for dr.Burchette to call he in a stronger laxatives to  [place], [address] AT [address] [place] Phone:  [phone]  Fax:  [phone]     and want a call to let him know that it was sent.

## 2020-07-25 MED ORDER — SENNOSIDES-DOCUSATE SODIUM 8.6-50 MG PO TABS
1.0000 | ORAL_TABLET | ORAL | 1 refills | Status: DC
Start: 2020-07-25 — End: 2020-09-12

## 2020-07-25 NOTE — Telephone Encounter (Signed)
Please advise 

## 2020-07-25 NOTE — Telephone Encounter (Signed)
Do we know what he is using?  Recommend Miralax 17 grams once daily as needed for constipation- if he has not already tried.

## 2020-07-25 NOTE — Telephone Encounter (Signed)
Add Senokot S-1 to 2 tablets daily

## 2020-07-25 NOTE — Telephone Encounter (Signed)
Pt states he has taken miralax over the counter and that its not helping

## 2020-07-25 NOTE — Telephone Encounter (Signed)
Pt notified this was sent in

## 2020-07-29 ENCOUNTER — Telehealth: Payer: Self-pay | Admitting: Family Medicine

## 2020-07-29 DIAGNOSIS — J449 Chronic obstructive pulmonary disease, unspecified: Secondary | ICD-10-CM

## 2020-07-29 NOTE — Telephone Encounter (Signed)
Pt needs order for oxygen placed since it has been 5 years

## 2020-07-29 NOTE — Telephone Encounter (Signed)
Pt called to give number to call [employer] [phone]   He received a letter in the mail and the letter stated to give this number to the provider.  He couldn't give me anymore information about what the letter says.   Please advise

## 2020-07-29 NOTE — Telephone Encounter (Signed)
Orders have been placed.

## 2020-07-29 NOTE — Telephone Encounter (Signed)
OK to order 

## 2020-08-11 ENCOUNTER — Telehealth: Payer: Self-pay | Admitting: Family Medicine

## 2020-08-11 NOTE — Telephone Encounter (Signed)
Pt call and want a call back to talk about sending someone to help him and he stated that he is on oxygen 24/7.

## 2020-08-12 NOTE — Telephone Encounter (Signed)
Need more specific information.  Is here requesting respiratory therapy?  Maybe we should set up virtual follow-up to discuss what his specific requests are

## 2020-08-12 NOTE — Telephone Encounter (Signed)
Please advise pt would like home health

## 2020-08-12 NOTE — Telephone Encounter (Signed)
Pt scheduled to discuss

## 2020-08-13 ENCOUNTER — Telehealth (INDEPENDENT_AMBULATORY_CARE_PROVIDER_SITE_OTHER): Payer: Medicare HMO | Admitting: Family Medicine

## 2020-08-13 ENCOUNTER — Other Ambulatory Visit: Payer: Self-pay

## 2020-08-13 ENCOUNTER — Other Ambulatory Visit: Payer: Self-pay | Admitting: Family Medicine

## 2020-08-13 DIAGNOSIS — I10 Essential (primary) hypertension: Secondary | ICD-10-CM

## 2020-08-13 DIAGNOSIS — I5022 Chronic systolic (congestive) heart failure: Secondary | ICD-10-CM

## 2020-08-13 DIAGNOSIS — J449 Chronic obstructive pulmonary disease, unspecified: Secondary | ICD-10-CM

## 2020-08-13 NOTE — Progress Notes (Signed)
Patient ID: Douglas Mann, male   DOB: March 30, 1946, 74 y.o.   MRN: [id number]  This visit type was conducted due to national recommendations for restrictions regarding the COVID-19 pandemic in an effort to limit this patient's exposure and mitigate transmission in our community.   Virtual Visit via Telephone Note  I connected with Douglas Mann on 08/13/20 at  4:00 PM EDT by telephone and verified that I am speaking with the correct person using two identifiers.   I discussed the limitations, risks, security and privacy concerns of performing an evaluation and management service by telephone and the availability of in person appointments. I also discussed with the patient that there may be a patient responsible charge related to this service. The patient expressed understanding and agreed to proceed.  Location patient: home Location provider: work or home office Participants present for the call: patient, provider Patient did not have a visit in the prior 7 days to address this/these issue(s).   History of Present Illness: Douglas Mann has multiple chronic problems including combined systolic and diastolic heart failure, history of CAD, hypertension, peripheral artery disease, severe COPD on chronic home oxygen, hypothyroidism, chronic kidney disease, hyperlipidemia, normocytic anemia  He feels relatively stable with regard to his COPD.  He is concerned though because he apparently was contacted by his insurance company that there was the threat of him losing his oxygen if there is no documentation of his ongoing needs.  He uses his oxygen 24/7 and has been extremely dependent on this for some time.  We have sent out home health in the past for overnight pulse oximetry.  He is requesting the same.  His last labs were last September and he will need to get some follow-up for those soon.  It is my medical opinion that without oxygen he could not survive for long   Multiple medical problems needing follow up  labs- hypothyroid, hyperlipidemia, CKD.  Past Medical History:  Diagnosis Date  . ALLERGIC RHINITIS 12/24/2009  . CAD 10/01/2009   Stent at [facility] greater than 10 years ago  . COPD 10/01/2009  . HYPERLIPIDEMIA 10/01/2009  . HYPERTENSION 10/01/2009  . On home oxygen therapy    "3.5 - 4L; 24/7" (08/29/2017)  . 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 PVD 10/01/2009   Past Surgical History:  Procedure Laterality Date  . CARDIAC CATHETERIZATION  08/16/2014   Procedure: IABP INSERTION;  Surgeon: Leonie Man, MD;  Location[facility] CATH LAB;  Service: Cardiovascular;;  . FRACTURE SURGERY  2012   ORIF r tibia fracture  . LEFT HEART CATH Bilateral 08/15/2014   Procedure: LEFT HEART CATH;  Surgeon: Leonie Man, MD;  Location: [facility] CATH LAB;  Service: Cardiovascular;  Laterality: Bilateral;  . PERCUTANEOUS CORONARY STENT INTERVENTION (PCI-S) N/A 08/16/2014   Procedure: PERCUTANEOUS CORONARY STENT INTERVENTION (PCI-S);  Surgeon: Leonie Man, MD;  Location: [facility] CATH LAB;  Service: Cardiovascular;  Laterality: N/A;  . TONSILLECTOMY AND ADENOIDECTOMY      reports that he has quit smoking. His smoking use included cigarettes. He has a 75.00 pack-year smoking history. He has never used smokeless tobacco. He reports that he does not drink alcohol and does not use drugs. family history includes CAD in his father. No Known Allergies    Observations/Objective: Patient sounds cheerful and well on the phone. I do not appreciate any SOB. Speech and thought processing are grossly intact. Patient reported vitals:  Assessment and Plan:  #1 severe COPD dependent on home O2  -  Set up home health referral for respiratory therapy to go out and document oxygen levels. -Not having his oxygen would be very detrimental and life-threatening to this gentleman with severe COPD  #2 hypothyroidism.  Patient on replacement.  Needs follow-up labs soon  #3 history of chronic kidney disease stage  III -Recheck basic metabolic panel at follow-up  #4 hyperlipidemia.  Goal LDL less than 70. -Recheck lipid and hepatic panel at follow-up  Follow Up Instructions:  -As above   99441 5-10 99442 11-20 99443 21-30 I did not refer this patient for an OV in the next 24 hours for this/these issue(s).  I discussed the assessment and treatment plan with the patient. The patient was provided an opportunity to ask questions and all were answered. The patient agreed with the plan and demonstrated an understanding of the instructions.   The patient was advised to call back or seek an in-person evaluation if the symptoms worsen or if the condition fails to improve as anticipated.  I provided 25 minutes of non-face-to-face time during this encounter.   Carolann Littler, MD

## 2020-08-28 ENCOUNTER — Other Ambulatory Visit: Payer: Self-pay | Admitting: Family Medicine

## 2020-08-28 DIAGNOSIS — J449 Chronic obstructive pulmonary disease, unspecified: Secondary | ICD-10-CM

## 2020-09-09 ENCOUNTER — Telehealth: Payer: Self-pay | Admitting: Family Medicine

## 2020-09-09 NOTE — Progress Notes (Signed)
  Chronic Care Management   Note  09/09/2020 Name: Douglas Mann MRN: [id number] DOB: 1946-05-10  Douglas Mann is a 74 y.o. year old male who is a primary care patient of Burchette, Alinda Sierras, Douglas Mann. I reached out to [place] by phone today in response to a referral sent by Douglas Mann, Douglas Post, Douglas Mann.   Douglas Mann was given information about Chronic Care Management services today including:  1. CCM service includes personalized support from designated clinical staff supervised by his physician, including individualized plan of care and coordination with other care providers 2. 24/7 contact phone numbers for assistance for urgent and routine care needs. 3. Service will only be billed when office clinical staff spend 20 minutes or more in a month to coordinate care. 4. Only one practitioner may furnish and bill the service in a calendar month. 5. The patient may stop CCM services at any time (effective at the end of the month) by phone call to the office staff.   Patient agreed to services and verbal consent obtained.   Follow up plan:   Carley Perdue [employer] Scheduler

## 2020-09-12 ENCOUNTER — Inpatient Hospital Stay (HOSPITAL_COMMUNITY)
Admission: EM | Admit: 2020-09-12 | Discharge: 2020-09-19 | DRG: 951 | Disposition: E | Payer: Medicare HMO | Attending: Internal Medicine | Admitting: Internal Medicine

## 2020-09-12 ENCOUNTER — Other Ambulatory Visit: Payer: Self-pay

## 2020-09-12 ENCOUNTER — Emergency Department (HOSPITAL_COMMUNITY): Payer: Medicare HMO

## 2020-09-12 ENCOUNTER — Encounter (HOSPITAL_COMMUNITY): Payer: Self-pay

## 2020-09-12 DIAGNOSIS — Z800 Family history of malignant neoplasm of digestive organs: Secondary | ICD-10-CM

## 2020-09-12 DIAGNOSIS — R918 Other nonspecific abnormal finding of lung field: Secondary | ICD-10-CM | POA: Diagnosis not present

## 2020-09-12 DIAGNOSIS — D62 Acute posthemorrhagic anemia: Secondary | ICD-10-CM | POA: Diagnosis not present

## 2020-09-12 DIAGNOSIS — I5042 Chronic combined systolic (congestive) and diastolic (congestive) heart failure: Secondary | ICD-10-CM | POA: Diagnosis present

## 2020-09-12 DIAGNOSIS — I5022 Chronic systolic (congestive) heart failure: Secondary | ICD-10-CM | POA: Diagnosis present

## 2020-09-12 DIAGNOSIS — E785 Hyperlipidemia, unspecified: Secondary | ICD-10-CM | POA: Diagnosis not present

## 2020-09-12 DIAGNOSIS — Z7982 Long term (current) use of aspirin: Secondary | ICD-10-CM

## 2020-09-12 DIAGNOSIS — Z6824 Body mass index (BMI) 24.0-24.9, adult: Secondary | ICD-10-CM

## 2020-09-12 DIAGNOSIS — I130 Hypertensive heart and chronic kidney disease with heart failure and stage 1 through stage 4 chronic kidney disease, or unspecified chronic kidney disease: Secondary | ICD-10-CM | POA: Diagnosis present

## 2020-09-12 DIAGNOSIS — Z7189 Other specified counseling: Secondary | ICD-10-CM | POA: Diagnosis not present

## 2020-09-12 DIAGNOSIS — I76 Septic arterial embolism: Secondary | ICD-10-CM | POA: Diagnosis present

## 2020-09-12 DIAGNOSIS — J90 Pleural effusion, not elsewhere classified: Secondary | ICD-10-CM | POA: Diagnosis not present

## 2020-09-12 DIAGNOSIS — N1832 Chronic kidney disease, stage 3b: Secondary | ICD-10-CM | POA: Diagnosis present

## 2020-09-12 DIAGNOSIS — J189 Pneumonia, unspecified organism: Secondary | ICD-10-CM | POA: Diagnosis not present

## 2020-09-12 DIAGNOSIS — Z7902 Long term (current) use of antithrombotics/antiplatelets: Secondary | ICD-10-CM

## 2020-09-12 DIAGNOSIS — K922 Gastrointestinal hemorrhage, unspecified: Secondary | ICD-10-CM

## 2020-09-12 DIAGNOSIS — N183 Chronic kidney disease, stage 3 unspecified: Secondary | ICD-10-CM | POA: Diagnosis present

## 2020-09-12 DIAGNOSIS — Z9981 Dependence on supplemental oxygen: Secondary | ICD-10-CM

## 2020-09-12 DIAGNOSIS — K631 Perforation of intestine (nontraumatic): Secondary | ICD-10-CM | POA: Diagnosis present

## 2020-09-12 DIAGNOSIS — R5381 Other malaise: Secondary | ICD-10-CM | POA: Diagnosis present

## 2020-09-12 DIAGNOSIS — R062 Wheezing: Secondary | ICD-10-CM | POA: Diagnosis not present

## 2020-09-12 DIAGNOSIS — R627 Adult failure to thrive: Secondary | ICD-10-CM | POA: Diagnosis present

## 2020-09-12 DIAGNOSIS — L89152 Pressure ulcer of sacral region, stage 2: Secondary | ICD-10-CM | POA: Diagnosis present

## 2020-09-12 DIAGNOSIS — F0390 Unspecified dementia without behavioral disturbance: Secondary | ICD-10-CM | POA: Diagnosis present

## 2020-09-12 DIAGNOSIS — Z20822 Contact with and (suspected) exposure to covid-19: Secondary | ICD-10-CM | POA: Diagnosis not present

## 2020-09-12 DIAGNOSIS — Z66 Do not resuscitate: Secondary | ICD-10-CM | POA: Diagnosis present

## 2020-09-12 DIAGNOSIS — Z789 Other specified health status: Secondary | ICD-10-CM | POA: Diagnosis not present

## 2020-09-12 DIAGNOSIS — I252 Old myocardial infarction: Secondary | ICD-10-CM

## 2020-09-12 DIAGNOSIS — J449 Chronic obstructive pulmonary disease, unspecified: Secondary | ICD-10-CM | POA: Diagnosis not present

## 2020-09-12 DIAGNOSIS — Z7401 Bed confinement status: Secondary | ICD-10-CM

## 2020-09-12 DIAGNOSIS — R64 Cachexia: Secondary | ICD-10-CM | POA: Diagnosis present

## 2020-09-12 DIAGNOSIS — K921 Melena: Secondary | ICD-10-CM | POA: Diagnosis present

## 2020-09-12 DIAGNOSIS — J9610 Chronic respiratory failure, unspecified whether with hypoxia or hypercapnia: Secondary | ICD-10-CM | POA: Diagnosis present

## 2020-09-12 DIAGNOSIS — I10 Essential (primary) hypertension: Secondary | ICD-10-CM | POA: Diagnosis present

## 2020-09-12 DIAGNOSIS — R0689 Other abnormalities of breathing: Secondary | ICD-10-CM | POA: Diagnosis not present

## 2020-09-12 DIAGNOSIS — Z515 Encounter for palliative care: Secondary | ICD-10-CM | POA: Diagnosis not present

## 2020-09-12 DIAGNOSIS — I739 Peripheral vascular disease, unspecified: Secondary | ICD-10-CM | POA: Diagnosis present

## 2020-09-12 DIAGNOSIS — N179 Acute kidney failure, unspecified: Secondary | ICD-10-CM | POA: Diagnosis not present

## 2020-09-12 DIAGNOSIS — Z8249 Family history of ischemic heart disease and other diseases of the circulatory system: Secondary | ICD-10-CM

## 2020-09-12 DIAGNOSIS — E43 Unspecified severe protein-calorie malnutrition: Secondary | ICD-10-CM | POA: Diagnosis not present

## 2020-09-12 DIAGNOSIS — E872 Acidosis: Secondary | ICD-10-CM | POA: Diagnosis present

## 2020-09-12 DIAGNOSIS — Z87891 Personal history of nicotine dependence: Secondary | ICD-10-CM

## 2020-09-12 DIAGNOSIS — R680 Hypothermia, not associated with low environmental temperature: Secondary | ICD-10-CM | POA: Diagnosis present

## 2020-09-12 DIAGNOSIS — M1990 Unspecified osteoarthritis, unspecified site: Secondary | ICD-10-CM | POA: Diagnosis present

## 2020-09-12 DIAGNOSIS — J850 Gangrene and necrosis of lung: Secondary | ICD-10-CM | POA: Diagnosis present

## 2020-09-12 DIAGNOSIS — Z79899 Other long term (current) drug therapy: Secondary | ICD-10-CM

## 2020-09-12 DIAGNOSIS — L8990 Pressure ulcer of unspecified site, unspecified stage: Secondary | ICD-10-CM | POA: Insufficient documentation

## 2020-09-12 DIAGNOSIS — I2510 Atherosclerotic heart disease of native coronary artery without angina pectoris: Secondary | ICD-10-CM | POA: Diagnosis present

## 2020-09-12 DIAGNOSIS — I959 Hypotension, unspecified: Secondary | ICD-10-CM | POA: Diagnosis not present

## 2020-09-12 DIAGNOSIS — Z955 Presence of coronary angioplasty implant and graft: Secondary | ICD-10-CM

## 2020-09-12 DIAGNOSIS — A419 Sepsis, unspecified organism: Secondary | ICD-10-CM | POA: Diagnosis not present

## 2020-09-12 DIAGNOSIS — H9190 Unspecified hearing loss, unspecified ear: Secondary | ICD-10-CM | POA: Diagnosis present

## 2020-09-12 HISTORY — DX: Heart failure, unspecified: I50.9

## 2020-09-12 LAB — CBC WITH DIFFERENTIAL/PLATELET
Abs Immature Granulocytes: 0.2 10*3/uL — ABNORMAL HIGH (ref 0.00–0.07)
Basophils Absolute: 0 10*3/uL (ref 0.0–0.1)
Basophils Relative: 0 %
Eosinophils Absolute: 0.1 10*3/uL (ref 0.0–0.5)
Eosinophils Relative: 0 %
HCT: 22.3 % — ABNORMAL LOW (ref 39.0–52.0)
Hemoglobin: 7.1 g/dL — ABNORMAL LOW (ref 13.0–17.0)
Immature Granulocytes: 1 %
Lymphocytes Relative: 3 %
Lymphs Abs: 0.5 10*3/uL — ABNORMAL LOW (ref 0.7–4.0)
MCH: 30.5 pg (ref 26.0–34.0)
MCHC: 31.8 g/dL (ref 30.0–36.0)
MCV: 95.7 fL (ref 80.0–100.0)
Monocytes Absolute: 0.8 10*3/uL (ref 0.1–1.0)
Monocytes Relative: 4 %
Neutro Abs: 19 10*3/uL — ABNORMAL HIGH (ref 1.7–7.7)
Neutrophils Relative %: 92 %
Platelets: 263 10*3/uL (ref 150–400)
RBC: 2.33 MIL/uL — ABNORMAL LOW (ref 4.22–5.81)
RDW: 18.8 % — ABNORMAL HIGH (ref 11.5–15.5)
WBC: 20.7 10*3/uL — ABNORMAL HIGH (ref 4.0–10.5)
nRBC: 0.1 % (ref 0.0–0.2)

## 2020-09-12 LAB — CBG MONITORING, ED: Glucose-Capillary: 81 mg/dL (ref 70–99)

## 2020-09-12 LAB — COMPREHENSIVE METABOLIC PANEL
ALT: 17 U/L (ref 0–44)
AST: 21 U/L (ref 15–41)
Albumin: 2 g/dL — ABNORMAL LOW (ref 3.5–5.0)
Alkaline Phosphatase: 64 U/L (ref 38–126)
Anion gap: 17 — ABNORMAL HIGH (ref 5–15)
BUN: 148 mg/dL — ABNORMAL HIGH (ref 8–23)
CO2: 19 mmol/L — ABNORMAL LOW (ref 22–32)
Calcium: 7.8 mg/dL — ABNORMAL LOW (ref 8.9–10.3)
Chloride: 92 mmol/L — ABNORMAL LOW (ref 98–111)
Creatinine, Ser: 4.48 mg/dL — ABNORMAL HIGH (ref 0.61–1.24)
GFR calc Af Amer: 14 mL/min — ABNORMAL LOW (ref >60)
GFR calc non Af Amer: 12 mL/min — ABNORMAL LOW (ref >60)
Glucose, Bld: 84 mg/dL (ref 70–99)
Potassium: 3.8 mmol/L (ref 3.5–5.1)
Sodium: 128 mmol/L — ABNORMAL LOW (ref 135–145)
Total Bilirubin: 1.2 mg/dL (ref 0.3–1.2)
Total Protein: 6.1 g/dL — ABNORMAL LOW (ref 6.5–8.1)

## 2020-09-12 LAB — TYPE AND SCREEN
ABO/RH(D): O POS
Antibody Screen: NEGATIVE

## 2020-09-12 LAB — RESPIRATORY PANEL BY RT PCR (FLU A&B, COVID)
Influenza A by PCR: NEGATIVE
Influenza B by PCR: NEGATIVE
SARS Coronavirus 2 by RT PCR: NEGATIVE

## 2020-09-12 LAB — LACTIC ACID, PLASMA: Lactic Acid, Venous: 1.7 mmol/L (ref 0.5–1.9)

## 2020-09-12 LAB — POC OCCULT BLOOD, ED: Fecal Occult Bld: POSITIVE — AB

## 2020-09-12 MED ORDER — SODIUM CHLORIDE 0.9 % IV SOLN
500.0000 mg | Freq: Once | INTRAVENOUS | Status: AC
  Administered 2020-09-12: 500 mg via INTRAVENOUS
  Filled 2020-09-12: qty 500

## 2020-09-12 MED ORDER — GLYCOPYRROLATE 1 MG PO TABS
1.0000 mg | ORAL_TABLET | ORAL | Status: DC | PRN
  Filled 2020-09-12: qty 1

## 2020-09-12 MED ORDER — HALOPERIDOL 0.5 MG PO TABS
0.5000 mg | ORAL_TABLET | ORAL | Status: DC | PRN
  Filled 2020-09-12: qty 1

## 2020-09-12 MED ORDER — HALOPERIDOL LACTATE 2 MG/ML PO CONC
0.5000 mg | ORAL | Status: DC | PRN
  Filled 2020-09-12: qty 0.3

## 2020-09-12 MED ORDER — POLYVINYL ALCOHOL 1.4 % OP SOLN
1.0000 [drp] | Freq: Four times a day (QID) | OPHTHALMIC | Status: DC | PRN
  Filled 2020-09-12: qty 15

## 2020-09-12 MED ORDER — LORAZEPAM 1 MG PO TABS: 1.0000 mg | ORAL_TABLET | ORAL | Status: DC | PRN

## 2020-09-12 MED ORDER — LORAZEPAM 2 MG/ML PO CONC: 1.0000 mg | ORAL | Status: DC | PRN

## 2020-09-12 MED ORDER — GLYCOPYRROLATE 0.2 MG/ML IJ SOLN: 0.2000 mg | INTRAMUSCULAR | Status: DC | PRN

## 2020-09-12 MED ORDER — BIOTENE DRY MOUTH MT LIQD
15.0000 mL | Freq: Two times a day (BID) | OROMUCOSAL | Status: DC
  Administered 2020-09-12: 15 mL via TOPICAL

## 2020-09-12 MED ORDER — LORAZEPAM 2 MG/ML IJ SOLN: 1.0000 mg | INTRAMUSCULAR | Status: DC | PRN

## 2020-09-12 MED ORDER — ACETAMINOPHEN 325 MG PO TABS: 650.0000 mg | ORAL_TABLET | Freq: Four times a day (QID) | ORAL | Status: DC | PRN

## 2020-09-12 MED ORDER — ALBUTEROL SULFATE (2.5 MG/3ML) 0.083% IN NEBU: 2.5000 mg | INHALATION_SOLUTION | RESPIRATORY_TRACT | Status: DC | PRN

## 2020-09-12 MED ORDER — ACETAMINOPHEN 650 MG RE SUPP: 650.0000 mg | Freq: Four times a day (QID) | RECTAL | Status: DC | PRN

## 2020-09-12 MED ORDER — MORPHINE 100MG IN NS 100ML (1MG/ML) PREMIX INFUSION
5.0000 mg/h | INTRAVENOUS | Status: DC
  Administered 2020-09-12 – 2020-09-13 (×2): 5 mg/h via INTRAVENOUS
  Filled 2020-09-12 (×2): qty 100

## 2020-09-12 MED ORDER — LACTATED RINGERS IV BOLUS (SEPSIS)
1000.0000 mL | Freq: Once | INTRAVENOUS | Status: AC
  Administered 2020-09-12: 1000 mL via INTRAVENOUS

## 2020-09-12 MED ORDER — DIPHENHYDRAMINE HCL 50 MG/ML IJ SOLN: 12.5000 mg | INTRAMUSCULAR | Status: DC | PRN

## 2020-09-12 MED ORDER — ONDANSETRON 4 MG PO TBDP: 4.0000 mg | ORAL_TABLET | Freq: Four times a day (QID) | ORAL | Status: DC | PRN

## 2020-09-12 MED ORDER — MORPHINE BOLUS VIA INFUSION
4.0000 mg | INTRAVENOUS | Status: DC | PRN
  Administered 2020-09-12 (×2): 4 mg via INTRAVENOUS
  Filled 2020-09-12: qty 4

## 2020-09-12 MED ORDER — SODIUM CHLORIDE 0.9 % IV SOLN
1.0000 g | Freq: Once | INTRAVENOUS | Status: AC
  Administered 2020-09-12: 1 g via INTRAVENOUS
  Filled 2020-09-12: qty 10

## 2020-09-12 MED ORDER — BIOTENE DRY MOUTH MT LIQD: 15.0000 mL | OROMUCOSAL | Status: DC | PRN

## 2020-09-12 MED ORDER — ONDANSETRON HCL 4 MG/2ML IJ SOLN: 4.0000 mg | Freq: Four times a day (QID) | INTRAMUSCULAR | Status: DC | PRN

## 2020-09-12 MED ORDER — GLYCOPYRROLATE 0.2 MG/ML IJ SOLN
0.2000 mg | INTRAMUSCULAR | Status: DC | PRN
  Administered 2020-09-12 – 2020-09-13 (×3): 0.2 mg via INTRAVENOUS
  Filled 2020-09-12 (×3): qty 1

## 2020-09-12 MED ORDER — HALOPERIDOL LACTATE 5 MG/ML IJ SOLN: 0.5000 mg | INTRAMUSCULAR | Status: DC | PRN

## 2020-09-12 MED ORDER — FLUTICASONE FUROATE-VILANTEROL 200-25 MCG/INH IN AEPB
1.0000 | INHALATION_SPRAY | Freq: Every day | RESPIRATORY_TRACT | Status: DC
  Filled 2020-09-12: qty 28

## 2020-09-12 NOTE — ED Notes (Signed)
Sacral dressing applied  °

## 2020-09-12 NOTE — Progress Notes (Signed)
Bladder scan performed and noted 357ml urine.  Peri hygiene provided and RN inserted #14 Fr indwelling cath until urine return.  Patient slept throughout entire procedure.  Will continue to monitor.

## 2020-09-12 NOTE — H&P (Signed)
History and Physical    Zephaniah Lubrano YIR:[id number] DOB: 1946-10-31 DOA: 09/04/2020  PCP: [name], MD Consultants:  None Patient coming from:  Home - lives alone, has 2 next door neighbors and his daughter lives nearby; [facility]: Daughter, Lala Lund, [phone]  Chief Complaint: Failure to thrive  HPI: Ad Guttman is a 74 y.o. male with medical history significant of HTN; HLD; COPD on home 3.5-4L O2; CAD s/p stent; PAD; and chronic combined CHF presenting with failure to thrive.  He is not understanding my questions much.  He reports that he "finally got tired of this shit" and was "hurting so bad I couldn't stand it."  He reports abdominal pain for "a pretty good while."  Appears to deny NSAIDs.  He can't see his stool.  It appears that he is bedbound and he is changed by his neighbors and his daughter.  He reports that he has not left his house in about 3 years.  I spoke with his daughter, Vanita Ingles.  He lives alone for 20+ years.  He called her months ago and said he was sick and wanted her to check on him.  She lives 40 minutes away and would stay off and on to care for him.  She reports that he has had stomach issues for several years.  The last time he was in the hospital it was for his stomach but he had a heart attack and they never got back to evaluating his stomach.  He reports that he has to make himself eat, and he often vomits.  He has been going downhill and won't let family call in support.  He isn't strong enough to get to the bathroom but he does have a potty chair.  Last she heard was maybe a month ago and neighbors have been helping him.  Now he has been sitting in his recliner and won't get up at all.  He is so "cantankerous" and he wants her live with him but she can't do that.  He takes only Tylenol for pain and he rubs Biofreeze on himself.  He has no one to take care of him at home, she had to force his hand to get him in to the hospital.  His brother died of stomach cancer.   He has to have his Advair, needs home medication.  If he doesn't have this medication, "he'll have a heart attack on you."  He cannot care for himself and she agrees that he needs placement.  She thinks maybe mild dementia.  His "internal thermostat" is off.    ED Course:   Poor historian - ?mild dementia.  Has 2 daughters, not answering phone.  Lines alone and family members help.  Here with FTT, sacral wound.  Also with melena and heme positive with Hgb 7.1.  BUN 150, creatinine 4.4.  Looks extremely dry.  BPs low on admission, given IVF with improvement.  WBC 20, normal lactate, abnormal CXR, CT without contrast ordered.  GI to see.  Patient is DNR.   Review of Systems: As per HPI; otherwise review of systems reviewed and negative.   Ambulatory Status:  Non-ambulatory  COVID Vaccine Status:  None - willing to have while inpatient  Past Medical History:  Diagnosis Date  . ALLERGIC RHINITIS 12/24/2009  . CAD 10/01/2009   Stent at [facility] greater than 10 years ago  . CHF (congestive heart failure) ([place])   . COPD 10/01/2009  . HYPERLIPIDEMIA 10/01/2009  . HYPERTENSION 10/01/2009  .  On home oxygen therapy    "3.5 - 4L; 24/7" (08/29/2017)  . 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 PVD 10/01/2009    Past Surgical History:  Procedure Laterality Date  . CARDIAC CATHETERIZATION  08/16/2014   Procedure: IABP INSERTION;  Surgeon: Leonie Man, MD;  Location: [facility] CATH LAB;  Service: Cardiovascular;;  . FRACTURE SURGERY  2012   ORIF r tibia fracture  . LEFT HEART CATH Bilateral 08/15/2014   Procedure: LEFT HEART CATH;  Surgeon: Leonie Man, MD;  Location: [facility] CATH LAB;  Service: Cardiovascular;  Laterality: Bilateral;  . PERCUTANEOUS CORONARY STENT INTERVENTION (PCI-S) N/A 08/16/2014   Procedure: PERCUTANEOUS CORONARY STENT INTERVENTION (PCI-S);  Surgeon: Leonie Man, MD;  Location: [facility] CATH LAB;  Service: Cardiovascular;  Laterality: N/A;  . TONSILLECTOMY AND  ADENOIDECTOMY      Social History   Socioeconomic History  . Marital status: Divorced    Spouse name: Not on file  . Number of children: 2  . Years of education: Not on file  . Highest education level: Not on file  Occupational History  . Occupation: Retired    Fish farm manager: DISABLED  Tobacco Use  . Smoking status: Former Smoker    Packs/day: 1.50    Years: 50.00    Pack years: 75.00    Types: Cigarettes    Quit date: 2017    Years since quitting: 4.7  . Smokeless tobacco: Never Used  Substance and Sexual Activity  . Alcohol use: Not Currently    Alcohol/week: 0.0 standard drinks    Comment: quit drinking about 4 years ago  . Drug use: No  . Sexual activity: Not on file  Other Topics Concern  . Not on file  Social History Narrative   Lives alone.     Social Determinants of Health   Financial Resource Strain:   . Difficulty of Paying Living Expenses: Not on file  Food Insecurity:   . Worried About Charity fundraiser in the Last Year: Not on file  . Ran Out of Food in the Last Year: Not on file  Transportation Needs:   . Lack of Transportation (Medical): Not on file  . Lack of Transportation (Non-Medical): Not on file  Physical Activity:   . Days of Exercise per Week: Not on file  . Minutes of Exercise per Session: Not on file  Stress:   . Feeling of Stress : Not on file  Social Connections:   . Frequency of Communication with Friends and Family: Not on file  . Frequency of Social Gatherings with Friends and Family: Not on file  . Attends Religious Services: Not on file  . Active Member of Clubs or Organizations: Not on file  . Attends Archivist Meetings: Not on file  . Marital Status: Not on file  Intimate Partner Violence:   . Fear of Current or Ex-Partner: Not on file  . Emotionally Abused: Not on file  . Physically Abused: Not on file  . Sexually Abused: Not on file    No Known Allergies  Family History  Problem Relation Age of Onset  . CAD  Father        Died age 43 MI    Prior to Admission medications   Medication Sig Start Date End Date Taking? Authorizing Provider  albuterol (VENTOLIN HFA) 108 (90 Base) MCG/ACT inhaler Inhale 2 puffs into the lungs every 6 (six) hours as needed. Patient taking differently: Inhale 2 puffs into the lungs every 6 (  six) hours as needed for wheezing or shortness of breath.  07/19/17   Burchette, Alinda Sierras, MD  amiodarone (PACERONE) 200 MG tablet TAKE 1/2 TABLET EVERY DAY 05/16/20   Burchette, Alinda Sierras, MD  aspirin 81 MG tablet Take 81 mg by mouth daily as needed (for chest pain).     [provider]  atorvastatin (LIPITOR) 40 MG tablet Take 1 tablet (40 mg total) by mouth daily at 6 PM. 05/16/20   Burchette, Alinda Sierras, MD  carvedilol (COREG) 3.125 MG tablet Take 1 tablet (3.125 mg total) by mouth 2 (two) times daily. 05/16/20   Burchette, Alinda Sierras, MD  clopidogrel (PLAVIX) 75 MG tablet Take 1 tablet (75 mg total) by mouth daily. 05/16/20   Burchette, Alinda Sierras, MD  feeding supplement, ENSURE ENLIVE, (ENSURE ENLIVE) LIQD Take 237 mLs by mouth 2 (two) times daily between meals. 09/03/17   Raiford Noble Latif, DO  fish oil-omega-3 fatty acids 1000 MG capsule Take 1 g by mouth daily.     [provider]  Fluticasone-Salmeterol (WIXELA INHUB) 500-50 MCG/DOSE AEPB Inhale 1 puff into the lungs 2 (two) times daily. 05/16/20   Burchette, Alinda Sierras, MD  furosemide (LASIX) 40 MG tablet TAKE 1 TABLET EVERY DAY AS NEEDED 05/16/20   Burchette, Alinda Sierras, MD  guaiFENesin (MUCINEX) 600 MG 12 hr tablet Take 2 tablets (1,200 mg total) by mouth 2 (two) times daily. 09/03/17   Raiford Noble Latif, DO  hydrocortisone (ANUSOL-HC) 2.5 % rectal cream apply rectally twice a day if needed for HEMORRHOIDS or itching 12/07/16   Burchette, Alinda Sierras, MD  levothyroxine (SYNTHROID) 25 MCG tablet Take 1 tablet (25 mcg total) by mouth daily before breakfast. 05/16/20   Burchette, Alinda Sierras, MD  losartan (COZAAR) 50 MG tablet TAKE 1 TABLET  EVERY DAY 08/15/20   Burchette, Alinda Sierras, MD  Miconazole Nitrate 2 % AERP Apply 1 application topically daily as needed. 07/19/18   Burchette, Alinda Sierras, MD  ranolazine (RANEXA) 500 MG 12 hr tablet Take 1 tablet (500 mg total) by mouth 2 (two) times daily. 05/16/20   Burchette, Alinda Sierras, MD  senna-docusate (SENOKOT S) 8.6-50 MG tablet Take 1 tablet by mouth as directed. Take 1 to 2 tablets by mouth daily 07/25/20   Burchette, Alinda Sierras, MD  tamsulosin (FLOMAX) 0.4 MG CAPS capsule Take 1 capsule (0.4 mg total) by mouth daily. 05/16/20   [name], MD    Physical Exam: Vitals:   09/09/2020 0925 09/18/2020 1024 08/24/2020 1027 09/01/2020 1030  BP:  93/83  101/60  Pulse:  74 75 75  Resp:  13 17 13   Temp:      TempSrc:      SpO2: 100% 100% 100% 100%  Weight:      Height:         . General:  Appears pale, frail, cachectic, and chronically ill . Eyes:  normal lids, cloudy corneas, appears to be visually impaired . ENT:  Hard of hearing, dry lips & tongue, dry mm; edentulous . Neck:  no LAD, masses or thyromegaly . Cardiovascular:  RRR, no m/r/g. No LE edema.  Marland Kitchen Respiratory:   CTA bilaterally with no wheezes/rales/rhonchi.  Normal respiratory effort. . Abdomen:  soft, very TTP in midepigastric region without apparent peritoneal signs, mildly distended . Skin:  Stage 2(?) sacral ulcer appreciated by EDP; anterior RLE excoriation and discoloration without obvious surrounding erythema         . Musculoskeletal: decreased tone BUE/BLE, no bony abnormality .  Psychiatric:  flat mood and affect, speech mostly appropriate, AOx2 . Neurologic:  Difficult to effectively perform    Radiological Exams on Admission: CT CHEST WO CONTRAST  Addendum Date: 09/11/2020   ADDENDUM REPORT: 08/27/2020 10:46 ADDENDUM: Critical Value/emergent results were called by telephone at the time of interpretation on 09/11/2020 at at 1042 hours to Dr. Karmen Bongo who verbally acknowledged these results. Electronically  Signed   By: Genevie Ann M.D.   On: 08/29/2020 10:46   Result Date: 09/14/2020 CLINICAL DATA:  74 year old male with sepsis. Renal insufficiency, not on dialysis. EXAM: CT CHEST WITHOUT CONTRAST TECHNIQUE: Multidetector CT imaging of the chest was performed following the standard protocol without IV contrast. COMPARISON:  Portable chest this morning, 09/01/2017 and earlier. CT Abdomen and Pelvis 08/13/2014. FINDINGS: Cardiovascular: Calcified aortic atherosclerosis. Calcified coronary artery atherosclerosis and/or stents. No cardiomegaly or pericardial effusion. Vascular patency is not evaluated in the absence of IV contrast. Mediastinum/Nodes: Small calcified right hilar lymph nodes. No mediastinal lymphadenopathy. Lungs/Pleura: Thick-walled, elongated and mildly lobulated cavitary mass adjacent to the right hilum, the most confluent portion of which is about 6.5 cm, is new since 2018. There is multifocal surrounding patchy and irregular additional right upper lobe opacity. There is a smaller 11 mm thin walled cavity in the right upper lobe along the minor fissure, and multiple additional right middle lobe and lower lobe small lung nodules, 1 of which demonstrates early cavitation on series 4, image 99. Multiple left lung nodules also, 2 with minimal cavitation. Additional patchy and spiculated larger peribronchial opacities in the lingula (series 4, image 105, and left lung apex (image 43) also partially cavitary. Retained secretions in the bilateral mainstem bronchi. Lung base bronchiectasis. Centrilobular emphysema. No pneumothorax or pleural effusion. Upper Abdomen: Chronic splenic calcified granulomas. Scattered small foci of free air in the upper abdomen, including along the liver contour falciform ligament, near the gallbladder fossa. Partially visible gallbladder distension and cholelithiasis. No convincing pericholecystic inflammation on these images. No dilated bowel in the upper abdomen. Musculoskeletal:  Osteopenia. Occasional chronic rib fractures. No acute or suspicious osseous lesion identified. IMPRESSION: 1. Scattered pneumoperitoneum in the upper abdomen compatible with ruptured bowel. Recommend Surgery consultation and follow-up CT Abdomen and Pelvis. 2. A 6.5 cm confluent cavitary lesion in the right upper lobe inseparable from the hilum is indeterminate for cavitary tumor (such as squamous cell carcinoma) versus necrotizing pneumonia. But there are definite infectious appearing changes surrounding the lesion and in the posterior right upper lobe. Furthermore, there are multiple additional partially cavitary bilateral lung nodules suggesting Septic Emboli. No pleural effusion.  No mediastinal lymphadenopathy. Underlying Bronchiectasis and Emphysema (ICD10-J43.9). 3. Calcified coronary artery and Aortic Atherosclerosis (ICD10-I70.0). Dr. Karmen Bongo has been paged regarding the critical findings on this exam as of 1031 hours. Electronically Signed: By: Genevie Ann M.D. On: 09/11/2020 10:35   DG Chest Port 1 View  Result Date: 09/12/2020 CLINICAL DATA:  Sepsis, wheezing. EXAM: PORTABLE CHEST 1 VIEW COMPARISON:  September 01, 2017. FINDINGS: The heart size and mediastinal contours are within normal limits. No pneumothorax or pleural effusion is noted. Left lung is clear. New irregular density is noted in the right upper lobe which may represent pneumonia, but malignancy cannot be excluded. The visualized skeletal structures are unremarkable. IMPRESSION: New irregular density seen in right upper lobe which may represent pneumonia, but malignancy cannot be excluded. CT scan of the chest is recommended for further evaluation. Electronically Signed   By: Bobbe Medico.D.  On: 09/05/2020 08:53    EKG: Independently reviewed.  NSR with rate 83; nonspecific ST changes with no evidence of acute ischemia   Labs on Admission: I have personally reviewed the available labs and imaging studies at the time of  the admission.  Pertinent labs:   Na++ 128 CO2 19 - chronically elevated at baseline BUN 148/Creatinine 4.48/GFR 12; 19/1.4/50 in 08/2019 Calcium 7.8 Anion gap 17 Albumin 2.0 Lactate 1.7 WBC 20.7 Hgb 7.1; 11.2 in 07/2019 Heme POSITIVE Blood culture pending   Assessment/Plan Principal Problem:   Admission for end of life care Active Problems:   Essential hypertension   COPD, severe: On chronic home O2   GI bleed   AKI (acute kidney injury) ([place])   Chronic systolic heart failure (HCC)   CKD (chronic kidney disease), stage III   Severe protein-calorie malnutrition (HCC)   Failure to thrive in adult   Dyslipidemia    -Patient with baseline severe debility (on 4L home O2 for severe COPD and decreasing functional status such that he no longer is able to get out of his recliner and yet living independently) -Presenting with abdominal pain of uncertain duration -Found to have GI bleed of uncertain etiology, Hgb 7.2 -GI was consulted but this has since been canceled (see below) -Also found to have acute renal failure with metabolic acidosis -CT showed multiple critical findings including pneumoperitoneum c/w ruptured GI pathology; and large 6.5 cavitary lesion either c/w tumor and/or necrotizing PNA and also with multiple septic emboli -The patient has underlying severe malnutrition (insufficient energy intake, weight loss, loss of muscle mass, loss of subcutaneous fat,diminished functional status) and is very unlikely to have adequate recovery from surgery or ability to heal - if he were to even survive it  -He also has advanced COPD with 4L home O2 requirement and this further makes him tenuous for surgery -Additionally, he has been incredibly resistant to accepting home assistance or going to a facility and yet has not been thriving at home -Surgery was consulted but given above I had a second conversation with his daughter and surgery consult was subsequently canceled -I called his  daughter back after the CT results were available and we discussed his multisystem organ failure in conjunction with his overall very poor surgical condition and his probable poor outcomes -She believes that he would not want an aggressive treatment regimen; would not want surgery; and would not be happy with placement (although clearly he is unsafe to return home) -As such, she prefers that we proceed with comfort care measures only -GI and surgical consults were canceled -Patient will be admitted for end of life care -He may be a candidate for home hospice vs. Residential hospice, although in-hospital demise also appears to be possible -Comfort care order set utilized -No antibiotics or IVF as per family's request -Pain control with morphine drip  -Palliative care consult for further discussion with patient - he has apparent mild baseline dementia as well as chronic visual and hearing impairments which complicate the conversation with him      Note: This patient has been tested and is pending for the novel coronavirus COVID-19.    DVT prophylaxis: None - comfort measures Code Status: DNR - confirmed with family Family Communication: I spoke with his daughter by telephone twice at the time of admission Disposition Plan: In-hospital death vs. Residential or home hospice Consults called: GI and surgery - both canceled; palliative care Admission status: Admit - It is my clinical opinion that admission  to INPATIENT is reasonable and necessary because of the expectation that this patient will require hospital care that crosses at least 2 midnights to treat this condition based on the medical complexity of the problems presented.  Given the aforementioned information, the predictability of an adverse outcome is felt to be significant.    Karmen Bongo MD [facility]   How to contact the [facility] Attending or Consulting provider [place] or covering provider during after hours [place], for  this patient?  1. Check the care team in [facility] and look for a) attending/consulting [facility] provider listed and b) the [facility] team listed 2. Log into [email or web address] and use [place]'s universal password to access. If you do not have the password, please contact the hospital operator. 3. Locate the [facility] provider you are looking for under [facility] and page to a number that you can be directly reached. 4. If you still have difficulty reaching the provider, please page the [facility] (Director on Call) for the Hospitalists listed on amion for assistance.   08/29/2020, 11:16 AM

## 2020-09-12 NOTE — ED Triage Notes (Signed)
Pt from home via ems; called out for sepsis and failure to thrive; per family, they have trying to keep pt clean, pt keeps declining care; and and o x 3 w/ ems, disoriented to time; sacral ulcer present; HR 98, pt on beta blocker; last bp 100 palp; pt on 5L O2 at baseline; hx CHF, COPD; cough present, lung sounds diminished in lower fields; pt has not had covid vaccine  100 palp HR 90-106 sinus, 12 lead unremarkable RR 10-22 sats 95% 5L CBG 98 97.60F

## 2020-09-12 NOTE — Consult Note (Signed)
Consultation Note Date: 08/23/2020   Patient Name: Douglas Mann  DOB: 05/30/1946  MRN: [id number]  Age / Sex: 74 y.o., male  PCP: Douglas Post, MD Referring Physician: Karmen Bongo, MD  Reason for Consultation: Disposition, Establishing goals of care, Non pain symptom management, Pain control, Psychosocial/spiritual support and Terminal Care  HPI/Patient Profile: 73 y.o. male  with past medical history of hypertension, hyperlipidemia, COPD on chronic 3-4L oxygen, CAD s/p stent, PAD, CHF seen in ED on 09/16/2020 for concern of sepsis and failure to thrive. In the ED there was concern for GI bleed and he was found to have a suspected ruptured bowel - at that time daughter decided to move forward with full comfort care.   Family faces treatment option decisions and anticipatory care needs.   Clinical Assessment and Goals of Care: I have reviewed medical records including EPIC notes, labs, and imaging. Received report from primary RN - no acute concerns.   Went to visit patient at bedside -  Daughter/Douglas present. Patient was lying in bed - he did not wake to voice or gentle touch, he is lethargic and not able to participate in conversation. No signs or non-verbal gestures of pain or discomfort noted. No respiratory distress, increased work of breathing, or secretions noted.  Met with Douglas at bedside to discuss diagnosis, prognosis, GOC, EOL wishes, disposition, and options.  I introduced Palliative Medicine as specialized medical care for people living with serious illness. It focuses on providing relief from the symptoms and stress of a serious illness. The goal is to improve quality of life for both the patient and the family.  We discussed a brief life review of the patient as well as functional and nutritional status. [name] wife passed away many years ago. The patient had one daughter/Douglas and one  step-daughter that lives in [place]. Prior to hospitalization, the patient lived alone at home - Douglas Mann and his neighbor would check on him often to help him with any needs. Douglas Mann states she has noticed a gradual decline in his health over the last year, but it became significantly worse about 2-3 months ago. At that time, the patient started to complain of not feeling well, he became nauseated often, had a decrease in his oral intake, became increasingly weak and started requiring the use of a walker to ambulate. Douglas Mann explains that she strongly encouraged the patient to seek medical attention/visit his PCP, but the patient refused to go.   We discussed patient's current illness and what it means in the larger context of patient's on-going co-morbidities. Natural disease trajectory and expectations at EOL were discussed. I attempted to elicit values and goals of care important to the patient. Douglas Mann was comfortable in her decision of making the patient comfort care as she stated, "he's been sick for a long time and I no longer want him to suffer." Emotional support was provided. Therapeutic listening was provided as Douglas Mann reflected on the patient's life.   Transfer to residential hospice vs remaining in house  with comfort care was discussed - explained that at this time I do not feel the patient is stable for transfer to hospice. Douglas Mann was in agreement. We talked about what to expect with comfort measures in house and what that would entail inclusive of medications to control pain, dyspnea, agitation, nausea, itching, and hiccups. We discussed stopping all uneccessary measures such as blood draws, needle sticks, oxygen, CBGs/insulin, cardiac monitoring, and frequent vital signs. Douglas Mann was in agreement with stopping items listed except for oxygen - because the patient wears oxygen chronically, she wanted it to remain on today and rediscuss removing it tomorrow. She was ok, however, with not escalating.    Obtained copies of patient's Living Will and HCPOA.  Discussed with Douglas Mann the importance of continued conversation with family and the medical providers regarding overall plan of care and treatment options, ensuring decisions are within the context of the patient's values and GOCs.    Questions and concerns were addressed. The family was encouraged to call with questions or concerns. PMT card was provided.   Primary Decision Maker: HCPOA - daughter/Douglas Mann    SUMMARY OF RECOMMENDATIONS  Continue full comfort care   Continue DNR/DNI as previously documented  Patient is not stable for discharge to hospice - anticipate hospital death, likely hours to days  Continue comfort orders as placed by admitting provider  Added orders to ensure EOL comfort and to reflect full comfort measures, as well as discontinued orders that were not focused on comfort  Unrestricted visitation orders were placed per current [facility] EOL visitation policy   Provide frequent assessments and administer PRN medications as clinically necessary to ensure EOL comfort  Copies of Living Will and HCPOA documents were obtained - will be scanned into [employer]  HCPOA is daughter/Douglas Mann  PMT will continue to follow holistically  Code Status/Advance Care Planning:  DNR  Palliative Prophylaxis:   Aspiration, Bowel Regimen, Delirium Protocol, Eye Care, Frequent Pain Assessment, Oral Care and Turn Reposition  Additional Recommendations (Limitations, Scope, Preferences):  Full Comfort Care  Psycho-social/Spiritual:   Desire for further Chaplaincy support:no  Created space and opportunity for family to express thoughts and feelings regarding patient's current medical situation.   Emotional support provided.  Prognosis:   Hours - Days  Discharge Planning: Anticipated Hospital Death      Primary Diagnoses: Present on Admission: . Failure to thrive in adult . AKI (acute kidney  injury) ([place]) . Chronic systolic heart failure ([place]) . CKD (chronic kidney disease), stage III . (Resolved) COPD (chronic obstructive pulmonary disease) ([place]) . 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 Dyslipidemia . Essential hypertension . GI bleed . Severe protein-calorie malnutrition ([place])   I have reviewed the medical record, interviewed the patient and family, and examined the patient. The following aspects are pertinent.  Past Medical History:  Diagnosis Date  . ALLERGIC RHINITIS 12/24/2009  . CAD 10/01/2009   Stent at [facility] greater than 10 years ago  . CHF (congestive heart failure) ([place])   . COPD 10/01/2009  . HYPERLIPIDEMIA 10/01/2009  . HYPERTENSION 10/01/2009  . On home oxygen therapy    "3.5 - 4L; 24/7" (08/29/2017)  . 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 PVD 10/01/2009   Social History   Socioeconomic History  . Marital status: Divorced    Spouse name: Not on file  . Number of children: 2  . Years of education: Not on file  . Highest education level: Not on file  Occupational History  .  Occupation: Retired    Fish farm manager: DISABLED  Tobacco Use  . Smoking status: Former Smoker    Packs/day: 1.50    Years: 50.00    Pack years: 75.00    Types: Cigarettes    Quit date: 2017    Years since quitting: 4.7  . Smokeless tobacco: Never Used  Substance and Sexual Activity  . Alcohol use: Not Currently    Alcohol/week: 0.0 standard drinks    Comment: quit drinking about 4 years ago  . Drug use: No  . Sexual activity: Not on file  Other Topics Concern  . Not on file  Social History Narrative   Lives alone.     Social Determinants of Health   Financial Resource Strain:   . Difficulty of Paying Living Expenses: Not on file  Food Insecurity:   . Worried About Charity fundraiser in the Last Year: Not on file  . Ran Out of Food in the Last Year: Not on file  Transportation Needs:   . Lack of Transportation (Medical): Not on file  . Lack of  Transportation (Non-Medical): Not on file  Physical Activity:   . Days of Exercise per Week: Not on file  . Minutes of Exercise per Session: Not on file  Stress:   . Feeling of Stress : Not on file  Social Connections:   . Frequency of Communication with Friends and Family: Not on file  . Frequency of Social Gatherings with Friends and Family: Not on file  . Attends Religious Services: Not on file  . Active Member of Clubs or Organizations: Not on file  . Attends Archivist Meetings: Not on file  . Marital Status: Not on file   Family History  Problem Relation Age of Onset  . CAD Father        Died age 3 MI   Scheduled Meds: . fluticasone furoate-vilanterol  1 puff Inhalation Daily   Continuous Infusions: . morphine 5 mg/hr (09/04/2020 1509)   PRN Meds:.acetaminophen **OR** acetaminophen, albuterol, antiseptic oral rinse, diphenhydrAMINE, glycopyrrolate **OR** glycopyrrolate **OR** glycopyrrolate, haloperidol **OR** haloperidol **OR** haloperidol lactate, LORazepam **OR** LORazepam **OR** LORazepam, morphine, ondansetron **OR** ondansetron (ZOFRAN) IV, polyvinyl alcohol Medications Prior to Admission:  Prior to Admission medications   Medication Sig Start Date End Date Taking? Authorizing Provider  acetaminophen (TYLENOL) 650 MG CR tablet Take 1,300 mg by mouth every 8 (eight) hours as needed for pain.   Yes [provider]  amiodarone (PACERONE) 200 MG tablet TAKE 1/2 TABLET EVERY DAY Patient taking differently: Take 100 mg by mouth daily. TAKE 1/2 TABLET EVERY DAY 05/16/20  Yes Burchette, Alinda Sierras, MD  atorvastatin (LIPITOR) 40 MG tablet Take 1 tablet (40 mg total) by mouth daily at 6 PM. 05/16/20  Yes Burchette, Alinda Sierras, MD  carvedilol (COREG) 3.125 MG tablet Take 1 tablet (3.125 mg total) by mouth 2 (two) times daily. 05/16/20  Yes Burchette, Alinda Sierras, MD  clopidogrel (PLAVIX) 75 MG tablet Take 1 tablet (75 mg total) by mouth daily. 05/16/20  Yes Burchette, Alinda Sierras,  MD  feeding supplement, ENSURE ENLIVE, (ENSURE ENLIVE) LIQD Take 237 mLs by mouth 2 (two) times daily between meals. 09/03/17  Yes Sheikh, Omair Latif, DO  fish oil-omega-3 fatty acids 1000 MG capsule Take 1 g by mouth daily.    Yes [provider]  Fluticasone-Salmeterol (WIXELA INHUB) 500-50 MCG/DOSE AEPB Inhale 1 puff into the lungs 2 (two) times daily. 05/16/20  Yes Burchette, Alinda Sierras, MD  furosemide (LASIX)  40 MG tablet TAKE 1 TABLET EVERY DAY AS NEEDED Patient taking differently: Take 40 mg by mouth daily as needed for fluid. TAKE 1 TABLET EVERY DAY AS NEEDED 05/16/20  Yes Burchette, Alinda Sierras, MD  hydrocortisone (ANUSOL-HC) 2.5 % rectal cream apply rectally twice a day if needed for HEMORRHOIDS or itching Patient taking differently: Place 1 application rectally 2 (two) times daily as needed for hemorrhoids.  12/07/16  Yes Burchette, Alinda Sierras, MD  levothyroxine (SYNTHROID) 25 MCG tablet Take 1 tablet (25 mcg total) by mouth daily before breakfast. 05/16/20  Yes Burchette, Alinda Sierras, MD  losartan (COZAAR) 50 MG tablet TAKE 1 TABLET EVERY DAY Patient taking differently: Take 50 mg by mouth daily.  08/15/20  Yes Burchette, Alinda Sierras, MD  polyethylene glycol (MIRALAX / GLYCOLAX) 17 g packet Take 17 g by mouth daily as needed for mild constipation.   Yes [provider]  ranolazine (RANEXA) 500 MG 12 hr tablet Take 1 tablet (500 mg total) by mouth 2 (two) times daily. 05/16/20  Yes Burchette, Alinda Sierras, MD  tamsulosin (FLOMAX) 0.4 MG CAPS capsule Take 1 capsule (0.4 mg total) by mouth daily. 05/16/20  Yes Burchette, Alinda Sierras, MD   No Known Allergies Review of Systems  Unable to perform ROS: Acuity of condition    Physical Exam Vitals and nursing note reviewed.  Constitutional:      General: He is not in acute distress.    Appearance: He is ill-appearing.  Pulmonary:     Effort: No respiratory distress.  Skin:    General: Skin is cool and dry.     Coloration: Skin is pale.    Neurological:     Mental Status: He is lethargic.     Motor: Weakness present.  Psychiatric:        Cognition and Memory: Cognition is impaired. Memory is impaired.     Vital Signs: BP (!) 176/163   Pulse (!) 147   Temp (!) 97.5 F (36.4 C) (Oral)   Resp 12   Ht '5\' 7"'  (1.702 m)   Wt 70.8 kg   SpO2 100%   BMI 24.45 kg/m  Pain Scale: PAINAD   Pain Score: Asleep   SpO2: SpO2: 100 % O2 Device:SpO2: 100 % O2 Flow Rate: .O2 Flow Rate (L/min): 2 L/min  IO: Intake/output summary:   Intake/Output Summary (Last 24 hours) at 08/26/2020 1610 Last data filed at 08/21/2020 1509 Gross per 24 hour  Intake 1320.83 ml  Output --  Net 1320.83 ml    LBM: Last BM Date: 09/07/2020 Baseline Weight: Weight: 71.2 kg Most recent weight: Weight: 70.8 kg     Palliative Assessment/Data: PPS 10%     Time In: 1515 Time Out: 1625 Time Total: 70 minutes  Greater than 50%  of this time was spent counseling and coordinating care related to the above assessment and plan.  Signed by: Lin Landsman, NP   Please contact Palliative Medicine Team phone at [phone] for questions and concerns.  For individual provider: See Shea Evans

## 2020-09-12 NOTE — Consult Note (Signed)
Referring Provider: Dr. Ronnald Nian  Primary Care Physician:  [name], MD Primary Gastroenterologist:  Althia Forts   Reason for Consultation:  Anemia, melena   HPI: Douglas Mann is a 74 y.o. male with a past medical history of hypertension, coronary artery disease s/p stent 10+ years ago, COPD on 3.5 to 4 L oxygen at home, CKD  and osteoarthritis. He was transported to [facility] ED today via EMS due to failure to thrive at home.  He is been bedbound at home with the development of a sacral wound which has been followed by home health.  He was noted to have dark stool.  His medication list includes Plavix 75mg  daily and aspirin 81 mg daily. In the ED his hemoglobin was 7.1 (base line Hg 11.2 on 07/27/2019). A chest CT identified evidence of a ruptured bowel and a 6.5 cm lesion in the right upper lobe with evidence of septic emboli. No surgical intervention recommended. Patient to transition to comfort care per Dr. Karmen Bongo.  The above history was obtained from the ED physicians consult as the patient is a poor historian and there is no family at the bedside. He has 2 daughters [place] and Angie. He did not want me to call his daughter Vanita Ingles, he was adamant about that. He asked that I call his daughter Janace Hoard, however, her phone number is not available in epic and he was unable to provide me with her phone number.   ED course: Sodium 128.  Potassium 3.8.  Glucose 84.  BUN 148.  Creatinine 4.48.  Calcium 7.8.  Albumin 2.0.  AST 21.  ALT 17 total protein 6.1.  Total bili 1.2.   WBC 20.7.  Hemoglobin 7.1.  Hematocrit 22.3.  MCV 95.7.  Platelet 263.  Chest CT without contrast: 1. Scattered pneumoperitoneum in the upper abdomen compatible with ruptured bowel. Recommend Surgery consultation and follow-up CT Abdomen and Pelvis.  2. A 6.5 cm confluent cavitary lesion in the right upper lobe inseparable from the hilum is indeterminate for cavitary tumor (such as squamous cell carcinoma)  versus necrotizing pneumonia. But there are definite infectious appearing changes surrounding the lesion and in the posterior right upper lobe. Furthermore, there are multiple additional partially cavitary bilateral lung nodules suggesting Septic Emboli. No pleural effusion.  No mediastinal lymphadenopathy. Underlying Bronchiectasis and Emphysema (ICD10-J43.9).  3. Calcified coronary artery and Aortic Atherosclerosis (ICD10-I70.0).   Past Medical History:  Diagnosis Date  . ALLERGIC RHINITIS 12/24/2009  . CAD 10/01/2009   Stent at [facility] greater than 10 years ago  . CHF (congestive heart failure) ([place])   . COPD 10/01/2009  . HYPERLIPIDEMIA 10/01/2009  . HYPERTENSION 10/01/2009  . On home oxygen therapy    "3.5 - 4L; 24/7" (08/29/2017)  . 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 PVD 10/01/2009    Past Surgical History:  Procedure Laterality Date  . CARDIAC CATHETERIZATION  08/16/2014   Procedure: IABP INSERTION;  Surgeon: Leonie Man, MD;  Location: [facility] CATH LAB;  Service: Cardiovascular;;  . FRACTURE SURGERY  2012   ORIF r tibia fracture  . LEFT HEART CATH Bilateral 08/15/2014   Procedure: LEFT HEART CATH;  Surgeon: Leonie Man, MD;  Location: [facility] CATH LAB;  Service: Cardiovascular;  Laterality: Bilateral;  . PERCUTANEOUS CORONARY STENT INTERVENTION (PCI-S) N/A 08/16/2014   Procedure: PERCUTANEOUS CORONARY STENT INTERVENTION (PCI-S);  Surgeon: Leonie Man, MD;  Location: [facility] CATH LAB;  Service: Cardiovascular;  Laterality: N/A;  . TONSILLECTOMY AND ADENOIDECTOMY  Prior to Admission medications   Medication Sig Start Date End Date Taking? Authorizing Provider  albuterol (VENTOLIN HFA) 108 (90 Base) MCG/ACT inhaler Inhale 2 puffs into the lungs every 6 (six) hours as needed. Patient taking differently: Inhale 2 puffs into the lungs every 6 (six) hours as needed for wheezing or shortness of breath.  07/19/17   Burchette, Alinda Sierras, MD  amiodarone (PACERONE)  200 MG tablet TAKE 1/2 TABLET EVERY DAY 05/16/20   Burchette, Alinda Sierras, MD  aspirin 81 MG tablet Take 81 mg by mouth daily as needed (for chest pain).     [provider]  atorvastatin (LIPITOR) 40 MG tablet Take 1 tablet (40 mg total) by mouth daily at 6 PM. 05/16/20   Burchette, Alinda Sierras, MD  carvedilol (COREG) 3.125 MG tablet Take 1 tablet (3.125 mg total) by mouth 2 (two) times daily. 05/16/20   Burchette, Alinda Sierras, MD  clopidogrel (PLAVIX) 75 MG tablet Take 1 tablet (75 mg total) by mouth daily. 05/16/20   Burchette, Alinda Sierras, MD  feeding supplement, ENSURE ENLIVE, (ENSURE ENLIVE) LIQD Take 237 mLs by mouth 2 (two) times daily between meals. 09/03/17   Raiford Noble Latif, DO  fish oil-omega-3 fatty acids 1000 MG capsule Take 1 g by mouth daily.     [provider]  Fluticasone-Salmeterol (WIXELA INHUB) 500-50 MCG/DOSE AEPB Inhale 1 puff into the lungs 2 (two) times daily. 05/16/20   Burchette, Alinda Sierras, MD  furosemide (LASIX) 40 MG tablet TAKE 1 TABLET EVERY DAY AS NEEDED 05/16/20   Burchette, Alinda Sierras, MD  guaiFENesin (MUCINEX) 600 MG 12 hr tablet Take 2 tablets (1,200 mg total) by mouth 2 (two) times daily. 09/03/17   Raiford Noble Latif, DO  hydrocortisone (ANUSOL-HC) 2.5 % rectal cream apply rectally twice a day if needed for HEMORRHOIDS or itching 12/07/16   Burchette, Alinda Sierras, MD  levothyroxine (SYNTHROID) 25 MCG tablet Take 1 tablet (25 mcg total) by mouth daily before breakfast. 05/16/20   Burchette, Alinda Sierras, MD  losartan (COZAAR) 50 MG tablet TAKE 1 TABLET EVERY DAY 08/15/20   Burchette, Alinda Sierras, MD  Miconazole Nitrate 2 % AERP Apply 1 application topically daily as needed. 07/19/18   Burchette, Alinda Sierras, MD  ranolazine (RANEXA) 500 MG 12 hr tablet Take 1 tablet (500 mg total) by mouth 2 (two) times daily. 05/16/20   Burchette, Alinda Sierras, MD  senna-docusate (SENOKOT S) 8.6-50 MG tablet Take 1 tablet by mouth as directed. Take 1 to 2 tablets by mouth daily 07/25/20   Burchette, Alinda Sierras, MD    tamsulosin (FLOMAX) 0.4 MG CAPS capsule Take 1 capsule (0.4 mg total) by mouth daily. 05/16/20   Burchette, Alinda Sierras, MD    No current facility-administered medications for this encounter.   Current Outpatient Medications  Medication Sig Dispense Refill  . albuterol (VENTOLIN HFA) 108 (90 Base) MCG/ACT inhaler Inhale 2 puffs into the lungs every 6 (six) hours as needed. (Patient taking differently: Inhale 2 puffs into the lungs every 6 (six) hours as needed for wheezing or shortness of breath. ) 3 Inhaler 3  . amiodarone (PACERONE) 200 MG tablet TAKE 1/2 TABLET EVERY DAY 45 tablet 1  . aspirin 81 MG tablet Take 81 mg by mouth daily as needed (for chest pain).     Marland Kitchen atorvastatin (LIPITOR) 40 MG tablet Take 1 tablet (40 mg total) by mouth daily at 6 PM. 90 tablet 1  . carvedilol (COREG) 3.125 MG tablet Take 1 tablet (3.125  mg total) by mouth 2 (two) times daily. 180 tablet 0  . clopidogrel (PLAVIX) 75 MG tablet Take 1 tablet (75 mg total) by mouth daily. 90 tablet 1  . feeding supplement, ENSURE ENLIVE, (ENSURE ENLIVE) LIQD Take 237 mLs by mouth 2 (two) times daily between meals. 237 mL 12  . fish oil-omega-3 fatty acids 1000 MG capsule Take 1 g by mouth daily.     . Fluticasone-Salmeterol (WIXELA INHUB) 500-50 MCG/DOSE AEPB Inhale 1 puff into the lungs 2 (two) times daily. 180 each 3  . furosemide (LASIX) 40 MG tablet TAKE 1 TABLET EVERY DAY AS NEEDED 90 tablet 1  . guaiFENesin (MUCINEX) 600 MG 12 hr tablet Take 2 tablets (1,200 mg total) by mouth 2 (two) times daily. 30 tablet 0  . hydrocortisone (ANUSOL-HC) 2.5 % rectal cream apply rectally twice a day if needed for HEMORRHOIDS or itching 30 g 3  . levothyroxine (SYNTHROID) 25 MCG tablet Take 1 tablet (25 mcg total) by mouth daily before breakfast. 90 tablet 1  . losartan (COZAAR) 50 MG tablet TAKE 1 TABLET EVERY DAY 90 tablet 2  . Miconazole Nitrate 2 % AERP Apply 1 application topically daily as needed. 85 g 0  . ranolazine (RANEXA) 500 MG  12 hr tablet Take 1 tablet (500 mg total) by mouth 2 (two) times daily. 180 tablet 0  . senna-docusate (SENOKOT S) 8.6-50 MG tablet Take 1 tablet by mouth as directed. Take 1 to 2 tablets by mouth daily 60 tablet 1  . tamsulosin (FLOMAX) 0.4 MG CAPS capsule Take 1 capsule (0.4 mg total) by mouth daily. 90 capsule 1    Allergies as of 09/03/2020  . (No Known Allergies)    Family History  Problem Relation Age of Onset  . CAD Father        Died age 17 MI    Social History   Socioeconomic History  . Marital status: Divorced    Spouse name: Not on file  . Number of children: 2  . Years of education: Not on file  . Highest education level: Not on file  Occupational History  . Occupation: Retired    Fish farm manager: DISABLED  Tobacco Use  . Smoking status: Former Smoker    Packs/day: 1.50    Years: 50.00    Pack years: 75.00    Types: Cigarettes    Quit date: 2017    Years since quitting: 4.7  . Smokeless tobacco: Never Used  Substance and Sexual Activity  . Alcohol use: Not Currently    Alcohol/week: 0.0 standard drinks    Comment: quit drinking about 4 years ago  . Drug use: No  . Sexual activity: Not on file  Other Topics Concern  . Not on file  Social History Narrative   Lives alone.     Social Determinants of Health   Financial Resource Strain:   . Difficulty of Paying Living Expenses: Not on file  Food Insecurity:   . Worried About Charity fundraiser in the Last Year: Not on file  . Ran Out of Food in the Last Year: Not on file  Transportation Needs:   . Lack of Transportation (Medical): Not on file  . Lack of Transportation (Non-Medical): Not on file  Physical Activity:   . Days of Exercise per Week: Not on file  . Minutes of Exercise per Session: Not on file  Stress:   . Feeling of Stress : Not on file  Social Connections:   . Frequency  of Communication with Friends and Family: Not on file  . Frequency of Social Gatherings with Friends and Family: Not on file   . Attends Religious Services: Not on file  . Active Member of Clubs or Organizations: Not on file  . Attends Archivist Meetings: Not on file  . Marital Status: Not on file  Intimate Partner Violence:   . Fear of Current or Ex-Partner: Not on file  . Emotionally Abused: Not on file  . Physically Abused: Not on file  . Sexually Abused: Not on file    Review of Systems: Patient complains of right upper quadrant abdominal pain. Limited review of systems as the patient is a poor historian.   Physical Exam: Vital signs in last 24 hours: Temp:  [97.5 F (36.4 C)] 97.5 F (36.4 C) (09/24 0803) Pulse Rate:  [74-85] 75 (09/24 1027) Resp:  [13-18] 17 (09/24 1027) BP: (91-112)/(53-83) 93/83 (09/24 1024) SpO2:  [96 %-100 %] 100 % (09/24 1027) Weight:  [71.2 kg] 71.2 kg (09/24 0805)   General: Frail ill appearing 74 year old male. Head:  Normocephalic and atraumatic. Eyes:  No scleral icterus. Conjunctiva pink. Ears:  Normal auditory acuity. Nose:  No deformity, discharge or lesions. Mouth: Dentition. No ulcers or lesions.  Neck:  Supple. No lymphadenopathy or thyromegaly.  Lungs: Breath sounds throughout.  Heart: Regular rate and rhythm, no murmurs. Abdomen: Soft, nondistended. Right upper quadrant tenderness without rebound or guarding. Hypoactive bowel sounds x 4 quads. Rectal: Dark brown near black loose stool grossly heme +.  Musculoskeletal:  Symmetrical without gross deformities.  Pulses:  Normal pulses noted. Extremities:  Without clubbing or edema. Neurologic:  Alert to name. He knows he is in the hospital. He moves all extremities weakly. Skin:  Intact without significant lesions or rashes. Sacral wound covered with protectant dressing.  Psych:  Alert and cooperative.   Intake/Output from previous day: No intake/output data recorded. Intake/Output this shift: Total I/O In: 93.6 [IV Piggyback:93.6] Out: -   Lab Results: Recent Labs    09/06/2020 0815  WBC  20.7*  HGB 7.1*  HCT 22.3*  PLT 263   BMET Recent Labs    08/27/2020 0815  NA 128*  K 3.8  CL 92*  CO2 19*  GLUCOSE 84  BUN 148*  CREATININE 4.48*  CALCIUM 7.8*   LFT Recent Labs    08/25/2020 0815  PROT 6.1*  ALBUMIN 2.0*  AST 21  ALT 17  ALKPHOS 64  BILITOT 1.2   PT/INR No results for input(s): LABPROT, INR in the last 72 hours. Hepatitis Panel No results for input(s): HEPBSAG, HCVAB, HEPAIGM, HEPBIGM in the last 72 hours.    Studies/Results: DG Chest Port 1 View  Result Date: 08/24/2020 CLINICAL DATA:  Sepsis, wheezing. EXAM: PORTABLE CHEST 1 VIEW COMPARISON:  September 01, 2017. FINDINGS: The heart size and mediastinal contours are within normal limits. No pneumothorax or pleural effusion is noted. Left lung is clear. New irregular density is noted in the right upper lobe which may represent pneumonia, but malignancy cannot be excluded. The visualized skeletal structures are unremarkable. IMPRESSION: New irregular density seen in right upper lobe which may represent pneumonia, but malignancy cannot be excluded. CT scan of the chest is recommended for further evaluation. Electronically Signed   By: Marijo Conception M.D.   On: 09/12/2020 08:53    IMPRESSION/PLAN:  29. 74 year old male with melenic stool and anemia. Chest CT identified evidence of a bowel perforation and a right lung mass with  possible septic emboli. -Patient to transition to comfort care per Dr. Karmen Bongo -No plans for endoscopic evaluation -GI service to sign off   Noralyn Pick  09/11/2020, 10:32 AM

## 2020-09-12 NOTE — ED Provider Notes (Addendum)
[facility] Provider Note   CSN: [id number] Arrival date & time: 08/24/2020  0754     History Chief Complaint  Patient presents with  . Failure To Thrive  . Wound Infection    Griffon Herberg is a 74 y.o. male.  Level 5 caveat as patient is overall poor historian.  Awaiting for family arrival.  EMS states that patient brought here for failure to thrive.  Has small sacral wound.  Has been fairly bedbound for a long time.  Supposedly has home health aides coming out.  Sometimes wears oxygen for COPD however on room air normal oxygen.  Patient has stooled himself.  He is cachectic per EMS.  Concern for sepsis given low blood pressure with EMS.  They noted possibly some dark stool.  Unknown if patient is on blood thinner.  Patient himself denies any pain.  Tells me that he has not walked for a long time.  States that he has help at home.  Overall does not really admit to any pain or discomfort.  Has not noticed if his stool has been dark.  Does not know if he is on a blood thinner.  States that he has not eaten in a while.  The history is provided by the patient.  Illness Severity:  Moderate Onset quality:  Gradual Timing:  Constant Progression:  Worsening Associated symptoms: no abdominal pain, no chest pain, no cough, no ear pain, no fever, no rash, no shortness of breath, no sore throat and no vomiting        Past Medical History:  Diagnosis Date  . ALLERGIC RHINITIS 12/24/2009  . CAD 10/01/2009   Stent at [place] greater than 10 years ago  . CHF (congestive heart failure) ([place])   . COPD 10/01/2009  . HYPERLIPIDEMIA 10/01/2009  . HYPERTENSION 10/01/2009  . On home oxygen therapy    "3.5 - 4L; 24/7" (08/29/2017)  . 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 PVD 10/01/2009    Patient Active Problem List   Diagnosis Date Noted  . Thrombocytosis ([place]) 09/01/2017  . Severe protein-calorie malnutrition ([place]) 08/31/2017  . Respiratory  distress 08/29/2017  . Hyponatremia 08/29/2017  . Acute respiratory distress 08/29/2017  . Leukocytosis 08/29/2017  . Constipation 08/29/2017  . COPD exacerbation ([place])   . Benign prostatic hyperplasia 03/02/2016  . Hypothyroidism 10/16/2015  . Urinary hesitancy 10/14/2015  . CKD (chronic kidney disease), stage III 09/25/2014  . Chronic systolic heart failure ([place]) 09/13/2014  . Hypotension due to drugs 09/10/2014  . Normocytic anemia 08/26/2014  . AKI (acute kidney injury) ([place]) 08/19/2014  . Atherosclerotic heart disease of native coronary artery with unstable angina pectoris: Chronic percent RCA with left to right collaterals; 90% proximal LAD. 08/16/2014  . Acute combined systolic and diastolic HF (heart failure), NYHA class 4: In setting of non-STEMI; LVEDP 45 mmHg 08/16/2014    Class: Acute  . Cardiomyopathy, ischemic: Severe. EF 5 -10% by LV gram - following non-STEMI and ventricular tachycardia 08/16/2014  . MI, acute, non ST segment elevation ([place]) 08/15/2014    Class: Acute  . Acute pulmonary edema ([place]) 08/15/2014  . Acute respiratory failure with hypoxia ([place]) 08/15/2014  . Cardiogenic shock: Following nonSTEMI 08/15/2014    Class: Acute  . GI bleed 08/13/2014  . WEIGHT LOSS 12/23/2010  . COUGH, CHRONIC 01/28/2010  . ALLERGIC RHINITIS 12/24/2009  . 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 Hyperlipidemia 10/01/2009  . Essential hypertension 10/01/2009  . Coronary atherosclerosis 10/01/2009  .  Peripheral arterial occlusive disease: 100% occluded right common iliac; focal 90 and diffuse 60-70% left common and external iliac 10/01/2009    Class: Diagnosis of  . COPD, severe: On chronic home O2 10/01/2009    Class: Diagnosis of    Past Surgical History:  Procedure Laterality Date  . CARDIAC CATHETERIZATION  08/16/2014   Procedure: IABP INSERTION;  Surgeon: Leonie Man, MD;  Location: [facility] CATH LAB;  Service: Cardiovascular;;  . FRACTURE SURGERY   2012   ORIF r tibia fracture  . LEFT HEART CATH Bilateral 08/15/2014   Procedure: LEFT HEART CATH;  Surgeon: Leonie Man, MD;  Location: [facility] CATH LAB;  Service: Cardiovascular;  Laterality: Bilateral;  . PERCUTANEOUS CORONARY STENT INTERVENTION (PCI-S) N/A 08/16/2014   Procedure: PERCUTANEOUS CORONARY STENT INTERVENTION (PCI-S);  Surgeon: Leonie Man, MD;  Location: [facility] CATH LAB;  Service: Cardiovascular;  Laterality: N/A;  . TONSILLECTOMY AND ADENOIDECTOMY         Family History  Problem Relation Age of Onset  . CAD Father        Died age 43 MI    Social History   Tobacco Use  . Smoking status: Former Smoker    Packs/day: 1.50    Years: 50.00    Pack years: 75.00    Types: Cigarettes  . Smokeless tobacco: Never Used  . Tobacco comment: quit  about 2 years ago  Substance Use Topics  . Alcohol use: No    Alcohol/week: 0.0 standard drinks  . Drug use: No    Home Medications Prior to Admission medications   Medication Sig Start Date End Date Taking? Authorizing Provider  albuterol (VENTOLIN HFA) 108 (90 Base) MCG/ACT inhaler Inhale 2 puffs into the lungs every 6 (six) hours as needed. Patient taking differently: Inhale 2 puffs into the lungs every 6 (six) hours as needed for wheezing or shortness of breath.  07/19/17   Burchette, Alinda Sierras, MD  amiodarone (PACERONE) 200 MG tablet TAKE 1/2 TABLET EVERY DAY 05/16/20   Burchette, Alinda Sierras, MD  aspirin 81 MG tablet Take 81 mg by mouth daily as needed (for chest pain).     [provider]  atorvastatin (LIPITOR) 40 MG tablet Take 1 tablet (40 mg total) by mouth daily at 6 PM. 05/16/20   Burchette, Alinda Sierras, MD  carvedilol (COREG) 3.125 MG tablet Take 1 tablet (3.125 mg total) by mouth 2 (two) times daily. 05/16/20   Burchette, Alinda Sierras, MD  clopidogrel (PLAVIX) 75 MG tablet Take 1 tablet (75 mg total) by mouth daily. 05/16/20   Burchette, Alinda Sierras, MD  feeding supplement, ENSURE ENLIVE, (ENSURE ENLIVE) LIQD Take 237 mLs by mouth  2 (two) times daily between meals. 09/03/17   Raiford Noble Latif, DO  fish oil-omega-3 fatty acids 1000 MG capsule Take 1 g by mouth daily.     [provider]  Fluticasone-Salmeterol (WIXELA INHUB) 500-50 MCG/DOSE AEPB Inhale 1 puff into the lungs 2 (two) times daily. 05/16/20   Burchette, Alinda Sierras, MD  furosemide (LASIX) 40 MG tablet TAKE 1 TABLET EVERY DAY AS NEEDED 05/16/20   Burchette, Alinda Sierras, MD  guaiFENesin (MUCINEX) 600 MG 12 hr tablet Take 2 tablets (1,200 mg total) by mouth 2 (two) times daily. 09/03/17   Raiford Noble Latif, DO  hydrocortisone (ANUSOL-HC) 2.5 % rectal cream apply rectally twice a day if needed for HEMORRHOIDS or itching 12/07/16   Burchette, Alinda Sierras, MD  levothyroxine (SYNTHROID) 25 MCG tablet Take 1 tablet (25 mcg  total) by mouth daily before breakfast. 05/16/20   Burchette, Alinda Sierras, MD  losartan (COZAAR) 50 MG tablet TAKE 1 TABLET EVERY DAY 08/15/20   Burchette, Alinda Sierras, MD  Miconazole Nitrate 2 % AERP Apply 1 application topically daily as needed. 07/19/18   Burchette, Alinda Sierras, MD  ranolazine (RANEXA) 500 MG 12 hr tablet Take 1 tablet (500 mg total) by mouth 2 (two) times daily. 05/16/20   Burchette, Alinda Sierras, MD  senna-docusate (SENOKOT S) 8.6-50 MG tablet Take 1 tablet by mouth as directed. Take 1 to 2 tablets by mouth daily 07/25/20   Burchette, Alinda Sierras, MD  tamsulosin (FLOMAX) 0.4 MG CAPS capsule Take 1 capsule (0.4 mg total) by mouth daily. 05/16/20   Burchette, Alinda Sierras, MD    Allergies    Patient has no known allergies.  Review of Systems   Review of Systems  Constitutional: Negative for chills and fever.  HENT: Negative for ear pain and sore throat.   Eyes: Negative for pain and visual disturbance.  Respiratory: Negative for cough and shortness of breath.   Cardiovascular: Negative for chest pain and palpitations.  Gastrointestinal: Negative for abdominal pain and vomiting.  Genitourinary: Negative for dysuria and hematuria.  Musculoskeletal: Negative for  arthralgias and back pain.  Skin: Positive for wound. Negative for color change and rash.  Neurological: Negative for seizures and syncope.  All other systems reviewed and are negative.   Physical Exam Updated Vital Signs  ED Triage Vitals  Enc Vitals Group     BP 08/28/2020 0800 (!) 91/53     Pulse Rate 09/14/2020 0800 83     Resp 08/22/2020 0800 16     Temp 09/11/2020 0803 (!) 97.5 F (36.4 C)     Temp Source 09/11/2020 0803 Oral     SpO2 09/07/2020 0800 96 %     Weight 08/21/2020 0805 157 lb (71.2 kg)     Height 08/23/2020 0805 5\' 7"  (1.702 m)     Head Circumference --      Peak Flow --      Pain Score 09/12/20 0804 8     Pain Loc --      Pain Edu? --      Excl. in [place]? --     Physical Exam Vitals and nursing note reviewed.  Constitutional:      General: He is in acute distress.     Appearance: He is well-developed.     Comments: Cachectic  HENT:     Head: Normocephalic and atraumatic.     Nose: Nose normal.     Mouth/Throat:     Mouth: Mucous membranes are dry.  Eyes:     Conjunctiva/sclera: Conjunctivae normal.     Pupils: Pupils are equal, round, and reactive to light.  Cardiovascular:     Rate and Rhythm: Normal rate and regular rhythm.     Pulses: Normal pulses.     Heart sounds: Normal heart sounds. No murmur heard.   Pulmonary:     Effort: Pulmonary effort is normal. No respiratory distress.     Comments: Coarse breath sounds throughout Abdominal:     Palpations: Abdomen is soft.     Tenderness: There is no abdominal tenderness.  Genitourinary:    Comments: Stool on rectal exam is dark in appearance possibly melena Musculoskeletal:     Cervical back: Neck supple.  Skin:    General: Skin is warm and dry.     Comments: Overall superficial sacral ulcer  Neurological:  General: No focal deficit present.     Mental Status: He is alert.  Psychiatric:        Mood and Affect: Mood normal.     ED Results / Procedures / Treatments   Labs (all labs ordered are  listed, but only abnormal results are displayed) Labs Reviewed  COMPREHENSIVE METABOLIC PANEL - Abnormal; Notable for the following components:      Result Value   Sodium 128 (*)    Chloride 92 (*)    CO2 19 (*)    BUN 148 (*)    Creatinine, Ser 4.48 (*)    Calcium 7.8 (*)    Total Protein 6.1 (*)    Albumin 2.0 (*)    GFR calc non Af Amer 12 (*)    GFR calc Af Amer 14 (*)    Anion gap 17 (*)    All other components within normal limits  CBC WITH DIFFERENTIAL/PLATELET - Abnormal; Notable for the following components:   WBC 20.7 (*)    RBC 2.33 (*)    Hemoglobin 7.1 (*)    HCT 22.3 (*)    RDW 18.8 (*)    Neutro Abs 19.0 (*)    Lymphs Abs 0.5 (*)    Abs Immature Granulocytes 0.20 (*)    All other components within normal limits  POC OCCULT BLOOD, ED - Abnormal; Notable for the following components:   Fecal Occult Bld POSITIVE (*)    All other components within normal limits  URINE CULTURE  CULTURE, BLOOD (ROUTINE X 2)  CULTURE, BLOOD (ROUTINE X 2)  RESPIRATORY PANEL BY RT PCR (FLU A&B, COVID)  LACTIC ACID, PLASMA  LACTIC ACID, PLASMA  URINALYSIS, ROUTINE W REFLEX MICROSCOPIC  PROTIME-INR  APTT  CBG MONITORING, ED  TYPE AND SCREEN    EKG EKG Interpretation  Date/Time:  Friday September 12 2020 08:01:33 EDT Ventricular Rate:  83 PR Interval:    QRS Duration: 136 QT Interval:  389 QTC Calculation: 458 R Axis:   77 Text Interpretation: Sinus rhythm Nonspecific intraventricular conduction delay Inferior infarct, old Probable lateral infarct, age indeterminate Confirmed by Lennice Sites [phone]) on 08/28/2020 8:30:20 AM   Radiology DG Chest Port 1 View  Result Date: 09/03/2020 CLINICAL DATA:  Sepsis, wheezing. EXAM: PORTABLE CHEST 1 VIEW COMPARISON:  September 01, 2017. FINDINGS: The heart size and mediastinal contours are within normal limits. No pneumothorax or pleural effusion is noted. Left lung is clear. New irregular density is noted in the right upper lobe  which may represent pneumonia, but malignancy cannot be excluded. The visualized skeletal structures are unremarkable. IMPRESSION: New irregular density seen in right upper lobe which may represent pneumonia, but malignancy cannot be excluded. CT scan of the chest is recommended for further evaluation. Electronically Signed   By: Marijo Conception M.D.   On: 09/12/2020 08:53    Procedures .Critical Care Performed by: Lennice Sites, DO Authorized by: Lennice Sites, DO   Critical care provider statement:    Critical care time (minutes):  35   Critical care was necessary to treat or prevent imminent or life-threatening deterioration of the following conditions:  Circulatory failure and sepsis   Critical care was time spent personally by me on the following activities:  Blood draw for specimens, discussions with consultants, discussions with primary provider, evaluation of patient's response to treatment, examination of patient, obtaining history from patient or surrogate, ordering and performing treatments and interventions, ordering and review of laboratory studies, ordering and review of radiographic  studies, pulse oximetry, re-evaluation of patient's condition and review of old charts   I assumed direction of critical care for this patient from another provider in my specialty: no     (including critical care time)  Medications Ordered in ED Medications  azithromycin (ZITHROMAX) 500 mg in sodium chloride 0.9 % 250 mL IVPB (500 mg Intravenous New Bag/Given 09/14/2020 0922)  cefTRIAXone (ROCEPHIN) 1 g in sodium chloride 0.9 % 100 mL IVPB (1 g Intravenous New Bag/Given 09/12/2020 0924)  lactated ringers bolus 1,000 mL (1,000 mLs Intravenous New Bag/Given 09/12/20 1700)    ED Course  I have reviewed the triage vital signs and the nursing notes.  Pertinent labs & imaging results that were available during my care of the patient were reviewed by me and considered in my medical decision making (see chart  for details).    MDM Rules/Calculators/A&P                          Vanderbilt Ranieri is a 74 year old male with history of high cholesterol, hypertension, CAD, COPD, heart failure who presents to the ED with concern for infectious process, failure to thrive.  Patient with low blood pressure of 88/55 upon my evaluation.  Mildly hypothermic.  However no tachycardia, on room air.  History is limited due to what likely is some baseline dementia.  Patient is cachectic, unwell appearing.  Has what appears to be dark stool on exam and when he soiled himself.  Has a small sacral ulcer.  He denies any specific pain.  It sounds like he lives at home and has some support with home health but could possibly just be friends and family.  Supposedly her daughter is underway.  I tried to call phone number on chart but unable to reach daughter.  He does not appear to be on blood thinner but he is on Plavix. Suspect dehydration versus sepsis versus GI bleed.  Will obtain more history from family when they arrive.    Hemoglobin is 7.1.  Significantly down from last hemoglobin in our system.  Hemoccult is positive.  Suspect that likely there could be a GI bleed given that he has melanotic type stool on exam.  White count is also 21.  Chest x-ray concerning for pneumonia and will start antibiotics.  Lactic acid is normal.  Blood pressure has improved with IV fluids.  Overall suspect GI bleed, could also be infection/sepsis involved as well.  Awaiting remaining lab work and will admit to medicine.  Will touch base with GI about concern for GI bleed.  Patient is DNR.  Patient also with AKI.  Creatinine of 4.48.  BUN of 148.  Sodium 128.  Overall given BUN, Hemoccult, melena suspect GI bleed.  We will get a CT scan noncontrast to further evaluate for infectious process versus mass.  Carl Best with [facility] has been consulted and will evaluate patient as well.  To be admitted to hospitalist.  After admitted to  hospitalist. It appeared that CT scan showed pneumoperitoneum. Suspected ruptured bowel. Patient to be transition to comfort care with hospitalist.  This chart was dictated using voice recognition software.  Despite best efforts to proofread,  errors can occur which can change the documentation meaning.     Final Clinical Impression(s) / ED Diagnoses Final diagnoses:  Gastrointestinal hemorrhage, unspecified gastrointestinal hemorrhage type  AKI (acute kidney injury) ([place])    Rx / DC Orders ED Discharge Orders  None       Lennice Sites, DO 08/21/2020 [id number]    Lennice Sites, DO 09/16/2020 [id number]    Lennice Sites, DO 09/03/2020 1159

## 2020-09-13 DIAGNOSIS — N183 Chronic kidney disease, stage 3 unspecified: Secondary | ICD-10-CM

## 2020-09-17 LAB — CULTURE, BLOOD (ROUTINE X 2)
Culture: NO GROWTH
Culture: NO GROWTH
Special Requests: ADEQUATE
Special Requests: ADEQUATE

## 2020-09-19 NOTE — Death Summary Note (Signed)
DEATH SUMMARY   Patient Details  Name: Douglas Mann MRN: [id number] DOB: [birth year]  Admission/Discharge Information   Admit Date:  Sep 26, 2020  Date of Death: Date of Death: 2020-09-27  Time of Death: Time of Death: 0726  Length of Stay: 1  Referring Physician: [name], MD   Reason(s) for Hospitalization   Abdominal pain with failure to thrive   Diagnoses  Preliminary cause of death: Bowel perforation ([place])   Secondary Diagnoses (including complications and co-morbidities):    Bowel perforation with pneumoperitoneum on the imaging  Upper gastrointestinal bleed  Acute blood loss anemia on chronic anemia   Acute kidney injury on CKD stage IIIb  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  Patient reported abdominal pain for " a pretty good while".  Patient was essentially bedbound and was changed by his neighbors and daughter.  He had not left his house in about 3 years, lives alone.  Per daughter he had stomach issues for several years.   In ED, BUN 150, creatinine 4.4, melanotic stools, FOBT positive, hemoglobin 7.1, WBC count 20.7 CT chest showed scattered pneumoperitoneum in the upper abdomen compatible with ruptured bowel, 6.5 cm confluent cavitary lesion in the right upper lobe inseparable from the hilum, indeterminate for cavitary tumor versus necrotizing  pneumonia, multiple additional partially cavitary bilateral lung nodules suggesting septic emboli.  Bowel perforation with upper GI bleed, acute blood loss anemia superimposed on chronic anemia, multisystem organ failure -General surgery and GI was consulted.  Dr. Lorin Mercy, admitting physician discussed with patient's daughter after the CT results were available, multisystem organ failure in conjunction with overall poor functional status, daughter requested comfort care status as would be her father's wishes. -GI was consulted, recommended transition to comfort care and no plans for endoscopy evaluation Palliative medicine was consulted and patient was admitted for end-of-life care   Acute kidney injury on CKD, stage IIIb -Patient was transitioned to comfort care status with end-of-life care   severe COPD, chronic respiratory failure on 4 L, upper right lung mass tumor versus necrotizing pneumonia -Incidentally seen on CT chest, patient was placed on comfort care status  Patient passed on 27-Sep-2020 at 7:26 AM.  I had not seen the patient prior to his death.   Pertinent Labs and Studies  Significant Diagnostic Studies CT CHEST WO CONTRAST  Addendum Date: 09/26/2020   ADDENDUM REPORT: 09/26/20 10:46 ADDENDUM: Critical Value/emergent results were called by telephone at the time of interpretation on Sep 26, 2020 at at 1042 hours to Dr. Karmen Bongo who verbally acknowledged these results. Electronically Signed   By: Genevie Ann M.D.   On: 2020/09/26 10:46   Result Date: September 26, 2020 CLINICAL DATA:  75 year old male with sepsis. Renal insufficiency, not on dialysis. EXAM: CT CHEST WITHOUT CONTRAST TECHNIQUE: Multidetector CT imaging of the chest was performed following the standard protocol without IV contrast. COMPARISON:  Portable chest this  morning, 09/01/2017 and earlier. CT Abdomen and Pelvis 08/13/2014. FINDINGS: Cardiovascular: Calcified aortic atherosclerosis. Calcified coronary artery  atherosclerosis and/or stents. No cardiomegaly or pericardial effusion. Vascular patency is not evaluated in the absence of IV contrast. Mediastinum/Nodes: Small calcified right hilar lymph nodes. No mediastinal lymphadenopathy. Lungs/Pleura: Thick-walled, elongated and mildly lobulated cavitary mass adjacent to the right hilum, the most confluent portion of which is about 6.5 cm, is new since 2018. There is multifocal surrounding patchy and irregular additional right upper lobe opacity. There is a smaller 11 mm thin walled cavity in the right upper lobe along the minor fissure, and multiple additional right middle lobe and lower lobe small lung nodules, 1 of which demonstrates early cavitation on series 4, image 99. Multiple left lung nodules also, 2 with minimal cavitation. Additional patchy and spiculated larger peribronchial opacities in the lingula (series 4, image 105, and left lung apex (image 43) also partially cavitary. Retained secretions in the bilateral mainstem bronchi. Lung base bronchiectasis. Centrilobular emphysema. No pneumothorax or pleural effusion. Upper Abdomen: Chronic splenic calcified granulomas. Scattered small foci of free air in the upper abdomen, including along the liver contour falciform ligament, near the gallbladder fossa. Partially visible gallbladder distension and cholelithiasis. No convincing pericholecystic inflammation on these images. No dilated bowel in the upper abdomen. Musculoskeletal: Osteopenia. Occasional chronic rib fractures. No acute or suspicious osseous lesion identified. IMPRESSION: 1. Scattered pneumoperitoneum in the upper abdomen compatible with ruptured bowel. Recommend Surgery consultation and follow-up CT Abdomen and Pelvis. 2. A 6.5 cm confluent cavitary lesion in the right upper lobe inseparable from the hilum is indeterminate for cavitary tumor (such as squamous cell carcinoma) versus necrotizing pneumonia. But there are definite infectious appearing  changes surrounding the lesion and in the posterior right upper lobe. Furthermore, there are multiple additional partially cavitary bilateral lung nodules suggesting Septic Emboli. No pleural effusion.  No mediastinal lymphadenopathy. Underlying Bronchiectasis and Emphysema (ICD10-J43.9). 3. Calcified coronary artery and Aortic Atherosclerosis (ICD10-I70.0). Dr. Karmen Bongo has been paged regarding the critical findings on this exam as of 1031 hours. Electronically Signed: By: Genevie Ann M.D. On: 08/27/2020 10:35   DG Chest Port 1 View  Result Date: 08/23/2020 CLINICAL DATA:  Sepsis, wheezing. EXAM: PORTABLE CHEST 1 VIEW COMPARISON:  September 01, 2017. FINDINGS: The heart size and mediastinal contours are within normal limits. No pneumothorax or pleural effusion is noted. Left lung is clear. New irregular density is noted in the right upper lobe which may represent pneumonia, but malignancy cannot be excluded. The visualized skeletal structures are unremarkable. IMPRESSION: New irregular density seen in right upper lobe which may represent pneumonia, but malignancy cannot be excluded. CT scan of the chest is recommended for further evaluation. Electronically Signed   By: Marijo Conception M.D.   On: 09/07/2020 08:53    Microbiology Recent Results (from the past 240 hour(s))  Respiratory Panel by RT PCR (Flu A&B, Covid) - Nasopharyngeal Swab     Status: None   Collection Time: 08/25/2020  8:31 AM   Specimen: Nasopharyngeal Swab  Result Value Ref Range Status   SARS Coronavirus 2 by RT PCR NEGATIVE NEGATIVE Final    Comment: (NOTE) SARS-CoV-2 target nucleic acids are NOT DETECTED.  The SARS-CoV-2 RNA is generally detectable in upper respiratoy specimens during the acute phase of infection. The lowest concentration of SARS-CoV-2 viral copies this assay can detect is 131 copies/mL. A negative result does not preclude SARS-Cov-2 infection and should not be used as the sole basis for  treatment or other  patient management decisions. A negative result may occur with  improper specimen collection/handling, submission of specimen other than nasopharyngeal swab, presence of viral mutation(s) within the areas targeted by this assay, and inadequate number of viral copies (<131 copies/mL). A negative result must be combined with clinical observations, patient history, and epidemiological information. The expected result is Negative.  Fact Sheet for Patients:  PinkCheek.be  Fact Sheet for Healthcare Providers:  GravelBags.it  This test is no t yet approved or cleared by the [place] FDA and  has been authorized for detection and/or diagnosis of SARS-CoV-2 by FDA under an Emergency Use Authorization (EUA). This EUA will remain  in effect (meaning this test can be used) for the duration of the COVID-19 declaration under Section 564(b)(1) of the Act, 21 U.S.C. section 360bbb-3(b)(1), unless the authorization is terminated or revoked sooner.     Influenza A by PCR NEGATIVE NEGATIVE Final   Influenza B by PCR NEGATIVE NEGATIVE Final    Comment: (NOTE) The Xpert Xpress SARS-CoV-2/FLU/RSV assay is intended as an aid in  the diagnosis of influenza from Nasopharyngeal swab specimens and  should not be used as a sole basis for treatment. Nasal washings and  aspirates are unacceptable for Xpert Xpress SARS-CoV-2/FLU/RSV  testing.  Fact Sheet for Patients: PinkCheek.be  Fact Sheet for Healthcare Providers: GravelBags.it  This test is not yet approved or cleared by the [place] FDA and  has been authorized for detection and/or diagnosis of SARS-CoV-2 by  FDA under an Emergency Use Authorization (EUA). This EUA will remain  in effect (meaning this test can be used) for the duration of the  Covid-19 declaration under Section 564(b)(1) of the Act, 21  U.S.C. section  360bbb-3(b)(1), unless the authorization is  terminated or revoked. Performed at [facility], [place] [address], [place], [place] [address]     Lab Basic Metabolic Panel: Recent Labs  Lab 09/03/2020 0815  NA 128*  K 3.8  CL 92*  CO2 19*  GLUCOSE 84  BUN 148*  CREATININE 4.48*  CALCIUM 7.8*   Liver Function Tests: Recent Labs  Lab 09/16/2020 0815  AST 21  ALT 17  ALKPHOS 64  BILITOT 1.2  PROT 6.1*  ALBUMIN 2.0*   No results for input(s): LIPASE, AMYLASE in the last 168 hours. No results for input(s): AMMONIA in the last 168 hours. CBC: Recent Labs  Lab 08/24/2020 0815  WBC 20.7*  NEUTROABS 19.0*  HGB 7.1*  HCT 22.3*  MCV 95.7  PLT 263   Cardiac Enzymes: No results for input(s): CKTOTAL, CKMB, CKMBINDEX, TROPONINI in the last 168 hours. Sepsis Labs: Recent Labs  Lab 09/02/2020 0815  WBC 20.7*  LATICACIDVEN 1.7    Procedures/Operations  None    Kenyen Candy 09-26-2020, 9:52 AM

## 2020-09-19 NOTE — Progress Notes (Signed)
Pt found breathless,pulseless,and unresponsive. Death pronounced at 22. Dr. Tana Coast notified via text page in [place].  Pt's daughter, Douglas Mann, notified verbally over the phone. She thanked staff for their care.

## 2020-09-19 NOTE — Progress Notes (Signed)
90 cc's of Morphine drip wasted in stericycle. Witnessed by [employer].

## 2020-09-19 DEATH — deceased

## 2020-11-04 ENCOUNTER — Telehealth: Payer: Medicare HMO
